# Patient Record
Sex: Female | Born: 1955 | Race: White | Hispanic: No | Smoking: Never smoker
Health system: Southern US, Community
[De-identification: ages and names within clinical notes are randomized; demographics above are authoritative.]

## PROBLEM LIST (undated history)

## (undated) DIAGNOSIS — G709 Myoneural disorder, unspecified: Secondary | ICD-10-CM

## (undated) DIAGNOSIS — H02409 Unspecified ptosis of unspecified eyelid: Secondary | ICD-10-CM

## (undated) DIAGNOSIS — M797 Fibromyalgia: Secondary | ICD-10-CM

## (undated) DIAGNOSIS — H47011 Ischemic optic neuropathy, right eye: Secondary | ICD-10-CM

## (undated) DIAGNOSIS — M5412 Radiculopathy, cervical region: Secondary | ICD-10-CM

## (undated) DIAGNOSIS — G7001 Myasthenia gravis with (acute) exacerbation: Secondary | ICD-10-CM

## (undated) DIAGNOSIS — K222 Esophageal obstruction: Secondary | ICD-10-CM

## (undated) DIAGNOSIS — M1990 Unspecified osteoarthritis, unspecified site: Secondary | ICD-10-CM

## (undated) DIAGNOSIS — F419 Anxiety disorder, unspecified: Secondary | ICD-10-CM

## (undated) DIAGNOSIS — H469 Unspecified optic neuritis: Secondary | ICD-10-CM

## (undated) DIAGNOSIS — G629 Polyneuropathy, unspecified: Secondary | ICD-10-CM

## (undated) DIAGNOSIS — D0510 Intraductal carcinoma in situ of unspecified breast: Secondary | ICD-10-CM

## (undated) DIAGNOSIS — M545 Low back pain, unspecified: Secondary | ICD-10-CM

## (undated) DIAGNOSIS — Z9289 Personal history of other medical treatment: Secondary | ICD-10-CM

## (undated) DIAGNOSIS — B159 Hepatitis A without hepatic coma: Secondary | ICD-10-CM

## (undated) DIAGNOSIS — R112 Nausea with vomiting, unspecified: Secondary | ICD-10-CM

## (undated) DIAGNOSIS — G43909 Migraine, unspecified, not intractable, without status migrainosus: Secondary | ICD-10-CM

## (undated) DIAGNOSIS — G4730 Sleep apnea, unspecified: Secondary | ICD-10-CM

## (undated) DIAGNOSIS — M4800 Spinal stenosis, site unspecified: Secondary | ICD-10-CM

## (undated) DIAGNOSIS — K219 Gastro-esophageal reflux disease without esophagitis: Secondary | ICD-10-CM

## (undated) DIAGNOSIS — R55 Syncope and collapse: Secondary | ICD-10-CM

## (undated) DIAGNOSIS — G8929 Other chronic pain: Secondary | ICD-10-CM

## (undated) DIAGNOSIS — Z9889 Other specified postprocedural states: Secondary | ICD-10-CM

## (undated) DIAGNOSIS — G7000 Myasthenia gravis without (acute) exacerbation: Secondary | ICD-10-CM

## (undated) HISTORY — DX: Fibromyalgia: M79.7

## (undated) HISTORY — DX: Unspecified optic neuritis: H46.9

## (undated) HISTORY — PX: ESOPHAGOGASTRODUODENOSCOPY (EGD) WITH ESOPHAGEAL DILATION: SHX5812

## (undated) HISTORY — PX: AUGMENTATION MAMMAPLASTY: SUR837

## (undated) HISTORY — DX: Myasthenia gravis with (acute) exacerbation: G70.01

## (undated) HISTORY — PX: CARPAL TUNNEL RELEASE: SHX101

## (undated) HISTORY — DX: Personal history of other medical treatment: Z92.89

## (undated) HISTORY — DX: Myoneural disorder, unspecified: G70.9

## (undated) HISTORY — DX: Esophageal obstruction: K22.2

## (undated) HISTORY — DX: Syncope and collapse: R55

## (undated) HISTORY — DX: Radiculopathy, cervical region: M54.12

## (undated) HISTORY — PX: BREAST BIOPSY: SHX20

## (undated) HISTORY — DX: Intraductal carcinoma in situ of unspecified breast: D05.10

## (undated) HISTORY — DX: Unspecified ptosis of unspecified eyelid: H02.409

## (undated) HISTORY — PX: BACK SURGERY: SHX140

## (undated) HISTORY — PX: MASTECTOMY: SHX3

---
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
## 1993-09-07 HISTORY — PX: INNER EAR SURGERY: SHX679

## 1997-09-07 HISTORY — PX: LUMBAR LAMINECTOMY: SHX95

## 1998-09-07 HISTORY — PX: LUMBAR LAMINECTOMY: SHX95

## 1998-11-12 ENCOUNTER — Other Ambulatory Visit: Admission: RE | Admit: 1998-11-12 | Discharge: 1998-11-12 | Payer: Self-pay | Admitting: Family Medicine

## 1998-12-26 ENCOUNTER — Ambulatory Visit (HOSPITAL_BASED_OUTPATIENT_CLINIC_OR_DEPARTMENT_OTHER): Admission: RE | Admit: 1998-12-26 | Discharge: 1998-12-26 | Payer: Self-pay

## 1999-02-21 ENCOUNTER — Inpatient Hospital Stay (HOSPITAL_COMMUNITY): Admission: RE | Admit: 1999-02-21 | Discharge: 1999-02-24 | Payer: Self-pay

## 1999-03-01 ENCOUNTER — Encounter: Payer: Self-pay | Admitting: Plastic Surgery

## 1999-03-01 ENCOUNTER — Emergency Department (HOSPITAL_COMMUNITY): Admission: EM | Admit: 1999-03-01 | Discharge: 1999-03-01 | Payer: Self-pay

## 1999-03-24 ENCOUNTER — Encounter: Admission: RE | Admit: 1999-03-24 | Discharge: 1999-04-25 | Payer: Self-pay | Admitting: Plastic Surgery

## 1999-05-30 ENCOUNTER — Ambulatory Visit (HOSPITAL_COMMUNITY): Admission: RE | Admit: 1999-05-30 | Discharge: 1999-05-30 | Payer: Self-pay | Admitting: Neurosurgery

## 1999-05-30 ENCOUNTER — Encounter: Payer: Self-pay | Admitting: Neurosurgery

## 1999-06-17 ENCOUNTER — Inpatient Hospital Stay (HOSPITAL_COMMUNITY): Admission: RE | Admit: 1999-06-17 | Discharge: 1999-06-18 | Payer: Self-pay | Admitting: Neurosurgery

## 1999-06-17 ENCOUNTER — Encounter: Payer: Self-pay | Admitting: Neurosurgery

## 1999-07-23 ENCOUNTER — Ambulatory Visit (HOSPITAL_BASED_OUTPATIENT_CLINIC_OR_DEPARTMENT_OTHER): Admission: RE | Admit: 1999-07-23 | Discharge: 1999-07-24 | Payer: Self-pay | Admitting: Plastic Surgery

## 1999-07-23 ENCOUNTER — Encounter (INDEPENDENT_AMBULATORY_CARE_PROVIDER_SITE_OTHER): Payer: Self-pay | Admitting: *Deleted

## 1999-11-25 ENCOUNTER — Other Ambulatory Visit: Admission: RE | Admit: 1999-11-25 | Discharge: 1999-11-25 | Payer: Self-pay | Admitting: Family Medicine

## 2000-01-29 ENCOUNTER — Encounter: Admission: RE | Admit: 2000-01-29 | Discharge: 2000-02-26 | Payer: Self-pay | Admitting: Neurosurgery

## 2000-08-05 ENCOUNTER — Encounter: Payer: Self-pay | Admitting: Family Medicine

## 2000-08-05 ENCOUNTER — Encounter: Admission: RE | Admit: 2000-08-05 | Discharge: 2000-08-05 | Payer: Self-pay | Admitting: Family Medicine

## 2000-08-16 ENCOUNTER — Encounter: Admission: RE | Admit: 2000-08-16 | Discharge: 2000-11-14 | Payer: Self-pay | Admitting: Emergency Medicine

## 2000-09-22 ENCOUNTER — Encounter: Payer: Self-pay | Admitting: Family Medicine

## 2000-09-22 ENCOUNTER — Encounter: Admission: RE | Admit: 2000-09-22 | Discharge: 2000-09-22 | Payer: Self-pay | Admitting: Family Medicine

## 2000-12-01 ENCOUNTER — Other Ambulatory Visit: Admission: RE | Admit: 2000-12-01 | Discharge: 2000-12-01 | Payer: Self-pay | Admitting: *Deleted

## 2001-01-18 ENCOUNTER — Ambulatory Visit (HOSPITAL_BASED_OUTPATIENT_CLINIC_OR_DEPARTMENT_OTHER): Admission: RE | Admit: 2001-01-18 | Discharge: 2001-01-18 | Payer: Self-pay | Admitting: Internal Medicine

## 2001-05-11 ENCOUNTER — Encounter: Admission: RE | Admit: 2001-05-11 | Discharge: 2001-06-21 | Payer: Self-pay | Admitting: Neurological Surgery

## 2001-12-02 ENCOUNTER — Other Ambulatory Visit: Admission: RE | Admit: 2001-12-02 | Discharge: 2001-12-02 | Payer: Self-pay | Admitting: *Deleted

## 2002-09-07 DIAGNOSIS — Z9289 Personal history of other medical treatment: Secondary | ICD-10-CM

## 2002-09-07 HISTORY — DX: Personal history of other medical treatment: Z92.89

## 2002-09-15 ENCOUNTER — Other Ambulatory Visit: Admission: RE | Admit: 2002-09-15 | Discharge: 2002-09-15 | Payer: Self-pay | Admitting: *Deleted

## 2003-02-19 ENCOUNTER — Emergency Department (HOSPITAL_COMMUNITY): Admission: EM | Admit: 2003-02-19 | Discharge: 2003-02-19 | Payer: Self-pay | Admitting: Emergency Medicine

## 2003-02-19 ENCOUNTER — Encounter: Payer: Self-pay | Admitting: Emergency Medicine

## 2003-09-28 ENCOUNTER — Other Ambulatory Visit: Admission: RE | Admit: 2003-09-28 | Discharge: 2003-09-28 | Payer: Self-pay | Admitting: *Deleted

## 2003-11-09 ENCOUNTER — Ambulatory Visit (HOSPITAL_COMMUNITY): Admission: RE | Admit: 2003-11-09 | Discharge: 2003-11-09 | Payer: Self-pay | Admitting: Podiatry

## 2004-05-21 ENCOUNTER — Ambulatory Visit (HOSPITAL_COMMUNITY): Admission: RE | Admit: 2004-05-21 | Discharge: 2004-05-21 | Payer: Self-pay | Admitting: Rheumatology

## 2004-07-22 ENCOUNTER — Emergency Department (HOSPITAL_COMMUNITY): Admission: EM | Admit: 2004-07-22 | Discharge: 2004-07-22 | Payer: Self-pay | Admitting: Emergency Medicine

## 2004-09-07 DIAGNOSIS — Z9289 Personal history of other medical treatment: Secondary | ICD-10-CM

## 2004-09-07 HISTORY — DX: Personal history of other medical treatment: Z92.89

## 2004-10-08 ENCOUNTER — Other Ambulatory Visit: Admission: RE | Admit: 2004-10-08 | Discharge: 2004-10-08 | Payer: Self-pay | Admitting: *Deleted

## 2005-05-06 ENCOUNTER — Emergency Department (HOSPITAL_COMMUNITY): Admission: EM | Admit: 2005-05-06 | Discharge: 2005-05-06 | Payer: Self-pay | Admitting: Emergency Medicine

## 2005-08-10 ENCOUNTER — Emergency Department (HOSPITAL_COMMUNITY): Admission: EM | Admit: 2005-08-10 | Discharge: 2005-08-10 | Payer: Self-pay | Admitting: Emergency Medicine

## 2005-12-03 ENCOUNTER — Ambulatory Visit (HOSPITAL_COMMUNITY): Admission: RE | Admit: 2005-12-03 | Discharge: 2005-12-03 | Payer: Self-pay | Admitting: Gastroenterology

## 2005-12-03 ENCOUNTER — Encounter: Payer: Self-pay | Admitting: Emergency Medicine

## 2005-12-03 ENCOUNTER — Ambulatory Visit: Payer: Self-pay | Admitting: Gastroenterology

## 2005-12-03 ENCOUNTER — Encounter (INDEPENDENT_AMBULATORY_CARE_PROVIDER_SITE_OTHER): Payer: Self-pay | Admitting: *Deleted

## 2005-12-23 ENCOUNTER — Ambulatory Visit: Payer: Self-pay | Admitting: Internal Medicine

## 2005-12-30 ENCOUNTER — Ambulatory Visit (HOSPITAL_COMMUNITY): Admission: RE | Admit: 2005-12-30 | Discharge: 2005-12-30 | Payer: Self-pay | Admitting: Internal Medicine

## 2006-12-21 ENCOUNTER — Emergency Department (HOSPITAL_COMMUNITY): Admission: EM | Admit: 2006-12-21 | Discharge: 2006-12-21 | Payer: Self-pay | Admitting: Emergency Medicine

## 2007-11-08 ENCOUNTER — Ambulatory Visit (HOSPITAL_BASED_OUTPATIENT_CLINIC_OR_DEPARTMENT_OTHER): Admission: RE | Admit: 2007-11-08 | Discharge: 2007-11-08 | Payer: Self-pay | Admitting: Orthopedic Surgery

## 2008-02-28 ENCOUNTER — Ambulatory Visit: Payer: Self-pay | Admitting: Internal Medicine

## 2008-03-18 ENCOUNTER — Emergency Department (HOSPITAL_COMMUNITY): Admission: EM | Admit: 2008-03-18 | Discharge: 2008-03-18 | Payer: Self-pay | Admitting: Emergency Medicine

## 2008-11-05 ENCOUNTER — Ambulatory Visit: Payer: Self-pay | Admitting: Internal Medicine

## 2008-11-13 ENCOUNTER — Telehealth: Payer: Self-pay | Admitting: Internal Medicine

## 2008-12-21 ENCOUNTER — Ambulatory Visit: Payer: Self-pay | Admitting: Internal Medicine

## 2008-12-25 ENCOUNTER — Encounter: Admission: RE | Admit: 2008-12-25 | Discharge: 2009-02-19 | Payer: Self-pay | Admitting: Neurosurgery

## 2009-01-21 ENCOUNTER — Encounter: Admission: RE | Admit: 2009-01-21 | Discharge: 2009-02-28 | Payer: Self-pay | Admitting: Emergency Medicine

## 2009-06-17 ENCOUNTER — Encounter: Admission: RE | Admit: 2009-06-17 | Discharge: 2009-07-15 | Payer: Self-pay | Admitting: Sports Medicine

## 2009-07-02 ENCOUNTER — Ambulatory Visit: Payer: Self-pay | Admitting: Genetic Counselor

## 2009-07-25 ENCOUNTER — Encounter: Admission: RE | Admit: 2009-07-25 | Discharge: 2009-07-25 | Payer: Self-pay | Admitting: Neurosurgery

## 2010-09-27 ENCOUNTER — Encounter: Payer: Self-pay | Admitting: Family Medicine

## 2010-09-28 ENCOUNTER — Encounter: Payer: Self-pay | Admitting: Podiatry

## 2010-12-10 ENCOUNTER — Other Ambulatory Visit: Payer: Self-pay | Admitting: Obstetrics and Gynecology

## 2010-12-29 ENCOUNTER — Emergency Department (HOSPITAL_COMMUNITY)
Admission: EM | Admit: 2010-12-29 | Discharge: 2010-12-29 | Disposition: A | Discharge status: Other Acute Inpatient Hospital | Payer: 59 | Attending: Emergency Medicine | Admitting: Emergency Medicine

## 2010-12-29 ENCOUNTER — Emergency Department (HOSPITAL_COMMUNITY): Payer: 59

## 2010-12-29 DIAGNOSIS — R1310 Dysphagia, unspecified: Secondary | ICD-10-CM

## 2010-12-29 DIAGNOSIS — R079 Chest pain, unspecified: Secondary | ICD-10-CM | POA: Insufficient documentation

## 2010-12-29 DIAGNOSIS — K222 Esophageal obstruction: Secondary | ICD-10-CM

## 2010-12-29 DIAGNOSIS — R1013 Epigastric pain: Secondary | ICD-10-CM | POA: Insufficient documentation

## 2010-12-29 DIAGNOSIS — Z853 Personal history of malignant neoplasm of breast: Secondary | ICD-10-CM | POA: Insufficient documentation

## 2010-12-29 LAB — COMPREHENSIVE METABOLIC PANEL
ALT: 13 U/L (ref 0–35)
AST: 17 U/L (ref 0–37)
Albumin: 3.6 g/dL (ref 3.5–5.2)
Alkaline Phosphatase: 39 U/L (ref 39–117)
BUN: 18 mg/dL (ref 6–23)
CO2: 24 mEq/L (ref 19–32)
Calcium: 9.1 mg/dL (ref 8.4–10.5)
Chloride: 105 mEq/L (ref 96–112)
Creatinine, Ser: 0.63 mg/dL (ref 0.4–1.2)
GFR calc Af Amer: >60 mL/min (ref >60)
GFR calc non Af Amer: >60 mL/min (ref >60)
Glucose, Bld: 90 mg/dL (ref 70–99)
Potassium: 3.6 mEq/L (ref 3.5–5.1)
Sodium: 137 mEq/L (ref 135–145)
Total Bilirubin: 0.5 mg/dL (ref 0.3–1.2)
Total Protein: 6.5 g/dL (ref 6.0–8.3)

## 2010-12-29 LAB — DIFFERENTIAL
Basophils Absolute: 0 10*3/uL (ref 0.0–0.1)
Basophils Relative: 1 % (ref 0–1)
Eosinophils Absolute: 0.2 10*3/uL (ref 0.0–0.7)
Eosinophils Relative: 4 % (ref 0–5)
Lymphocytes Relative: 35 % (ref 12–46)
Lymphs Abs: 2.2 10*3/uL (ref 0.7–4.0)
Monocytes Absolute: 0.8 10*3/uL (ref 0.1–1.0)
Monocytes Relative: 13 % — ABNORMAL HIGH (ref 3–12)
Neutro Abs: 3 10*3/uL (ref 1.7–7.7)
Neutrophils Relative %: 47 % (ref 43–77)

## 2010-12-29 LAB — CBC
HCT: 37.4 % (ref 36.0–46.0)
Hemoglobin: 12.6 g/dL (ref 12.0–15.0)
MCH: 29.9 pg (ref 26.0–34.0)
MCHC: 33.7 g/dL (ref 30.0–36.0)
MCV: 88.6 fL (ref 78.0–100.0)
Platelets: 242 10*3/uL (ref 150–400)
RBC: 4.22 MIL/uL (ref 3.87–5.11)
RDW: 12.3 % (ref 11.5–15.5)
WBC: 6.3 10*3/uL (ref 4.0–10.5)

## 2010-12-29 LAB — POCT CARDIAC MARKERS
CKMB, poc: <1 ng/mL — ABNORMAL LOW (ref 1.0–8.0)
Myoglobin, poc: 39.4 ng/mL (ref 12–200)
Troponin i, poc: <0.05 ng/mL (ref 0.00–0.09)

## 2010-12-29 LAB — LIPASE, BLOOD: Lipase: 32 U/L (ref 11–59)

## 2011-01-20 ENCOUNTER — Ambulatory Visit (HOSPITAL_COMMUNITY)
Admission: RE | Admit: 2011-01-20 | Discharge: 2011-01-20 | Disposition: A | Discharge status: Home / Self Care | Payer: 59 | Source: Ambulatory Visit | Attending: Obstetrics and Gynecology | Admitting: Obstetrics and Gynecology

## 2011-01-20 ENCOUNTER — Other Ambulatory Visit: Payer: Self-pay | Admitting: Obstetrics and Gynecology

## 2011-01-20 DIAGNOSIS — N950 Postmenopausal bleeding: Secondary | ICD-10-CM | POA: Insufficient documentation

## 2011-01-20 DIAGNOSIS — N841 Polyp of cervix uteri: Secondary | ICD-10-CM | POA: Insufficient documentation

## 2011-01-20 DIAGNOSIS — N840 Polyp of corpus uteri: Secondary | ICD-10-CM | POA: Insufficient documentation

## 2011-01-20 LAB — CBC
HCT: 38.5 % (ref 36.0–46.0)
Hemoglobin: 12.9 g/dL (ref 12.0–15.0)
MCV: 90.2 fL (ref 78.0–100.0)
Platelets: 221 10*3/uL (ref 150–400)
RBC: 4.27 MIL/uL (ref 3.87–5.11)
WBC: 7.4 10*3/uL (ref 4.0–10.5)

## 2011-01-20 NOTE — Op Note (Signed)
[name], Felicia Cain            ACCOUNT NO.:  [id number]   MEDICAL RECORD NO.:  [id number]          PATIENT TYPE:  AMB   LOCATION:  DSC                          FACILITY:  [facility]   PHYSICIAN:  Cindee Salt, M.D.       DATE OF BIRTH:  May 26, 1956   DATE OF PROCEDURE:  11/08/2007  DATE OF DISCHARGE:                               OPERATIVE REPORT   PREOPERATIVE DIAGNOSIS:  Carpal tunnel syndrome, right hand.   POSTOPERATIVE DIAGNOSIS:  Carpal tunnel syndrome, right hand.   OPERATION:  Decompression, right median nerve.   SURGEON:  Cindee Salt, M.D.   ASSISTANTCarolyne Fiscal, RN   ANESTHESIA:  Forearm based IV regional.   HISTORY:  The patient is a 56 year old female with a history of carpal  tunnel syndrome, EMG nerve conduction positive which has not responded  to conservative treatment.  She has elected to undergo release of the  median nerve. Preoperative and postoperative course been discussed.  Risks and complications are made aware to the patient.  She is aware  that there is no guarantee with the surgery, possibility of infection,  recurrence, injury to arteries, nerves, tendons, complete relief of  symptoms, dystrophy.  In the preoperative area the patient is seen,  questions again encouraged and answered.  The extremity marked by both  the patient and surgeon.  Antibiotic given.   PROCEDURE:  The patient is brought to the operating room where forearm-  based IV regional anesthetic was carried out without difficulty.  She  was prepped using DuraPrep, supine position, right arm free.  A  longitudinal incision was made in year palm, carried down through  subcutaneous tissue.  Bleeders were electrocauterized. Palmar fascia was  split, superficial palmar arch identified, the flexor tendon to the ring  and little finger identified to the ulna section.  A right-angle and  Sewall retractors were placed between skin and forearm fascia.  Fascia  was released for approximately 1.5 cm  proximal to the wrist crease under  direct vision.  The canal was explored.  Area compression of the nerve  was apparent.  No further lesions were identified.  The wound was  irrigated.  Skin closed with interrupted 5-0 Vicryl Rapide sutures.  A  sterile compressive dressing and splint with the fingers free were  applied.  The patient tolerated the procedure well was taken to the  recovery room for observation in satisfactory condition.  She will be  discharged home to return to the [facility] in 1 week on  [place] __________.           ______________________________  Cindee Salt, M.D.     GK/MEDQ  D:  11/08/2007  T:  11/08/2007  Job:  [id number]   cc:   Otilio Connors. Gerri Spore, M.D.
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
## 2011-01-24 NOTE — Op Note (Signed)
  [name], Felicia Cain            ACCOUNT NO.:  [id number]  MEDICAL RECORD NO.:  [id number]           PATIENT TYPE:  O  LOCATION:  [facility]                          [facility]  PHYSICIAN:  Maxie Better, M.D.DATE OF BIRTH:  10-21-1955  DATE OF PROCEDURE:  01/20/2011 DATE OF DISCHARGE:                              OPERATIVE REPORT   PREOPERATIVE DIAGNOSES:  Postmenopausal bleeding, endometrial mass.  POSTOPERATIVE DIAGNOSES:  Postmenopausal bleeding, endometrial and endocervical polyp.  PROCEDURES:  Diagnostic hysteroscopy, dilation and curettage, removal of endometrial polyp and then the endocervical polyp.  ANESTHESIA:  General, paracervical block.  SURGEON:  Maxie Better, MD  PROCEDURE:  Under adequate general anesthesia, the patient was placed in the dorsal lithotomy position.  Examination under anesthesia revealed anteverted uterus.  No adnexal masses could be appreciated.  The patient had voided prior to entering the room.  Therefore, the bladder was not catheterized.  She was sterilely prepped and draped in usual fashion. The bivalve speculum was placed in the vagina.  A single-tooth tenaculum was placed on the anterior lip of the cervix.  A 20 mL of 1% Nesacaine was injected paracervically at 3 and 9 o'clock position.  The cervix was serially dilated up to #23 [facility] dilator.  A diagnostic hysteroscope was introduced into the uterine cavity.  Both tubal ostia could be seen. There was a broad-based polypoid lesion noted in the anterior fundal area.  The hysteroscope was removed.  The cervix was then attempted to be serially dilated to #25 [facility] dilator.  A resectoscope was attempted to be inserted into the cavity, but could not traverse the external os. Decision was then made to reinsert the diagnostic hysteroscope and used a grasper and scissors, both of which through that site to try to remove this lesion.  In the interim time, the polyp forceps was  utilized and the polypoid lesion was removed in pieces.  The hysteroscope was then removed, the cavity was then gently curetted for scant amount of tissue. On removal of the hysteroscope, a polypoid lesion was noted in the cervical canal and this was also removed using the grasper.  The procedure was then terminated by removing all instruments.  Specimen was endometrial curettings and polyps sent to pathology.  Estimated blood loss was minimal.  Complication was none.  The patient tolerated the procedure well and was transferred to the recovery room in stable condition.     Maxie Better, M.D.     [place]/MEDQ  D:  01/20/2011  T:  01/21/2011  Job:  [id number]  Electronically Signed by Nena Jordan Elisama Thissen M.D. on 01/24/2011 06:48:56 PM

## 2011-05-28 ENCOUNTER — Telehealth: Payer: Self-pay | Admitting: Internal Medicine

## 2011-05-28 NOTE — Telephone Encounter (Signed)
Pt has had problems with dysphagia in the past. Most recently had and egd with dil in April 2012 with Dr. Christella Hartigan. Pt had a screening colon in April of 2010 with Dr. Juanda Chance.Pt is calling with c/o feeling full with very little food. She states that she gets this full sensation and then the food comes back up her throat to her mouth and she has to spit it out. States it has gotten worse this last week. States she has not experienced weight loss because she is eating frequent, small amounts.Offered pt an appt to see Dr. Mithra Goodell tomorrow at 1:45pm but she states she has another md appt at 2pm. Pt would like to know if Dr. Teaira Goodell could work her in on Tuesday 06/02/11. Dr. Rayni Goodell please advise.

## 2011-05-29 ENCOUNTER — Ambulatory Visit: Payer: 59 | Admitting: Internal Medicine

## 2011-05-29 NOTE — Telephone Encounter (Signed)
appt w/ amy tues (I'm supervising)

## 2011-05-29 NOTE — Telephone Encounter (Signed)
Pt scheduled to see Amy Esterwood 06/02/11@10 :30am, pt aware of appt date and time.

## 2011-06-02 ENCOUNTER — Ambulatory Visit: Payer: 59 | Admitting: Physician Assistant

## 2011-08-16 ENCOUNTER — Encounter: Payer: Self-pay | Admitting: *Deleted

## 2011-08-16 ENCOUNTER — Emergency Department (HOSPITAL_COMMUNITY)
Admission: EM | Admit: 2011-08-16 | Discharge: 2011-08-16 | Disposition: A | Discharge status: Home / Self Care | Payer: 59 | Attending: Emergency Medicine | Admitting: Emergency Medicine

## 2011-08-16 DIAGNOSIS — M21619 Bunion of unspecified foot: Secondary | ICD-10-CM | POA: Insufficient documentation

## 2011-08-16 DIAGNOSIS — M79676 Pain in unspecified toe(s): Secondary | ICD-10-CM

## 2011-08-16 DIAGNOSIS — M79609 Pain in unspecified limb: Secondary | ICD-10-CM | POA: Insufficient documentation

## 2011-08-16 HISTORY — DX: Unspecified optic neuritis: H46.9

## 2011-08-16 HISTORY — DX: Spinal stenosis, site unspecified: M48.00

## 2011-08-16 HISTORY — DX: Polyneuropathy, unspecified: G62.9

## 2011-08-16 MED ORDER — OXYCODONE-ACETAMINOPHEN 5-325 MG PO TABS: 1.0000 | ORAL_TABLET | Freq: Four times a day (QID) | ORAL | Status: AC | PRN

## 2011-08-16 MED ORDER — PREDNISONE 20 MG PO TABS: 40.0000 mg | ORAL_TABLET | Freq: Every day | ORAL | Status: AC

## 2011-08-16 MED ORDER — HYDROCODONE-ACETAMINOPHEN 5-325 MG PO TABS: ORAL_TABLET | ORAL | Status: DC

## 2011-08-16 NOTE — ED Provider Notes (Signed)
History     CSN: [id number] Arrival date & time: 08/16/2011 11:13 AM   First MD Initiated Contact with Patient 08/16/11 1123      No chief complaint on file.   (Consider location/radiation/quality/duration/timing/severity/associated sxs/prior treatment) HPI Comments: Patient with onset and worsening of L toe pain since several days ago. She denies injury. She is having trouble walking. She's been using Motrin at home. She seen a podiatrist and had negative x-rays done. She denies fever or redness streaking up her leg.   Patient is a 55 y.o. female presenting with toe pain. The history is provided by the patient.  Toe Pain This is a new problem. The current episode started in the past 7 days. The problem occurs constantly. The problem has been gradually worsening. Associated symptoms include arthralgias. Pertinent negatives include no fever, joint swelling, nausea, numbness or weakness. The symptoms are aggravated by bending and walking. She has tried NSAIDs for the symptoms. The treatment provided mild relief.    No past medical history on file.  No past surgical history on file.  No family history on file.  History  Substance Use Topics  . Smoking status: Not on file  . Smokeless tobacco: Not on file  . Alcohol Use: Not on file    OB History    No data available      Review of Systems  Constitutional: Negative for fever.  Gastrointestinal: Negative for nausea.  Musculoskeletal: Positive for arthralgias. Negative for joint swelling.  Skin: Negative for color change and wound.  Neurological: Negative for weakness and numbness.    Allergies  Review of patient's allergies indicates not on file.  Home Medications  No current outpatient prescriptions on file.  BP 123/66  Pulse 104  Temp(Src) 98.6 F (37 C) (Oral)  Resp 18  SpO2 99%  Physical Exam  Nursing note and vitals reviewed. Constitutional: She is oriented to person, place, and time. She appears  well-developed and well-nourished.  HENT:  Head: Normocephalic and atraumatic.  Eyes: Pupils are equal, round, and reactive to light.  Neck: Normal range of motion. Neck supple.  Musculoskeletal: She exhibits no edema and no tenderness.  Neurological: She is alert and oriented to person, place, and time.       Distal motor, sensation, and vascular intact. Patients with diffuse pain of left great toe, especially over the flexor surface. There is no swelling or significant erythema. Skin is warm to touch. Patient does have a bunion on the left lateral aspect of the MTP joint.  Skin: Skin is warm and dry. No erythema.  Psychiatric: She has a normal mood and affect. Her behavior is normal.    ED Course  Procedures (including critical care time)  Labs Reviewed - No data to display No results found.   1. Pain in toe     11:40 AM Patient seen and examined.   Patient was seen with Dr. Oletta Lamas. Orthopedic shoe provided by orthopedic tech. Patient prescribed pain medicine and steroids. I have personally spoken with Dr. Sherlean Foot who states that patient should go to the office tomorrow where her orthopedic doctor will be seeing patients. Patient informed and agrees with plan. MDM  Patient with toe pain. Do not suspect gout. Likely tendon related given exam. Orthopedic follow indicated.        Eustace Moore [place], [place] 08/16/11 [phone]

## 2011-08-16 NOTE — ED Notes (Signed)
Pt c/o left great toes pain, swelling, redness x5days; seen podiatrist Friday advised to increase motring to tid, ice, elevation and sleeve; states most treatment worsens pain;

## 2011-08-17 NOTE — ED Provider Notes (Signed)
I have personally performed and participated in all the services and procedures documented herein. I have reviewed the findings with the patient. Pt with tenderness primarily to first phalanx of right foot without obv swelling, erythema, areas of fluctuance.  Much more severe with movement of MTP joint consistent with gout or pseudogout.  Pt has both an orthopedist and a podiatrist.  Explained that MRI was not indicated at this time.  Instead continued treatment with NSAIDs, or in this case prednisone and stronger analgesics.  PAC discussed with her orthopedic group so that she could be rechecked in a timely fashion.    Felicia Cain. Giavanni Zeitlin, MD 08/17/11 1150

## 2011-10-01 ENCOUNTER — Ambulatory Visit: Payer: 59 | Admitting: Internal Medicine

## 2011-12-08 ENCOUNTER — Ambulatory Visit: Payer: BC Managed Care – PPO | Attending: Sports Medicine

## 2012-06-02 ENCOUNTER — Ambulatory Visit (INDEPENDENT_AMBULATORY_CARE_PROVIDER_SITE_OTHER): Payer: BC Managed Care – PPO | Admitting: Sports Medicine

## 2012-06-02 ENCOUNTER — Encounter: Payer: Self-pay | Admitting: Sports Medicine

## 2012-06-02 VITALS — BP 116/78 | HR 73 | Ht 66.0 in | Wt 150.0 lb

## 2012-06-02 DIAGNOSIS — S93409A Sprain of unspecified ligament of unspecified ankle, initial encounter: Secondary | ICD-10-CM

## 2012-06-02 DIAGNOSIS — M25579 Pain in unspecified ankle and joints of unspecified foot: Secondary | ICD-10-CM | POA: Insufficient documentation

## 2012-06-02 NOTE — Patient Instructions (Addendum)
It was good to see you today Make sure you get the x-rays from [facility] and drop them off for me to review Try to increase your Neurontin to 3 times daily if tolerable Use the body helix ankle wrap when walking See me again in 3 weeks. I will contact you after reviewing her x-rays. I may need to send you for repeat films, depending on what the original x-ray showed. Feel free to call me if you have any questions or concerns before your followup visit  Toe your mother happy birthday for me!
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
## 2012-06-06 NOTE — Addendum Note (Signed)
Addended by: Annita Brod on: 06/06/2012 03:09 PM   Modules accepted: Orders

## 2012-06-08 ENCOUNTER — Ambulatory Visit
Admission: RE | Admit: 2012-06-08 | Discharge: 2012-06-08 | Disposition: A | Discharge status: Home / Self Care | Payer: BC Managed Care – PPO | Source: Ambulatory Visit | Attending: Sports Medicine | Admitting: Sports Medicine

## 2012-06-08 ENCOUNTER — Telehealth: Payer: Self-pay | Admitting: Sports Medicine

## 2012-06-08 DIAGNOSIS — S93409A Sprain of unspecified ligament of unspecified ankle, initial encounter: Secondary | ICD-10-CM

## 2012-06-08 DIAGNOSIS — M25579 Pain in unspecified ankle and joints of unspecified foot: Secondary | ICD-10-CM

## 2012-06-08 NOTE — Telephone Encounter (Signed)
I reviewed the x-rays from [facility]. No obvious ankle fracture but dedicated foot x-rays were not done. Given her tenderness and deformity at the base of the fifth metatarsal I want to order a dedicated x-rays of her foot. I will order an AP, lateral, and oblique at [facility] imaging and will followup with her via telephone once I reviewed those films.

## 2012-06-21 ENCOUNTER — Ambulatory Visit (INDEPENDENT_AMBULATORY_CARE_PROVIDER_SITE_OTHER): Payer: BC Managed Care – PPO | Admitting: Sports Medicine

## 2012-06-21 VITALS — BP 100/60 | Ht 66.0 in | Wt 150.0 lb

## 2012-06-21 DIAGNOSIS — S93409A Sprain of unspecified ligament of unspecified ankle, initial encounter: Secondary | ICD-10-CM

## 2012-06-21 DIAGNOSIS — S838X9A Sprain of other specified parts of unspecified knee, initial encounter: Secondary | ICD-10-CM

## 2012-06-21 DIAGNOSIS — S86819A Strain of other muscle(s) and tendon(s) at lower leg level, unspecified leg, initial encounter: Secondary | ICD-10-CM

## 2012-06-21 DIAGNOSIS — S86319A Strain of muscle(s) and tendon(s) of peroneal muscle group at lower leg level, unspecified leg, initial encounter: Secondary | ICD-10-CM

## 2012-06-21 MED ORDER — NITROGLYCERIN 0.2 MG/HR TD PT24: MEDICATED_PATCH | TRANSDERMAL | Status: DC

## 2012-06-21 NOTE — Progress Notes (Signed)
  Subjective:    Patient ID: Felicia Cain, female    DOB: 24-Jul-1956, 56 y.o.   MRN: [id number]  HPI Patient comes in today for followup on her left ankle pain. I reviewed the x-rays from [facility]. No obvious ankle fracture. I also ordered x-rays of her left foot which showed no new avulsion fracture. She still having pain along the anterior and lateral aspect of the ankle. Some swelling at the end of the day. Her inversion injury occurred one month ago. She's wearing her body helix ankle sleeve and this does help some. She notes that when she wears a shoe with an elevated heel that this helped her pain tremendously. She was unable to tolerate increasing her Neurontin to 3 times daily. She is now back to taking it twice a day.     Review of Systems     Objective:   Physical Exam Well-developed, well-nourished. No acute distress. Left ankle: She has full range of motion with no obvious ankle effusion. No significant soft tissue swelling. She is diffusely tender to palpation along the course of the peroneal tendons both the hind the distal fibula as well as at the insertion onto the base of the fifth metatarsal. She has pain with foot eversion and with stretching of the peroneal tendon. She is tender to palpation at the ATF. Still with a positive anterior drawer and a 2+ talar tilt. She is neurovascular intact distally and walking with a limp.  [facility] ultrasound of the left ankle: Images were obtained both in the transverse and longitudinal views. There is fluid in the tendon sheaths of both the peroneal longus and brevis tendons, a little more in the longus. There is bony  irregularity at the base of the fifth metatarsal and there may be a small tear in the peroneal brevis tendon just proximal to its insertion. She has increased neovascularity throughout the tendons.       Assessment & Plan:  1. Left ankle pain secondary to ankle sprain with probable strain/partial tear of the peroneal  tendons  We will try a trial of topical nitroglycerin. She will apply a quarter patch daily. She will start eccentric exercises. I've also given her a prescription for formal physical therapy for her use at her discretion. She will continue with the body helix compression sleeve and followup with me in 4 weeks. We will repeat her ultrasound at that time. Since she does get some relief with heel elevation, I've given her a pair of sports insoles with bilateral heel lifts to wear in her shoes. At this point in time I think we can hold on further diagnostic image but if symptoms persist or worsen we may need to reconsider that.

## 2012-06-23 ENCOUNTER — Ambulatory Visit: Payer: 59 | Admitting: Internal Medicine

## 2012-06-23 ENCOUNTER — Ambulatory Visit: Payer: BC Managed Care – PPO | Admitting: Sports Medicine

## 2012-06-23 ENCOUNTER — Ambulatory Visit (INDEPENDENT_AMBULATORY_CARE_PROVIDER_SITE_OTHER): Payer: BC Managed Care – PPO | Admitting: Internal Medicine

## 2012-06-23 ENCOUNTER — Encounter: Payer: Self-pay | Admitting: Internal Medicine

## 2012-06-23 VITALS — BP 110/68 | HR 71 | Temp 97.5°F | Resp 16 | Ht 66.0 in | Wt 152.0 lb

## 2012-06-23 DIAGNOSIS — Z9013 Acquired absence of bilateral breasts and nipples: Secondary | ICD-10-CM

## 2012-06-23 DIAGNOSIS — K219 Gastro-esophageal reflux disease without esophagitis: Secondary | ICD-10-CM

## 2012-06-23 DIAGNOSIS — Z9889 Other specified postprocedural states: Secondary | ICD-10-CM

## 2012-06-23 DIAGNOSIS — D0510 Intraductal carcinoma in situ of unspecified breast: Secondary | ICD-10-CM

## 2012-06-23 DIAGNOSIS — L719 Rosacea, unspecified: Secondary | ICD-10-CM

## 2012-06-23 DIAGNOSIS — E559 Vitamin D deficiency, unspecified: Secondary | ICD-10-CM

## 2012-06-23 DIAGNOSIS — Z9010 Acquired absence of unspecified breast and nipple: Secondary | ICD-10-CM

## 2012-06-23 DIAGNOSIS — C50919 Malignant neoplasm of unspecified site of unspecified female breast: Secondary | ICD-10-CM

## 2012-06-23 DIAGNOSIS — Z780 Asymptomatic menopausal state: Secondary | ICD-10-CM

## 2012-06-23 DIAGNOSIS — D0590 Unspecified type of carcinoma in situ of unspecified breast: Secondary | ICD-10-CM

## 2012-06-23 DIAGNOSIS — Z8669 Personal history of other diseases of the nervous system and sense organs: Secondary | ICD-10-CM

## 2012-06-23 DIAGNOSIS — Z1371 Encounter for nonprocreative screening for genetic disease carrier status: Secondary | ICD-10-CM

## 2012-06-23 LAB — CBC WITH DIFFERENTIAL/PLATELET
Eosinophils Absolute: 0.2 10*3/uL (ref 0.0–0.7)
HCT: 39.7 % (ref 36.0–46.0)
Hemoglobin: 13.6 g/dL (ref 12.0–15.0)
Lymphs Abs: 2.3 10*3/uL (ref 0.7–4.0)
MCH: 30.2 pg (ref 26.0–34.0)
MCV: 88.2 fL (ref 78.0–100.0)
Monocytes Absolute: 0.9 10*3/uL (ref 0.1–1.0)
Monocytes Relative: 14 % — ABNORMAL HIGH (ref 3–12)
Neutrophils Relative %: 49 % (ref 43–77)
RBC: 4.5 MIL/uL (ref 3.87–5.11)

## 2012-06-23 LAB — COMPREHENSIVE METABOLIC PANEL
Albumin: 4.3 g/dL (ref 3.5–5.2)
CO2: 26 mEq/L (ref 19–32)
Glucose, Bld: 91 mg/dL (ref 70–99)
Sodium: 137 mEq/L (ref 135–145)
Total Bilirubin: 0.3 mg/dL (ref 0.3–1.2)
Total Protein: 6.6 g/dL (ref 6.0–8.3)

## 2012-06-23 LAB — LIPID PANEL
Cholesterol: 182 mg/dL (ref 0–200)
Triglycerides: 73 mg/dL (ref <150)
VLDL: 15 mg/dL (ref 0–40)

## 2012-06-23 NOTE — Progress Notes (Signed)
Subjective:    Patient ID: Felicia Cain, female    DOB: October 03, 1955, 56 y.o.   MRN: [id number]  HPI  Felicia Cain is here as a new pt to establish care.  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  Felicia Cain has been worked up for MS with reported normal brain MRI and equivocal spinal tap.  Most recently  Felicia Cain had  A single fiber EMG by Dr. Daphine Deutscher at [facility].  AS of now working diagnosis is possible myasthenia gravis and Felicia Cain is on empiric trial of Mestinon.  Felicia Cain takes 30 mg bid and this seems to help her droopy lid and does give her more energy  Felicia Cain does describe lots of job stress and wonder if Felicia Cain should go up on her Zoloft dose of 25 mg.   Denies daily depression,  No S/H ideation  Allergies  Allergen Reactions  . Latex   . Tape    Past Medical History  Diagnosis Date  . Cancer   . Spinal stenosis   . Optic neuropathy   . Neuropathy    Past Surgical History  Procedure Date  . Back surgery    History   Social History  . Marital Status: Widowed    Spouse Name: N/A    Number of Children: N/A  . Years of Education: N/A   Occupational History  . Not on file.   Social History Main Topics  . Smoking status: Never Smoker   . Smokeless tobacco: Not on file  . Alcohol Use: No  . Drug Use: No  . Sexually Active:    Other Topics Concern  . Not on file   Social History Narrative  . No narrative on file   No family history on file. Patient Active Problem List  Diagnosis  . Ankle pain   Current Outpatient Prescriptions on File Prior to Visit  Medication Sig Dispense Refill  . aspirin 81 MG tablet Take 81 mg by mouth daily.        . Calcium Carbonate (CALCIUM 600 PO) Take 1,200 mg by mouth 3 (three) times daily.      Marland Kitchen doxycycline (VIBRAMYCIN) 100 MG capsule Take  100 mg by mouth daily.      Marland Kitchen gabapentin (NEURONTIN) 300 MG capsule Take 300 mg by mouth 2 (two) times daily.        . nitroGLYCERIN (NITRODUR - DOSED IN MG/24 HR) 0.2 mg/hr Use 1/4 patch every 24 hours  30 patch  0  . Omega-3 Fatty Acids (FISH OIL) 1200 MG CAPS Take 1,200 mg by mouth 2 (two) times daily.      Marland Kitchen pyridostigmine (MESTINON) 60 MG tablet Take 30 mg by mouth Three times a day.      . sertraline (ZOLOFT) 25 MG tablet Take 25 mg by mouth daily.           Review of Systems See HPI    Objective:   Physical Exam Physical Exam  Nursing note and vitals reviewed.  Constitutional: Felicia Cain is oriented to person, place, and time. Felicia Cain appears well-developed and well-nourished.  HENT:  Head: Normocephalic and atraumatic.  Cardiovascular: Normal rate and regular rhythm. Exam reveals no gallop and no friction rub.  No murmur heard.  Pulmonary/Chest: Breath sounds normal. Felicia Cain has no  wheezes. Felicia Cain has no rales.  Neurological: Felicia Cain is alert and oriented to person, place, and time.  Skin: Skin is warm and dry.  Psychiatric: Felicia Cain has a normal mood and affect. Her behavior is normal.             Assessment & Plan:  Situational stress/ situational depression  OK to increase dose of Zoloft to 50 mg daily.  Felicia Cain is to return to see in in 4-6 weeks.  Will get labs today  History of DCIS  S/P bilateral mastectomy  Historyof optic neuritis/atrophy  Managed by Dr. Daphine Deutscher of [facility]  Possible myasthenia gravis.  On empiric Mestinon which seems to be helping for now  Rosacea  Menopause  See me return 4-6 weeks and schedule CPE

## 2012-06-24 LAB — TSH: TSH: 0.563 u[IU]/mL (ref 0.350–4.500)

## 2012-06-24 LAB — VITAMIN D 25 HYDROXY (VIT D DEFICIENCY, FRACTURES): Vit D, 25-Hydroxy: 41 ng/mL (ref 30–89)

## 2012-06-26 ENCOUNTER — Encounter: Payer: Self-pay | Admitting: Internal Medicine

## 2012-06-26 DIAGNOSIS — D0510 Intraductal carcinoma in situ of unspecified breast: Secondary | ICD-10-CM | POA: Insufficient documentation

## 2012-06-26 DIAGNOSIS — Z780 Asymptomatic menopausal state: Secondary | ICD-10-CM | POA: Insufficient documentation

## 2012-06-26 DIAGNOSIS — K219 Gastro-esophageal reflux disease without esophagitis: Secondary | ICD-10-CM | POA: Insufficient documentation

## 2012-06-26 DIAGNOSIS — Z1371 Encounter for nonprocreative screening for genetic disease carrier status: Secondary | ICD-10-CM | POA: Insufficient documentation

## 2012-06-26 DIAGNOSIS — Z9013 Acquired absence of bilateral breasts and nipples: Secondary | ICD-10-CM | POA: Insufficient documentation

## 2012-06-27 ENCOUNTER — Encounter: Payer: Self-pay | Admitting: *Deleted

## 2012-06-27 DIAGNOSIS — Z8669 Personal history of other diseases of the nervous system and sense organs: Secondary | ICD-10-CM | POA: Insufficient documentation

## 2012-06-27 DIAGNOSIS — L719 Rosacea, unspecified: Secondary | ICD-10-CM | POA: Insufficient documentation

## 2012-06-27 DIAGNOSIS — Z9889 Other specified postprocedural states: Secondary | ICD-10-CM | POA: Insufficient documentation

## 2012-07-06 ENCOUNTER — Ambulatory Visit (INDEPENDENT_AMBULATORY_CARE_PROVIDER_SITE_OTHER): Payer: BC Managed Care – PPO | Admitting: Family Medicine

## 2012-07-06 VITALS — BP 110/60 | Ht 66.0 in | Wt 152.0 lb

## 2012-07-06 DIAGNOSIS — G90529 Complex regional pain syndrome I of unspecified lower limb: Secondary | ICD-10-CM | POA: Insufficient documentation

## 2012-07-06 DIAGNOSIS — M25579 Pain in unspecified ankle and joints of unspecified foot: Secondary | ICD-10-CM

## 2012-07-06 NOTE — Progress Notes (Signed)
  Subjective:    Patient ID: Felicia Cain, female    DOB: 10-22-1955, 56 y.o.   MRN: [id number]  HPI Felicia Cain is here today for f/u of her left ankle pain. Really and treatment for ankle sprain and peroneal tendinitis.  Past medical history significant for more than 2 episodes of complex regional pain syndrome.   She reports that she was doing very well until 2 days ago.  At that time, she started having a different pain, described as sharp going right through the joint.  Also with some pain along the proximal peroneal tendons but no swelling.  Also describes extreme pain with light touch just anterior to the lateral malleolus.  She states this pain is consistent with prior episodes of complex regional pain syndrome.  She denies any redness or swelling to her ankle or foot.  Taking gabapentin 300mg  BID.    Past medical history: Complex regional pain syndrome as noted above  Review of Systems As stated in history of present illness    Objective:   Physical Exam Gen: alert, cooperative Left ankle:  No swelling or erythema + anterior drawer and talar tilt Exquisitely tender to light touch over antero-lateral ankle Normal sensation and capillary refill and pulses distally     Assessment & Plan:

## 2012-07-06 NOTE — Assessment & Plan Note (Addendum)
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  Given hypersensitivity and prior injury, this is likely RSD.  Will try B vitamins as that has worked in the past.  Will also increase gabapentin to 300mg  qAM and 600mg  qHS.   Followup in 4 weeks.

## 2012-07-06 NOTE — Assessment & Plan Note (Signed)
Recent ankle sprain that involved peroneal tendons.  Much improved from previous.  Still with some pain in the proximal peroneal tendons.  Does have joint instability as well.  Exercises to work on strength and proprioception given.  Follow up in 4 weeks.  Could consider MRI and/or referral to Dr. Vear Clock if needed.

## 2012-07-06 NOTE — Patient Instructions (Addendum)
Thank you for coming in today.  Increase gabapentin to 600mg  at night and 300mg  in the morning.   Metinax is  B9 L-methylfolate (Metafolin): 3 mg  B6  Pyridoxal 5'-phosphate: 35 mg  B12 Methylcobalamin: 2 mg   If that worked in the past try to create it yourself at the pharmacy.   Consider home PT/ exercises.   Don't wear the compression if if makes you worse.   Come back in 4 weeks or sooner if needed.

## 2012-07-11 ENCOUNTER — Ambulatory Visit: Payer: Self-pay | Admitting: Internal Medicine

## 2012-07-25 ENCOUNTER — Ambulatory Visit: Payer: BC Managed Care – PPO | Admitting: Sports Medicine

## 2012-08-01 ENCOUNTER — Ambulatory Visit (INDEPENDENT_AMBULATORY_CARE_PROVIDER_SITE_OTHER): Payer: BC Managed Care – PPO | Admitting: Sports Medicine

## 2012-08-01 VITALS — BP 100/60 | Ht 66.0 in | Wt 152.0 lb

## 2012-08-01 DIAGNOSIS — M25511 Pain in right shoulder: Secondary | ICD-10-CM

## 2012-08-01 DIAGNOSIS — M25519 Pain in unspecified shoulder: Secondary | ICD-10-CM

## 2012-08-01 DIAGNOSIS — M25579 Pain in unspecified ankle and joints of unspecified foot: Secondary | ICD-10-CM

## 2012-08-02 DIAGNOSIS — M25511 Pain in right shoulder: Secondary | ICD-10-CM | POA: Insufficient documentation

## 2012-08-02 NOTE — Assessment & Plan Note (Signed)
Considered having patient follow up for ultrasound of right shoulder. Patient opted for diagnostic/therapeutic subacromial injection to allow for temporary relief as she gives a large # of injections this afternoon. Will follow up in 4 weeks. Patient likely with rotator cuff tendinopathy-will consider ultrasound if not improving at follow up.

## 2012-08-02 NOTE — Assessment & Plan Note (Signed)
History of ankle sprain with tendinitis and possible small rupture of peroneal tendons. Patient continues to improve but does have pain to palpation still over lateral malleolus. She is to continue her nitroglycerin protocol for 4 more weeks and follow up with [place] at that time. Patient is to continue strengthening/proprioception exercises. No need for repeat ultrasound as long as improving.

## 2012-08-02 NOTE — Progress Notes (Signed)
  Subjective:    Patient ID: Felicia Cain, female    DOB: 1955/11/24, 56 y.o.   MRN: [id number]  HPI Ms. Thelander with a history of 2 episodes complex regional pain syndrome, peroneal tendinitis is here today for f/u of her left ankle pain.   Patient reports 80% improvement with the following therapies: nitroglycerin protocol x 4 weeks, removing ankle sleeve, and continuing 900 mg gabapentin total daily, acupuncture, and massage therapy. She has been consistent with home exercise program up until a week ago (stopped when she started to feel better). Patient states she is walking without a limp, able to walk on treadmill. Patient states she is still tender over lateral malleolus when pressing the area but continues without swelling. States pain similar to old episodes of regional pain syndrome.   Patient also complains of right shoulder pain. 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 She has pain with lifting arm overhead and has to protect the arm when bringing it down.   Past medical history: Complex regional pain syndrome as noted above Review of Systems-per HPI    Objective:   Physical Exam Gen: alert, cooperative Left ankle:  No swelling or erythema + anterior drawer and 2+ talar tilt Moderately tender to light touch over antero-lateral ankle Normal sensation and capillary refill and pulses distally  Shoulder: Inspection reveals no abnormalities, atrophy or asymmetry. Palpation is normal with no tenderness over AC joint or bicipital groove. ROM is full in all planes. Patient with negative drop arm and able to slowly adduct arm. Painful arc noted.  Rotator cuff strength diminished secondary to pain.  Empty can with positive pain/weakness. Negative Neer and Hawkin's tests.  Speeds and Yergason's tests normal. Normal scapular function observed.    Assessment & Plan:  See problem oriented charting  Right subacromial space injection Consent obtained and  verified. Cleansed site with alcohol. Topical analgesic spray: Ethyl chloride. Joint: right subacromial space Approached in typical fashion  Completed without difficulty Meds: 1cc depomedrol, 3cc 0.5% marcaine Needle: 25 gauge This was well tolerated. Aftercare instructions and Red flags advised.

## 2012-09-01 ENCOUNTER — Ambulatory Visit (INDEPENDENT_AMBULATORY_CARE_PROVIDER_SITE_OTHER): Payer: BC Managed Care – PPO | Admitting: Sports Medicine

## 2012-09-01 VITALS — BP 112/72 | Ht 66.0 in | Wt 148.0 lb

## 2012-09-01 DIAGNOSIS — M719 Bursopathy, unspecified: Secondary | ICD-10-CM

## 2012-09-01 DIAGNOSIS — M67919 Unspecified disorder of synovium and tendon, unspecified shoulder: Secondary | ICD-10-CM

## 2012-09-01 DIAGNOSIS — M25579 Pain in unspecified ankle and joints of unspecified foot: Secondary | ICD-10-CM

## 2012-09-01 DIAGNOSIS — M25572 Pain in left ankle and joints of left foot: Secondary | ICD-10-CM

## 2012-09-01 NOTE — Progress Notes (Signed)
  Subjective:    Patient ID: Felicia Cain, female    DOB: 08/06/1956, 56 y.o.   MRN: [id number]  HPI Patient comes in today for ultrasound evaluation of her right shoulder. She's had long-standing lateral shoulder pain worse with overhead activity as well as with reaching away from her body. She gets occasional catching and popping in the shoulder. Recent subacromial cortisone injection provided her with one day of symptom relief. Pain will radiate down the arm to the elbow. She is also continuing with lateral left ankle pain. This is despite treatment with topical nitroglycerin for peroneal tendinopathy. Some days are worse than others. She has recently returned from [place] where she saw a neurologist. Sounds like she is undergoing a workup for possible multiple sclerosis.    Review of Systems     Objective:   Physical Exam Well-developed, well-nourished. No acute distress.  Right shoulder: Full range of motion with a positive painful ARC. She is tender to palpation over the a.c. Joint. Positive empty can. Supraspinatus strength is 4/5 and reproducible of pain. 5/5 strength with resisted external and internal rotation. Neurovascular intact distally.  Left ankle: Full range of motion but still with some mild soft tissue swelling over the ATF. There is tenderness to palpation primarily along the anterior lateral joint line. No erythema. She walks without a significant limp.  MS[facility] ultrasound of the right shoulder: Images in both transverse and longitudinal plane were obtained. There appears to be a partial thickness distal supraspinatus tear with calcification at the insertion onto the greater tuberosity. This is seen in both long and short view. Subscapularis and infraspinatus appear to be intact. A.c. joint shows some mild spurring.       Assessment & Plan:  1. 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 Persistent left  ankle pain-possibly secondary to occult OCD   Patient is not interested in anything surgical at this time. I recommended that she discontinue the topical nitroglycerin for her ankle since she is really not getting any more benefit here and instead start applying a quarter patch of nitroglycerin daily to her right shoulder. She will start a home exercise program. Micah Flesher over the exercises with her today. Followup in 4 weeks for recheck. In regards to her chronic left ankle, I've explained to her that my next that and workup would be an MRI to rule out an occult OCD. She would like to wait on that for now.

## 2012-09-06 ENCOUNTER — Other Ambulatory Visit (HOSPITAL_COMMUNITY): Payer: Self-pay | Admitting: *Deleted

## 2012-09-06 DIAGNOSIS — G360 Neuromyelitis optica [Devic]: Secondary | ICD-10-CM

## 2012-09-14 ENCOUNTER — Ambulatory Visit (HOSPITAL_COMMUNITY)
Admission: RE | Admit: 2012-09-14 | Discharge: 2012-09-14 | Payer: BC Managed Care – PPO | Source: Ambulatory Visit | Attending: *Deleted | Admitting: *Deleted

## 2012-09-14 ENCOUNTER — Ambulatory Visit (HOSPITAL_COMMUNITY): Payer: BC Managed Care – PPO

## 2012-09-14 ENCOUNTER — Other Ambulatory Visit (HOSPITAL_COMMUNITY): Payer: Self-pay | Admitting: *Deleted

## 2012-09-14 ENCOUNTER — Inpatient Hospital Stay (HOSPITAL_COMMUNITY): Admission: RE | Admit: 2012-09-14 | Payer: BC Managed Care – PPO | Source: Ambulatory Visit

## 2012-09-14 ENCOUNTER — Ambulatory Visit (HOSPITAL_COMMUNITY)
Admission: RE | Admit: 2012-09-14 | Discharge: 2012-09-14 | Disposition: A | Discharge status: Home / Self Care | Payer: BC Managed Care – PPO | Source: Ambulatory Visit | Attending: Ophthalmology | Admitting: Ophthalmology

## 2012-09-14 DIAGNOSIS — G360 Neuromyelitis optica [Devic]: Secondary | ICD-10-CM

## 2012-09-14 DIAGNOSIS — H4710 Unspecified papilledema: Secondary | ICD-10-CM | POA: Insufficient documentation

## 2012-09-14 DIAGNOSIS — G35 Multiple sclerosis: Secondary | ICD-10-CM

## 2012-09-14 DIAGNOSIS — M549 Dorsalgia, unspecified: Secondary | ICD-10-CM | POA: Insufficient documentation

## 2012-09-14 DIAGNOSIS — H532 Diplopia: Secondary | ICD-10-CM | POA: Insufficient documentation

## 2012-09-14 DIAGNOSIS — R279 Unspecified lack of coordination: Secondary | ICD-10-CM | POA: Insufficient documentation

## 2012-09-14 DIAGNOSIS — R5381 Other malaise: Secondary | ICD-10-CM | POA: Insufficient documentation

## 2012-09-14 DIAGNOSIS — M47812 Spondylosis without myelopathy or radiculopathy, cervical region: Secondary | ICD-10-CM | POA: Insufficient documentation

## 2012-09-14 MED ORDER — GADOBENATE DIMEGLUMINE 529 MG/ML IV SOLN
15.0000 mL | Freq: Once | INTRAVENOUS | Status: AC
  Administered 2012-09-14: 15 mL via INTRAVENOUS

## 2012-09-29 ENCOUNTER — Ambulatory Visit: Payer: BC Managed Care – PPO | Admitting: Sports Medicine

## 2012-10-07 ENCOUNTER — Other Ambulatory Visit: Payer: Self-pay | Admitting: Internal Medicine

## 2012-10-07 ENCOUNTER — Encounter (HOSPITAL_BASED_OUTPATIENT_CLINIC_OR_DEPARTMENT_OTHER): Payer: Self-pay

## 2012-10-07 ENCOUNTER — Ambulatory Visit (HOSPITAL_BASED_OUTPATIENT_CLINIC_OR_DEPARTMENT_OTHER)
Admission: RE | Admit: 2012-10-07 | Discharge: 2012-10-07 | Disposition: A | Discharge status: Home / Self Care | Payer: BC Managed Care – PPO | Source: Ambulatory Visit | Attending: Neurology | Admitting: Neurology

## 2012-10-07 ENCOUNTER — Other Ambulatory Visit (HOSPITAL_BASED_OUTPATIENT_CLINIC_OR_DEPARTMENT_OTHER): Payer: Self-pay | Admitting: *Deleted

## 2012-10-07 DIAGNOSIS — G7000 Myasthenia gravis without (acute) exacerbation: Secondary | ICD-10-CM | POA: Insufficient documentation

## 2012-10-07 DIAGNOSIS — D4989 Neoplasm of unspecified behavior of other specified sites: Secondary | ICD-10-CM

## 2012-10-07 DIAGNOSIS — D569 Thalassemia, unspecified: Secondary | ICD-10-CM

## 2012-10-07 MED ORDER — IOHEXOL 350 MG/ML SOLN
100.0000 mL | Freq: Once | INTRAVENOUS | Status: AC | PRN
  Administered 2012-10-07: 100 mL via INTRAVENOUS

## 2012-10-24 ENCOUNTER — Other Ambulatory Visit (HOSPITAL_COMMUNITY): Payer: Self-pay | Admitting: Neurosurgery

## 2012-10-24 DIAGNOSIS — M48061 Spinal stenosis, lumbar region without neurogenic claudication: Secondary | ICD-10-CM

## 2012-10-28 ENCOUNTER — Other Ambulatory Visit (HOSPITAL_COMMUNITY): Payer: BC Managed Care – PPO

## 2012-10-28 ENCOUNTER — Ambulatory Visit (HOSPITAL_COMMUNITY): Payer: BC Managed Care – PPO

## 2012-10-28 ENCOUNTER — Ambulatory Visit (HOSPITAL_COMMUNITY)
Admission: RE | Admit: 2012-10-28 | Discharge: 2012-10-28 | Disposition: A | Discharge status: Home / Self Care | Payer: BC Managed Care – PPO | Source: Ambulatory Visit | Attending: Neurosurgery | Admitting: Neurosurgery

## 2012-10-28 DIAGNOSIS — M48061 Spinal stenosis, lumbar region without neurogenic claudication: Secondary | ICD-10-CM

## 2012-10-28 DIAGNOSIS — M545 Low back pain, unspecified: Secondary | ICD-10-CM | POA: Insufficient documentation

## 2012-10-28 DIAGNOSIS — M5126 Other intervertebral disc displacement, lumbar region: Secondary | ICD-10-CM | POA: Insufficient documentation

## 2012-10-28 DIAGNOSIS — M2569 Stiffness of other specified joint, not elsewhere classified: Secondary | ICD-10-CM | POA: Insufficient documentation

## 2012-10-28 MED ORDER — GADOBENATE DIMEGLUMINE 529 MG/ML IV SOLN
13.0000 mL | Freq: Once | INTRAVENOUS | Status: AC
  Administered 2012-10-28: 13 mL via INTRAVENOUS

## 2012-10-29 ENCOUNTER — Encounter: Payer: Self-pay | Admitting: Neurology

## 2012-11-27 ENCOUNTER — Other Ambulatory Visit: Payer: Self-pay | Admitting: Neurology

## 2012-12-08 ENCOUNTER — Encounter: Payer: Self-pay | Admitting: Neurology

## 2012-12-08 ENCOUNTER — Ambulatory Visit (INDEPENDENT_AMBULATORY_CARE_PROVIDER_SITE_OTHER): Payer: BC Managed Care – PPO | Admitting: Neurology

## 2012-12-08 VITALS — BP 110/70 | Temp 97.5°F | Ht 66.0 in | Wt 152.0 lb

## 2012-12-08 DIAGNOSIS — G7001 Myasthenia gravis with (acute) exacerbation: Secondary | ICD-10-CM

## 2012-12-08 MED ORDER — PYRIDOSTIGMINE BROMIDE 60 MG PO TABS
60.0000 mg | ORAL_TABLET | Freq: Four times a day (QID) | ORAL | Status: DC
Start: 2012-12-08 — End: 2013-11-28

## 2012-12-08 NOTE — Progress Notes (Signed)
[name]  Provider:   Porfirio Mylar Vasilia Cain,M.D . Referring Provider: Maeola Harman, MD Primary Care Physician:  Felicia Heckle, MD  Chief Complaint  Patient presents with  . Ptosis  . Numbness    left hand/arm    HPI: Dr.   [name] is a 57 y.o. female , right handed -with  recurrent symptoms of ptosis , facial weakness and visual changes. mostly right-sided and a history of non-vascular optic neuritis-/posterior optic neuritis. That responded to a 5 day course of pulse steroids at [facility]. The  patient later underwent an LP to evaluate her for possible multiple sclerosis but unfortunately the LP no able to obtain  in the specimen for oligoclonal bands.  She had normal brain MRI as for age and gender also recently tested in the last year with iron levels, vitamin B12, the patient is here today reporting that Mestinon helps her symptoms and the house him after a single fiber EMG was obtained started to treat her for a seronegative myasthenia gravis. This is a presumed diagnosis.  She reports that about 90 minutes after taking the medication in the morning she still develops a pulses which than over the following hour results and she needs a second dose of Mestinon at about 2 PM.  She also reports right hand tremor this not necessarily at rest there is no associated cogwheeling or rigidity She will also reports bilateral a feeling of facial weakness and facial  heaviness and jaw claudication when chewing, a   fatigability sign  In her  chewing muscle. Dr. Kirtland Cain. reported that through generally and February she had prolonged periods of back spasms and back pain and she wonders if these could be related to a myasthenic or myasthenia-like syndrome as well. A back MRI  Under Dr Felicia Cain was negative.  Review of Systems: Out of a complete 14 system review, the patient complains of only the following symptoms, and all other reviewed systems are negative.  Mestinon  has caused loose stools not watery but more frequent it has also helped dry eye and dry mouth. Ptosis and  Facial weakness - episodic.   Past Medical History  Diagnosis Date  . Cancer   . Spinal stenosis   . Optic neuropathy   . Neuropathy   . Neuromuscular disorder   . Esophageal stricture     stricture with dysphasia  . Optic neuritis     ischaemic optic neuritis, non arteric  . Brachial neuritis     neuropathy  . Posterior optic neuritis   . Ptosis of eyelid     Past Surgical History  Procedure Laterality Date  . Breast surgery    . Masectomy  bilat  . Cesarean section    . Laminectomy      L-4-L5:2000  . Breast enhancement surgery      2000, after breast cancer surgery,redone 2010  . Back surgery      lumbar fusion, Dr [id number]      Allergies as of 12/08/2012 - Review Complete 12/08/2012  Allergen Reaction Noted  . Latex Itching and Swelling 06/23/2012  . Sulfa antibiotics Swelling 10/29/2012  . Tape  06/23/2012    Vitals: BP 110/70  Temp(Src) 97.5 F (36.4 C)  Ht 5\' 6"  (1.676 m)  Wt 152 lb (68.947 kg)  BMI 24.55 kg/m2 Last Weight:  Wt Readings from Last 1 Encounters:  12/08/12 152 lb (68.947 kg)   Last Height:   Ht Readings from Last 1 Encounters:  12/08/12  5\' 6"  (1.676 m)   Vision Screening:  Left eye with correction  2-25 Right eye with correction 20-25  Physical Exam: General:   Patient is awake, alert & oriented to person, place & time.  Head:  Normocephalic. Ears, Nose, Throat:  [place] 2 Neck: circumference 15 " Respiratory:  Lungs clear throughout to auscultation.    Cardiovascular:  No carotid artery bruits.  Heart is regular rate and rhythm with no murmurs.  Skin:  No bruising, no rash.  Neurologic Exam: Mental Status: Alert, oriented, thought content appropriate.  Speech fluent without evidence of aphasia. Able to follow 3 step commands without difficulty. Cranial Nerves: II-Discs show peeling and atrophic residual after  edema. Pupils are equal reactive to light torso this is more visible on the right than on the left visual field restricted in the lower outer quadrant of the right eye field on the conjugate eye movements symmetric facial sensation is described as a feeling of weakness or heaviness predominantly in the right face and a slightly lower nasal labial fold and from angle of the mouth on the right side no dysarthria no dysphagia but the muscles of mastication this are described as fatigable.  IX/X-normal gag XI-bilateral shoulder shrug XII-midline tongue extension Motor: Muscle tone showed no rigidity and no cogwheeling but a right hand tremor at rest and with action. Sensory: Pinprick and light touch intact throughout, bilaterally Deep Tendon Reflexes: 2+ and symmetric throughout. Plantars: Downgoing bilaterally Tremor- right hand,  Normal finger-to-nose, normal rapid alternating movements and normal heel-to-shin test.   Normal gait and station.

## 2012-12-08 NOTE — Patient Instructions (Signed)
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 This weakness has also caused on fine motor impairment in the right dominant hand and a resting and action tremor has been noticed at times the right grip strength is weaker than the left .  2)independent of the diagnosis of myasthenia gravis there is evidence of a non-arteritic  optic neuritis which seems to have become dormant.  Plan is to increase her Mestinon from 3 times a day to 4 times a day by mouth.

## 2012-12-09 ENCOUNTER — Ambulatory Visit: Payer: Self-pay | Admitting: Neurology

## 2012-12-12 ENCOUNTER — Telehealth: Payer: Self-pay | Admitting: *Deleted

## 2012-12-12 NOTE — Telephone Encounter (Signed)
Faxed a copy of pt's updated medications to Dr Ouida Sills office

## 2012-12-16 ENCOUNTER — Ambulatory Visit: Payer: Self-pay | Admitting: Neurology

## 2013-09-25 ENCOUNTER — Telehealth: Payer: Self-pay | Admitting: *Deleted

## 2013-09-25 ENCOUNTER — Ambulatory Visit (INDEPENDENT_AMBULATORY_CARE_PROVIDER_SITE_OTHER): Payer: Managed Care, Other (non HMO) | Admitting: Internal Medicine

## 2013-09-25 ENCOUNTER — Encounter: Payer: Self-pay | Admitting: Internal Medicine

## 2013-09-25 VITALS — BP 112/53 | HR 72 | Temp 97.7°F | Resp 18 | Wt 154.0 lb

## 2013-09-25 DIAGNOSIS — C50919 Malignant neoplasm of unspecified site of unspecified female breast: Secondary | ICD-10-CM

## 2013-09-25 DIAGNOSIS — G7000 Myasthenia gravis without (acute) exacerbation: Secondary | ICD-10-CM

## 2013-09-25 LAB — LIPID PANEL
CHOL/HDL RATIO: 2.8 ratio
CHOLESTEROL: 196 mg/dL (ref 0–200)
HDL: 71 mg/dL (ref >39)
LDL Cholesterol: 109 mg/dL — ABNORMAL HIGH (ref 0–99)
Triglycerides: 78 mg/dL (ref <150)
VLDL: 16 mg/dL (ref 0–40)

## 2013-09-25 LAB — POCT URINALYSIS DIPSTICK
BILIRUBIN UA: NEGATIVE
Blood, UA: NEGATIVE
Glucose, UA: NEGATIVE
KETONES UA: NEGATIVE
LEUKOCYTES UA: NEGATIVE
Nitrite, UA: NEGATIVE
PH UA: 6
Protein, UA: NEGATIVE
Spec Grav, UA: 1.025
Urobilinogen, UA: NEGATIVE

## 2013-09-25 LAB — CBC WITH DIFFERENTIAL/PLATELET
BASOS ABS: 0 10*3/uL (ref 0.0–0.1)
BASOS PCT: 0 % (ref 0–1)
EOS ABS: 0.1 10*3/uL (ref 0.0–0.7)
EOS PCT: 3 % (ref 0–5)
HCT: 40.1 % (ref 36.0–46.0)
Hemoglobin: 13.6 g/dL (ref 12.0–15.0)
Lymphocytes Relative: 32 % (ref 12–46)
Lymphs Abs: 1.6 10*3/uL (ref 0.7–4.0)
MCH: 30.7 pg (ref 26.0–34.0)
MCHC: 33.9 g/dL (ref 30.0–36.0)
MCV: 90.5 fL (ref 78.0–100.0)
Monocytes Absolute: 0.7 10*3/uL (ref 0.1–1.0)
Monocytes Relative: 13 % — ABNORMAL HIGH (ref 3–12)
Neutro Abs: 2.7 10*3/uL (ref 1.7–7.7)
Neutrophils Relative %: 52 % (ref 43–77)
PLATELETS: 290 10*3/uL (ref 150–400)
RBC: 4.43 MIL/uL (ref 3.87–5.11)
RDW: 13.2 % (ref 11.5–15.5)
WBC: 5.2 10*3/uL (ref 4.0–10.5)

## 2013-09-25 LAB — COMPREHENSIVE METABOLIC PANEL
ALK PHOS: 45 U/L (ref 39–117)
ALT: 12 U/L (ref 0–35)
AST: 18 U/L (ref 0–37)
Albumin: 3.9 g/dL (ref 3.5–5.2)
BILIRUBIN TOTAL: 0.4 mg/dL (ref 0.3–1.2)
BUN: 16 mg/dL (ref 6–23)
CO2: 30 mEq/L (ref 19–32)
CREATININE: 0.58 mg/dL (ref 0.50–1.10)
Calcium: 9.4 mg/dL (ref 8.4–10.5)
Chloride: 104 mEq/L (ref 96–112)
Glucose, Bld: 84 mg/dL (ref 70–99)
Potassium: 4.5 mEq/L (ref 3.5–5.3)
Sodium: 141 mEq/L (ref 135–145)
Total Protein: 6.2 g/dL (ref 6.0–8.3)

## 2013-09-25 LAB — T4, FREE: Free T4: 1.03 ng/dL (ref 0.80–1.80)

## 2013-09-25 LAB — SEDIMENTATION RATE: Sed Rate: 5 mm/hr (ref 0–22)

## 2013-09-25 LAB — RHEUMATOID FACTOR

## 2013-09-25 LAB — TSH: TSH: 0.533 u[IU]/mL (ref 0.350–4.500)

## 2013-09-25 LAB — T3, FREE: T3, Free: 3.2 pg/mL (ref 2.3–4.2)

## 2013-09-25 MED ORDER — TRAMADOL-ACETAMINOPHEN 37.5-325 MG PO TABS: 1.0000 | ORAL_TABLET | Freq: Four times a day (QID) | ORAL | Status: DC | PRN

## 2013-09-25 NOTE — Telephone Encounter (Signed)
Felicia Cain  Ok to give them verbal order for PT to neck as well

## 2013-09-25 NOTE — Telephone Encounter (Signed)
[facility] called for a order for neck pain as well

## 2013-09-25 NOTE — Progress Notes (Signed)
Subjective:    Patient ID: Felicia Cain, female    DOB: [birth year], 58 y.o.   MRN: [id number]  HPI  Felicia Cain is here for acute visit.  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    She reports multiple chronic symptoms that have been worsening over the last 3-4 months.  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 Fredna Dow and is having PT now.  (worker's compensation)  She reports worsening "pain all over"  L side of neck, Left L/S spine area (previous R sided laminectomy / discectomy  Dr. Vertell Limber),  Multiple joint pains.  She denies active redness or warmth in joints.  Also of concern to her is that she feels "off balance"   Needs to hold onto walls at times when ambulating.  She has not fallen.   Felicia Cain has been evaluated by multiple specialists,  Neurologist ( Dr. Brett Fairy), Rheumatologist (Dr. Gavin Pound  I do not have records) ,  Hand specialist (Dr. Fredna Dow)   She has tried acupumcture and alternative treatments for her discomfort..  She is hesitant to take NSAIDS regularly due to GI SE  See imaging  MRI brain, cervical spine, and thoracic spine  done 09/14/2012.    Brain shows optic neuritis  Single focus with flair signal in Left frontal white matter felt to be unlikely to represent clinical significance. ,  C-spine no primary cord pathology mid cervical spondylosis normal thoracic spine.  Lumbar MRI done 10/28/2012 shows no change since 07/26/2009  Dr. Vertell Limber felt no surgical intervention required.    Allergies  Allergen Reactions  . Latex Itching and Swelling  . Sulfa Antibiotics Swelling    Redness, topical  . Tape      Blistering on site, can only use paper tape or surgical.   Past Medical History  Diagnosis Date  . Cancer   . Spinal stenosis   . Optic neuropathy   . Neuropathy    . Neuromuscular disorder   . Esophageal stricture     stricture with dysphasia  . Optic neuritis     ischaemic optic neuritis, non arteric  . Brachial neuritis     neuropathy  . Posterior optic neuritis   . Ptosis of eyelid    Past Surgical History  Procedure Laterality Date  . Breast surgery    . Masectomy  bilat  . Cesarean section    . Laminectomy      L-4-L5:2000  . Breast enhancement surgery      2000, after breast cancer surgery,redone 2010  . Back surgery      lumbar fusion, Dr [id number]   History   Social History  . Marital Status: Widowed    Spouse Name: N/A    Number of Children: N/A  . Years of Education: N/A   Occupational History  . Not on file.   Social History Main Topics  . Smoking status: Never Smoker   . Smokeless tobacco: Not on file  . Alcohol Use: No  . Drug Use: No  . Sexual Activity: Not on file   Other Topics Concern  . Not on file   Social History Narrative  . No narrative on file   Family History  Problem Relation Age of Onset  . Stroke Mother   . Hypertension  Mother   . Early death Father   . Cancer Father 39    sarcoma  . Cancer Maternal Uncle   . Cancer Paternal Grandmother     breast  . Parkinson's disease Maternal Aunt    Patient Active Problem List   Diagnosis Date Noted  . Myasthenia gravis with exacerbation, ocular 12/08/2012  . Right shoulder pain 08/02/2012  . 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  . History of optic neuritis 06/27/2012  . Rosacea 06/27/2012  . S/P laminectomy 06/27/2012  . 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 S/P bilateral mastectomy 06/26/2012  . BRCA negative 06/26/2012  . GERD (gastroesophageal reflux disease) 06/26/2012  . Menopause 06/26/2012  . Ankle pain 06/02/2012   Current Outpatient Prescriptions on File Prior to Visit  Medication Sig Dispense Refill  . aspirin 81 MG tablet Take 81 mg by mouth daily.        . Black Cohosh 40 MG CAPS Take by mouth. One tablet daily        . doxycycline (VIBRAMYCIN) 100 MG capsule Take 100 mg by mouth daily.      Marland Kitchen gabapentin (NEURONTIN) 300 MG capsule Take 300 mg by mouth 2 (two) times daily.        Marland Kitchen pyridostigmine (MESTINON) 60 MG tablet Take 1 tablet (60 mg total) by mouth 4 (four) times daily.  160 tablet  5  . sertraline (ZOLOFT) 25 MG tablet Take 25 mg by mouth daily.        . Omega-3 Fatty Acids (FISH OIL) 1200 MG CAPS Take 1,200 mg by mouth 2 (two) times daily.       No current facility-administered medications on file prior to visit.      Review of Systems See HPI    Objective:   Physical Exam Physical Exam  Nursing note and vitals reviewed.  Constitutional: She is oriented to person, place, and time. She appears well-developed and well-nourished.  HENT:  Head: Normocephalic and atraumatic.  Cardiovascular: Normal rate and regular rhythm. Exam reveals no gallop and no friction rub.  No murmur heard.  Pulmonary/Chest: Breath sounds normal. She has no wheezes. She has no rales.  Neurological: She is alert and oriented to person, place, and time. Romberg:  Slightly positive CN  II-xii intact slight abnormality with lateral gaze Motor  5/5 all  extremties but weakness 4/5 right hand intrinsics and R arm Reflexes 2+ symmetric Cerebellar intact FTN Sensory intact to microfilament Gait slight ataxic gait  Skin: Skin is warm and dry.  Psychiatric: She has a normal mood and affect. Her behavior is normal.        Assessment & Plan:  Ataxia,  Chronic pain multiple sites,  Radial neuropathy,:  Pt wishes referral to tertiary care center and wishes to see [facility]uologist at [facility].  Will also discuss with Dr. Brett Fairy.  ?? Possible MS,  ?? Worsening myasthenia .    OK to have PT to lumbar and cervical area -  She currently has PT for her R sided radial neuropathy  Presumed seronegative Myasthenia  On mestinon now  Optic neuritis.     Will get all labs today and request rheumatology notes and discuss with  neurologist.  [facility] for referral to tertiary care center.

## 2013-09-26 LAB — ANA: Anti Nuclear Antibody(ANA): NEGATIVE

## 2013-09-27 ENCOUNTER — Encounter: Payer: Self-pay | Admitting: *Deleted

## 2013-09-27 ENCOUNTER — Telehealth: Payer: Self-pay | Admitting: *Deleted

## 2013-09-27 ENCOUNTER — Telehealth: Payer: Self-pay | Admitting: Internal Medicine

## 2013-09-27 DIAGNOSIS — R270 Ataxia, unspecified: Secondary | ICD-10-CM

## 2013-09-27 NOTE — Telephone Encounter (Signed)
Message copied by Conley Rolls on Wed Sep 27, 2013  2:10 PM ------      Message from: Emi Belfast D      Created: Wed Sep 27, 2013 11:51 AM       Ok to mail to pt if not done already ------

## 2013-09-27 NOTE — Telephone Encounter (Signed)
Spoke with pt and with Dr. Brett Fairy.  Agree with referral to tertiary care center.  Will set up with Dr. Queen Slough at [facility]   All labs normal

## 2013-10-03 ENCOUNTER — Ambulatory Visit: Payer: Managed Care, Other (non HMO) | Attending: Internal Medicine | Admitting: Physical Therapy

## 2013-10-03 DIAGNOSIS — G7000 Myasthenia gravis without (acute) exacerbation: Secondary | ICD-10-CM | POA: Insufficient documentation

## 2013-10-03 DIAGNOSIS — M545 Low back pain, unspecified: Secondary | ICD-10-CM | POA: Insufficient documentation

## 2013-10-03 DIAGNOSIS — M542 Cervicalgia: Secondary | ICD-10-CM | POA: Insufficient documentation

## 2013-10-03 DIAGNOSIS — Z853 Personal history of malignant neoplasm of breast: Secondary | ICD-10-CM | POA: Insufficient documentation

## 2013-10-03 DIAGNOSIS — IMO0001 Reserved for inherently not codable concepts without codable children: Secondary | ICD-10-CM | POA: Insufficient documentation

## 2013-10-03 DIAGNOSIS — R5381 Other malaise: Secondary | ICD-10-CM | POA: Insufficient documentation

## 2013-10-03 DIAGNOSIS — G90529 Complex regional pain syndrome I of unspecified lower limb: Secondary | ICD-10-CM | POA: Insufficient documentation

## 2013-10-04 ENCOUNTER — Other Ambulatory Visit: Payer: Self-pay | Admitting: *Deleted

## 2013-10-04 ENCOUNTER — Telehealth: Payer: Self-pay | Admitting: *Deleted

## 2013-10-04 DIAGNOSIS — M6281 Muscle weakness (generalized): Secondary | ICD-10-CM

## 2013-10-04 NOTE — Telephone Encounter (Signed)
Pt has several dates  that she is able schedule her referral Neuro

## 2013-10-04 NOTE — Telephone Encounter (Signed)
Call [place] about her schedule for referral and other issues that Dr [name] asked her to call you about.

## 2013-10-06 ENCOUNTER — Ambulatory Visit: Payer: Managed Care, Other (non HMO) | Admitting: Physical Therapy

## 2013-10-17 ENCOUNTER — Ambulatory Visit: Payer: Managed Care, Other (non HMO) | Attending: Internal Medicine | Admitting: Physical Therapy

## 2013-10-17 DIAGNOSIS — M545 Low back pain, unspecified: Secondary | ICD-10-CM | POA: Insufficient documentation

## 2013-10-17 DIAGNOSIS — G7000 Myasthenia gravis without (acute) exacerbation: Secondary | ICD-10-CM | POA: Insufficient documentation

## 2013-10-17 DIAGNOSIS — IMO0001 Reserved for inherently not codable concepts without codable children: Secondary | ICD-10-CM | POA: Insufficient documentation

## 2013-10-17 DIAGNOSIS — R5381 Other malaise: Secondary | ICD-10-CM | POA: Insufficient documentation

## 2013-10-17 DIAGNOSIS — G90529 Complex regional pain syndrome I of unspecified lower limb: Secondary | ICD-10-CM | POA: Insufficient documentation

## 2013-10-17 DIAGNOSIS — M542 Cervicalgia: Secondary | ICD-10-CM | POA: Insufficient documentation

## 2013-10-17 DIAGNOSIS — Z853 Personal history of malignant neoplasm of breast: Secondary | ICD-10-CM | POA: Insufficient documentation

## 2013-10-19 ENCOUNTER — Ambulatory Visit: Payer: Managed Care, Other (non HMO) | Admitting: Physical Therapy

## 2013-10-31 ENCOUNTER — Encounter: Payer: Managed Care, Other (non HMO) | Admitting: Physical Therapy

## 2013-10-31 ENCOUNTER — Ambulatory Visit: Payer: Managed Care, Other (non HMO) | Admitting: Sports Medicine

## 2013-11-01 ENCOUNTER — Other Ambulatory Visit: Payer: Self-pay | Admitting: *Deleted

## 2013-11-01 DIAGNOSIS — E7889 Other lipoprotein metabolism disorders: Secondary | ICD-10-CM

## 2013-11-01 LAB — LIPID PANEL
CHOL/HDL RATIO: 2.5 ratio
CHOLESTEROL: 188 mg/dL (ref 0–200)
HDL: 74 mg/dL (ref >39)
LDL Cholesterol: 105 mg/dL — ABNORMAL HIGH (ref 0–99)
Triglycerides: 46 mg/dL (ref <150)
VLDL: 9 mg/dL (ref 0–40)

## 2013-11-02 ENCOUNTER — Encounter: Payer: Managed Care, Other (non HMO) | Admitting: Physical Therapy

## 2013-11-03 ENCOUNTER — Ambulatory Visit: Payer: Managed Care, Other (non HMO) | Admitting: Physical Therapy

## 2013-11-06 ENCOUNTER — Encounter: Payer: Self-pay | Admitting: *Deleted

## 2013-11-14 ENCOUNTER — Encounter: Payer: Managed Care, Other (non HMO) | Admitting: Physical Therapy

## 2013-11-16 ENCOUNTER — Telehealth: Payer: Self-pay | Admitting: *Deleted

## 2013-11-16 ENCOUNTER — Encounter: Payer: Managed Care, Other (non HMO) | Admitting: Physical Therapy

## 2013-11-28 ENCOUNTER — Other Ambulatory Visit: Payer: Self-pay | Admitting: Neurology

## 2013-11-28 NOTE — Telephone Encounter (Signed)
error 

## 2013-11-29 ENCOUNTER — Telehealth: Payer: Self-pay | Admitting: *Deleted

## 2013-11-29 NOTE — Telephone Encounter (Signed)
Wants to talk to you about referral to [facility].  Please call [phone].

## 2013-12-18 ENCOUNTER — Telehealth: Payer: Self-pay | Admitting: *Deleted

## 2013-12-18 DIAGNOSIS — H02402 Unspecified ptosis of left eyelid: Secondary | ICD-10-CM

## 2013-12-18 NOTE — Telephone Encounter (Signed)
Felicia Cain calling about 2nd referral to [facility] and wants to know their response. Please give her a call

## 2013-12-29 ENCOUNTER — Other Ambulatory Visit: Payer: Self-pay | Admitting: Neurology

## 2014-01-01 MED ORDER — PYRIDOSTIGMINE BROMIDE 60 MG PO TABS: 60.0000 mg | ORAL_TABLET | Freq: Four times a day (QID) | ORAL | Status: DC

## 2014-01-01 NOTE — Telephone Encounter (Signed)
Refilled mestinon for seronegative patient with ptosis,.and re routed referral to Dr Nanine Means, [place]

## 2014-01-24 ENCOUNTER — Ambulatory Visit (INDEPENDENT_AMBULATORY_CARE_PROVIDER_SITE_OTHER): Payer: Managed Care, Other (non HMO) | Admitting: Sports Medicine

## 2014-01-24 ENCOUNTER — Encounter: Payer: Self-pay | Admitting: Sports Medicine

## 2014-01-24 VITALS — BP 101/65 | Ht 66.0 in | Wt 148.0 lb

## 2014-01-24 DIAGNOSIS — M545 Low back pain, unspecified: Secondary | ICD-10-CM

## 2014-01-24 NOTE — Progress Notes (Signed)
   Subjective:    Patient ID: Felicia Cain, female    DOB: 1955-11-02, 58 y.o.   MRN: [id number]  HPI chief complaint: Left-sided low back hip and thigh pain  Patient comes in today complaining of intermittent pain along the left side of her lower back and into her hip. Pain will occur with activity. She gets intermittent spasm along the lower lumbar spine and into her left hip and thigh area. Some radiating pain down to her knee but no numbness or tingling. No recent trauma. She is also getting intermittent left ankle pain. Also some locking and catching in the ankle. Pain can be quite excruciating. She's not noticed any swelling. Today, however, it is feeling better. Interim medical history reviewed Medications reviewed Allergies reviewed    Review of Systems     Objective:   Physical Exam Well-developed, well-nourished. No acute distress. Awake alert and oriented x3. Vital signs are reviewed.  There is diffuse tenderness to palpation along the left lumbar musculature as well as into the left buttock. Spasm of the left paraspinal muscles are as well. Smooth painless hip range of motion. Negative straight leg raise. Positive FABER on the left. Neurovascularly intact distally.  Left ankle: Full range of motion. No obvious effusion. No soft tissue swelling. Walking without a limp.       Assessment & Plan:  Left-sided low back pain/hip pain-question SI joint dysfunction Reoccurring left ankle pain  I think the patient would benefit from formal physical therapy as treatment for her low back and left hip pain. I will set her up to work with Barbaraann Barthel. I discussed the possibility of further diagnostic imaging for her ongoing left ankle issues but given the fact that she is relatively symptom free today we will hold on that for now. Patient will followup with me in 4 weeks.

## 2014-02-20 ENCOUNTER — Ambulatory Visit: Payer: Managed Care, Other (non HMO) | Admitting: Sports Medicine

## 2014-04-07 DIAGNOSIS — G7001 Myasthenia gravis with (acute) exacerbation: Secondary | ICD-10-CM

## 2014-04-07 HISTORY — DX: Myasthenia gravis with (acute) exacerbation: G70.01

## 2014-04-26 ENCOUNTER — Telehealth: Payer: Self-pay | Admitting: Internal Medicine

## 2014-04-26 NOTE — Telephone Encounter (Signed)
Spoke with patient and she would like to see Dr. Olevia Perches for GERD. States her ENT diagnosed her and put her on Nexium. She continues to have reflux and burning. Per patient will need to work around her work schedule. She will be off starting 05/15/14.Scheduled patient on 05/15/14 at 8:45 AM.

## 2014-05-15 ENCOUNTER — Ambulatory Visit: Payer: Managed Care, Other (non HMO) | Admitting: Internal Medicine

## 2014-05-17 ENCOUNTER — Ambulatory Visit: Payer: Managed Care, Other (non HMO) | Admitting: Internal Medicine

## 2014-07-02 ENCOUNTER — Ambulatory Visit (INDEPENDENT_AMBULATORY_CARE_PROVIDER_SITE_OTHER): Payer: Managed Care, Other (non HMO) | Admitting: Neurology

## 2014-07-02 ENCOUNTER — Telehealth: Payer: Self-pay | Admitting: *Deleted

## 2014-07-02 ENCOUNTER — Encounter: Payer: Self-pay | Admitting: Neurology

## 2014-07-02 VITALS — BP 125/67 | HR 69 | Temp 98.0°F | Resp 14 | Ht 66.0 in | Wt 157.8 lb

## 2014-07-02 DIAGNOSIS — G7001 Myasthenia gravis with (acute) exacerbation: Secondary | ICD-10-CM

## 2014-07-02 DIAGNOSIS — R0902 Hypoxemia: Secondary | ICD-10-CM

## 2014-07-02 DIAGNOSIS — H02402 Unspecified ptosis of left eyelid: Secondary | ICD-10-CM

## 2014-07-02 DIAGNOSIS — R0609 Other forms of dyspnea: Secondary | ICD-10-CM

## 2014-07-02 DIAGNOSIS — R0689 Other abnormalities of breathing: Secondary | ICD-10-CM

## 2014-07-02 MED ORDER — PYRIDOSTIGMINE BROMIDE 60 MG PO TABS: 60.0000 mg | ORAL_TABLET | Freq: Four times a day (QID) | ORAL | Status: DC

## 2014-07-02 MED ORDER — MYCOPHENOLATE MOFETIL 250 MG PO CAPS: 250.0000 mg | ORAL_CAPSULE | Freq: Two times a day (BID) | ORAL | Status: DC

## 2014-07-02 NOTE — Telephone Encounter (Signed)
Pt received call from pharmacy and cellcept requires PA.  Felicia Cain is her insurance.  Pt# number is [phone].

## 2014-07-02 NOTE — Telephone Encounter (Signed)
Pt asking for letter for reimbursement of tickets due to medical condition.   To whom this may concern please.

## 2014-07-02 NOTE — Telephone Encounter (Signed)
PA form has been completed and faxed to ins.  They will notify patient of outcome once decision has been made.  I called the patient back.  Got no answer.  Left message.

## 2014-07-02 NOTE — Patient Instructions (Addendum)
Mycophenolate capsules What is this medicine? MYCOPHENOLATE MOFETIL (mye koe FEN oh late MOE fe til) is used to decrease the immune system's response to a transplanted organ. This medicine may be used for other purposes; ask your health care provider or pharmacist if you have questions. COMMON BRAND NAME(S): CellCept What should I tell my health care provider before I take this medicine? They need to know if you have any of these conditions: -anemia or other blood disorder -diarrhea -immune system problems -infection -kidney disease -phenylketonuria -stomach problems -an unusual or allergic reaction to mycophenolate mofetil, other medicines, foods, dyes, or preservatives -pregnant or trying to get pregnant -breast-feeding How should I use this medicine? Take this medicine by mouth with a full glass of water. Follow the directions on the prescription label. Take this medicine on an empty stomach, at least 1 hour before or 2 hours after food. Do not take with food unless your doctor approves. Swallow the medicine whole. Do not cut, crush, or chew the medicine. If the medicine is broken or is not intact, do not get the powder on your skin or eyes. If contact occurs, rinse thoroughly with water. Take your medicine at regular intervals. Do not take your medicine more often than directed. Do not stop taking except on your doctor's advice. A special MedGuide will be given to you by the pharmacist with each prescription and refill. Be sure to read this information carefully each time. Talk to your pediatrician regarding the use of this medicine in children. Special care may be needed. Overdosage: If you think you have taken too much of this medicine contact a poison control center or emergency room at once. NOTE: This medicine is only for you. Do not share this medicine with others. What if I miss a dose? If you miss a dose, take it as soon as you can. If it is almost time for your next dose, take  only that dose. Do not take double or extra doses. What may interact with this medicine? -acyclovir or valacyclovir -antacids -azathioprine -birth control pills -certain antibiotics like ciprofloxacin and amoxicillin; clavulanic acid -ganciclovir or valganciclovir -lanthanum carbonate -medicines for cholesterol like cholestyramine and colestipol -metronidazole -norfloxacin -other mycophenolate medicines -probenecid -rifampin -sevelamer -vaccines This list may not describe all possible interactions. Give your health care provider a list of all the medicines, herbs, non-prescription drugs, or dietary supplements you use. Also tell them if you smoke, drink alcohol, or use illegal drugs. Some items may interact with your medicine. What should I watch for while using this medicine? Visit your doctor or health care professional for regular checks on your progress. You will need frequent blood checks during the first few months you are receiving the medicine. This medicine can make you more sensitive to the sun. Keep out of the sun. If you cannot avoid being in the sun, wear protective clothing and use sunscreen. Do not use sun lamps or tanning beds/booths. This medicine can cause birth defects. Do not get pregnant while taking this drug. Females will need to have a negative pregnancy test before starting this medicine. If sexually active, use 2 reliable forms of birth control together for 4 weeks before starting this medicine, while you are taking this medicine, and for 6 weeks after you stop taking this medicine. Birth control pills alone may not work properly while you are taking this medicine. If you think that you might be pregnant talk to your doctor right away. If you get a cold or other  infection while receiving this medicine, call your doctor or health care professional. Do not treat yourself. The medicine may decrease your body's ability to fight infections. What side effects may I notice  from receiving this medicine? Side effects that you should report to your doctor or health care professional as soon as possible: -allergic reactions like skin rash, itching or hives, swelling of the face, lips, or tongue -bloody, dark, or tarry stools -changes in vision -dizziness -fever, chills or any other sign of infection -unusual bleeding or bruising -unusually weak or tired Side effects that usually do not require medical attention (report to your doctor or health care professional if they continue or are bothersome): -constipation -diarrhea -difficulty sleeping -loss of appetite -nausea, vomiting This list may not describe all possible side effects. Call your doctor for medical advice about side effects. You may report side effects to FDA at 1-800-FDA-1088. Where should I keep my medicine? Keep out of the reach of children. Store at room temperature between 15 and 30 degrees C (59 and 86 degrees F). Throw away any unused medicine after the expiration date. NOTE: This sheet is a summary. It may not cover all possible information. If you have questions about this medicine, talk to your doctor, pharmacist, or health care provider.  2015, Elsevier/Gold Standard. (2008-03-05 09:25:30).  I will meet with you again after the sleep study, preferred in December 2015.

## 2014-07-02 NOTE — Telephone Encounter (Signed)
[facility],    My patient  , Felicia M.K. Has been unable to travel due to a sudden exacerbation in her neurological condition, Myasthenia Gravis . She is unable to fly long distance at this time.   Sincerely,   Larey Seat, MD
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
## 2014-07-03 ENCOUNTER — Encounter: Payer: Self-pay | Admitting: *Deleted

## 2014-07-03 NOTE — Telephone Encounter (Signed)
Printed and to be signed.

## 2014-07-05 ENCOUNTER — Encounter: Payer: Self-pay | Admitting: *Deleted

## 2014-07-05 NOTE — Progress Notes (Signed)
This encounter was created in error - please disregard.

## 2014-07-06 ENCOUNTER — Telehealth: Payer: Self-pay | Admitting: *Deleted

## 2014-07-06 DIAGNOSIS — G7000 Myasthenia gravis without (acute) exacerbation: Secondary | ICD-10-CM

## 2014-07-06 NOTE — Telephone Encounter (Signed)
I faxed to [place] for records relating to hospital admission 04-29-14.  (MG crisis/ IVIG).

## 2014-07-06 NOTE — Telephone Encounter (Signed)
Message copied by Oliver Hum on Fri Jul 06, 2014  1:26 PM ------      Message from: Gilda Crease      Created: Fri Jul 06, 2014 12:09 PM       Update:      We are going to put this through her insurance to try and get auth for Hizentra with myasthnia gravis diagnosis.            Can you help me locate her dosage information from when she was in [place] on IVIG?  I cannot find in Epic.  This will give my pharmacist something to go on to recommend dosage for the Hizentra.  If you all do not have that is still ok.  We can calculate but like to see what she was previously on.            Thanks      Gwinda Passe            ----- Message -----         From: Liane Comber, RN         Sent: 07/05/2014   2:33 PM           To: [place],  I have this pt of Dr Dohmeier;s who she is wanting to see about giving Hizentra (sq IVIG).  She has [employer], which I know [facility] takes.  I thnk she would not be HH due to her ambulatory status.  How would I work her up for this?  Thanks,            [facility].       ------

## 2014-07-09 ENCOUNTER — Encounter: Payer: Self-pay | Admitting: Neurology

## 2014-07-09 NOTE — Telephone Encounter (Signed)
Will use IG injectable as HYZENTRA , 50 gram per week. See conversation with [facility].  I cannot find an EPIC format allowing  me to order this medication .

## 2014-07-12 ENCOUNTER — Telehealth: Payer: Self-pay

## 2014-07-12 DIAGNOSIS — H02402 Unspecified ptosis of left eyelid: Secondary | ICD-10-CM

## 2014-07-12 MED ORDER — MYCOPHENOLATE MOFETIL 250 MG PO CAPS: 250.0000 mg | ORAL_CAPSULE | Freq: Two times a day (BID) | ORAL | Status: DC

## 2014-07-12 NOTE — Telephone Encounter (Signed)
I called the patient to let them know their Rx for Cellcept was ready for pickup. Patient was instructed to bring Photo ID.

## 2014-07-12 NOTE — Telephone Encounter (Signed)
Informed Felicia Cain and she will call pt to pick up.

## 2014-07-12 NOTE — Telephone Encounter (Signed)
[employer] sent [place] a letter saying they do not review Medication Prior Auth Requests, and asked that we call [employer] at [phone].  I called them, spoke with Illiyan.  He reviewed the patient's file, and said no prior Josem Kaufmann is required.  Indicates the patient must use [facility] in order to fill Mycophenolate, as it is the only pharmacy contracted with her plan to distribute this drug.  I called the patient and explained info provided by ins.  She aid she would like to get a written Rx, and will pick it up when it's ready.  She will take it to the pharmacy at her job and see if they will fill it for her.

## 2014-07-16 ENCOUNTER — Telehealth: Payer: Self-pay | Admitting: *Deleted

## 2014-07-16 NOTE — Telephone Encounter (Signed)
Form, [employer]IG Claims Dr Dohmeier completed at patient office visit 07/12/14,patient will come in and sign a release and pick up.

## 2014-07-16 NOTE — Telephone Encounter (Signed)
DR. Gerald Dexter asked if the forms were to be picked up or to be mailed ?

## 2014-07-17 NOTE — Telephone Encounter (Signed)
Per Coolidge Breeze in MR, pt picked up reimbursement forms yesterday.

## 2014-08-07 ENCOUNTER — Ambulatory Visit (INDEPENDENT_AMBULATORY_CARE_PROVIDER_SITE_OTHER): Payer: Managed Care, Other (non HMO) | Admitting: Neurology

## 2014-08-07 DIAGNOSIS — R0689 Other abnormalities of breathing: Secondary | ICD-10-CM

## 2014-08-07 DIAGNOSIS — H02402 Unspecified ptosis of left eyelid: Secondary | ICD-10-CM

## 2014-08-07 DIAGNOSIS — R0902 Hypoxemia: Secondary | ICD-10-CM

## 2014-08-07 DIAGNOSIS — G7001 Myasthenia gravis with (acute) exacerbation: Secondary | ICD-10-CM

## 2014-08-07 DIAGNOSIS — R0609 Other forms of dyspnea: Secondary | ICD-10-CM

## 2014-08-07 NOTE — Sleep Study (Signed)
Please see the scanned sleep study interpretation located in the Procedure tab within the Chart Review section. 

## 2014-08-08 ENCOUNTER — Telehealth: Payer: Self-pay | Admitting: Neurology

## 2014-08-08 DIAGNOSIS — G4733 Obstructive sleep apnea (adult) (pediatric): Secondary | ICD-10-CM

## 2014-08-08 DIAGNOSIS — G7000 Myasthenia gravis without (acute) exacerbation: Secondary | ICD-10-CM

## 2014-08-08 NOTE — Telephone Encounter (Signed)
Ms. Diloreto called saying a Rx of Hizentra was sent to a local pharmacy for her. She works for [employer] and said if she uses a Engineer, agricultural; the medication will be paid for. She's wondering if another Rx can be printed so she can take it there. Please call the pt to advise if this is ok. Pt ph# [phone] Thank you.

## 2014-08-09 NOTE — Telephone Encounter (Signed)
Dr. Marthann Schiller order was processed with  [facility]?

## 2014-08-09 NOTE — Telephone Encounter (Signed)
I spoke to pt and let her know that I am investigating the hizentra process.   She would like a hard copy of prescription so if needed for [facility] she will have since she is here in [place] today.

## 2014-08-09 NOTE — Telephone Encounter (Signed)
I called [facility] and LMVM for Felicia Cain, with [facility] (who deals with Hizentra).  Asked her to call me back about hizentra and using [facility].

## 2014-08-14 ENCOUNTER — Encounter: Payer: Self-pay | Admitting: Neurology

## 2014-08-14 ENCOUNTER — Encounter: Payer: Self-pay | Admitting: *Deleted

## 2014-08-14 DIAGNOSIS — R0689 Other abnormalities of breathing: Secondary | ICD-10-CM | POA: Insufficient documentation

## 2014-08-14 DIAGNOSIS — G7001 Myasthenia gravis with (acute) exacerbation: Secondary | ICD-10-CM | POA: Insufficient documentation

## 2014-08-14 DIAGNOSIS — R0609 Other forms of dyspnea: Secondary | ICD-10-CM

## 2014-08-14 DIAGNOSIS — R0902 Hypoxemia: Secondary | ICD-10-CM | POA: Insufficient documentation

## 2014-08-14 NOTE — Telephone Encounter (Signed)
CPAP autotitration ordered. 5 through 12 cm water

## 2014-08-14 NOTE — Telephone Encounter (Signed)
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

## 2014-08-14 NOTE — Telephone Encounter (Addendum)
I called and tried to contact Felicia Cain, had to LM for her to return call.  This is related to her HIZENTRA sub Q IVIG.

## 2014-08-14 NOTE — Telephone Encounter (Signed)
Ordered processed today with [facility].  DP

## 2014-08-15 NOTE — Telephone Encounter (Signed)
Spoke to [employer].  She is in contact with pt about issue of medication (Hizentra).  Pt requesting hard copy of prescription so as to take to [place], call her when done [phone].  Will forward to Dr. Brett Fairy.

## 2014-08-16 NOTE — Telephone Encounter (Signed)
Patient requesting to pick up hard copy of Rx Hizentra.  Please call and advise.

## 2014-08-17 NOTE — Telephone Encounter (Signed)
I placed note at Dr. Allie Bossier desk for hard copy.

## 2014-08-21 MED ORDER — IMMUNE GLOBULIN (HUMAN) 1 GM/5ML ~~LOC~~ SOLN: SUBCUTANEOUS | Status: DC

## 2014-08-21 NOTE — Telephone Encounter (Signed)
Patient calling to check status of Hard copy.  Please call and advise.

## 2014-08-21 NOTE — Telephone Encounter (Signed)
I wrote a paper script for Hyzentra/ subcutaneous immune-globolin over a week ago.

## 2014-08-22 ENCOUNTER — Other Ambulatory Visit: Payer: Self-pay | Admitting: Neurology

## 2014-08-22 NOTE — Telephone Encounter (Signed)
Paper script, noted immuneglobulin per injection, [facility]- Hizentra.  She redid prescription and is now ready for pick up.  I called pt and she will pick up today.  Will be placed up front.

## 2014-09-19 ENCOUNTER — Encounter: Payer: Self-pay | Admitting: Neurology

## 2014-10-23 ENCOUNTER — Ambulatory Visit (INDEPENDENT_AMBULATORY_CARE_PROVIDER_SITE_OTHER): Payer: Managed Care, Other (non HMO) | Admitting: Neurology

## 2014-10-23 ENCOUNTER — Encounter: Payer: Self-pay | Admitting: Neurology

## 2014-10-23 VITALS — BP 110/58 | HR 74 | Resp 16 | Ht 66.0 in | Wt 152.0 lb

## 2014-10-23 DIAGNOSIS — G7000 Myasthenia gravis without (acute) exacerbation: Secondary | ICD-10-CM | POA: Insufficient documentation

## 2014-10-23 DIAGNOSIS — G4733 Obstructive sleep apnea (adult) (pediatric): Secondary | ICD-10-CM

## 2014-10-23 DIAGNOSIS — G4730 Sleep apnea, unspecified: Secondary | ICD-10-CM | POA: Insufficient documentation

## 2014-10-23 DIAGNOSIS — Z9989 Dependence on other enabling machines and devices: Secondary | ICD-10-CM | POA: Insufficient documentation

## 2014-10-23 DIAGNOSIS — G4710 Hypersomnia, unspecified: Secondary | ICD-10-CM | POA: Insufficient documentation

## 2014-10-23 MED ORDER — MODAFINIL 100 MG PO TABS: 100.0000 mg | ORAL_TABLET | Freq: Every day | ORAL | Status: DC
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
## 2014-10-23 NOTE — Patient Instructions (Signed)
Modafinil tablets  What is this medicine?  MODAFINIL (moe DAF i nil) is used to treat excessive sleepiness caused by certain sleep disorders. This includes narcolepsy, sleep apnea, and shift work sleep disorder.  This medicine may be used for other purposes; ask your health care provider or pharmacist if you have questions.  COMMON BRAND NAME(S): Provigil  What should I tell my health care provider before I take this medicine?  They need to know if you have any of these conditions:  -history of depression, mania, or other mental disorder  -kidney disease  -liver disease  -an unusual or allergic reaction to modafinil, other medicines, foods, dyes, or preservatives  -pregnant or trying to get pregnant  -breast-feeding  How should I use this medicine?  Take this medicine by mouth with a glass of water. Follow the directions on the prescription label. Take your doses at regular intervals. Do not take your medicine more often than directed. Do not stop taking except on your doctor's advice.  A special MedGuide will be given to you by the pharmacist with each prescription and refill. Be sure to read this information carefully each time.  Talk to your pediatrician regarding the use of this medicine in children. This medicine is not approved for use in children.  Overdosage: If you think you have taken too much of this medicine contact a poison control center or emergency room at once.  NOTE: This medicine is only for you. Do not share this medicine with others.  What if I miss a dose?  If you miss a dose, take it as soon as you can. If it is almost time for your next dose, take only that dose. Do not take double or extra doses.  What may interact with this medicine?  Do not take this medicine with any of the following medications:  -amphetamine or dextroamphetamine  -dexmethylphenidate or methylphenidate  -medicines called MAO Inhibitors like Nardil, Parnate, Marplan, Eldepryl  -pemoline  -procarbazine  This medicine may  also interact with the following medications:  -antifungal medicines like itraconazole or ketoconazole  -barbiturates like phenobarbital  -birth control pills or other hormone-containing birth control devices or implants  -carbamazepine  -cyclosporine  -diazepam  -medicines for depression, anxiety, or psychotic disturbances  -phenytoin  -propranolol  -triazolam  -warfarin  This list may not describe all possible interactions. Give your health care provider a list of all the medicines, herbs, non-prescription drugs, or dietary supplements you use. Also tell them if you smoke, drink alcohol, or use illegal drugs. Some items may interact with your medicine.  What should I watch for while using this medicine?  Visit your doctor or health care professional for regular checks on your progress. The full effects of this medicine may not be seen right away.  This medicine may affect your concentration, function, or may hide signs that you are tired. You may get dizzy. Do not drive, use machinery, or do anything that needs mental alertness until you know how this drug affects you. Alcohol can make you more dizzy and may interfere with your response to this medicine or your alertness. Avoid alcoholic drinks.  Birth control pills may not work properly while you are taking this medicine. Talk to your doctor about using an extra method of birth control.  It is unknown if the effects of this medicine will be increased by the use of caffeine. Caffeine is available in many foods, beverages, and medications. Ask your doctor if you should limit or   swelling of the face, lips, or tongue -anxiety -breathing problems -chest pain -fast, irregular  heartbeat -hallucinations -increased blood pressure -redness, blistering, peeling or loosening of the skin, including inside the mouth -sore throat, fever, or chills -suicidal thoughts or other mood changes -tremors -vomiting Side effects that usually do not require medical attention (report to your doctor or health care professional if they continue or are bothersome): -headache -nausea, diarrhea, or stomach upset -nervousness -trouble sleeping This list may not describe all possible side effects. Call your doctor for medical advice about side effects. You may report side effects to FDA at 1-800-FDA-1088. Where should I keep my medicine? Keep out of the reach of children. This medicine can be abused. Keep your medicine in a safe place to protect it from theft. Do not share this medicine with anyone. Selling or giving away this medicine is dangerous and against the law. Store at room temperature between 20 and 25 degrees C (68 and 77 degrees F). Throw away any unused medicine after the expiration date. NOTE: This sheet is a summary. It may not cover all possible information. If you have questions about this medicine, talk to your doctor, pharmacist, or health care provider.  2015, Elsevier/Gold Standard. (2009-08-06 21:07:42)

## 2014-10-25 ENCOUNTER — Telehealth: Payer: Self-pay | Admitting: Neurology

## 2014-10-25 NOTE — Telephone Encounter (Signed)
I called the pharmacy to obtain PA info.  Spoke with Raquel Sarna.  She said we needed to send info to [facility] ID # [id number].  I have contacted them and provided clinical info Ref Key # [id number].  I called the patient back.  She asked that we contact [employer] instead.  I have contacted them and provided clinical info Ref Key# [id number].  Patient is aware.

## 2014-10-25 NOTE — Telephone Encounter (Signed)
Pt is calling stating that her Rx modafinil (PROVIGIL) 100 MG tablet needs prior auth.  She needs to know what she needs to do.  Please call and advise.

## 2014-10-30 ENCOUNTER — Telehealth: Payer: Self-pay

## 2014-10-30 NOTE — Telephone Encounter (Signed)
[facility] (they have acquired [employer]) has approved the request for coverage on Modafinil effective until 04/23/2015 Ref # [id number] I spoke with the patient, she is aware.

## 2014-11-23 ENCOUNTER — Encounter: Payer: Self-pay | Admitting: Neurology

## 2014-11-28 ENCOUNTER — Telehealth: Payer: Self-pay | Admitting: Neurology

## 2014-11-28 DIAGNOSIS — G7000 Myasthenia gravis without (acute) exacerbation: Secondary | ICD-10-CM

## 2014-11-28 MED ORDER — PYRIDOSTIGMINE BROMIDE 60 MG PO TABS: ORAL_TABLET | ORAL | Status: DC

## 2014-11-28 NOTE — Telephone Encounter (Signed)
Pt is calling and would like a call back to discuss possibility of change the dose of medication or Blepharoplasty.  Please call and advise.

## 2014-11-28 NOTE — Telephone Encounter (Signed)
Please contact me on my  Chart, [name]!  Blepharoplasty is a tricky business and a disease that affects you day by day to a different degree.  It can easily lead to an overcorrection which is an undesirable effect. 4 times a day Mestinon is usually a sufficient dose.  You feel that the ptosis is dependent on the Mestinon  intake time and just wears off quicker?  Marland Kitchen In that case I would be happy to add a fifth dose daily if tolerated. Felicia Cain

## 2014-11-28 NOTE — Telephone Encounter (Signed)
I called and LMVM for pt to reach Dr. Brett Fairy on [place].   I did read the note to her as well in the VM.

## 2015-01-29 ENCOUNTER — Ambulatory Visit: Payer: Managed Care, Other (non HMO) | Admitting: Neurology

## 2015-02-13 ENCOUNTER — Encounter: Payer: Self-pay | Admitting: Internal Medicine

## 2015-02-13 ENCOUNTER — Encounter: Payer: Self-pay | Admitting: Neurology

## 2015-02-14 ENCOUNTER — Ambulatory Visit (INDEPENDENT_AMBULATORY_CARE_PROVIDER_SITE_OTHER): Payer: Managed Care, Other (non HMO) | Admitting: Neurology

## 2015-02-14 ENCOUNTER — Encounter: Payer: Self-pay | Admitting: Neurology

## 2015-02-14 VITALS — BP 104/60 | HR 70 | Resp 20 | Ht 66.54 in | Wt 149.0 lb

## 2015-02-14 DIAGNOSIS — G4730 Sleep apnea, unspecified: Secondary | ICD-10-CM

## 2015-02-14 DIAGNOSIS — G4710 Hypersomnia, unspecified: Secondary | ICD-10-CM | POA: Diagnosis not present

## 2015-02-14 DIAGNOSIS — G7000 Myasthenia gravis without (acute) exacerbation: Secondary | ICD-10-CM

## 2015-02-14 DIAGNOSIS — Z7189 Other specified counseling: Secondary | ICD-10-CM | POA: Diagnosis not present

## 2015-02-14 MED ORDER — METHYLPHENIDATE HCL 10 MG PO TABS: 10.0000 mg | ORAL_TABLET | Freq: Two times a day (BID) | ORAL | Status: DC
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
## 2015-02-28 ENCOUNTER — Other Ambulatory Visit: Payer: Self-pay

## 2015-02-28 DIAGNOSIS — G4733 Obstructive sleep apnea (adult) (pediatric): Secondary | ICD-10-CM

## 2015-03-19 ENCOUNTER — Encounter: Payer: Self-pay | Admitting: Genetic Counselor

## 2015-04-23 ENCOUNTER — Encounter: Payer: Self-pay | Admitting: *Deleted

## 2015-05-23 ENCOUNTER — Telehealth: Payer: Self-pay

## 2015-05-23 ENCOUNTER — Ambulatory Visit: Payer: Managed Care, Other (non HMO) | Admitting: Neurology

## 2015-05-23 NOTE — Telephone Encounter (Signed)
Patient will call back and reschedule her apt. With Dr. Brett Fairy today. Dr. Brett Fairy is out of the office ill. Patient will wait to see Dr. Brett Fairy.

## 2015-05-24 ENCOUNTER — Encounter: Payer: Self-pay | Admitting: Cardiovascular Disease

## 2015-06-06 ENCOUNTER — Other Ambulatory Visit: Payer: Self-pay

## 2015-06-06 DIAGNOSIS — G4733 Obstructive sleep apnea (adult) (pediatric): Secondary | ICD-10-CM

## 2015-06-11 NOTE — Addendum Note (Signed)
Addended by: Lester [place] A on: 06/11/2015 11:16 AM   Modules accepted: Orders

## 2015-06-20 ENCOUNTER — Telehealth: Payer: Self-pay | Admitting: Neurology

## 2015-06-21 NOTE — Telephone Encounter (Signed)
Dr. Gerald Dexter had corneal surgery and still reports irritation of the eyelids and an inability to sleep well.  She would like to postpone the sleep study until she feels that her eyes have healed enough to give an accurate picture of her sleep habits.CD

## 2015-07-19 ENCOUNTER — Emergency Department (HOSPITAL_COMMUNITY): Payer: Managed Care, Other (non HMO)

## 2015-07-19 ENCOUNTER — Encounter (HOSPITAL_COMMUNITY): Payer: Self-pay

## 2015-07-19 ENCOUNTER — Observation Stay (HOSPITAL_COMMUNITY)
Admission: EM | Admit: 2015-07-19 | Discharge: 2015-07-21 | Disposition: A | Discharge status: Home / Self Care | Payer: Managed Care, Other (non HMO) | Attending: Internal Medicine | Admitting: Internal Medicine

## 2015-07-19 DIAGNOSIS — G4733 Obstructive sleep apnea (adult) (pediatric): Secondary | ICD-10-CM | POA: Diagnosis not present

## 2015-07-19 DIAGNOSIS — M549 Dorsalgia, unspecified: Secondary | ICD-10-CM | POA: Diagnosis present

## 2015-07-19 DIAGNOSIS — Z79899 Other long term (current) drug therapy: Secondary | ICD-10-CM | POA: Diagnosis not present

## 2015-07-19 DIAGNOSIS — Z7982 Long term (current) use of aspirin: Secondary | ICD-10-CM | POA: Insufficient documentation

## 2015-07-19 DIAGNOSIS — G7000 Myasthenia gravis without (acute) exacerbation: Secondary | ICD-10-CM | POA: Diagnosis not present

## 2015-07-19 DIAGNOSIS — G629 Polyneuropathy, unspecified: Secondary | ICD-10-CM | POA: Insufficient documentation

## 2015-07-19 DIAGNOSIS — Z9013 Acquired absence of bilateral breasts and nipples: Secondary | ICD-10-CM | POA: Diagnosis not present

## 2015-07-19 DIAGNOSIS — I959 Hypotension, unspecified: Secondary | ICD-10-CM | POA: Insufficient documentation

## 2015-07-19 DIAGNOSIS — Z9989 Dependence on other enabling machines and devices: Secondary | ICD-10-CM | POA: Diagnosis present

## 2015-07-19 DIAGNOSIS — M79642 Pain in left hand: Secondary | ICD-10-CM | POA: Diagnosis not present

## 2015-07-19 DIAGNOSIS — M545 Low back pain, unspecified: Secondary | ICD-10-CM | POA: Diagnosis present

## 2015-07-19 DIAGNOSIS — D72829 Elevated white blood cell count, unspecified: Secondary | ICD-10-CM | POA: Diagnosis present

## 2015-07-19 DIAGNOSIS — G90529 Complex regional pain syndrome I of unspecified lower limb: Secondary | ICD-10-CM | POA: Diagnosis present

## 2015-07-19 DIAGNOSIS — R000 Tachycardia, unspecified: Secondary | ICD-10-CM | POA: Insufficient documentation

## 2015-07-19 DIAGNOSIS — Z853 Personal history of malignant neoplasm of breast: Secondary | ICD-10-CM | POA: Diagnosis not present

## 2015-07-19 DIAGNOSIS — R6510 Systemic inflammatory response syndrome (SIRS) of non-infectious origin without acute organ dysfunction: Principal | ICD-10-CM | POA: Diagnosis present

## 2015-07-19 DIAGNOSIS — Z1371 Encounter for nonprocreative screening for genetic disease carrier status: Secondary | ICD-10-CM | POA: Diagnosis not present

## 2015-07-19 DIAGNOSIS — I9589 Other hypotension: Secondary | ICD-10-CM | POA: Diagnosis present

## 2015-07-19 DIAGNOSIS — R262 Difficulty in walking, not elsewhere classified: Secondary | ICD-10-CM | POA: Insufficient documentation

## 2015-07-19 DIAGNOSIS — G8929 Other chronic pain: Secondary | ICD-10-CM | POA: Diagnosis not present

## 2015-07-19 DIAGNOSIS — M5126 Other intervertebral disc displacement, lumbar region: Secondary | ICD-10-CM | POA: Insufficient documentation

## 2015-07-19 DIAGNOSIS — G4730 Sleep apnea, unspecified: Secondary | ICD-10-CM | POA: Diagnosis present

## 2015-07-19 DIAGNOSIS — Z9889 Other specified postprocedural states: Secondary | ICD-10-CM

## 2015-07-19 DIAGNOSIS — W19XXXA Unspecified fall, initial encounter: Secondary | ICD-10-CM | POA: Diagnosis not present

## 2015-07-19 DIAGNOSIS — D0510 Intraductal carcinoma in situ of unspecified breast: Secondary | ICD-10-CM | POA: Diagnosis present

## 2015-07-19 LAB — CBC WITH DIFFERENTIAL/PLATELET
BASOS PCT: 0 %
Basophils Absolute: 0 10*3/uL (ref 0.0–0.1)
EOS ABS: 0 10*3/uL (ref 0.0–0.7)
EOS PCT: 0 %
HCT: 40.1 % (ref 36.0–46.0)
HEMOGLOBIN: 13.7 g/dL (ref 12.0–15.0)
LYMPHS ABS: 0.9 10*3/uL (ref 0.7–4.0)
Lymphocytes Relative: 5 %
MCH: 30.4 pg (ref 26.0–34.0)
MCHC: 34.2 g/dL (ref 30.0–36.0)
MCV: 89.1 fL (ref 78.0–100.0)
MONO ABS: 1.2 10*3/uL — AB (ref 0.1–1.0)
MONOS PCT: 6 %
NEUTROS PCT: 89 %
Neutro Abs: 17.5 10*3/uL — ABNORMAL HIGH (ref 1.7–7.7)
PLATELETS: 259 10*3/uL (ref 150–400)
RBC: 4.5 MIL/uL (ref 3.87–5.11)
RDW: 12.8 % (ref 11.5–15.5)
WBC: 19.6 10*3/uL — ABNORMAL HIGH (ref 4.0–10.5)

## 2015-07-19 LAB — BASIC METABOLIC PANEL
Anion gap: 11 (ref 5–15)
BUN: 16 mg/dL (ref 6–20)
CALCIUM: 9.3 mg/dL (ref 8.9–10.3)
CO2: 27 mmol/L (ref 22–32)
CREATININE: 0.68 mg/dL (ref 0.44–1.00)
Chloride: 99 mmol/L — ABNORMAL LOW (ref 101–111)
Glucose, Bld: 121 mg/dL — ABNORMAL HIGH (ref 65–99)
Potassium: 3.7 mmol/L (ref 3.5–5.1)
SODIUM: 137 mmol/L (ref 135–145)

## 2015-07-19 LAB — I-STAT CG4 LACTIC ACID, ED: Lactic Acid, Venous: 0.81 mmol/L (ref 0.5–2.0)

## 2015-07-19 LAB — URINALYSIS, ROUTINE W REFLEX MICROSCOPIC
Bilirubin Urine: NEGATIVE
GLUCOSE, UA: NEGATIVE mg/dL
Ketones, ur: NEGATIVE mg/dL
Leukocytes, UA: NEGATIVE
Nitrite: NEGATIVE
PROTEIN: NEGATIVE mg/dL
SPECIFIC GRAVITY, URINE: 1.009 (ref 1.005–1.030)
UROBILINOGEN UA: 0.2 mg/dL (ref 0.0–1.0)
pH: 7 (ref 5.0–8.0)

## 2015-07-19 LAB — URINE MICROSCOPIC-ADD ON

## 2015-07-19 LAB — HEPATIC FUNCTION PANEL
ALK PHOS: 62 U/L (ref 38–126)
ALT: 27 U/L (ref 14–54)
AST: 35 U/L (ref 15–41)
Albumin: 4 g/dL (ref 3.5–5.0)
Bilirubin, Direct: 0.2 mg/dL (ref 0.1–0.5)
Indirect Bilirubin: 0.6 mg/dL (ref 0.3–0.9)
TOTAL PROTEIN: 7.2 g/dL (ref 6.5–8.1)
Total Bilirubin: 0.8 mg/dL (ref 0.3–1.2)

## 2015-07-19 LAB — INFLUENZA PANEL BY PCR (TYPE A & B)
H1N1FLUPCR: NOT DETECTED
Influenza A By PCR: NEGATIVE
Influenza B By PCR: NEGATIVE

## 2015-07-19 LAB — MRSA PCR SCREENING: MRSA BY PCR: NEGATIVE

## 2015-07-19 LAB — LACTIC ACID, PLASMA: Lactic Acid, Venous: 1 mmol/L (ref 0.5–2.0)

## 2015-07-19 LAB — CK: CK TOTAL: 45 U/L (ref 38–234)

## 2015-07-19 LAB — SEDIMENTATION RATE: Sed Rate: 17 mm/hr (ref 0–22)

## 2015-07-19 MED ORDER — ENOXAPARIN SODIUM 40 MG/0.4ML ~~LOC~~ SOLN
40.0000 mg | SUBCUTANEOUS | Status: DC
  Administered 2015-07-19 – 2015-07-20 (×2): 40 mg via SUBCUTANEOUS
  Filled 2015-07-19 (×3): qty 0.4

## 2015-07-19 MED ORDER — CYCLOBENZAPRINE HCL 5 MG PO TABS
7.5000 mg | ORAL_TABLET | Freq: Three times a day (TID) | ORAL | Status: DC | PRN
  Administered 2015-07-19 – 2015-07-20 (×2): 7.5 mg via ORAL
  Filled 2015-07-19: qty 1.5
  Filled 2015-07-19: qty 2

## 2015-07-19 MED ORDER — SODIUM CHLORIDE 0.9 % IJ SOLN: 3.0000 mL | Freq: Two times a day (BID) | INTRAMUSCULAR | Status: DC

## 2015-07-19 MED ORDER — SODIUM CHLORIDE 0.9 % IV BOLUS (SEPSIS)
1000.0000 mL | Freq: Once | INTRAVENOUS | Status: AC
  Administered 2015-07-19: 1000 mL via INTRAVENOUS

## 2015-07-19 MED ORDER — OXYCODONE HCL 5 MG PO TABS
5.0000 mg | ORAL_TABLET | ORAL | Status: DC | PRN
Start: 2015-07-19 — End: 2015-07-20
  Administered 2015-07-20: 5 mg via ORAL
  Filled 2015-07-19: qty 1

## 2015-07-19 MED ORDER — ACETAMINOPHEN 325 MG PO TABS: 650.0000 mg | ORAL_TABLET | Freq: Four times a day (QID) | ORAL | Status: DC | PRN

## 2015-07-19 MED ORDER — DOCUSATE SODIUM 100 MG PO CAPS
100.0000 mg | ORAL_CAPSULE | Freq: Two times a day (BID) | ORAL | Status: DC
  Administered 2015-07-19 – 2015-07-20 (×2): 100 mg via ORAL
  Filled 2015-07-19 (×2): qty 1

## 2015-07-19 MED ORDER — MORPHINE SULFATE (PF) 2 MG/ML IV SOLN: 1.0000 mg | INTRAVENOUS | Status: DC | PRN

## 2015-07-19 MED ORDER — LIFITEGRAST 5 % OP SOLN
1.0000 [drp] | Freq: Two times a day (BID) | OPHTHALMIC | Status: DC
  Administered 2015-07-20: 1 [drp] via OPHTHALMIC

## 2015-07-19 MED ORDER — ACETAMINOPHEN 500 MG PO TABS
1000.0000 mg | ORAL_TABLET | Freq: Once | ORAL | Status: AC
  Administered 2015-07-19: 1000 mg via ORAL
  Filled 2015-07-19 (×2): qty 2

## 2015-07-19 MED ORDER — ONDANSETRON HCL 4 MG/2ML IJ SOLN
4.0000 mg | Freq: Once | INTRAMUSCULAR | Status: AC
  Administered 2015-07-19: 4 mg via INTRAVENOUS
  Filled 2015-07-19: qty 2

## 2015-07-19 MED ORDER — ASPIRIN EC 81 MG PO TBEC
81.0000 mg | DELAYED_RELEASE_TABLET | Freq: Every day | ORAL | Status: DC
  Administered 2015-07-19 – 2015-07-21 (×3): 81 mg via ORAL
  Filled 2015-07-19 (×4): qty 1

## 2015-07-19 MED ORDER — WHITE PETROLATUM GEL
Status: AC
  Administered 2015-07-19: 0.2
  Filled 2015-07-19: qty 1

## 2015-07-19 MED ORDER — DOXYCYCLINE HYCLATE 100 MG PO TABS
100.0000 mg | ORAL_TABLET | Freq: Every day | ORAL | Status: DC
Start: 2015-07-19 — End: 2015-07-21
  Administered 2015-07-19 – 2015-07-21 (×3): 100 mg via ORAL
  Filled 2015-07-19 (×4): qty 1

## 2015-07-19 MED ORDER — LIDOCAINE 5 % EX PTCH
1.0000 | MEDICATED_PATCH | CUTANEOUS | Status: DC
  Administered 2015-07-19: 1 via TRANSDERMAL
  Filled 2015-07-19 (×2): qty 1

## 2015-07-19 MED ORDER — ACETAMINOPHEN 500 MG PO TABS: 1000.0000 mg | ORAL_TABLET | Freq: Once | ORAL | Status: DC

## 2015-07-19 MED ORDER — MORPHINE SULFATE (PF) 4 MG/ML IV SOLN
4.0000 mg | Freq: Once | INTRAVENOUS | Status: AC
  Administered 2015-07-19: 4 mg via INTRAVENOUS
  Filled 2015-07-19: qty 1

## 2015-07-19 MED ORDER — ALUM & MAG HYDROXIDE-SIMETH 200-200-20 MG/5ML PO SUSP: 30.0000 mL | Freq: Four times a day (QID) | ORAL | Status: DC | PRN

## 2015-07-19 MED ORDER — OXYCODONE HCL 5 MG PO TABS
5.0000 mg | ORAL_TABLET | ORAL | Status: DC | PRN
  Administered 2015-07-19: 5 mg via ORAL
  Filled 2015-07-19: qty 1

## 2015-07-19 MED ORDER — CYCLOSPORINE 0.05 % OP EMUL
1.0000 [drp] | Freq: Two times a day (BID) | OPHTHALMIC | Status: DC
  Administered 2015-07-19 – 2015-07-21 (×4): 1 [drp] via OPHTHALMIC
  Filled 2015-07-19 (×7): qty 1

## 2015-07-19 MED ORDER — GABAPENTIN 600 MG PO TABS
600.0000 mg | ORAL_TABLET | Freq: Every day | ORAL | Status: DC
Start: 2015-07-19 — End: 2015-07-21
  Administered 2015-07-19 – 2015-07-20 (×2): 600 mg via ORAL
  Filled 2015-07-19 (×3): qty 1

## 2015-07-19 MED ORDER — ONDANSETRON HCL 4 MG/2ML IJ SOLN: 4.0000 mg | Freq: Four times a day (QID) | INTRAMUSCULAR | Status: DC | PRN

## 2015-07-19 MED ORDER — DIAZEPAM 5 MG/ML IJ SOLN
5.0000 mg | Freq: Once | INTRAMUSCULAR | Status: AC
  Administered 2015-07-19: 5 mg via INTRAVENOUS
  Filled 2015-07-19: qty 2

## 2015-07-19 MED ORDER — GABAPENTIN 300 MG PO CAPS
300.0000 mg | ORAL_CAPSULE | Freq: Every day | ORAL | Status: DC
Start: 2015-07-19 — End: 2015-07-21
  Administered 2015-07-19 – 2015-07-21 (×3): 300 mg via ORAL
  Filled 2015-07-19 (×4): qty 1

## 2015-07-19 MED ORDER — SODIUM CHLORIDE 0.9 % IV SOLN
INTRAVENOUS | Status: DC
  Administered 2015-07-19 – 2015-07-20 (×2): via INTRAVENOUS

## 2015-07-19 MED ORDER — GADOBENATE DIMEGLUMINE 529 MG/ML IV SOLN
15.0000 mL | Freq: Once | INTRAVENOUS | Status: AC | PRN
  Administered 2015-07-19: 13 mL via INTRAVENOUS

## 2015-07-19 MED ORDER — DICLOFENAC SODIUM 1 % TD GEL
2.0000 g | Freq: Four times a day (QID) | TRANSDERMAL | Status: DC
  Administered 2015-07-19 – 2015-07-20 (×5): 2 g via TOPICAL
  Filled 2015-07-19: qty 100

## 2015-07-19 MED ORDER — VITAMIN D 1000 UNITS PO TABS
2000.0000 [IU] | ORAL_TABLET | Freq: Every day | ORAL | Status: DC
  Administered 2015-07-19 – 2015-07-21 (×3): 2000 [IU] via ORAL
  Filled 2015-07-19 (×5): qty 2

## 2015-07-19 MED ORDER — ACETAMINOPHEN 650 MG RE SUPP: 650.0000 mg | Freq: Four times a day (QID) | RECTAL | Status: DC | PRN

## 2015-07-19 MED ORDER — OMEGA-3-ACID ETHYL ESTERS 1 G PO CAPS
1.0000 g | ORAL_CAPSULE | Freq: Every day | ORAL | Status: DC
  Administered 2015-07-19 – 2015-07-21 (×3): 1 g via ORAL
  Filled 2015-07-19 (×4): qty 1

## 2015-07-19 NOTE — ED Notes (Signed)
Per EMS - pt from home. Pt hx chronic back pain, worsening over last year, worst yesterday. Pt took Diclofenac to help w/ pain (does not usually take this medication) - started having n/v earlier this morning. Pt c/o generalized weakness. Pt fell from pain when ambulating in restroom at home, c/o left wrist pain.

## 2015-07-19 NOTE — ED Provider Notes (Signed)
CSN: [id number]     Arrival date & time 07/19/15  [id number] History   First MD Initiated Contact with Patient 07/19/15 0631     Chief Complaint  Patient presents with  . Nausea  . Vomiting  . Medication Reaction     (Consider location/radiation/quality/duration/timing/severity/associated sxs/prior Treatment) HPI Comments: Patient is a 59 year old female with past medical history of breast cancer (s/p bilateral mastectomy) and chronic back pain (s/p L4-5 laminectomy, 2000 by Dr. Vertell Limber) who presents to the ED via EMS with complaint of worsening lower back pain, onset 2 days. Patient reports that over the past 2 days she has had worsening lower back pain that she describes as a a constant dull ache. She reports having intermittent sharp lower back pain with bending that radiates to her hips. She notes she took Diclofenac and Ultracet without relief. She states typically with her back pain her pain improves with Diclofenac, acupuncture and yoga. Patient reports getting acupuncture along her entire back and notes her last treatment was 2 weeks ago. She notes she had a scheduled appointment this afternoon for acupuncture but do to her pain worsening and been unable to get into her car she decided to come to the emergency department. She reports that last night she became nauseous after eating dinner and reports having 2 episodes of NBNB vomiting. Patient states while she was in the bathroom trying to bend down her pain became so severe causing her to fall. Denies head injury or LOC. Endorses left hand pain and left foot pain. Denies any anticoagulation. She also endorses having chills. Pt denies fever, numbness, tingling, saddle anesthesia, loss of bowel or bladder, weakness, IVDU. Denies nasal congestion, SOB, cough, CP, abdominal pain, diarrhea, constipation, urinary symptoms.   Past Medical History  Diagnosis Date  . Cancer ([place])   . Spinal stenosis   . Optic neuropathy   . Neuropathy ([place])   .  Neuromuscular disorder ([place])   . Esophageal stricture     stricture with dysphasia  . Optic neuritis     ischaemic optic neuritis, non arteric  . Brachial neuritis     neuropathy  . Posterior optic neuritis   . Ptosis of eyelid   . Myasthenia gravis in crisis [facility]) 04/2014    respiratory crisis  . H/O echocardiogram 2004, 2013  . History of nuclear stress test 2004    see scanned study  . H/O Doppler ultrasound 2006    see scanned study  . History of cardiac monitoring 2006   Past Surgical History  Procedure Laterality Date  . Breast surgery    . Masectomy  bilat  . Cesarean section    . Laminectomy      L-4-L5:2000  . Breast enhancement surgery      2000, after breast cancer surgery,redone 2010  . Back surgery      lumbar fusion, Dr [id number]   Family History  Problem Relation Age of Onset  . Stroke Mother   . Hypertension Mother   . Early death Father   . Cancer Father 26    sarcoma  . Cancer Maternal Uncle   . Cancer Paternal Grandmother     breast  . Parkinson's disease Maternal Aunt    Social History  Substance Use Topics  . Smoking status: Never Smoker   . Smokeless tobacco: None  . Alcohol Use: No   OB History    Gravida Para Term Preterm AB TAB SAB Ectopic Multiple Living   2    1  1   1     Review of Systems  Constitutional: Positive for chills.  Gastrointestinal: Positive for nausea and vomiting.  Musculoskeletal: Positive for back pain, joint swelling and arthralgias.  Neurological: Positive for weakness.  All other systems reviewed and are negative.     Allergies  Latex; Sulfa antibiotics; and Tape  Home Medications   Prior to Admission medications   Medication Sig Start Date End Date Taking? Authorizing Provider  aspirin 81 MG tablet Take 81 mg by mouth daily.      Historical Provider, MD  Black Cohosh 40 MG CAPS Take by mouth. One tablet daily    Historical Provider, MD  cholecalciferol (VITAMIN D) 1000 UNITS tablet Take 2,000  Units by mouth daily.    Historical Provider, MD  doxycycline (VIBRAMYCIN) 100 MG capsule Take 100 mg by mouth 2 (two) times daily.  05/16/12   Historical Provider, MD  esomeprazole (NEXIUM) 40 MG capsule Take 40 mg by mouth daily as needed.     Historical Provider, MD  gabapentin (NEURONTIN) 300 MG capsule Take by mouth. 300mg  po am and 600mg  po pm    Historical Provider, MD  methylphenidate (RITALIN) 10 MG tablet Take 1 tablet (10 mg total) by mouth 2 (two) times daily with breakfast and lunch. 02/14/15   Asencion Partridge Dohmeier, MD  modafinil (PROVIGIL) 100 MG tablet Take 1 tablet (100 mg total) by mouth daily. 10/23/14   Asencion Partridge Dohmeier, MD  pyridostigmine (MESTINON) 60 MG tablet TAKE  (1)  TABLET  FOUR TIMES DAILY. 11/28/14   Asencion Partridge Dohmeier, MD  sertraline (ZOLOFT) 25 MG tablet Take 25 mg by mouth daily.      Historical Provider, MD  traMADol-acetaminophen (ULTRACET) 37.5-325 MG per tablet Take 1 tablet by mouth every 6 (six) hours as needed. 09/25/13   Lanice Shirts, MD   BP 120/65 mmHg  Pulse 110  Temp(Src) 101.1 F (38.4 C)  Resp 19  SpO2 96% Physical Exam  Constitutional: She is oriented to person, place, and time. She appears well-developed and well-nourished. No distress.  HENT:  Head: Normocephalic and atraumatic.  Mouth/Throat: Oropharynx is clear and moist. No oropharyngeal exudate.  Eyes: Conjunctivae and EOM are normal. Pupils are equal, round, and reactive to light. Right eye exhibits no discharge. Left eye exhibits no discharge. No scleral icterus.  Neck: Normal range of motion. Neck supple.  No neck stiffness.  Cardiovascular: Regular rhythm, normal heart sounds and intact distal pulses.   No murmur heard. tachycardia  Pulmonary/Chest: Effort normal and breath sounds normal. No respiratory distress. She has no wheezes. She has no rales. She exhibits no tenderness.  Abdominal: Soft. Bowel sounds are normal. She exhibits no distension and no mass. There is no tenderness. There  is no rebound and no guarding.  Musculoskeletal: She exhibits edema and tenderness.       Cervical back: Normal. She exhibits normal range of motion, no tenderness, no bony tenderness, no swelling, no edema, no deformity, no laceration and no spasm.       Thoracic back: Normal. She exhibits no tenderness, no bony tenderness, no swelling, no edema, no deformity, no laceration and no spasm.       Lumbar back: She exhibits tenderness and bony tenderness (Lumbar midline tenderness.). She exhibits no swelling, no edema, no deformity, no laceration and no spasm.       Left hand: She exhibits decreased range of motion (due to pain), tenderness and swelling. She exhibits normal two-point discrimination, normal capillary refill, no  deformity and no laceration. Normal sensation noted. Decreased strength (dec. left grip strength due to pain) noted.       Hands:      Left foot: There is tenderness and bony tenderness. There is normal range of motion, no swelling, normal capillary refill, no crepitus, no deformity and no laceration.       Feet:  TTP at lumbar midline and paraspinal muscles, no deformity or spasm palpated. Pt unable to sit up in bed due to pain. Positive straight leg raise.  5/5 strength BLE, sensation intact, no saddle anesthesia, 2+ PT pulses, FROM BLE.   Lymphadenopathy:    She has no cervical adenopathy.  Neurological: She is alert and oriented to person, place, and time. No sensory deficit. Coordination normal.  Skin: Skin is warm and dry. She is not diaphoretic.  Nursing note and vitals reviewed.   ED Course  Procedures (including critical care time) Labs Review Labs Reviewed  CBC WITH DIFFERENTIAL/PLATELET  BASIC METABOLIC PANEL  URINALYSIS, ROUTINE W REFLEX MICROSCOPIC (NOT AT [facility])    Imaging Review No results found. I have personally reviewed and evaluated these images and lab results as part of my medical decision-making.   EKG Interpretation   Date/Time:  Friday  July 19 2015 06:37:51 EST Ventricular Rate:  107 PR Interval:  178 QRS Duration: 94 QT Interval:  337 QTC Calculation: 450 R Axis:   99 Text Interpretation:  Sinus tachycardia Borderline right axis deviation  When compared with ECG of 12/29/2010, No significant change was found  Confirmed by [facility]  MD, DAVID ([id number]) on 07/19/2015 6:47:09 AM      MDM   Final diagnoses:  Back pain  SIRS (systemic inflammatory response syndrome) (HCC)  Other specified hypotension    Pt presents with worsening chronic back pain and N/V, chills. No relief with Diclofenac or Ultram at home. Hx of breast cancer, spinal stenosis, lumbar laminectomy (Dr. Vertell Limber). Reports fall that occurred fall that occurred last night while vomiting, pain was so severe causing her legs to give out, denies head injury or LOC. Temp 101.1, HR 110. Exam revealed TTP at lumbar midline and paraspinal muscles. BLE neurovascularly intact, 5/5 strength. Pain and mild swelling to left hand and foot, otherwise neurovascularly intact. Positive straight leg raise. No neck stiffness or meningeal signs. Pt given IVF and pain meds. Discussed case with Dr. Darl Householder who evaluated pt.   EKG showed sinus tachycardia. WBC 19.6. Lumbar xray no acute abnormality. Left hand and foot xray negative. CXR showed no edema or consolidation. Lactic 0.81. Pt reports history of acupuncture, last tx 2 weeks ago. She also notes having epidural injection in 2005. MRI ordered to evaluate for epidural abscess and discitis. Lumbar MRI showed moderate central/left paracentral disc protrusion with severe bilateral recess stenosis, L5-S1 discectomy and right laminotomy with bilateral foraminal stenosis. Dr. Darl Householder consulted neurosurgery (Dr. Vertell Limber). Hospitalist consulted for admission of SIRS without a known source and pain control. Discussed results with pt and plan for admission.        Chesley Noon [place], [place] 07/20/15 [id number]  Wandra Arthurs, MD 07/20/15 [phone]

## 2015-07-19 NOTE — H&P (Addendum)
[facility] History and Physical                                                                                    Felicia Cain, is a 59 y.o. female  MRN: [id number]   DOB - Jul 11, 1956  Admit Date - 07/19/2015  Outpatient Primary MD for the patient is [name], MD  Referring MD: Darl Householder / ER  Consulting M.D: Vertell Limber / Neurosurgery; Caralyn Guile / Hand surgery  With History of -  Past Medical History  Diagnosis Date  . Cancer ([place])   . Spinal stenosis   . Optic neuropathy   . Neuropathy ([place])   . Neuromuscular disorder ([place])   . Esophageal stricture     stricture with dysphasia  . Optic neuritis     ischaemic optic neuritis, non arteric  . Brachial neuritis     neuropathy  . Posterior optic neuritis   . Ptosis of eyelid   . Myasthenia gravis in crisis [facility]) 04/2014    respiratory crisis  . H/O echocardiogram 2004, 2013  . History of nuclear stress test 2004    see scanned study  . H/O Doppler ultrasound 2006    see scanned study  . History of cardiac monitoring 2006      Past Surgical History  Procedure Laterality Date  . Breast surgery    . Masectomy  bilat  . Cesarean section    . Laminectomy      L-4-L5:2000  . Breast enhancement surgery      2000, after breast cancer surgery,redone 2010  . Back surgery      lumbar fusion, Dr [id number]    in for   Chief Complaint  Patient presents with  . Nausea  . Vomiting  . 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 Patient presented to the ER with complaints of severe back pain and fever. She reports that Wednesday she began having an exacerbation of her low back pain and used her typical combination of  pharmacological nonpharmacological management without any improvement in symptoms. She was unable to drive so attended a local convention with the assistance of a family member who drove her back and forth. She was able to sit through the convention without any significant worsening of her symptoms. As of yesterday she came back home, ate dinner as usual and decided to go to bed. She was awakened with chills rigors and nausea and worsening back pain. She took her temperature and found her temperature to be over 101. She subsequently took Tylenol. Unfortunately her symptoms worsened and she was unable to sleep. About 4 AM she can't quite nauseous and felt like she needed to go have emesis. While attempting to bend over the toilet she felt a sharp slashing's type pain in her low back. She was able to vomit but then fell backwards onto the bathroom floor without injuring herself. To keep  her back from hurting she had kept her hips flexed and her needs towards her chest. She was eventually able to crawl to her bedroom with the carpet was an rest until the spasm in her back improved enough where she could stand. She noticed that when her back pain was at the worst her left toe was crawling. Typically when she has exacerbations of back pain she has pharmacological measures prescribed by her neurosurgeon and/or neurologist. Her last treatment for acupuncture was 3 weeks prior. She's not had any dysuria, cough or rhinorrhea. She reports she took an influenza vaccine this year.  In the ER her initial temperature was 101.1, BP was 115/70, pulse 106, respirations 15 and room air sats 97%. She was given Tylenol and defervesced but then her systolic blood pressure dropped into the low 90s and she was subsequently given a total of 3 L normal saline fluid bolus. Her white count was found to be elevated at 19,600 and given her back pain there were concerned she may have discitis or other infectious process as etiology to the back  pain so an MRI of the lumbar spine is completed which revealed L4-5 moderate central/left paracentral disc protrusion and severe bilateral lateral recess stenosis. EDP discussed these results with the patient's primary neurosurgeon Dr. Vertell Limber with no new recommendations made. Patient had a focal area of point tenderness over the left lateral occiput but this was not associated with meningeal signs. In addition to fluid challenges patient was given morphine and Zofran in the ER with some relief of her back pain although she still continued with a positive straight leg raise test. Her lactic acid was normal at 0.81, in addition to leukocytosis she had elevated absolute neutrophils 17,500 with low monocytes at 1.2. She denies recent use of steroids or steroid injection. I panel was unremarkable except for mild hyperglycemia. Urinalysis was negative for infection. Because of fever and low blood pressure blood cultures were obtained. Chest x-ray was unremarkable for edema or infectious process. Patient's blood pressure remains somewhat suboptimal despite receiving 3 L of fluid with current blood pressure 99/60 with patient reports baseline blood pressures between 941 and 740 systolic.  **Patient requests "XXX" status for privacy   Review of Systems   In addition to the HPI above,  No Headache, changes with Vision or hearing, new weakness, tingling, numbness in any extremity, No problems swallowing food or Liquids, indigestion/reflux No Chest pain, Cough or Shortness of Breath, palpitations, orthopnea or DOE No Abdominal pain, no melena or hematochezia, no dark tarry stools No dysuria, hematuria or flank pain No new skin rashes, lesions, masses or bruises No recent weight gain or loss No polyuria, polydypsia or polyphagia,  *A full 10 point Review of Systems was done, except as stated above, all other Review of Systems were negative.  Social History Social History  Substance Use Topics  . Smoking  status: Never Smoker   . Smokeless tobacco: Not on file  . Alcohol Use: No    Resides at: Private residence  Lives with: Her son  Ambulatory status: Without assistive devices   Family History Family History  Problem Relation Age of Onset  . Stroke Mother   . Hypertension Mother   . Early death Father   . Cancer Father 38    sarcoma  . Cancer Maternal Uncle   . Cancer Paternal Grandmother     breast  . Parkinson's disease Maternal Aunt      Prior to Admission medications  Medication Sig Start Date End Date Taking? Authorizing Provider  aspirin 81 MG tablet Take 81 mg by mouth daily.     Yes Historical Provider, MD  cholecalciferol (VITAMIN D) 1000 UNITS tablet Take 2,000 Units by mouth daily.   Yes Historical Provider, MD  cycloSPORINE (RESTASIS) 0.05 % ophthalmic emulsion Place 1 drop into both eyes 2 (two) times daily.   Yes Historical Provider, MD  doxycycline (VIBRAMYCIN) 100 MG capsule Take 100 mg by mouth daily.  05/16/12  Yes Historical Provider, MD  gabapentin (NEURONTIN) 300 MG capsule Take by mouth. 368m po am and 6065mpo pm   Yes Historical Provider, MD  Lifitegrast (XShirley Friar5 % SOLN Apply 1 drop to eye 2 (two) times daily.   Yes Historical Provider, MD  Multiple Vitamins-Minerals (PRESERVISION AREDS 2 PO) Take 1 tablet by mouth 2 (two) times daily.   Yes Historical Provider, MD  Omega-3 Fatty Acids (FISH OIL) 1200 MG CAPS Take 1,200 mg by mouth daily.   Yes Historical Provider, MD    Allergies  Allergen Reactions  . Adderall [Amphetamine-Dextroamphetamine] Other (See Comments)    headache  . Latex Itching and Swelling  . Sulfa Antibiotics Swelling    Redness, topical  . Tape      Blistering on site, can only use paper tape or surgical.    Physical Exam  Vitals  Blood pressure 99/60, pulse 73, temperature 98.8 F (37.1 C), temperature source Oral, resp. rate 18, SpO2 98 %.   General:  In no acute distress, appears healthy and well  nourished  Psych:  Normal affect, Denies Suicidal or Homicidal ideations, Awake Alert, Oriented X 3. Speech and thought patterns are clear and appropriate, no apparent short term memory deficits  Neuro:   No focal neurological deficits, CN II through XII intact, Strength 5/5 all 4 extremities, Sensation intact all 4 extremities. Positive bilateral straight leg raise  ENT:  Ears and Eyes appear Normal, Conjunctivae clear, PER. Moist oral mucosa without erythema or exudates.  Neck:  Supple, No lymphadenopathy appreciated  Respiratory:  Symmetrical chest wall movement, Good air movement bilaterally, CTAB. Room Air  Cardiac:  RRR, No Murmurs, no LE edema noted, no JVD, No carotid bruits, peripheral pulses palpable at 2+  Abdomen:  Positive bowel sounds, Soft, Non tender, Non distended,  No masses appreciated, no obvious hepatosplenomegaly  Skin:  No Cyanosis, Normal Skin Turgor, No Skin Rash or Bruise.  Extremities: Symmetrical without obvious trauma or injury,  no effusions-she does have a focal area of edema on the dorsum of the left hand in the center just above the wrist that is exquisitely tender to minimal tactile stimulation  Data Review  CBC  Recent Labs Lab 07/19/15 0734  WBC 19.6*  HGB 13.7  HCT 40.1  PLT 259  MCV 89.1  MCH 30.4  MCHC 34.2  RDW 12.8  LYMPHSABS 0.9  MONOABS 1.2*  EOSABS 0.0  BASOSABS 0.0    Chemistries   Recent Labs Lab 07/19/15 0734  NA 137  K 3.7  CL 99*  CO2 27  GLUCOSE 121*  BUN 16  CREATININE 0.68  CALCIUM 9.3    CrCl cannot be calculated (Unknown ideal weight.).  No results for input(s): TSH, T4TOTAL, T3FREE, THYROIDAB in the last 72 hours.  Invalid input(s): FREET3  Coagulation profile No results for input(s): INR, PROTIME in the last 168 hours.  No results for input(s): DDIMER in the last 72 hours.  Cardiac Enzymes No results for input(s): CKMB, TROPONINI, MYOGLOBIN  in the last 168 hours.  Invalid input(s):  CK  Invalid input(s): POCBNP  Urinalysis    Component Value Date/Time   COLORURINE YELLOW 07/19/2015 0726   APPEARANCEUR CLEAR 07/19/2015 0726   LABSPEC 1.009 07/19/2015 0726   PHURINE 7.0 07/19/2015 0726   GLUCOSEU NEGATIVE 07/19/2015 0726   HGBUR TRACE* 07/19/2015 0726   BILIRUBINUR NEGATIVE 07/19/2015 0726   BILIRUBINUR neg 09/25/2013 1020   KETONESUR NEGATIVE 07/19/2015 0726   PROTEINUR NEGATIVE 07/19/2015 0726   PROTEINUR neg 09/25/2013 1020   UROBILINOGEN 0.2 07/19/2015 0726   UROBILINOGEN negative 09/25/2013 1020   NITRITE NEGATIVE 07/19/2015 0726   NITRITE neg 09/25/2013 1020   LEUKOCYTESUR NEGATIVE 07/19/2015 0726    Imaging results:   Dg Chest 2 View  07/19/2015  CLINICAL DATA:  Chronic back pain and weakness ; fever EXAM: CHEST  2 VIEW COMPARISON:  Chest CT October 07, 2012; chest radiograph December 29, 2010 FINDINGS: The lungs are clear. Heart size and pulmonary vascularity are normal. No adenopathy. There are breast implants bilaterally. No bone lesions. IMPRESSION: Breast implants bilaterally.  No edema or consolidation. Electronically Signed   By: Lowella Grip III M.D.   On: 07/19/2015 08:21   Dg Lumbar Spine Complete  07/19/2015  CLINICAL DATA:  Chronic low back pain which has worsened over the past year with an acute increase yesterday. No known injury. Initial encounter. EXAM: LUMBAR SPINE - COMPLETE 4+ VIEW COMPARISON:  Plain films lumbar spine from an outside facility 03/13/2015. MRI lumbar spine from an outside facility 05/04/2014. FINDINGS: Vertebral body height and alignment are maintained. Loss of disc space height and endplate sclerosis at T3-M4 appear unchanged. No pars interarticularis defect is identified. IMPRESSION: No acute abnormality. Degenerative disc disease L5-S1. Electronically Signed   By: Inge Rise M.D.   On: 07/19/2015 08:22   Mr Lumbar Spine W Wo Contrast  07/19/2015  CLINICAL DATA:  Chronic back pain worsening over the last  year. History of back surgery. EXAM: MRI LUMBAR SPINE WITHOUT AND WITH CONTRAST TECHNIQUE: Multiplanar and multiecho pulse sequences of the lumbar spine were obtained without and with intravenous contrast. CONTRAST:  6m MULTIHANCE GADOBENATE DIMEGLUMINE 529 MG/ML IV SOLN COMPARISON:  05/04/2014 FINDINGS: The vertebral bodies of the lumbar spine are normal in size. The vertebral bodies of the lumbar spine are normal in alignment. There is normal bone marrow signal demonstrated throughout the vertebra. There is severe degenerative disc disease with disc height loss at L5-S1 with Modic endplate changes. There is disc desiccation at L4-5. There is disc height loss at T11-12. The disc heights are otherwise maintained. The spinal cord is normal in signal and contour. The cord terminates normally at T12-L1 . The nerve roots of the cauda equina and the filum terminale are normal. The visualized portions of the SI joints are unremarkable. The imaged intra-abdominal contents are unremarkable. T11-12: Mild broad-based disc bulge. No foraminal or central canal stenosis. T12-L1: No significant disc bulge. No evidence of neural foraminal stenosis. No central canal stenosis. L1-L2: No significant disc bulge. No evidence of neural foraminal stenosis. No central canal stenosis. L2-L3: No significant disc bulge. No evidence of neural foraminal stenosis. No central canal stenosis. Mild bilateral facet arthropathy. L3-L4: No significant disc bulge. No evidence of neural foraminal stenosis. No central canal stenosis. L4-L5: Moderate central/ left paracentral disc protrusion with severe bilateral lateral recess stenosis. No evidence of neural foraminal stenosis. Mild bilateral facet arthropathy. Mild spinal stenosis. L5-S1: Prior discectomy and right laminotomy defect. Mild broad-based disc  osteophyte complex. Mild bilateral foraminal stenosis. Mild bilateral facet arthropathy. No central canal stenosis. IMPRESSION: 1. At L4-5 there is  a moderate central/ left paracentral disc protrusion with severe bilateral lateral recess stenosis. Mild bilateral facet arthropathy. Multifactorial mild spinal stenosis. 2. At L5-S1 there is prior discectomy and right laminotomy defect. Mild broad-based disc osteophyte complex. Mild bilateral foraminal stenosis. Mild bilateral facet arthropathy. Electronically Signed   By: Kathreen Devoid   On: 07/19/2015 09:59   Dg Hand Complete Left  07/19/2015  CLINICAL DATA:  Fall while walking a restroom with hand pain, initial encounter EXAM: LEFT HAND - COMPLETE 3+ VIEW COMPARISON:  01/29/2012 FINDINGS: There is no evidence of fracture or dislocation. There is no evidence of arthropathy or other focal bone abnormality. Soft tissues are unremarkable. IMPRESSION: No acute abnormality noted. Electronically Signed   By: Inez Catalina M.D.   On: 07/19/2015 08:19   Dg Foot Complete Left  07/19/2015  CLINICAL DATA:  Fall today while walking to restroom with foot pain, initial encounter EXAM: LEFT FOOT - COMPLETE 3+ VIEW COMPARISON:  06/08/2012 FINDINGS: Stable calcifications are noted adjacent to the fifth distal phalanx. These are likely related to prior trauma. No acute fracture or dislocation is noted. No gross soft tissue abnormality is seen. IMPRESSION: No acute abnormality noted. Electronically Signed   By: Inez Catalina M.D.   On: 07/19/2015 08:23     EKG: (Independently reviewed)  sinus tachycardia with ventricular rate 107 bpm, QTC 450 ms, no ischemic changes   Assessment & Plan  Principal Problem:   SIRS (systemic inflammatory response syndrome) (HCC) /Leukocytosis  -Admit to stepdown secondary to suboptimal blood pressure readings -No definitive source of infection despite significant leukocytosis; differential includes viral syndrome versus myositis (check ESR; CK 45) -Check influenza PCR -Treat symptomatically with IV fluids -No indication for antibiotics at this juncture since unclear what we are  actually treating -Lactic acid normal but will repeat at least one more time to make sure has not begun to trend upwards -check LFTs -Follow-up on blood cultures -Repeat CBC in a.m.  Active Problems:   Acute exacerbation of chronic low back pain/S/P laminectomy/RSD lower limb -No evidence on exam or diagnostic evaluation that pain is secondary to infectious process -No abnormal neurological signs on evaluation -Continue preadmission Neurontin -Add oxycodone, IV morphine, Flexeril and Lidoderm patches -In the past her primary neurosurgeon has recommended surgical intervention but she had declined in the past. Will ask him to reevaluate patient to determine if surgery indicated at this juncture noting there was moderate disc prominence and bilateral severe stenosis on most recent MRI -PT/OT evaluation  Left hand pain - dorsum of left hand is swollen, tender to touch with difficulty in making a fist. Xray unrevealing-no h/o trauma -  Voltaren gel-  Will discuss with hand surgery if any other measures needed.     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 ANP on 07/19/2015 at 12:55 PM  Between 7am to 7pm - Pager - [phone]  After 7pm go to [email or web address] - password [email or web address]  And look for the night coverage  person covering me after hours  [facility]  I have examined the patient, reviewed the chart, personally modified the above note and discussed the plan with Erin Hearing, NP. I agree with the above note.   Debbe Odea, MD  Addendum: Damaris Schooner with Caralyn Guile, MD about swelling in hand. Conservative management recommended as we are already doing.    Debbe Odea, MD Pager: [email or web address]

## 2015-07-20 DIAGNOSIS — R6510 Systemic inflammatory response syndrome (SIRS) of non-infectious origin without acute organ dysfunction: Principal | ICD-10-CM

## 2015-07-20 LAB — CBC
HCT: 35.9 % — ABNORMAL LOW (ref 36.0–46.0)
HEMOGLOBIN: 11.8 g/dL — AB (ref 12.0–15.0)
MCH: 29.9 pg (ref 26.0–34.0)
MCHC: 32.9 g/dL (ref 30.0–36.0)
MCV: 91.1 fL (ref 78.0–100.0)
Platelets: 235 10*3/uL (ref 150–400)
RBC: 3.94 MIL/uL (ref 3.87–5.11)
RDW: 13 % (ref 11.5–15.5)
WBC: 11.7 10*3/uL — ABNORMAL HIGH (ref 4.0–10.5)

## 2015-07-20 LAB — BASIC METABOLIC PANEL
ANION GAP: 6 (ref 5–15)
BUN: 9 mg/dL (ref 6–20)
CALCIUM: 8.5 mg/dL — AB (ref 8.9–10.3)
CHLORIDE: 108 mmol/L (ref 101–111)
CO2: 24 mmol/L (ref 22–32)
CREATININE: 0.58 mg/dL (ref 0.44–1.00)
GFR calc non Af Amer: >60 mL/min (ref >60)
Glucose, Bld: 82 mg/dL (ref 65–99)
Potassium: 4.4 mmol/L (ref 3.5–5.1)
SODIUM: 138 mmol/L (ref 135–145)

## 2015-07-20 MED ORDER — POLYETHYLENE GLYCOL 3350 17 G PO PACK
17.0000 g | PACK | Freq: Two times a day (BID) | ORAL | Status: DC
  Administered 2015-07-20 – 2015-07-21 (×3): 17 g via ORAL
  Filled 2015-07-20 (×3): qty 1

## 2015-07-20 MED ORDER — METHOCARBAMOL 1000 MG/10ML IJ SOLN
500.0000 mg | Freq: Four times a day (QID) | INTRAVENOUS | Status: DC | PRN
  Filled 2015-07-20 (×2): qty 5

## 2015-07-20 MED ORDER — FENTANYL 25 MCG/HR TD PT72
25.0000 ug | MEDICATED_PATCH | TRANSDERMAL | Status: DC
  Administered 2015-07-20: 25 ug via TRANSDERMAL
  Filled 2015-07-20: qty 1

## 2015-07-20 MED ORDER — METHOCARBAMOL 1000 MG/10ML IJ SOLN: 500.0000 mg | Freq: Four times a day (QID) | INTRAVENOUS | Status: DC | PRN

## 2015-07-20 MED ORDER — MORPHINE SULFATE 15 MG PO TABS
15.0000 mg | ORAL_TABLET | ORAL | Status: DC | PRN
  Administered 2015-07-20 – 2015-07-21 (×3): 15 mg via ORAL
  Filled 2015-07-20 (×3): qty 1

## 2015-07-20 MED ORDER — DIPHENHYDRAMINE HCL 25 MG PO CAPS
25.0000 mg | ORAL_CAPSULE | Freq: Four times a day (QID) | ORAL | Status: DC | PRN
  Filled 2015-07-20: qty 1

## 2015-07-20 NOTE — Evaluation (Signed)
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   Clinical Impression  Pt ambulation greatly limited by sever back pain and L ankle pain. Pt extremely resistant to AD because "it won't be conducive to my job". Pt unable to amb >50 feet without stopping for rest. Per past records surgery for back has been recommend but patient also refusing that as well. Acute PT to con't to follow to progress mobility as able.    Follow Up Recommendations No PT follow up;Supervision - Intermittent    Equipment Recommendations  Rolling walker with 5" wheels (however pt refussing)    Recommendations for Other Services       Precautions / Restrictions Precautions Precautions: Fall Precaution Comments: back pain Restrictions Weight Bearing Restrictions: No      Mobility  Bed Mobility Overal bed mobility: Modified Independent             General bed mobility comments: hob elevated, definite use of bed rails  Transfers Overall transfer level: Needs assistance Equipment used: None Transfers: Sit to/from Stand Sit to Stand: Min guard         General transfer comment: pt with increased back pain with anterior weight shift, definite use of UEs  Ambulation/Gait Ambulation/Gait assistance: Min guard Ambulation Distance (Feet): 150 Feet Assistive device: None;Straight cane Gait Pattern/deviations: Step-through pattern;Antalgic Gait velocity: decreased   General Gait Details: pt with L LE limp and onset of increased back pain. pt required 5 standing rest  breaks. attempted to use cane to off-weight L LE however pt unable to use on R side due to poor vision and con't reports "this will not work for my job"  Financial trader [employer] (Stroke Patients Only)       Balance Overall balance assessment: Needs assistance         Standing balance support: Single extremity supported Standing balance-Leahy Scale: Fair Standing balance comment: due to back pain pt with limited standing tolerance                             Pertinent Vitals/Pain Pain Assessment: 0-10 Pain Score: 10-Worst pain ever Pain Location: back with walking, L foot at achilles tendon and big toe with walking Pain Descriptors / Indicators: Sharp;Shooting Pain Intervention(s): Limited activity within patient's tolerance;Monitored during session    [place] expects to be discharged to:: Private residence Living Arrangements: Parent (73 yo mother) Available Help at Discharge: Family;Available 24 hours/day (pt cares for mother) Type of Home: House Home Access: Stairs to enter Entrance Stairs-Rails: Can reach both Entrance Stairs-Number of Steps: 3 Home Layout: One level Home Equipment: None      Prior Function Level of Independence: Independent         Comments: works as a MD for [employer]   Dominant Hand: Right    Extremity/Trunk Assessment   Upper Extremity Assessment: Overall WFL for tasks assessed  Lower Extremity Assessment: LLE deficits/detail (MMT limited by onset of back pain)   LLE Deficits / Details: point tenderness over achilles tendon  Cervical / Trunk Assessment:  (severe back pain)  Communication   Communication: No difficulties  Cognition Arousal/Alertness: Awake/alert Behavior During Therapy: WFL for tasks assessed/performed Overall Cognitive Status: Within Functional Limits for tasks assessed                      General  Comments General comments (skin integrity, edema, etc.): pt very frustrated with the constant pain and frequent medical issues    Exercises        Assessment/Plan    PT Assessment Patient needs continued PT services  PT Diagnosis Difficulty walking;Acute pain   PT Problem List Decreased strength;Decreased balance;Decreased mobility;Pain;Decreased activity tolerance  PT Treatment Interventions DME instruction;Gait training;Functional mobility training;Therapeutic activities;Therapeutic exercise;Balance training   PT Goals (Current goals can be found in the Care Plan section) Acute Rehab PT Goals Patient Stated Goal: stop being in pain PT Goal Formulation: With patient Time For Goal Achievement: 07/27/15 Potential to Achieve Goals: Fair    Frequency Min 2X/week   Barriers to discharge Decreased caregiver support cares for 51 yo mother    Co-evaluation               End of Session Equipment Utilized During Treatment: Gait belt Activity Tolerance: Patient limited by pain Patient left: in chair;with call bell/phone within reach Nurse Communication: Mobility status;Patient requests pain meds    Functional Assessment Tool Used: clinical judgement Functional Limitation: Mobility: Walking and moving around Mobility: Walking and Moving Around Current Status [phone]): At least 20 percent but less than 40 percent impaired, limited or restricted Mobility: Walking and Moving Around Goal Status [phone]): At least 1 percent but less than 20 percent impaired, limited or restricted    Time: 1220-1258 PT Time Calculation (min) (ACUTE ONLY): 38 min   Charges:   PT Evaluation $Initial PT Evaluation Tier I: 1 Procedure PT Treatments $Gait Training: 8-22 mins $Therapeutic Activity: 8-22 mins   PT G Codes:   PT G-Codes **NOT FOR INPATIENT CLASS** Functional Assessment Tool Used: clinical judgement Functional Limitation: Mobility: Walking and moving around Mobility: Walking and  Moving Around Current Status ([id number]): At least 20 percent but less than 40 percent impaired, limited or restricted Mobility: Walking and Moving Around Goal Status [phone]): At least 1 percent but less than 20 percent impaired, limited or restricted    Kingsley Callander 07/20/2015, 2:21 PM   Kittie Plater, PT, DPT Pager #: [phone] Office #: [phone]

## 2015-07-20 NOTE — Progress Notes (Signed)
Report called pt to transfer to 5M20 via w/c with belongings. Stated that pain is now better control, will hold off on robaxin per pt.

## 2015-07-20 NOTE — Progress Notes (Signed)
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 She used her typical combination of pharmacological / nonpharmacological management without any improvement in symptoms. She then awakened the day prior to her admission with chills rigors and nausea with worsening back pain. She later became quite nauseous and while bending over the toilet she felt a sharp slashing's type pain in her low back. She then fell backwards onto the bathroom floor without injuring herself.   In the ER her initial temperature was 101.1.  She was given Tylenol and defervesced but then her systolic blood pressure dropped into the low 90s and she was subsequently given a total of 3 L normal saline fluid bolus. Her white count was found to be elevated at 19,600.  MRI of the lumbar spine revealed L4-5 moderate central/left paracentral disc protrusion and severe bilateral lateral recess stenosis. EDP discussed these results with the patient's primary neurosurgeon Dr. Vertell Limber with no new recommendations made. Patient had a focal area of point tenderness over the left lateral occiput but this was not associated with meningeal signs.  HPI/Subjective: The patient is up pacing her room in severe pain.  She reports no significant improvement in her low back pain despite the current pain medication regimen being afforded her.  She denies any further abdominal pain.  She denies chest pain or shortness of breath.  She is alert oriented and intact neurologically.  Assessment/Plan:  SIRS    -No definitive source of infection despite significant leukocytosis -WBC rapidly approaching normal  -influenza PCR negative -No indication for antibiotics at this juncture  -?food poisoning ([place] hours before her GI sx began)  Acute exacerbation of chronic low back pain -neurologically intact - no acute surgical issues on MRI  -continue preadmission Neurontin -adjust tx regimen and follow - pt states pain has not improved w/ trials of steroid in past  -may require surgical correction to accomplish lasting pain control  -PT/OT evaluation  Left hand pain -dorsum of left hand was swollen, tender to touch with difficulty in making a fist -Xray unrevealing - no h/o trauma - likely due to aggressive volume expansion  -sx much improved today   Seronegative myasthenia gravis  -Previously was bulbar related and improved after combination of surgery and intermittent use of low-dose Mestinon -Followed as an outpatient by Neurology  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 rate 16, height '5\' 4"'  (1.626 m), weight 69.174 kg (152 lb 8 oz), SpO2 98 %.  Intake/Output Summary (Last 24 hours) at 07/20/15 1320 Last data filed at 07/20/15 0800  Gross per 24 hour  Intake   4180 ml  Output  0 ml  Net   4180 ml   Exam: General: No acute respiratory distress Lungs: Clear to auscultation bilaterally without wheezes or crackles Cardiovascular: Regular rate and rhythm without murmur gallop or rub  normal S1 and S2 Abdomen: Nontender, nondistended, soft, bowel sounds positive, no rebound, no ascites, no appreciable mass Extremities: No significant cyanosis, clubbing, or edema bilateral lower extremities  Data Reviewed: Basic Metabolic Panel:  Recent Labs Lab 07/19/15 0734 07/20/15 0330  NA 137 138  K 3.7 4.4  CL 99* 108  CO2 27 24  GLUCOSE 121* 82  BUN 16 9  CREATININE 0.68 0.58  CALCIUM 9.3 8.5*    CBC:  Recent Labs Lab 07/19/15 0734 07/20/15 0330  WBC 19.6* 11.7*  NEUTROABS 17.5*  --   HGB 13.7 11.8*  HCT 40.1 35.9*  MCV 89.1 91.1  PLT 259 235    Liver Function Tests:  Recent Labs Lab 07/19/15 0734  AST 35  ALT 27  ALKPHOS 62  BILITOT 0.8  PROT 7.2  ALBUMIN 4.0   Cardiac Enzymes:  Recent Labs Lab 07/19/15 0734  CKTOTAL 45    Recent Results (from the past 240 hour(s))  Blood culture (routine x 2)     Status: None (Preliminary result)   Collection Time: 07/19/15  8:25 AM  Result Value Ref Range Status   Specimen Description BLOOD LEFT FOREARM  Final   Special Requests BOTTLES DRAWN AEROBIC AND ANAEROBIC 5CC  Final   Culture NO GROWTH 1 DAY  Final   Report Status PENDING  Incomplete  Blood culture (routine x 2)     Status: None (Preliminary result)   Collection Time: 07/19/15  8:33 AM  Result Value Ref Range Status   Specimen Description BLOOD RIGHT HAND  Final   Special Requests BOTTLES DRAWN AEROBIC AND ANAEROBIC 5CC  Final   Culture NO GROWTH 1 DAY  Final   Report Status PENDING  Incomplete  MRSA PCR Screening     Status: None   Collection Time: 07/19/15  3:05 PM  Result Value Ref Range Status   MRSA by PCR NEGATIVE NEGATIVE Final    Comment:        The GeneXpert MRSA Assay (FDA approved for NASAL specimens only), is one component of a comprehensive MRSA colonization surveillance program. It is not intended to diagnose MRSA infection nor to guide or monitor treatment for MRSA infections.      Studies:   Recent x-ray  studies have been reviewed in detail by the Attending Physician  Scheduled Meds:  Scheduled Meds: . aspirin EC  81 mg Oral Daily  . cholecalciferol  2,000 Units Oral Daily  . cycloSPORINE  1 drop Both Eyes BID  . diclofenac sodium  2 g Topical QID  . docusate sodium  100 mg Oral BID  . doxycycline  100 mg Oral Daily  . enoxaparin (LOVENOX) injection  40 mg Subcutaneous Q24H  . gabapentin  300 mg Oral Daily  . gabapentin  600 mg Oral QHS  . lidocaine  1 patch Transdermal Q24H  . Lifitegrast  1 drop Ophthalmic BID  . omega-3 acid ethyl esters  1 g Oral Daily  . sodium chloride  3 mL Intravenous Q12H    Time spent on care of this patient: 35 mins   MCCLUNG,JEFFREY T , MD   [facility] Office  [phone] Pager - Text Page per Shea Evans as per below:  On-Call/Text Page:      [email or web address]      password  [email or web address]  If 7PM-7AM, please contact night-coverage [email or web address] Password [email or web address] 07/20/2015, 1:20 PM   LOS: 1 day

## 2015-07-20 NOTE — Progress Notes (Signed)
Pt refuses CPAP for the night.  

## 2015-07-20 NOTE — Progress Notes (Addendum)
Recd patient to room via [place]. Patient states pain much better. Sitting up in chair, call bell in reach, accompanied by family. Patient stating she would like some ice water, but denies other needs at this time.

## 2015-07-21 DIAGNOSIS — M545 Low back pain: Secondary | ICD-10-CM | POA: Diagnosis not present

## 2015-07-21 DIAGNOSIS — D72829 Elevated white blood cell count, unspecified: Secondary | ICD-10-CM | POA: Diagnosis not present

## 2015-07-21 DIAGNOSIS — Z9013 Acquired absence of bilateral breasts and nipples: Secondary | ICD-10-CM | POA: Diagnosis not present

## 2015-07-21 DIAGNOSIS — R6510 Systemic inflammatory response syndrome (SIRS) of non-infectious origin without acute organ dysfunction: Secondary | ICD-10-CM | POA: Diagnosis not present

## 2015-07-21 LAB — CBC
HEMATOCRIT: 33.1 % — AB (ref 36.0–46.0)
Hemoglobin: 11 g/dL — ABNORMAL LOW (ref 12.0–15.0)
MCH: 30.3 pg (ref 26.0–34.0)
MCHC: 33.2 g/dL (ref 30.0–36.0)
MCV: 91.2 fL (ref 78.0–100.0)
PLATELETS: 219 10*3/uL (ref 150–400)
RBC: 3.63 MIL/uL — AB (ref 3.87–5.11)
RDW: 13 % (ref 11.5–15.5)
WBC: 5.6 10*3/uL (ref 4.0–10.5)

## 2015-07-21 MED ORDER — METHOCARBAMOL 500 MG PO TABS: 500.0000 mg | ORAL_TABLET | Freq: Four times a day (QID) | ORAL | Status: DC | PRN

## 2015-07-21 MED ORDER — FENTANYL 25 MCG/HR TD PT72: 25.0000 ug | MEDICATED_PATCH | TRANSDERMAL | Status: DC

## 2015-07-21 MED ORDER — ACETAMINOPHEN 500 MG PO TABS: 1000.0000 mg | ORAL_TABLET | Freq: Three times a day (TID) | ORAL | Status: DC

## 2015-07-21 MED ORDER — DICLOFENAC SODIUM 1 % TD GEL: 2.0000 g | Freq: Four times a day (QID) | TRANSDERMAL | Status: DC

## 2015-07-21 NOTE — Progress Notes (Signed)
D/C orders received, pt for D/C home today,  IV D/C'd.  Rx and D/C instructions given with verbalized understanding.  Family at bedside to assist with D/C.  Staff brought pt downstairs via wheelchair.

## 2015-07-21 NOTE — Discharge Summary (Signed)
Physician Discharge Summary  Felicia Cain HWE:[id number] DOB: 22-Oct-1955 DOA: 07/19/2015  PCP: [name], MD  Admit date: 07/19/2015 Discharge date: 07/21/2015  Time spent: 35 min minutes  Recommendations for Outpatient Follow-up:  1. Please follow up on back pain 2. Repeat BMP and CBC on hospital follow up visit.    Discharge Diagnoses:  Principal Problem:   SIRS (systemic inflammatory response syndrome) (HCC) Active Problems:   DCIS (ductal carcinoma in situ) of breast   S/P bilateral mastectomy   BRCA negative   S/P laminectomy   RSD lower limb   OSA on CPAP   Seronegative myasthenia gravis ([place])   Acute exacerbation of chronic low back pain   Leukocytosis   Discharge Condition: Stable/improved  Diet recommendation: Heart healthy diet  Filed Weights   07/20/15 0342  Weight: 69.174 kg (152 lb 8 oz)    History of present illness:  This is a 59 year old female patient with past medical history of DCIS (which was BRCA negative) status post bilateral mastectomy, seronegative myasthenia gravis, chronic low back pain with associated RSD of the legs, GERD, history of optic neuritis, sleep apnea on CPAP, and issues with persistent daytime sleepiness. Patient presented to the ER with complaints of severe back pain and fever. She reports that Wednesday she began having an exacerbation of her low back pain and used her typical combination of pharmacological nonpharmacological management without any improvement in symptoms. She was unable to drive so attended a local convention with the assistance of a family member who drove her back and forth. She was able to sit through the convention without any significant worsening of her symptoms. As of yesterday she came back home, ate dinner as usual and decided to go to bed. She was awakened with chills rigors and nausea and worsening back pain. She took her temperature and found her temperature to be over 101. She subsequently  took Tylenol. Unfortunately her symptoms worsened and she was unable to sleep. About 4 AM she can't quite nauseous and felt like she needed to go have emesis. While attempting to bend over the toilet she felt a sharp slashing's type pain in her low back. She was able to vomit but then fell backwards onto the bathroom floor without injuring herself. To keep her back from hurting she had kept her hips flexed and her needs towards her chest. She was eventually able to crawl to her bedroom with the carpet was an rest until the spasm in her back improved enough where she could stand. She noticed that when her back pain was at the worst her left toe was crawling. Typically when she has exacerbations of back pain she has pharmacological measures prescribed by her neurosurgeon and/or neurologist. Her last treatment for acupuncture was 3 weeks prior. She's not had any dysuria, cough or rhinorrhea. She reports she took an influenza vaccine this year.  In the ER her initial temperature was 101.1, BP was 115/70, pulse 106, respirations 15 and room air sats 97%. She was given Tylenol and defervesced but then her systolic blood pressure dropped into the low 90s and she was subsequently given a total of 3 L normal saline fluid bolus. Her white count was found to be elevated at 19,600 and given her back pain there were concerned she may have discitis or other infectious process as etiology to the back pain so an MRI of the lumbar spine is completed which revealed L4-5 moderate central/left paracentral disc protrusion and severe bilateral lateral recess stenosis. EDP  discussed these results with the patient's primary neurosurgeon Dr. Vertell Limber with no new recommendations made. Patient had a focal area of point tenderness over the left lateral occiput but this was not associated with meningeal signs. In addition to fluid challenges patient was given morphine and Zofran in the ER with some relief of her back pain although she still  continued with a positive straight leg raise test. Her lactic acid was normal at 0.81, in addition to leukocytosis she had elevated absolute neutrophils 17,500 with low monocytes at 1.2. She denies recent use of steroids or steroid injection. I panel was unremarkable except for mild hyperglycemia. Urinalysis was negative for infection. Because of fever and low blood pressure blood cultures were obtained. Chest x-ray was unremarkable for edema or infectious process. Patient's blood pressure remains somewhat suboptimal despite receiving 3 L of fluid with current blood pressure 99/60 with patient reports baseline blood pressures between 400 and 867 systolic.  Hospital Course:  Patient is a pleasant 59 year old female with a past medical history of breast cancer, chronic lower back pain, status post L4-5 laminectomy who presented to the emergency department on 07/19/2015 with complaints of worsening back pain. Reported also having multiple episodes of nausea and vomiting prior to this hospitalization. She was found to be febrile with a temperature 101.1 and hypotensive. Labs revealed a white count of 19,600. Those concerns that the source of infection could be from her back for which further workup was pursued. MRI of lumbar spine did not reveal evidence of discitis. Radiology reported at the L4-5 level there was moderate disc protrusion with severe bilateral lateral recess stenosis. At the L5-S1 level there was a prior discectomy at appeared stable. Blood pressures improving with IV fluid resuscitation. Her labs were followed with a downward trend in her white count to 5600 by 07/21/2015. I suspect that fever and leukocytosis may have been related to food poisoning. She reported having chicken at the convention center prior to symptoms. With regard to exacerbation of chronic low back pain she reported improvement with 25 mcg patch of fentanyl. By 07/21/2015 she reported feeling much better, was ambulating around her  room. She was tolerating by mouth intake, remained afebrile, hemodynamically stable. She was discharged home on this date in stable condition.   Accuchecks 4 times/day, Once in AM empty stomach and then before each meal. Log in all results and show them to your Prim.MD in 3 days. If any glucose reading is under 80 or above 300 call your Prim MD immidiately. Follow Low glucose instructions for glucose under 80 as instructed.  For Heart failure patients - Check your Weight same time everyday, if you gain over 2 pounds, or you develop in leg swelling, experience more shortness of breath or chest pain, call your Primary MD immediately. Follow Cardiac Low Salt Diet and 1.5 lit/day fluid restriction.   On your next visit with your primary care physician please Get Medicines reviewed and adjusted.   Please request your Prim.MD to go over all Hospital Tests and Procedure/Radiological results at the follow up, please get all Hospital records sent to your Prim MD by signing hospital release before you go home.   If you experience worsening of your admission symptoms, develop shortness of breath, life threatening emergency, suicidal or homicidal thoughts you must seek medical attention immediately by calling 911 or calling your MD immediately if symptoms less severe.  You Must read complete instructions/literature along with all the possible adverse reactions/side effects for all the Medicines you  take and that have been prescribed to you. Take any new Medicines after you have completely understood and accpet all the possible adverse reactions/side effects.   Do not drive, operating heavy machinery, perform activities at heights, swimming or participation in water activities or provide baby sitting services if your were admitted for syncope or siezures until you have seen by Primary MD or a Neurologist and advised to do so again.  Do not drive when taking Pain medications.    Do not take more than  prescribed Pain, Sleep and Anxiety Medications  Special Instructions: If you have smoked or chewed Tobacco in the last 2 yrs please stop smoking, stop any regular Alcohol and or any Recreational drug use.  Wear Seat belts while driving.   Please note  You were cared for by a hospitalist during your hospital stay. If you have any questions about your discharge medications or the care you received while you were in the hospital after you are discharged, you can call the unit and asked to speak with the hospitalist on call if the hospitalist that took care of you is not available. Once you are discharged, your primary care physician will handle any further medical issues. Please note that NO REFILLS for any discharge medications will be authorized once you are discharged, as it is imperative that you return to your primary care physician (or establish a relationship with a primary care physician if you do not have one) for your aftercare needs so that they can reassess your need for medications and monitor your lab values.  Discharge Exam: Filed Vitals:   07/21/15 0521  BP: 106/52  Pulse: 67  Temp: 97.7 F (36.5 C)  Resp: 18    General: No acute respiratory distress, she is up ambulating around her room Lungs: Clear to auscultation bilaterally without wheezes or crackles Cardiovascular: Regular rate and rhythm without murmur gallop or rub normal S1 and S2 Abdomen: Nontender, nondistended, soft, bowel sounds positive, no rebound, no ascites, no appreciable mass Extremities: No significant cyanosis, clubbing, or edema bilateral lower extremities  Discharge Instructions   Discharge Instructions    Call MD for:  difficulty breathing, headache or visual disturbances    Complete by:  As directed      Call MD for:  extreme fatigue    Complete by:  As directed      Call MD for:  hives    Complete by:  As directed      Call MD for:  persistant dizziness or light-headedness    Complete by:   As directed      Call MD for:  persistant nausea and vomiting    Complete by:  As directed      Call MD for:  redness, tenderness, or signs of infection (pain, swelling, redness, odor or green/yellow discharge around incision site)    Complete by:  As directed      Call MD for:  severe uncontrolled pain    Complete by:  As directed      Call MD for:  temperature >100.4    Complete by:  As directed      Call MD for:    Complete by:  As directed      Diet - low sodium heart healthy    Complete by:  As directed      Increase activity slowly    Complete by:  As directed           Discharge Medication List as  of 07/21/2015  9:40 AM    START taking these medications   Details  acetaminophen (TYLENOL) 500 MG tablet Take 2 tablets (1,000 mg total) by mouth 3 (three) times daily., Starting 07/21/2015, Until Discontinued, Normal    diclofenac sodium (VOLTAREN) 1 % GEL Apply 2 g topically 4 (four) times daily., Starting 07/21/2015, Until Discontinued, Print    fentaNYL (DURAGESIC - DOSED MCG/HR) 25 MCG/HR patch Place 1 patch (25 mcg total) onto the skin every 3 (three) days., Starting 07/21/2015, Until Discontinued, Print      CONTINUE these medications which have NOT CHANGED   Details  aspirin 81 MG tablet Take 81 mg by mouth daily.  , Until Discontinued, Historical Med    cholecalciferol (VITAMIN D) 1000 UNITS tablet Take 2,000 Units by mouth daily., Until Discontinued, Historical Med    cycloSPORINE (RESTASIS) 0.05 % ophthalmic emulsion Place 1 drop into both eyes 2 (two) times daily., Until Discontinued, Historical Med    doxycycline (VIBRAMYCIN) 100 MG capsule Take 100 mg by mouth daily. , Starting 05/16/2012, Until Discontinued, Historical Med    gabapentin (NEURONTIN) 300 MG capsule Take by mouth. 318m po am and 6058mpo pm, Until Discontinued, Historical Med    Lifitegrast (XIIDRA) 5 % SOLN Apply 1 drop to eye 2 (two) times daily., Until Discontinued, Historical Med     Multiple Vitamins-Minerals (PRESERVISION AREDS 2 PO) Take 1 tablet by mouth 2 (two) times daily., Until Discontinued, Historical Med    Omega-3 Fatty Acids (FISH OIL) 1200 MG CAPS Take 1,200 mg by mouth daily., Until Discontinued, Historical Med       Allergies  Allergen Reactions  . Latex Itching and Swelling  . Sulfa Antibiotics Swelling    Redness, topical  . Tape      Blistering on site, can only use paper tape or surgical.  . Adderall [Amphetamine-Dextroamphetamine] Other (See Comments)    headache   Follow-up Information    Follow up with [facility] In 1 week.   Specialty:  Anesthesiology   Contact information:   [id number] [place] [phone]      The results of significant diagnostics from this hospitalization (including imaging, microbiology, ancillary and laboratory) are listed below for reference.    Significant Diagnostic Studies: Dg Chest 2 View  07/19/2015  CLINICAL DATA:  Chronic back pain and weakness ; fever EXAM: CHEST  2 VIEW COMPARISON:  Chest CT October 07, 2012; chest radiograph December 29, 2010 FINDINGS: The lungs are clear. Heart size and pulmonary vascularity are normal. No adenopathy. There are breast implants bilaterally. No bone lesions. IMPRESSION: Breast implants bilaterally.  No edema or consolidation. Electronically Signed   By: WiLowella GripII M.D.   On: 07/19/2015 08:21   Dg Lumbar Spine Complete  07/19/2015  CLINICAL DATA:  Chronic low back pain which has worsened over the past year with an acute increase yesterday. No known injury. Initial encounter. EXAM: LUMBAR SPINE - COMPLETE 4+ VIEW COMPARISON:  Plain films lumbar spine from an outside facility 03/13/2015. MRI lumbar spine from an outside facility 05/04/2014. FINDINGS: Vertebral body height and alignment are maintained. Loss of disc space height and endplate sclerosis at L5J8-S5ppear unchanged. No pars interarticularis defect is identified.  IMPRESSION: No acute abnormality. Degenerative disc disease L5-S1. Electronically Signed   By: ThInge Rise.D.   On: 07/19/2015 08:22   Mr Lumbar Spine W Wo Contrast  07/19/2015  CLINICAL DATA:  Chronic back pain worsening over  the last year. History of back surgery. EXAM: MRI LUMBAR SPINE WITHOUT AND WITH CONTRAST TECHNIQUE: Multiplanar and multiecho pulse sequences of the lumbar spine were obtained without and with intravenous contrast. CONTRAST:  4m MULTIHANCE GADOBENATE DIMEGLUMINE 529 MG/ML IV SOLN COMPARISON:  05/04/2014 FINDINGS: The vertebral bodies of the lumbar spine are normal in size. The vertebral bodies of the lumbar spine are normal in alignment. There is normal bone marrow signal demonstrated throughout the vertebra. There is severe degenerative disc disease with disc height loss at L5-S1 with Modic endplate changes. There is disc desiccation at L4-5. There is disc height loss at T11-12. The disc heights are otherwise maintained. The spinal cord is normal in signal and contour. The cord terminates normally at T12-L1 . The nerve roots of the cauda equina and the filum terminale are normal. The visualized portions of the SI joints are unremarkable. The imaged intra-abdominal contents are unremarkable. T11-12: Mild broad-based disc bulge. No foraminal or central canal stenosis. T12-L1: No significant disc bulge. No evidence of neural foraminal stenosis. No central canal stenosis. L1-L2: No significant disc bulge. No evidence of neural foraminal stenosis. No central canal stenosis. L2-L3: No significant disc bulge. No evidence of neural foraminal stenosis. No central canal stenosis. Mild bilateral facet arthropathy. L3-L4: No significant disc bulge. No evidence of neural foraminal stenosis. No central canal stenosis. L4-L5: Moderate central/ left paracentral disc protrusion with severe bilateral lateral recess stenosis. No evidence of neural foraminal stenosis. Mild bilateral facet  arthropathy. Mild spinal stenosis. L5-S1: Prior discectomy and right laminotomy defect. Mild broad-based disc osteophyte complex. Mild bilateral foraminal stenosis. Mild bilateral facet arthropathy. No central canal stenosis. IMPRESSION: 1. At L4-5 there is a moderate central/ left paracentral disc protrusion with severe bilateral lateral recess stenosis. Mild bilateral facet arthropathy. Multifactorial mild spinal stenosis. 2. At L5-S1 there is prior discectomy and right laminotomy defect. Mild broad-based disc osteophyte complex. Mild bilateral foraminal stenosis. Mild bilateral facet arthropathy. Electronically Signed   By: HKathreen Devoid  On: 07/19/2015 09:59   Dg Hand Complete Left  07/19/2015  CLINICAL DATA:  Fall while walking a restroom with hand pain, initial encounter EXAM: LEFT HAND - COMPLETE 3+ VIEW COMPARISON:  01/29/2012 FINDINGS: There is no evidence of fracture or dislocation. There is no evidence of arthropathy or other focal bone abnormality. Soft tissues are unremarkable. IMPRESSION: No acute abnormality noted. Electronically Signed   By: MInez CatalinaM.D.   On: 07/19/2015 08:19   Dg Foot Complete Left  07/19/2015  CLINICAL DATA:  Fall today while walking to restroom with foot pain, initial encounter EXAM: LEFT FOOT - COMPLETE 3+ VIEW COMPARISON:  06/08/2012 FINDINGS: Stable calcifications are noted adjacent to the fifth distal phalanx. These are likely related to prior trauma. No acute fracture or dislocation is noted. No gross soft tissue abnormality is seen. IMPRESSION: No acute abnormality noted. Electronically Signed   By: MInez CatalinaM.D.   On: 07/19/2015 08:23    Microbiology: Recent Results (from the past 240 hour(s))  Blood culture (routine x 2)     Status: None (Preliminary result)   Collection Time: 07/19/15  8:25 AM  Result Value Ref Range Status   Specimen Description BLOOD LEFT FOREARM  Final   Special Requests BOTTLES DRAWN AEROBIC AND ANAEROBIC 5CC  Final    Culture NO GROWTH 2 DAYS  Final   Report Status PENDING  Incomplete  Blood culture (routine x 2)     Status: None (Preliminary result)   Collection  Time: 07/19/15  8:33 AM  Result Value Ref Range Status   Specimen Description BLOOD RIGHT HAND  Final   Special Requests BOTTLES DRAWN AEROBIC AND ANAEROBIC 5CC  Final   Culture NO GROWTH 2 DAYS  Final   Report Status PENDING  Incomplete  MRSA PCR Screening     Status: None   Collection Time: 07/19/15  3:05 PM  Result Value Ref Range Status   MRSA by PCR NEGATIVE NEGATIVE Final    Comment:        The GeneXpert MRSA Assay (FDA approved for NASAL specimens only), is one component of a comprehensive MRSA colonization surveillance program. It is not intended to diagnose MRSA infection nor to guide or monitor treatment for MRSA infections.      Labs: Basic Metabolic Panel:  Recent Labs Lab 07/19/15 0734 07/20/15 0330  NA 137 138  K 3.7 4.4  CL 99* 108  CO2 27 24  GLUCOSE 121* 82  BUN 16 9  CREATININE 0.68 0.58  CALCIUM 9.3 8.5*   Liver Function Tests:  Recent Labs Lab 07/19/15 0734  AST 35  ALT 27  ALKPHOS 62  BILITOT 0.8  PROT 7.2  ALBUMIN 4.0   No results for input(s): LIPASE, AMYLASE in the last 168 hours. No results for input(s): AMMONIA in the last 168 hours. CBC:  Recent Labs Lab 07/19/15 0734 07/20/15 0330 07/21/15 0720  WBC 19.6* 11.7* 5.6  NEUTROABS 17.5*  --   --   HGB 13.7 11.8* 11.0*  HCT 40.1 35.9* 33.1*  MCV 89.1 91.1 91.2  PLT 259 235 219   Cardiac Enzymes:  Recent Labs Lab 07/19/15 0734  CKTOTAL 45   BNP: BNP (last 3 results) No results for input(s): BNP in the last 8760 hours.  ProBNP (last 3 results) No results for input(s): PROBNP in the last 8760 hours.  CBG: No results for input(s): GLUCAP in the last 168 hours.     SignedKelvin Cellar  [facility] 07/21/2015, 4:05 PM

## 2015-07-24 LAB — CULTURE, BLOOD (ROUTINE X 2)
CULTURE: NO GROWTH
Culture: NO GROWTH

## 2015-08-08 ENCOUNTER — Institutional Professional Consult (permissible substitution): Payer: Managed Care, Other (non HMO) | Admitting: Internal Medicine

## 2015-08-09 ENCOUNTER — Telehealth: Payer: Self-pay | Admitting: Neurology

## 2015-08-12 NOTE — Telephone Encounter (Signed)
Rx signed and faxed.

## 2015-08-14 NOTE — Telephone Encounter (Signed)
Pt called and needs prior auth on this medication. Thank you

## 2015-08-14 NOTE — Telephone Encounter (Signed)
Ins has been contacted and provided with clinical info.  Request is under review Ref key # [id number]

## 2015-08-15 ENCOUNTER — Institutional Professional Consult (permissible substitution): Payer: Managed Care, Other (non HMO) | Admitting: Internal Medicine

## 2015-08-22 NOTE — Telephone Encounter (Signed)
[facility]) has approved the request for coverage on Modafinil effective until 08/13/2016 Ref # [id number]  Ref ID# [id number]

## 2015-12-13 ENCOUNTER — Encounter: Payer: Self-pay | Admitting: Sports Medicine

## 2015-12-13 ENCOUNTER — Ambulatory Visit (INDEPENDENT_AMBULATORY_CARE_PROVIDER_SITE_OTHER): Payer: Managed Care, Other (non HMO) | Admitting: Sports Medicine

## 2015-12-13 VITALS — BP 124/73 | HR 87 | Ht 66.0 in | Wt 145.0 lb

## 2015-12-13 DIAGNOSIS — M25511 Pain in right shoulder: Secondary | ICD-10-CM | POA: Diagnosis not present

## 2015-12-13 MED ORDER — METHYLPREDNISOLONE ACETATE 40 MG/ML IJ SUSP
40.0000 mg | Freq: Once | INTRAMUSCULAR | Status: AC
  Administered 2015-12-13: 40 mg via INTRA_ARTICULAR

## 2015-12-13 MED ORDER — CELECOXIB 200 MG PO CAPS: 200.0000 mg | ORAL_CAPSULE | Freq: Every day | ORAL | Status: DC
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
## 2016-01-21 ENCOUNTER — Telehealth: Payer: Self-pay | Admitting: Neurology

## 2016-01-21 NOTE — Telephone Encounter (Signed)
Felicia Cain [id number]  [name]  [phone] * [birth year]  WANTS TO KNOW IF THE DOCTOR HAS ANY  CANCELLATIONS FOR THIS WEEK    I called pt and made appt for 01/22/16 at 9:30am

## 2016-01-22 ENCOUNTER — Ambulatory Visit (INDEPENDENT_AMBULATORY_CARE_PROVIDER_SITE_OTHER): Payer: Managed Care, Other (non HMO) | Admitting: Neurology

## 2016-01-22 ENCOUNTER — Telehealth: Payer: Self-pay

## 2016-01-22 ENCOUNTER — Encounter: Payer: Self-pay | Admitting: Neurology

## 2016-01-22 ENCOUNTER — Other Ambulatory Visit: Payer: Self-pay

## 2016-01-22 VITALS — BP 100/68 | HR 80 | Resp 20 | Ht 66.0 in | Wt 147.0 lb

## 2016-01-22 DIAGNOSIS — F329 Major depressive disorder, single episode, unspecified: Secondary | ICD-10-CM

## 2016-01-22 DIAGNOSIS — G4710 Hypersomnia, unspecified: Secondary | ICD-10-CM

## 2016-01-22 DIAGNOSIS — G4733 Obstructive sleep apnea (adult) (pediatric): Secondary | ICD-10-CM

## 2016-01-22 DIAGNOSIS — G7000 Myasthenia gravis without (acute) exacerbation: Secondary | ICD-10-CM

## 2016-01-22 DIAGNOSIS — F32A Depression, unspecified: Secondary | ICD-10-CM

## 2016-01-22 DIAGNOSIS — G4730 Sleep apnea, unspecified: Secondary | ICD-10-CM

## 2016-01-22 MED ORDER — METHYLPHENIDATE HCL 10 MG PO TABS: 10.0000 mg | ORAL_TABLET | Freq: Two times a day (BID) | ORAL | Status: DC

## 2016-01-22 MED ORDER — VENLAFAXINE HCL ER 75 MG PO CP24: ORAL_CAPSULE | ORAL | Status: DC

## 2016-01-22 MED ORDER — VENLAFAXINE HCL 75 MG PO TABS: ORAL_TABLET | ORAL | Status: DC

## 2016-01-22 NOTE — Telephone Encounter (Signed)
Pt came back to clinic after her appt and requested that Dr. Brett Fairy be made aware that she wants an "in-lab study because the home test did not work." I checked and cannot find an order for a sleep study.   Please review and order the appropriate sleep study for this pt.

## 2016-01-22 NOTE — Telephone Encounter (Signed)
[facility] sent a fax saying that the effexor Dr. Brett Fairy prescribed was IR but the previous RX was for XR. They want to know if the XR should be ordered. Dr. Brett Fairy wrote on the fax to change it to XR, but it needs to be updated in our system for future refills.
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
## 2016-02-26 ENCOUNTER — Encounter: Payer: Self-pay | Admitting: Sports Medicine

## 2016-02-26 ENCOUNTER — Ambulatory Visit (INDEPENDENT_AMBULATORY_CARE_PROVIDER_SITE_OTHER): Payer: Managed Care, Other (non HMO) | Admitting: Sports Medicine

## 2016-02-26 VITALS — BP 100/78 | Ht 66.0 in | Wt 145.0 lb

## 2016-02-26 DIAGNOSIS — M25511 Pain in right shoulder: Secondary | ICD-10-CM | POA: Diagnosis not present

## 2016-02-26 NOTE — Progress Notes (Signed)
   Subjective:    Patient ID: Felicia Cain, female    DOB: [birth year], 60 y.o.   MRN: [id number]  HPI  chief complaint: Right shoulder pain  Patient comes in today with persistent right shoulder pain. She was last seen in the office on 12/13/2015. Subacromial cortisone injection was administered which did help some with her pain but it has not resulted in complete symptom resolution. She still gets frequent pain, popping, and catching in the shoulder. It is worse with activity such as reaching out away from her body or reaching overhead. She is also getting nighttime pain. She has tried Voltaren gel, Biofreeze, and Celebrex. Nothing seems to really help. Symptoms began back in the spring after she was doing quite a bit of lifting and pushing while moving out of her house. She denies any numbness or tingling.    Review of Systems     Objective:   Physical Exam Well-developed, well-nourished. No acute distress  Right shoulder: Full range of motion with a positive painful arc. No tenderness to palpation along the clavicle or over the acromioclavicular joint. Positive empty can. Resisted supraspinatus is 5/5 but reproducible of pain. 5 out of 5 strength with resisted external rotation and internal rotation. Markedly positive O'Brien's. Positive clunk. Negative Spurling's. Neurovascularly intact distally.       Assessment & Plan:   Persistent right shoulder pain or a simple rotator cuff tear versus labral tear  Patient has failed to improve with conservative treatment which has included multiple NSAIDs and a recent subacromial cortisone injection. I'm concerned that she may have either a significant rotator cuff tear or possibly a labral tear which is causing her mechanical symptoms and pain. I'm going to start with getting an x-ray of her shoulder and then will proceed with an MRI scan specifically to rule out a rotator cuff tear or large labral tear. Patient will follow-up with me after  those studies to discuss the results and delineate further treatment. We did discuss the possibility of a referral to Dr. Mardelle Matte for a possible debridement.

## 2016-03-02 ENCOUNTER — Inpatient Hospital Stay: Admission: RE | Admit: 2016-03-02 | Payer: Managed Care, Other (non HMO) | Source: Ambulatory Visit

## 2016-03-02 ENCOUNTER — Ambulatory Visit
Admission: RE | Admit: 2016-03-02 | Discharge: 2016-03-02 | Disposition: A | Discharge status: Home / Self Care | Payer: Managed Care, Other (non HMO) | Source: Ambulatory Visit | Attending: Sports Medicine | Admitting: Sports Medicine

## 2016-03-02 DIAGNOSIS — M25511 Pain in right shoulder: Secondary | ICD-10-CM

## 2016-03-13 ENCOUNTER — Telehealth: Payer: Self-pay | Admitting: Sports Medicine

## 2016-03-13 NOTE — Telephone Encounter (Signed)
I spoke with the patient on the phone today after reviewing the MRI of her right shoulder. She has findings consistent with subacromial bursitis and rotator cuff tendinitis/tendinopathy. She informs me that she has been taking some Voltaren and that has been helpful. I reassured her that she has no full-thickness rotator cuff tear and I've recommended that she return to the office for a repeat subacromial cortisone injection. I also discussed the possibility of formal physical therapy but her work schedule will not allow for this. Therefore, I will be sure to give her a Jobe exercise home program at follow-up.

## 2016-03-23 ENCOUNTER — Ambulatory Visit (INDEPENDENT_AMBULATORY_CARE_PROVIDER_SITE_OTHER): Payer: Managed Care, Other (non HMO) | Admitting: Sports Medicine

## 2016-03-23 ENCOUNTER — Encounter: Payer: Self-pay | Admitting: Sports Medicine

## 2016-03-23 VITALS — BP 109/62 | HR 83 | Ht 66.0 in | Wt 145.0 lb

## 2016-03-23 DIAGNOSIS — M7551 Bursitis of right shoulder: Secondary | ICD-10-CM

## 2016-03-23 MED ORDER — METHYLPREDNISOLONE ACETATE 40 MG/ML IJ SUSP
40.0000 mg | Freq: Once | INTRAMUSCULAR | Status: AC
  Administered 2016-03-23: 40 mg via INTRA_ARTICULAR

## 2016-03-23 NOTE — Progress Notes (Signed)
Patient ID: Felicia Cain, female   DOB: 27-Jan-1956, 60 y.o.   MRN: [id number]  Patient comes in today for a subacromial cortisone injection into her right shoulder. Recent MRI showed subacromial bursitis and rotator cuff tendinitis/tendinopathy. No definite tear was seen. Cortisone injection was administered today into the subacromial space. Patient tolerated this without difficulty. She was also educated in a home Jobe exercise program. We discussed the possibility of formal physical therapy at integrative physical therapy if symptoms do not improve. I also asked her to refrain from repetitive overhead lifting during her workouts as this will likely reaggravate her condition. Follow-up as needed.  Consent obtained and verified. Time-out conducted. Noted no overlying erythema, induration, or other signs of local infection. Skin prepped in a sterile fashion. Topical analgesic spray: Ethyl chloride. Joint: right shoulder (subacromial space) Needle: 25g 1.5 inch Completed without difficulty. Meds: 3cc 1% xylocaine, 1cc (40mg ) depomedrol  Advised to call if fevers/chills, erythema, induration, drainage, or persistent bleeding.

## 2016-03-30 ENCOUNTER — Other Ambulatory Visit: Payer: Self-pay | Admitting: Neurology

## 2016-03-30 NOTE — Telephone Encounter (Signed)
RX for modafinil faxed to [facility]. Received a receipt of confirmation.

## 2016-03-31 ENCOUNTER — Ambulatory Visit (INDEPENDENT_AMBULATORY_CARE_PROVIDER_SITE_OTHER): Payer: Managed Care, Other (non HMO) | Admitting: Neurology

## 2016-03-31 DIAGNOSIS — G4733 Obstructive sleep apnea (adult) (pediatric): Secondary | ICD-10-CM

## 2016-04-02 ENCOUNTER — Encounter: Payer: Self-pay | Admitting: Neurology

## 2016-04-03 MED ORDER — DESVENLAFAXINE SUCCINATE ER 50 MG PO TB24: 50.0000 mg | ORAL_TABLET | Freq: Every day | ORAL | 3 refills | Status: DC

## 2016-04-03 NOTE — Telephone Encounter (Signed)
I have changed from efxor to pristiq 50 mg daily without weaning. Shall we do it. ?

## 2016-04-06 NOTE — Telephone Encounter (Signed)
Dear Felicia Cain, I sent the [place] prescription out on Friday. Your sleep study was excellent -you have really no apnea left by using the dental device.  Asencion Partridge

## 2016-04-07 NOTE — Telephone Encounter (Signed)
Glad to hear you do better, Greetings from [place]

## 2016-06-29 ENCOUNTER — Other Ambulatory Visit: Payer: Self-pay | Admitting: Neurology

## 2016-06-30 NOTE — Telephone Encounter (Signed)
RX for modafinil faxed to [facility]. Received a receipt of confirmation.

## 2016-07-27 ENCOUNTER — Ambulatory Visit (INDEPENDENT_AMBULATORY_CARE_PROVIDER_SITE_OTHER): Payer: Managed Care, Other (non HMO) | Admitting: Neurology

## 2016-07-27 ENCOUNTER — Encounter: Payer: Self-pay | Admitting: Neurology

## 2016-07-27 VITALS — BP 115/67 | HR 83 | Resp 20 | Ht 66.0 in | Wt 151.9 lb

## 2016-07-27 DIAGNOSIS — H02402 Unspecified ptosis of left eyelid: Secondary | ICD-10-CM

## 2016-07-27 DIAGNOSIS — G4730 Sleep apnea, unspecified: Secondary | ICD-10-CM | POA: Diagnosis not present

## 2016-07-27 DIAGNOSIS — G4710 Hypersomnia, unspecified: Secondary | ICD-10-CM

## 2016-07-27 MED ORDER — PYRIDOSTIGMINE BROMIDE 60 MG PO TABS: 60.0000 mg | ORAL_TABLET | Freq: Three times a day (TID) | ORAL | 5 refills | Status: DC

## 2016-07-27 MED ORDER — MODAFINIL 100 MG PO TABS: 100.0000 mg | ORAL_TABLET | Freq: Every day | ORAL | 5 refills | Status: DC
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
## 2016-07-27 NOTE — Patient Instructions (Signed)
Hypersomnia Introduction Hypersomnia is when you feel extremely tired during the day even though you're getting plenty of sleep at night. You may need to take naps during the day, and you may also be extremely difficult to wake up when you are sleeping. What are the causes? The cause of your hypersomnia may not be known. Hypersomnia may be caused by:  Medicines.  Sleep disorders, such as narcolepsy.  Trauma or injury to your head or nervous system.  Using drugs or alcohol.  Tumors.  Medical conditions, such as depression or hypothyroidism.  Genetics. What are the signs or symptoms? The main symptoms of hypersomnia include:  Feeling extremely tired throughout the day.  Being very difficult to wake up.  Sleeping for longer and longer periods.  Taking naps throughout the day. Other symptoms may include:  Feeling:  Restless.  Annoyed.  Anxious.  Low energy.  Having difficulty:  Remembering.  Speaking.  Thinking.  Losing your appetite.  Experiencing hallucinations. How is this diagnosed? Hypersomnia may be diagnosed by:  Medical history and physical exam. This will include a sleep history.  Completing sleep logs.  Tests may also be done, such as:  Polysomnography.  Multiple sleep latency test (MSLT). How is this treated? There is no cure for hypersomnia, but treatment can be very effective in helping manage the condition. Treatment may include:  Lifestyle and sleeping strategies to help cope with the condition.  Stimulant medicines.  Treating any underlying causes of hypersomnia. Follow these instructions at home:  Take medicines only as directed by your health care provider.  Schedule short naps for when you feel sleepiest during the day. Tell your employer or teachers that you have hypersomnia. You may be able to adjust your schedule to include time for naps.  Avoid drinking alcohol or caffeinated beverages.  Do not eat a heavy meal before  bedtime. Eat at about the same times every day.  Do not drive or operate heavy machinery if you are sleepy.  Do not swim or go out on the water without a life jacket.  If possible, adjust your schedule so that you do not have to work or be active at night.  Keep all follow-up visits as directed by your health care provider. This is important. Contact a health care provider if:  You have new symptoms.  Your symptoms get worse. Get help right away if: You have serious thoughts of hurting yourself or someone else. This information is not intended to replace advice given to you by your health care provider. Make sure you discuss any questions you have with your health care provider. Document Released: 08/14/2002 Document Revised: 01/30/2016 Document Reviewed: 03/29/2014  2017 Elsevier  

## 2016-10-01 MED FILL — DOXYCYCLINE HYC 50 MG CAP: 50 | 30 days supply | Qty: 30 | Fill #0

## 2016-10-01 MED FILL — MODAFINIL 100 MG TABLET: 100 | 30 days supply | Qty: 30 | Fill #0

## 2016-10-20 DIAGNOSIS — I8312 Varicose veins of left lower extremity with inflammation: Secondary | ICD-10-CM | POA: Diagnosis not present

## 2016-10-20 DIAGNOSIS — I8311 Varicose veins of right lower extremity with inflammation: Secondary | ICD-10-CM | POA: Diagnosis not present

## 2016-10-20 DIAGNOSIS — I83893 Varicose veins of bilateral lower extremities with other complications: Secondary | ICD-10-CM | POA: Diagnosis not present

## 2016-10-20 DIAGNOSIS — I83813 Varicose veins of bilateral lower extremities with pain: Secondary | ICD-10-CM | POA: Diagnosis not present

## 2016-10-23 DIAGNOSIS — G4733 Obstructive sleep apnea (adult) (pediatric): Secondary | ICD-10-CM | POA: Diagnosis not present

## 2016-10-23 DIAGNOSIS — H11823 Conjunctivochalasis, bilateral: Secondary | ICD-10-CM | POA: Diagnosis not present

## 2016-10-23 DIAGNOSIS — H16223 Keratoconjunctivitis sicca, not specified as Sjogren's, bilateral: Secondary | ICD-10-CM | POA: Diagnosis not present

## 2016-10-23 DIAGNOSIS — H468 Other optic neuritis: Secondary | ICD-10-CM | POA: Diagnosis not present

## 2016-10-23 DIAGNOSIS — H18833 Recurrent erosion of cornea, bilateral: Secondary | ICD-10-CM | POA: Diagnosis not present

## 2016-10-28 DIAGNOSIS — H04123 Dry eye syndrome of bilateral lacrimal glands: Secondary | ICD-10-CM | POA: Diagnosis not present

## 2016-10-28 DIAGNOSIS — H18833 Recurrent erosion of cornea, bilateral: Secondary | ICD-10-CM | POA: Diagnosis not present

## 2016-10-28 DIAGNOSIS — H5713 Ocular pain, bilateral: Secondary | ICD-10-CM | POA: Diagnosis not present

## 2016-10-28 DIAGNOSIS — H16223 Keratoconjunctivitis sicca, not specified as Sjogren's, bilateral: Secondary | ICD-10-CM | POA: Diagnosis not present

## 2016-10-28 DIAGNOSIS — H11823 Conjunctivochalasis, bilateral: Secondary | ICD-10-CM | POA: Diagnosis not present

## 2016-10-28 MED FILL — DOXYCYCLINE HYC 50 MG CAP: 50 | 30 days supply | Qty: 30 | Fill #1

## 2016-11-02 DIAGNOSIS — N651 Disproportion of reconstructed breast: Secondary | ICD-10-CM | POA: Diagnosis not present

## 2016-11-02 DIAGNOSIS — T8544XA Capsular contracture of breast implant, initial encounter: Secondary | ICD-10-CM | POA: Diagnosis not present

## 2016-11-02 DIAGNOSIS — C50919 Malignant neoplasm of unspecified site of unspecified female breast: Secondary | ICD-10-CM | POA: Diagnosis not present

## 2016-11-12 DIAGNOSIS — Z6825 Body mass index (BMI) 25.0-25.9, adult: Secondary | ICD-10-CM | POA: Diagnosis not present

## 2016-11-12 DIAGNOSIS — Z01419 Encounter for gynecological examination (general) (routine) without abnormal findings: Secondary | ICD-10-CM | POA: Diagnosis not present

## 2016-11-12 DIAGNOSIS — R350 Frequency of micturition: Secondary | ICD-10-CM | POA: Diagnosis not present

## 2016-11-12 DIAGNOSIS — Z1151 Encounter for screening for human papillomavirus (HPV): Secondary | ICD-10-CM | POA: Diagnosis not present

## 2016-11-16 DIAGNOSIS — Z13220 Encounter for screening for lipoid disorders: Secondary | ICD-10-CM | POA: Diagnosis not present

## 2016-11-16 DIAGNOSIS — Z1329 Encounter for screening for other suspected endocrine disorder: Secondary | ICD-10-CM | POA: Diagnosis not present

## 2016-11-16 DIAGNOSIS — Z131 Encounter for screening for diabetes mellitus: Secondary | ICD-10-CM | POA: Diagnosis not present

## 2016-11-16 DIAGNOSIS — Z130 Encounter for screening for diseases of the blood and blood-forming organs and certain disorders involving the immune mechanism: Secondary | ICD-10-CM | POA: Diagnosis not present

## 2016-11-16 DIAGNOSIS — Z0000 Encounter for general adult medical examination without abnormal findings: Secondary | ICD-10-CM | POA: Diagnosis not present

## 2016-12-03 MED FILL — DOXYCYCLINE HYC 50 MG CAP: 50 | 30 days supply | Qty: 30 | Fill #2

## 2016-12-14 ENCOUNTER — Ambulatory Visit (INDEPENDENT_AMBULATORY_CARE_PROVIDER_SITE_OTHER): Payer: 59

## 2016-12-14 ENCOUNTER — Encounter: Payer: Self-pay | Admitting: Podiatry

## 2016-12-14 ENCOUNTER — Other Ambulatory Visit: Payer: Self-pay

## 2016-12-14 ENCOUNTER — Ambulatory Visit (INDEPENDENT_AMBULATORY_CARE_PROVIDER_SITE_OTHER): Payer: 59 | Admitting: Podiatry

## 2016-12-14 VITALS — BP 107/64 | HR 84 | Resp 16 | Ht 66.0 in | Wt 149.0 lb

## 2016-12-14 DIAGNOSIS — M2010 Hallux valgus (acquired), unspecified foot: Secondary | ICD-10-CM

## 2016-12-14 DIAGNOSIS — M779 Enthesopathy, unspecified: Secondary | ICD-10-CM

## 2016-12-14 DIAGNOSIS — D169 Benign neoplasm of bone and articular cartilage, unspecified: Secondary | ICD-10-CM

## 2016-12-14 DIAGNOSIS — M7750 Other enthesopathy of unspecified foot: Secondary | ICD-10-CM | POA: Diagnosis not present

## 2016-12-14 DIAGNOSIS — Z780 Asymptomatic menopausal state: Secondary | ICD-10-CM | POA: Diagnosis not present

## 2016-12-14 DIAGNOSIS — R52 Pain, unspecified: Secondary | ICD-10-CM

## 2016-12-14 DIAGNOSIS — M8589 Other specified disorders of bone density and structure, multiple sites: Secondary | ICD-10-CM | POA: Diagnosis not present

## 2016-12-14 NOTE — Progress Notes (Signed)
Subjective:     Patient ID: Felicia Cain, female   DOB: [birth year], 61 y.o.   MRN: [id number]  HPI patient states she's having a lot of pain around her left big toenail and also her big toe joint bothers her at different times. States that the pain in her left big toe is worse at the end of the toe and seems to also involve the nail   Review of Systems  All other systems reviewed and are negative.      Objective:   Physical Exam  Constitutional: She is oriented to person, place, and time.  Cardiovascular: Intact distal pulses.   Musculoskeletal: Normal range of motion.  Neurological: She is oriented to person, place, and time.  Skin: Skin is warm.  Nursing note and vitals reviewed.  neurovascular status found to be intact with muscle strength adequate range of motion within normal limits with patient found to have moderate diminishment range of motion first MPJ with dorsal medial spurring and noted to have distal irritation of the left hallux mostly on the medial side of the nailbed with minimal proximal pain noted     Assessment:     Hallux limitus deformity left with moderate spur formation and possibility for distal spur left big toe with possibility for nail involvement    Plan:     H&P conditions reviewed. At this point patient will be scanned for orthotics by head orthotist for a dress type orthotic device. I then discussed the possibility for structural hallux limitus correction and distal exostectomy with no indications that eventually this could require more advanced surgery. Patient may ultimately lose the left hallux nail and understands this. At this time I reviewed her x-rays and scheduled her to be seen by [facility] orthotist and I will see her mid-May with surgery tenably scheduled for mid-June. After her son graduates medical school  X-rays indicate spur formation of the first metatarsal head left with what appears to be a distal spur on the left hallux and a dorsal  direction

## 2016-12-15 ENCOUNTER — Other Ambulatory Visit: Payer: 59

## 2016-12-18 DIAGNOSIS — I8311 Varicose veins of right lower extremity with inflammation: Secondary | ICD-10-CM | POA: Diagnosis not present

## 2016-12-18 DIAGNOSIS — I83891 Varicose veins of right lower extremities with other complications: Secondary | ICD-10-CM | POA: Diagnosis not present

## 2016-12-28 DIAGNOSIS — M79604 Pain in right leg: Secondary | ICD-10-CM | POA: Diagnosis not present

## 2016-12-28 DIAGNOSIS — M7981 Nontraumatic hematoma of soft tissue: Secondary | ICD-10-CM | POA: Diagnosis not present

## 2017-01-04 MED FILL — DOXYCYCLINE HYC 50 MG CAP: 50 | 30 days supply | Qty: 30 | Fill #3

## 2017-01-06 DIAGNOSIS — M79604 Pain in right leg: Secondary | ICD-10-CM | POA: Diagnosis not present

## 2017-01-06 DIAGNOSIS — M7981 Nontraumatic hematoma of soft tissue: Secondary | ICD-10-CM | POA: Diagnosis not present

## 2017-01-07 ENCOUNTER — Other Ambulatory Visit: Payer: 59

## 2017-01-11 ENCOUNTER — Other Ambulatory Visit: Payer: 59

## 2017-01-24 DIAGNOSIS — S72044A Nondisplaced fracture of base of neck of right femur, initial encounter for closed fracture: Secondary | ICD-10-CM | POA: Diagnosis not present

## 2017-01-24 DIAGNOSIS — G4733 Obstructive sleep apnea (adult) (pediatric): Secondary | ICD-10-CM | POA: Diagnosis not present

## 2017-01-24 DIAGNOSIS — S72034A Nondisplaced midcervical fracture of right femur, initial encounter for closed fracture: Secondary | ICD-10-CM | POA: Diagnosis not present

## 2017-01-24 DIAGNOSIS — Z743 Need for continuous supervision: Secondary | ICD-10-CM | POA: Diagnosis not present

## 2017-01-24 DIAGNOSIS — T148XXA Other injury of unspecified body region, initial encounter: Secondary | ICD-10-CM | POA: Diagnosis not present

## 2017-01-24 DIAGNOSIS — S72012A Unspecified intracapsular fracture of left femur, initial encounter for closed fracture: Secondary | ICD-10-CM | POA: Diagnosis not present

## 2017-01-24 DIAGNOSIS — H1850 Unspecified hereditary corneal dystrophies: Secondary | ICD-10-CM | POA: Diagnosis not present

## 2017-01-24 DIAGNOSIS — Z853 Personal history of malignant neoplasm of breast: Secondary | ICD-10-CM | POA: Diagnosis not present

## 2017-01-24 DIAGNOSIS — S72001A Fracture of unspecified part of neck of right femur, initial encounter for closed fracture: Secondary | ICD-10-CM | POA: Diagnosis not present

## 2017-01-25 ENCOUNTER — Encounter (HOSPITAL_COMMUNITY): Payer: Self-pay | Admitting: General Practice

## 2017-01-25 ENCOUNTER — Inpatient Hospital Stay (HOSPITAL_COMMUNITY): Payer: 59

## 2017-01-25 ENCOUNTER — Inpatient Hospital Stay (HOSPITAL_COMMUNITY)
Admission: AD | Admit: 2017-01-25 | Discharge: 2017-01-29 | DRG: 482 | Disposition: A | Discharge status: Home / Self Care | Payer: 59 | Source: Other Acute Inpatient Hospital | Attending: Internal Medicine | Admitting: Internal Medicine

## 2017-01-25 DIAGNOSIS — Z79899 Other long term (current) drug therapy: Secondary | ICD-10-CM | POA: Diagnosis not present

## 2017-01-25 DIAGNOSIS — S0500XS Injury of conjunctiva and corneal abrasion without foreign body, unspecified eye, sequela: Secondary | ICD-10-CM | POA: Diagnosis not present

## 2017-01-25 DIAGNOSIS — Z882 Allergy status to sulfonamides status: Secondary | ICD-10-CM | POA: Diagnosis not present

## 2017-01-25 DIAGNOSIS — W109XXA Fall (on) (from) unspecified stairs and steps, initial encounter: Secondary | ICD-10-CM | POA: Diagnosis present

## 2017-01-25 DIAGNOSIS — Z853 Personal history of malignant neoplasm of breast: Secondary | ICD-10-CM | POA: Diagnosis not present

## 2017-01-25 DIAGNOSIS — S7291XA Unspecified fracture of right femur, initial encounter for closed fracture: Secondary | ICD-10-CM | POA: Diagnosis not present

## 2017-01-25 DIAGNOSIS — W19XXXD Unspecified fall, subsequent encounter: Secondary | ICD-10-CM

## 2017-01-25 DIAGNOSIS — G629 Polyneuropathy, unspecified: Secondary | ICD-10-CM | POA: Diagnosis not present

## 2017-01-25 DIAGNOSIS — S72002A Fracture of unspecified part of neck of left femur, initial encounter for closed fracture: Secondary | ICD-10-CM | POA: Insufficient documentation

## 2017-01-25 DIAGNOSIS — Z9013 Acquired absence of bilateral breasts and nipples: Secondary | ICD-10-CM

## 2017-01-25 DIAGNOSIS — Z01818 Encounter for other preprocedural examination: Secondary | ICD-10-CM | POA: Diagnosis not present

## 2017-01-25 DIAGNOSIS — R110 Nausea: Secondary | ICD-10-CM | POA: Diagnosis present

## 2017-01-25 DIAGNOSIS — S0500XA Injury of conjunctiva and corneal abrasion without foreign body, unspecified eye, initial encounter: Secondary | ICD-10-CM | POA: Diagnosis present

## 2017-01-25 DIAGNOSIS — S728X1A Other fracture of right femur, initial encounter for closed fracture: Secondary | ICD-10-CM | POA: Diagnosis not present

## 2017-01-25 DIAGNOSIS — D649 Anemia, unspecified: Secondary | ICD-10-CM | POA: Diagnosis not present

## 2017-01-25 DIAGNOSIS — S0500XD Injury of conjunctiva and corneal abrasion without foreign body, unspecified eye, subsequent encounter: Secondary | ICD-10-CM | POA: Diagnosis not present

## 2017-01-25 DIAGNOSIS — S72301A Unspecified fracture of shaft of right femur, initial encounter for closed fracture: Secondary | ICD-10-CM | POA: Diagnosis not present

## 2017-01-25 DIAGNOSIS — S72034A Nondisplaced midcervical fracture of right femur, initial encounter for closed fracture: Secondary | ICD-10-CM | POA: Diagnosis not present

## 2017-01-25 DIAGNOSIS — S72009A Fracture of unspecified part of neck of unspecified femur, initial encounter for closed fracture: Secondary | ICD-10-CM | POA: Diagnosis not present

## 2017-01-25 DIAGNOSIS — F419 Anxiety disorder, unspecified: Secondary | ICD-10-CM | POA: Diagnosis present

## 2017-01-25 DIAGNOSIS — R2689 Other abnormalities of gait and mobility: Secondary | ICD-10-CM | POA: Diagnosis not present

## 2017-01-25 DIAGNOSIS — W19XXXA Unspecified fall, initial encounter: Secondary | ICD-10-CM | POA: Diagnosis present

## 2017-01-25 DIAGNOSIS — G4733 Obstructive sleep apnea (adult) (pediatric): Secondary | ICD-10-CM | POA: Diagnosis not present

## 2017-01-25 DIAGNOSIS — K219 Gastro-esophageal reflux disease without esophagitis: Secondary | ICD-10-CM | POA: Diagnosis present

## 2017-01-25 DIAGNOSIS — Z888 Allergy status to other drugs, medicaments and biological substances status: Secondary | ICD-10-CM

## 2017-01-25 DIAGNOSIS — M5412 Radiculopathy, cervical region: Secondary | ICD-10-CM | POA: Diagnosis present

## 2017-01-25 DIAGNOSIS — Z91040 Latex allergy status: Secondary | ICD-10-CM

## 2017-01-25 DIAGNOSIS — M797 Fibromyalgia: Secondary | ICD-10-CM | POA: Diagnosis not present

## 2017-01-25 DIAGNOSIS — S72001A Fracture of unspecified part of neck of right femur, initial encounter for closed fracture: Secondary | ICD-10-CM | POA: Diagnosis not present

## 2017-01-25 DIAGNOSIS — Z419 Encounter for procedure for purposes other than remedying health state, unspecified: Secondary | ICD-10-CM

## 2017-01-25 DIAGNOSIS — Z7982 Long term (current) use of aspirin: Secondary | ICD-10-CM

## 2017-01-25 DIAGNOSIS — H1850 Unspecified hereditary corneal dystrophies: Secondary | ICD-10-CM | POA: Diagnosis not present

## 2017-01-25 DIAGNOSIS — S72001S Fracture of unspecified part of neck of right femur, sequela: Secondary | ICD-10-CM | POA: Diagnosis not present

## 2017-01-25 DIAGNOSIS — G7000 Myasthenia gravis without (acute) exacerbation: Secondary | ICD-10-CM | POA: Diagnosis not present

## 2017-01-25 DIAGNOSIS — G7001 Myasthenia gravis with (acute) exacerbation: Secondary | ICD-10-CM

## 2017-01-25 HISTORY — DX: Sleep apnea, unspecified: G47.30

## 2017-01-25 HISTORY — DX: Hepatitis a without hepatic coma: B15.9

## 2017-01-25 HISTORY — DX: Gastro-esophageal reflux disease without esophagitis: K21.9

## 2017-01-25 HISTORY — DX: Low back pain: M54.5

## 2017-01-25 HISTORY — DX: Low back pain, unspecified: M54.50

## 2017-01-25 HISTORY — DX: Unspecified osteoarthritis, unspecified site: M19.90

## 2017-01-25 HISTORY — DX: Ischemic optic neuropathy, right eye: H47.011

## 2017-01-25 HISTORY — DX: Nausea with vomiting, unspecified: R11.2

## 2017-01-25 HISTORY — DX: Other specified postprocedural states: Z98.890

## 2017-01-25 HISTORY — DX: Migraine, unspecified, not intractable, without status migrainosus: G43.909

## 2017-01-25 HISTORY — DX: Other chronic pain: G89.29

## 2017-01-25 HISTORY — DX: Anxiety disorder, unspecified: F41.9

## 2017-01-25 LAB — CBC
HCT: 37.3 % (ref 36.0–46.0)
HEMOGLOBIN: 12.4 g/dL (ref 12.0–15.0)
MCH: 30.2 pg (ref 26.0–34.0)
MCHC: 33.2 g/dL (ref 30.0–36.0)
MCV: 91 fL (ref 78.0–100.0)
PLATELETS: 245 10*3/uL (ref 150–400)
RBC: 4.1 MIL/uL (ref 3.87–5.11)
RDW: 13.1 % (ref 11.5–15.5)
WBC: 8.8 10*3/uL (ref 4.0–10.5)

## 2017-01-25 LAB — BASIC METABOLIC PANEL
Anion gap: 7 (ref 5–15)
BUN: 21 mg/dL — AB (ref 6–20)
CHLORIDE: 106 mmol/L (ref 101–111)
CO2: 24 mmol/L (ref 22–32)
CREATININE: 0.61 mg/dL (ref 0.44–1.00)
Calcium: 8.4 mg/dL — ABNORMAL LOW (ref 8.9–10.3)
GFR calc non Af Amer: >60 mL/min (ref >60)
GLUCOSE: 102 mg/dL — AB (ref 65–99)
Potassium: 3.6 mmol/L (ref 3.5–5.1)
SODIUM: 137 mmol/L (ref 135–145)

## 2017-01-25 LAB — PROTIME-INR
INR: 1.05
PROTHROMBIN TIME: 13.7 s (ref 11.4–15.2)

## 2017-01-25 LAB — APTT: aPTT: 39 seconds — ABNORMAL HIGH (ref 24–36)

## 2017-01-25 MED ORDER — TEMAZEPAM 15 MG PO CAPS: 15.0000 mg | ORAL_CAPSULE | Freq: Every evening | ORAL | Status: DC | PRN

## 2017-01-25 MED ORDER — HEPARIN SODIUM (PORCINE) 5000 UNIT/ML IJ SOLN: 5000.0000 [IU] | Freq: Three times a day (TID) | INTRAMUSCULAR | Status: DC

## 2017-01-25 MED ORDER — POLYETHYLENE GLYCOL 3350 17 G PO PACK
17.0000 g | PACK | Freq: Two times a day (BID) | ORAL | Status: DC | PRN
  Filled 2017-01-25: qty 1

## 2017-01-25 MED ORDER — ONDANSETRON HCL 4 MG/2ML IJ SOLN
4.0000 mg | Freq: Four times a day (QID) | INTRAMUSCULAR | Status: DC | PRN
  Administered 2017-01-26: 4 mg via INTRAVENOUS
  Filled 2017-01-25: qty 2

## 2017-01-25 MED ORDER — ONDANSETRON HCL 4 MG PO TABS: 4.0000 mg | ORAL_TABLET | Freq: Four times a day (QID) | ORAL | Status: DC | PRN

## 2017-01-25 MED ORDER — ACETAMINOPHEN 500 MG PO TABS
1000.0000 mg | ORAL_TABLET | Freq: Once | ORAL | Status: AC
  Administered 2017-01-25: 500 mg via ORAL
  Filled 2017-01-25: qty 2

## 2017-01-25 MED ORDER — GABAPENTIN 300 MG PO CAPS
300.0000 mg | ORAL_CAPSULE | Freq: Every day | ORAL | Status: DC
  Administered 2017-01-26 – 2017-01-29 (×4): 300 mg via ORAL
  Filled 2017-01-25 (×4): qty 1

## 2017-01-25 MED ORDER — SODIUM CHLORIDE 0.9 % IV SOLN
INTRAVENOUS | Status: DC
Start: 2017-01-25 — End: 2017-01-25
  Administered 2017-01-25: 14:00:00 via INTRAVENOUS

## 2017-01-25 MED ORDER — METHOCARBAMOL 1000 MG/10ML IJ SOLN
1000.0000 mg | Freq: Four times a day (QID) | INTRAVENOUS | Status: DC | PRN
  Administered 2017-01-26 – 2017-01-28 (×4): 1000 mg via INTRAVENOUS
  Filled 2017-01-25 (×5): qty 10

## 2017-01-25 MED ORDER — HYDROMORPHONE HCL 1 MG/ML IJ SOLN
0.5000 mg | INTRAMUSCULAR | Status: DC | PRN
  Administered 2017-01-25: 1 mg via INTRAVENOUS
  Administered 2017-01-26: 0.5 mg via INTRAVENOUS
  Filled 2017-01-25 (×3): qty 1

## 2017-01-25 MED ORDER — DOXYCYCLINE HYCLATE 100 MG PO TABS
100.0000 mg | ORAL_TABLET | Freq: Every day | ORAL | Status: DC
  Administered 2017-01-25 – 2017-01-29 (×5): 100 mg via ORAL
  Filled 2017-01-25 (×5): qty 1

## 2017-01-25 MED ORDER — OXYCODONE HCL 5 MG PO TABS
5.0000 mg | ORAL_TABLET | ORAL | Status: DC | PRN
  Administered 2017-01-25 – 2017-01-26 (×3): 5 mg via ORAL
  Filled 2017-01-25 (×3): qty 1

## 2017-01-25 NOTE — H&P (Signed)
History and Physical    Felicia Cain ZOX:[id number] DOB: 1956-08-31 DOA: 01/25/2017  PCP: Lanice Shirts, MD Patient coming from: [facility] hospital   Chief Complaint: R hip pain  HPI: Felicia Cain is a 61 y.o. female with medical history significant of anxiety/depression, DCIS R breast s/p mastecomty, fibromyalgia, esophageal stricture, optic neuritis, Myasthenia gravis. Presenting 24hrs after falling and striking R hip on the pavement. Patient reports falling just after attending her son's medical school graduation. While ambul Ms. day irregularity in the pavement due to lack of peripheral vision in the right eye. This caused her to fall and strike her right hip. Patient denies any head trauma. Right hip immediately painful. Constant. Worse with certain movements. Improves with narcotic medications given [facility]. Currently also complaining of minimal right great toe numbness which is [place] over the last few hours. Denies any recent fevers, chest pain, palpitations, LOC, vertigo, dizziness, abdominal pain, dysuria, frequency.   ED Course: NA  Review of Systems: As per HPI otherwise all other systems reviewed and are negative  Ambulatory Status: prior to injury, no restrictions  Past Medical History:  Diagnosis Date  . Anxiety and depression   . Brachial neuritis    neuropathy  . Cancer ([place])   . DCIS (ductal carcinoma in situ)   . Esophageal stricture    stricture with dysphasia  . Fibromyalgia   . H/O Doppler ultrasound 2006   see scanned study  . H/O echocardiogram 2004, 2013  . History of cardiac monitoring 2006  . History of nuclear stress test 2004   see scanned study  . Myasthenia gravis in crisis [facility]) 04/2014   respiratory crisis  . Near syncope   . Neuromuscular disorder ([place])   . Neuropathy   . Optic neuritis    ischaemic optic neuritis, non arteric  . Optic neuropathy   . Posterior optic neuritis   . Ptosis of eyelid   . Spinal stenosis      Past Surgical History:  Procedure Laterality Date  . BACK SURGERY     lumbar fusion, Dr [id number]  . BREAST ENHANCEMENT SURGERY     2000, after breast cancer surgery,redone 2010  . BREAST SURGERY    . CARPAL TUNNEL RELEASE Bilateral   . CESAREAN SECTION    . LAMINECTOMY     L-4-L5:2000  . masectomy  bilat  . MASTECTOMY      Social History   Social History  . Marital status: Widowed    Spouse name: N/A  . Number of children: N/A  . Years of education: N/A   Occupational History  . Not on file.   Social History Main Topics  . Smoking status: Never Smoker  . Smokeless tobacco: Never Used  . Alcohol use No  . Drug use: No  . Sexual activity: Not on file   Other Topics Concern  . Not on file   Social History Narrative  . No narrative on file    Allergies  Allergen Reactions  . Tape Rash     Blistering on site, can only use paper tape or surgical.  . Sulfa Antibiotics Swelling    Redness, topical  . Adderall [Amphetamine-Dextroamphetamine] Other (See Comments)    headache  . Latex Itching and Swelling    Family History  Problem Relation Age of Onset  . Stroke Mother   . Hypertension Mother   . Early death Father   . Cancer Father 30       sarcoma  .  Parkinson's disease Maternal Aunt   . Cancer Maternal Uncle   . Cancer Paternal Grandmother        breast    Prior to Admission medications   Medication Sig Start Date End Date Taking? Authorizing Provider  aspirin (GOODSENSE ASPIRIN) 81 MG chewable tablet Chew by mouth.    [provider]  Black Cohosh 450 MG CAPS Take 1 tablet by mouth daily.    [provider]  Calcium Carbonate (CALCIUM 600 PO) Take 1 tablet by mouth daily.    [provider]  cholecalciferol (VITAMIN D) 1000 UNITS tablet Take 2,000 Units by mouth daily.    [provider]  doxycycline (VIBRAMYCIN) 100 MG capsule Take by mouth. 05/16/12   [provider]    Physical Exam: There were  no vitals filed for this visit.   General:  Appears calm and comfortable Eyes:  PERRL, EOMI, normal lids, iris ENT:  grossly normal hearing, lips & tongue, mmm Neck:  no LAD, masses or thyromegaly Cardiovascular:  RRR, no m/r/g. No LE edema.  Respiratory:  CTA bilaterally, no w/r/r. Normal respiratory effort. Abdomen:  soft, ntnd, NABS Skin:  no rash or induration seen on limited exam Musculoskeletal: R leg extended w/o appreciable bony abnormality. FROM UE bilat.  Psychiatric:  grossly normal mood and affect, speech fluent and appropriate, AOx3 Neurologic:  CN 2-12 grossly intact, moves all extremities in coordinated fashion, R great toe numb to palpation.   Labs on Admission: I have personally reviewed following labs and imaging studies  CBC: No results for input(s): WBC, NEUTROABS, HGB, HCT, MCV, PLT in the last 168 hours. Basic Metabolic Panel: No results for input(s): NA, K, CL, CO2, GLUCOSE, BUN, CREATININE, CALCIUM, MG, PHOS in the last 168 hours. GFR: CrCl cannot be calculated (Patient's most recent lab result is older than the maximum 21 days allowed.). Liver Function Tests: No results for input(s): AST, ALT, ALKPHOS, BILITOT, PROT, ALBUMIN in the last 168 hours. No results for input(s): LIPASE, AMYLASE in the last 168 hours. No results for input(s): AMMONIA in the last 168 hours. Coagulation Profile: No results for input(s): INR, PROTIME in the last 168 hours. Cardiac Enzymes: No results for input(s): CKTOTAL, CKMB, CKMBINDEX, TROPONINI in the last 168 hours. BNP (last 3 results) No results for input(s): PROBNP in the last 8760 hours. HbA1C: No results for input(s): HGBA1C in the last 72 hours. CBG: No results for input(s): GLUCAP in the last 168 hours. Lipid Profile: No results for input(s): CHOL, HDL, LDLCALC, TRIG, CHOLHDL, LDLDIRECT in the last 72 hours. Thyroid Function Tests: No results for input(s): TSH, T4TOTAL, FREET4, T3FREE, THYROIDAB in the last 72  hours. Anemia Panel: No results for input(s): VITAMINB12, FOLATE, FERRITIN, TIBC, IRON, RETICCTPCT in the last 72 hours. Urine analysis:    Component Value Date/Time   COLORURINE YELLOW 07/19/2015 0726   APPEARANCEUR CLEAR 07/19/2015 0726   LABSPEC 1.009 07/19/2015 0726   PHURINE 7.0 07/19/2015 0726   GLUCOSEU NEGATIVE 07/19/2015 0726   HGBUR TRACE (A) 07/19/2015 0726   BILIRUBINUR NEGATIVE 07/19/2015 0726   BILIRUBINUR neg 09/25/2013 1020   KETONESUR NEGATIVE 07/19/2015 0726   PROTEINUR NEGATIVE 07/19/2015 0726   UROBILINOGEN 0.2 07/19/2015 0726   NITRITE NEGATIVE 07/19/2015 0726   LEUKOCYTESUR NEGATIVE 07/19/2015 0726    Creatinine Clearance: CrCl cannot be calculated (Patient's most recent lab result is older than the maximum 21 days allowed.).  Sepsis Labs: @LABRCNTIP (procalcitonin:4,lacticidven:4) )No results found for this or any previous visit (from the past  240 hour(s)).   Radiological Exams on Admission: No results found.   Assessment/Plan Active Problems:   * No active hospital problems. *    R hip fracture: plain film reviewed from outside hospital w/ non-displaced R femoral neck fracture. Discussed case w/ Dr. Ninfa Linden of Orthopedics who has graceously agreed to see the pt in consultation and likely perform surgical repair/replacement on 5/22. Last Echo EF >55%. - repeat films per ortho - Coags, CXR, EKG,  - PO/IV pain meds ordered - IV robaxin, Tylenol - Heparin x2 then SCD on 01/26/17.  Nausea: secondary to pain. Poor oral intake since injury. 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

## 2017-01-25 NOTE — Consult Note (Signed)
Reason for Consult:  Right hip femoral neck fracture Referring Physician: D. Marily Memos, MD  Felicia Cain is an 62 y.o. female.  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 She unfortunately sustained a nondisplaced right hip femoral neck fracture yesterday when she accidentally tripped landing on her right hip. She was in [place] at the time at her son's medical school graduation when she fell. She denies any syncopal episode or any other injuries as a relates to her chief complaint of right hip pain. She was seen at outside hospital and found to have a nondisplaced right femoral neck fracture. She requested transfer down to [facility] where she is from. She then requested me to treat her hip injury given my experience with hip fractures. She is a very pleasant individual. Her only complaint is right hip pain. Her other concern is her ability to get back to work hopefully soon to be over to "save her job". She is incredibly pleasant individual.  Past Medical History:  Diagnosis Date  . Anxiety   . Arthritis    "back" (01/25/2017)  . Brachial neuritis    neuropathy  . Chronic lower back pain   . DCIS (ductal carcinoma in situ)    "left side?"  . Esophageal stricture    stricture with dysphasia  . Fibromyalgia   . GERD (gastroesophageal reflux disease)   . H/O Doppler ultrasound 2006   see scanned study  . H/O echocardiogram 2004, 2013  . Hepatitis A 1961   [place]"  . History of cardiac monitoring 2006  . History of nuclear stress test 2004   see scanned study  . Migraine    "in the past; cycle related" (01/25/2017)  . Myasthenia gravis in crisis [facility]) 04/2014   respiratory crisis  . NAION (non-arteritic anterior ischemic optic neuropathy), right   . Near syncope   . Neuromuscular disorder ([place])   . Neuropathy   . Optic neuritis    ischaemic optic neuritis, non arteric  . Optic neuropathy   . PONV  (postoperative nausea and vomiting)   . Posterior optic neuritis   . Ptosis of eyelid   . Sleep apnea    "I wear Moses device; I don't wear CPAP" (01/25/2017)  . Spinal stenosis     Past Surgical History:  Procedure Laterality Date  . AUGMENTATION MAMMAPLASTY     2000, after breast cancer surgery,redone 2010  . BACK SURGERY    . BREAST BIOPSY    . CARPAL TUNNEL RELEASE Bilateral   . CESAREAN SECTION  1989  . ESOPHAGOGASTRODUODENOSCOPY (EGD) WITH ESOPHAGEAL DILATION    . INNER EAR SURGERY Right 1995  . LUMBAR LAMINECTOMY Right 1999   [name]  . MASTECTOMY Bilateral     Family History  Problem Relation Age of Onset  . Stroke Mother   . Hypertension Mother   . Early death Father   . Cancer Father 59       sarcoma  . Parkinson's disease Maternal Aunt   . Cancer Maternal Uncle   . Cancer Paternal Grandmother        breast    Social History:  reports that she has never smoked. She has never used smokeless tobacco. She reports that she does not drink alcohol or use drugs.  Allergies:  Allergies  Allergen Reactions  . Tape Rash and Other (See Comments)     Blistering on site, can only use  paper tape or surgical.  . Sulfa Antibiotics Swelling and Other (See Comments)    Redness, topical only  . Adderall [Amphetamine-Dextroamphetamine] Other (See Comments)    headache  . Latex Itching and Swelling    Medications: I have reviewed the patient's current medications.  Results for orders placed or performed during the hospital encounter of 01/25/17 (from the past 48 hour(s))  CBC     Status: None   Collection Time: 01/25/17  2:19 PM  Result Value Ref Range   WBC 8.8 4.0 - 10.5 K/uL   RBC 4.10 3.87 - 5.11 MIL/uL   Hemoglobin 12.4 12.0 - 15.0 g/dL   HCT 37.3 36.0 - 46.0 %   MCV 91.0 78.0 - 100.0 fL   MCH 30.2 26.0 - 34.0 pg   MCHC 33.2 30.0 - 36.0 g/dL   RDW 13.1 11.5 - 15.5 %   Platelets 245 150 - 400 K/uL  Basic metabolic panel     Status: Abnormal   Collection  Time: 01/25/17  2:19 PM  Result Value Ref Range   Sodium 137 135 - 145 mmol/L   Potassium 3.6 3.5 - 5.1 mmol/L   Chloride 106 101 - 111 mmol/L   CO2 24 22 - 32 mmol/L   Glucose, Bld 102 (H) 65 - 99 mg/dL   BUN 21 (H) 6 - 20 mg/dL   Creatinine, Ser 0.61 0.44 - 1.00 mg/dL   Calcium 8.4 (L) 8.9 - 10.3 mg/dL   GFR calc non Af Amer >60 >60 mL/min   GFR calc Af Amer >60 >60 mL/min    Comment: (NOTE) The eGFR has been calculated using the CKD EPI equation. This calculation has not been validated in all clinical situations. eGFR's persistently <60 mL/min signify possible Chronic Kidney Disease.    Anion gap 7 5 - 15  APTT     Status: Abnormal   Collection Time: 01/25/17  2:19 PM  Result Value Ref Range   aPTT 39 (H) 24 - 36 seconds    Comment:        IF BASELINE aPTT IS ELEVATED, SUGGEST PATIENT RISK ASSESSMENT BE USED TO DETERMINE APPROPRIATE ANTICOAGULANT THERAPY.   Protime-INR     Status: None   Collection Time: 01/25/17  2:19 PM  Result Value Ref Range   Prothrombin Time 13.7 11.4 - 15.2 seconds   INR 1.05     Dg Pelvis Portable  Result Date: 01/25/2017 CLINICAL DATA:  Hip fracture EXAM: PORTABLE PELVIS 1-2 VIEWS COMPARISON:  None. FINDINGS: Nondisplaced right femoral neck fracture. No other fracture or dislocation. No lytic or sclerotic osseous lesion. IMPRESSION: Nondisplaced right femoral neck fracture. Electronically Signed   By: Kathreen Devoid   On: 01/25/2017 14:33   Dg Chest Port 1 View  Result Date: 01/25/2017 CLINICAL DATA:  Preop for hip fracture. EXAM: PORTABLE CHEST 1 VIEW COMPARISON:  Radiographs of July 19, 2015. FINDINGS: The heart size and mediastinal contours are within normal limits. Both lungs are clear. No pneumothorax or pleural effusion is noted. The visualized skeletal structures are unremarkable. IMPRESSION: No acute cardiopulmonary abnormality seen. Electronically Signed   By: Marijo Conception, M.D.   On: 01/25/2017 14:31   Independent review of the  pelvic x-ray from today shows a nondisplaced to minimally displaced right femoral neck fracture. Her leg lengths appear near equal.   Review of Systems  Musculoskeletal: Positive for joint pain.  All other systems reviewed and are negative.  Blood pressure 120/63, pulse 78, temperature 98.4 F (36.9  C), temperature source Oral, resp. rate 18, SpO2 100 %. Physical Exam  Constitutional: She is oriented to person, place, and time. She appears well-developed and well-nourished.  Cardiovascular: Normal rate.   Respiratory: Effort normal.  Musculoskeletal:       Right hip: She exhibits decreased range of motion, decreased strength, tenderness and bony tenderness.  Neurological: She is alert and oriented to person, place, and time.    Assessment/Plan: Nondisplaced right hip femoral neck fracture  1) I do feel that given her good bone quality and her age as well as x-rays showing no pre-existing hip arthritis that we can try to treat this fracture using a minimally invasive technique of cannulated screw fixation. I talked her about the different options. Certainly there is a risk of avascular necrosis following insults to the femoral neck and the resulted disruption of blood supply to the hip. Right now x-rays obtained today continue to show a nondisplaced fracture. She understands that certainly she could end up still with an anterior hip replacement if this fails or that we go ahead and replace her hip if the fracture is more significantly displaced at the time of surgery tomorrow. I did talk to her son on the phone as well. Both understand fully the risks and benefits of surgery and we had a long and thorough discussion about this. I did share with her her x-rays as well. Surgery will be planned for tomorrow late afternoon. We'll have her nothing by mouth after midnight. The downside of cannulated screw fixation is that I will need to have her nonweightbearing on that right hip for a minimum of 4  weeks postoperative. She understands this as well. All questions were encouraged and answered.  Mcarthur Rossetti 01/25/2017, 6:21 PM

## 2017-01-25 NOTE — Progress Notes (Signed)
Arrived to the unit vias stretcher to the room as a transfer from [facility] in [place]. Pt A&O x4; pt oriented to the unit and room; fall/safety precaution and prevention education completed and pt voices understanding; call light within reach; bed alarm on; VSS; skin clean dry and intact with no open wounds or pressure ulcer noted except for right hip bruising, warm to touch and pain. MD at bedside and awaiting on new orders for pt. Will continue to closely monitor pt. Delia Heady RN

## 2017-01-26 ENCOUNTER — Encounter (HOSPITAL_COMMUNITY)
Admission: AD | Disposition: A | Discharge status: Home / Self Care | Payer: Self-pay | Source: Other Acute Inpatient Hospital | Attending: Internal Medicine

## 2017-01-26 ENCOUNTER — Encounter (HOSPITAL_COMMUNITY): Payer: Self-pay | Admitting: Certified Registered Nurse Anesthetist

## 2017-01-26 ENCOUNTER — Inpatient Hospital Stay (HOSPITAL_COMMUNITY): Payer: 59

## 2017-01-26 ENCOUNTER — Inpatient Hospital Stay (HOSPITAL_COMMUNITY): Payer: 59 | Admitting: Anesthesiology

## 2017-01-26 HISTORY — PX: HIP PINNING,CANNULATED: SHX1758

## 2017-01-26 LAB — CBC
HCT: 37.5 % (ref 36.0–46.0)
Hemoglobin: 12.7 g/dL (ref 12.0–15.0)
MCH: 30.8 pg (ref 26.0–34.0)
MCHC: 33.9 g/dL (ref 30.0–36.0)
MCV: 90.8 fL (ref 78.0–100.0)
PLATELETS: 243 10*3/uL (ref 150–400)
RBC: 4.13 MIL/uL (ref 3.87–5.11)
RDW: 13.1 % (ref 11.5–15.5)
WBC: 7.6 10*3/uL (ref 4.0–10.5)

## 2017-01-26 LAB — SURGICAL PCR SCREEN
MRSA, PCR: NEGATIVE
Staphylococcus aureus: NEGATIVE

## 2017-01-26 LAB — BASIC METABOLIC PANEL
ANION GAP: 7 (ref 5–15)
BUN: 14 mg/dL (ref 6–20)
CO2: 25 mmol/L (ref 22–32)
Calcium: 8.9 mg/dL (ref 8.9–10.3)
Chloride: 106 mmol/L (ref 101–111)
Creatinine, Ser: 0.59 mg/dL (ref 0.44–1.00)
GFR calc Af Amer: >60 mL/min (ref >60)
Glucose, Bld: 107 mg/dL — ABNORMAL HIGH (ref 65–99)
Potassium: 4.1 mmol/L (ref 3.5–5.1)
SODIUM: 138 mmol/L (ref 135–145)

## 2017-01-26 LAB — HIV ANTIBODY (ROUTINE TESTING W REFLEX): HIV Screen 4th Generation wRfx: NONREACTIVE

## 2017-01-26 SURGERY — FIXATION, FEMUR, NECK, PERCUTANEOUS, USING SCREW
Anesthesia: General | Site: Hip | Laterality: Right

## 2017-01-26 MED ORDER — LIDOCAINE HCL (CARDIAC) 20 MG/ML IV SOLN
INTRAVENOUS | Status: DC | PRN
  Administered 2017-01-26: 60 mg via INTRAVENOUS

## 2017-01-26 MED ORDER — ACETAMINOPHEN 650 MG RE SUPP: 650.0000 mg | Freq: Four times a day (QID) | RECTAL | Status: DC | PRN

## 2017-01-26 MED ORDER — MIDAZOLAM HCL 2 MG/2ML IJ SOLN
INTRAMUSCULAR | Status: AC
  Administered 2017-01-26: 1 mg via INTRAVENOUS
  Filled 2017-01-26: qty 2

## 2017-01-26 MED ORDER — PHENYLEPHRINE HCL 10 MG/ML IJ SOLN
INTRAMUSCULAR | Status: DC | PRN
  Administered 2017-01-26 (×3): 80 ug via INTRAVENOUS
  Administered 2017-01-26: 40 ug via INTRAVENOUS
  Administered 2017-01-26: 80 ug via INTRAVENOUS

## 2017-01-26 MED ORDER — PHENOL 1.4 % MT LIQD
1.0000 | OROMUCOSAL | Status: DC | PRN
Start: 2017-01-26 — End: 2017-01-29

## 2017-01-26 MED ORDER — MENTHOL 3 MG MT LOZG
1.0000 | LOZENGE | OROMUCOSAL | Status: DC | PRN
  Administered 2017-01-28: 3 mg via ORAL
  Filled 2017-01-26: qty 9

## 2017-01-26 MED ORDER — FENTANYL CITRATE (PF) 250 MCG/5ML IJ SOLN
INTRAMUSCULAR | Status: AC
  Filled 2017-01-26: qty 5

## 2017-01-26 MED ORDER — FENTANYL CITRATE (PF) 100 MCG/2ML IJ SOLN
INTRAMUSCULAR | Status: DC | PRN
  Administered 2017-01-26: 100 ug via INTRAVENOUS
  Administered 2017-01-26 (×2): 50 ug via INTRAVENOUS

## 2017-01-26 MED ORDER — SUCCINYLCHOLINE CHLORIDE 20 MG/ML IJ SOLN
INTRAMUSCULAR | Status: DC | PRN
  Administered 2017-01-26: 60 mg via INTRAVENOUS

## 2017-01-26 MED ORDER — OXYCODONE HCL 5 MG/5ML PO SOLN: 5.0000 mg | Freq: Once | ORAL | Status: DC | PRN

## 2017-01-26 MED ORDER — ACETAMINOPHEN 325 MG PO TABS: 650.0000 mg | ORAL_TABLET | Freq: Four times a day (QID) | ORAL | Status: DC | PRN

## 2017-01-26 MED ORDER — DEXAMETHASONE SODIUM PHOSPHATE 10 MG/ML IJ SOLN
INTRAMUSCULAR | Status: AC
  Filled 2017-01-26: qty 1

## 2017-01-26 MED ORDER — OXYCODONE HCL 5 MG PO TABS
5.0000 mg | ORAL_TABLET | ORAL | Status: DC | PRN
  Administered 2017-01-27 (×3): 5 mg via ORAL
  Administered 2017-01-27 – 2017-01-28 (×3): 10 mg via ORAL
  Administered 2017-01-28: 5 mg via ORAL
  Administered 2017-01-28 – 2017-01-29 (×4): 10 mg via ORAL
  Filled 2017-01-26 (×2): qty 1
  Filled 2017-01-26: qty 2
  Filled 2017-01-26: qty 1
  Filled 2017-01-26 (×3): qty 2
  Filled 2017-01-26: qty 3
  Filled 2017-01-26: qty 1
  Filled 2017-01-26 (×2): qty 2

## 2017-01-26 MED ORDER — EPHEDRINE SULFATE 50 MG/ML IJ SOLN
INTRAMUSCULAR | Status: DC | PRN
  Administered 2017-01-26 (×5): 10 mg via INTRAVENOUS

## 2017-01-26 MED ORDER — OXYCODONE HCL 5 MG PO TABS
5.0000 mg | ORAL_TABLET | ORAL | Status: DC | PRN
  Filled 2017-01-26: qty 1

## 2017-01-26 MED ORDER — HYDROMORPHONE HCL 1 MG/ML IJ SOLN
INTRAMUSCULAR | Status: AC
  Administered 2017-01-26: 0.5 mg via INTRAVENOUS
  Filled 2017-01-26: qty 2

## 2017-01-26 MED ORDER — MIDAZOLAM HCL 2 MG/2ML IJ SOLN
INTRAMUSCULAR | Status: AC
  Filled 2017-01-26: qty 2

## 2017-01-26 MED ORDER — CEFAZOLIN SODIUM-DEXTROSE 2-4 GM/100ML-% IV SOLN
2.0000 g | Freq: Four times a day (QID) | INTRAVENOUS | Status: AC
  Administered 2017-01-27 (×2): 2 g via INTRAVENOUS
  Filled 2017-01-26 (×2): qty 100

## 2017-01-26 MED ORDER — 0.9 % SODIUM CHLORIDE (POUR BTL) OPTIME
TOPICAL | Status: DC | PRN
  Administered 2017-01-26: 1000 mL

## 2017-01-26 MED ORDER — SUCCINYLCHOLINE CHLORIDE 200 MG/10ML IV SOSY
PREFILLED_SYRINGE | INTRAVENOUS | Status: AC
  Filled 2017-01-26: qty 10

## 2017-01-26 MED ORDER — PHENYLEPHRINE 40 MCG/ML (10ML) SYRINGE FOR IV PUSH (FOR BLOOD PRESSURE SUPPORT)
PREFILLED_SYRINGE | INTRAVENOUS | Status: AC
  Filled 2017-01-26: qty 30

## 2017-01-26 MED ORDER — MIDAZOLAM HCL 2 MG/2ML IJ SOLN
1.0000 mg | Freq: Once | INTRAMUSCULAR | Status: AC
  Administered 2017-01-26: 1 mg via INTRAVENOUS

## 2017-01-26 MED ORDER — KETOROLAC TROMETHAMINE 15 MG/ML IJ SOLN: 15.0000 mg | Freq: Once | INTRAMUSCULAR | Status: DC

## 2017-01-26 MED ORDER — CEFAZOLIN SODIUM 1 G IJ SOLR
INTRAMUSCULAR | Status: AC
  Filled 2017-01-26: qty 20

## 2017-01-26 MED ORDER — OXYCODONE HCL 5 MG PO TABS
ORAL_TABLET | ORAL | Status: AC
  Filled 2017-01-26: qty 3

## 2017-01-26 MED ORDER — METOCLOPRAMIDE HCL 5 MG PO TABS: 5.0000 mg | ORAL_TABLET | Freq: Three times a day (TID) | ORAL | Status: DC | PRN

## 2017-01-26 MED ORDER — ACETAMINOPHEN 500 MG PO TABS
500.0000 mg | ORAL_TABLET | Freq: Three times a day (TID) | ORAL | Status: DC
  Administered 2017-01-26: 500 mg via ORAL
  Filled 2017-01-26: qty 1

## 2017-01-26 MED ORDER — BUPIVACAINE HCL (PF) 0.25 % IJ SOLN
INTRAMUSCULAR | Status: DC | PRN
  Administered 2017-01-26: 10 mL

## 2017-01-26 MED ORDER — METHOCARBAMOL 1000 MG/10ML IJ SOLN: 500.0000 mg | Freq: Four times a day (QID) | INTRAMUSCULAR | Status: DC | PRN

## 2017-01-26 MED ORDER — METOCLOPRAMIDE HCL 5 MG/ML IJ SOLN: 5.0000 mg | Freq: Three times a day (TID) | INTRAMUSCULAR | Status: DC | PRN

## 2017-01-26 MED ORDER — LIDOCAINE 2% (20 MG/ML) 5 ML SYRINGE
INTRAMUSCULAR | Status: AC
  Filled 2017-01-26: qty 15

## 2017-01-26 MED ORDER — PANTOPRAZOLE SODIUM 40 MG PO TBEC
40.0000 mg | DELAYED_RELEASE_TABLET | Freq: Every day | ORAL | Status: DC
  Administered 2017-01-27 – 2017-01-29 (×3): 40 mg via ORAL
  Filled 2017-01-26 (×3): qty 1

## 2017-01-26 MED ORDER — SENNA 8.6 MG PO TABS: 1.0000 | ORAL_TABLET | Freq: Two times a day (BID) | ORAL | Status: DC

## 2017-01-26 MED ORDER — METHOCARBAMOL 500 MG PO TABS
500.0000 mg | ORAL_TABLET | Freq: Four times a day (QID) | ORAL | Status: DC | PRN
  Administered 2017-01-28 – 2017-01-29 (×4): 500 mg via ORAL
  Filled 2017-01-26 (×4): qty 1

## 2017-01-26 MED ORDER — DOCUSATE SODIUM 100 MG PO CAPS
100.0000 mg | ORAL_CAPSULE | Freq: Two times a day (BID) | ORAL | Status: DC
  Administered 2017-01-26 – 2017-01-29 (×6): 100 mg via ORAL
  Filled 2017-01-26 (×6): qty 1

## 2017-01-26 MED ORDER — ONDANSETRON HCL 4 MG/2ML IJ SOLN
INTRAMUSCULAR | Status: AC
  Filled 2017-01-26: qty 2

## 2017-01-26 MED ORDER — ONDANSETRON HCL 4 MG/2ML IJ SOLN
4.0000 mg | Freq: Four times a day (QID) | INTRAMUSCULAR | Status: DC | PRN
  Administered 2017-01-27 – 2017-01-29 (×7): 4 mg via INTRAVENOUS
  Filled 2017-01-26 (×7): qty 2

## 2017-01-26 MED ORDER — ASPIRIN EC 325 MG PO TBEC
325.0000 mg | DELAYED_RELEASE_TABLET | Freq: Every day | ORAL | Status: DC
  Administered 2017-01-27: 325 mg via ORAL
  Filled 2017-01-26: qty 1

## 2017-01-26 MED ORDER — EPHEDRINE 5 MG/ML INJ
INTRAVENOUS | Status: AC
  Filled 2017-01-26: qty 10

## 2017-01-26 MED ORDER — CEFAZOLIN SODIUM 1 G IJ SOLR
INTRAMUSCULAR | Status: DC | PRN
  Administered 2017-01-26: 2 g via INTRAMUSCULAR

## 2017-01-26 MED ORDER — BUPIVACAINE HCL (PF) 0.25 % IJ SOLN
INTRAMUSCULAR | Status: AC
  Filled 2017-01-26: qty 30

## 2017-01-26 MED ORDER — ONDANSETRON HCL 4 MG PO TABS: 4.0000 mg | ORAL_TABLET | Freq: Four times a day (QID) | ORAL | Status: DC | PRN

## 2017-01-26 MED ORDER — OXYCODONE HCL 5 MG PO TABS: 5.0000 mg | ORAL_TABLET | Freq: Once | ORAL | Status: DC | PRN

## 2017-01-26 MED ORDER — LACTATED RINGERS IV SOLN
INTRAVENOUS | Status: DC
  Administered 2017-01-26 (×3): via INTRAVENOUS

## 2017-01-26 MED ORDER — SENNOSIDES-DOCUSATE SODIUM 8.6-50 MG PO TABS: 1.0000 | ORAL_TABLET | Freq: Two times a day (BID) | ORAL | Status: DC

## 2017-01-26 MED ORDER — SODIUM CHLORIDE 0.9 % IV SOLN
INTRAVENOUS | Status: DC
  Administered 2017-01-26: 21:00:00 via INTRAVENOUS

## 2017-01-26 MED ORDER — HYDROMORPHONE HCL 1 MG/ML IJ SOLN
0.2500 mg | INTRAMUSCULAR | Status: DC | PRN
  Administered 2017-01-26 (×3): 0.5 mg via INTRAVENOUS

## 2017-01-26 MED ORDER — MORPHINE SULFATE (PF) 4 MG/ML IV SOLN
0.5000 mg | INTRAVENOUS | Status: DC | PRN
  Administered 2017-01-27 – 2017-01-29 (×5): 0.52 mg via INTRAVENOUS
  Filled 2017-01-26 (×6): qty 1

## 2017-01-26 MED ORDER — ONDANSETRON HCL 4 MG/2ML IJ SOLN
INTRAMUSCULAR | Status: DC | PRN
  Administered 2017-01-26: 4 mg via INTRAVENOUS

## 2017-01-26 MED ORDER — HYDROCODONE-ACETAMINOPHEN 5-325 MG PO TABS: 1.0000 | ORAL_TABLET | Freq: Four times a day (QID) | ORAL | Status: DC | PRN

## 2017-01-26 MED ORDER — HYDROMORPHONE HCL 1 MG/ML IJ SOLN: 0.5000 mg | INTRAMUSCULAR | Status: DC | PRN

## 2017-01-26 MED ORDER — MIDAZOLAM HCL 5 MG/5ML IJ SOLN
INTRAMUSCULAR | Status: DC | PRN
  Administered 2017-01-26: 2 mg via INTRAVENOUS

## 2017-01-26 MED ORDER — DIPHENHYDRAMINE HCL 50 MG/ML IJ SOLN: 25.0000 mg | Freq: Four times a day (QID) | INTRAMUSCULAR | Status: DC | PRN

## 2017-01-26 MED ORDER — ACETAMINOPHEN 500 MG PO TABS
1000.0000 mg | ORAL_TABLET | Freq: Three times a day (TID) | ORAL | Status: DC
  Administered 2017-01-26 – 2017-01-27 (×3): 1000 mg via ORAL
  Administered 2017-01-28: 500 mg via ORAL
  Administered 2017-01-28 – 2017-01-29 (×2): 1000 mg via ORAL
  Filled 2017-01-26 (×6): qty 2

## 2017-01-26 MED ORDER — PROPOFOL 10 MG/ML IV BOLUS
INTRAVENOUS | Status: DC | PRN
  Administered 2017-01-26: 100 mg via INTRAVENOUS
  Administered 2017-01-26: 40 mg via INTRAVENOUS

## 2017-01-26 MED ORDER — KETOROLAC TROMETHAMINE 15 MG/ML IJ SOLN
INTRAMUSCULAR | Status: AC
  Filled 2017-01-26: qty 1

## 2017-01-26 MED ORDER — DEXAMETHASONE SODIUM PHOSPHATE 4 MG/ML IJ SOLN
INTRAMUSCULAR | Status: DC | PRN
  Administered 2017-01-26: 10 mg via INTRAVENOUS

## 2017-01-26 SURGICAL SUPPLY — 40 items
APL SKNCLS STERI-STRIP NONHPOA (GAUZE/BANDAGES/DRESSINGS) ×1
BENZOIN TINCTURE PRP APPL 2/3 (GAUZE/BANDAGES/DRESSINGS) ×2 IMPLANT
BIT DRILL 4.8X300 (BIT) ×2 IMPLANT
CLSR STERI-STRIP ANTIMIC 1/2X4 (GAUZE/BANDAGES/DRESSINGS) ×2 IMPLANT
COVER PERINEAL POST (MISCELLANEOUS) ×2 IMPLANT
COVER SURGICAL LIGHT HANDLE (MISCELLANEOUS) ×2 IMPLANT
DRAPE STERI IOBAN 125X83 (DRAPES) ×2 IMPLANT
DRSG AQUACEL AG ADV 3.5X 4 (GAUZE/BANDAGES/DRESSINGS) ×2 IMPLANT
DRSG AQUACEL AG ADV 3.5X10 (GAUZE/BANDAGES/DRESSINGS) ×2 IMPLANT
DRSG MEPILEX BORDER 4X4 (GAUZE/BANDAGES/DRESSINGS) ×2 IMPLANT
DURAPREP 26ML APPLICATOR (WOUND CARE) ×2 IMPLANT
ELECT REM PT RETURN 9FT ADLT (ELECTROSURGICAL) ×2
ELECTRODE REM PT RTRN 9FT ADLT (ELECTROSURGICAL) ×1 IMPLANT
GLOVE BIO SURGEON STRL SZ8 (GLOVE) ×2 IMPLANT
GLOVE BIOGEL PI IND STRL 8 (GLOVE) ×1 IMPLANT
GLOVE BIOGEL PI INDICATOR 8 (GLOVE) ×1
GLOVE ORTHO TXT STRL SZ7.5 (GLOVE) ×2 IMPLANT
GOWN STRL REUS W/ TWL LRG LVL3 (GOWN DISPOSABLE) ×1 IMPLANT
GOWN STRL REUS W/ TWL XL LVL3 (GOWN DISPOSABLE) ×2 IMPLANT
GOWN STRL REUS W/TWL LRG LVL3 (GOWN DISPOSABLE) ×2
GOWN STRL REUS W/TWL XL LVL3 (GOWN DISPOSABLE) ×4
KIT ROOM TURNOVER OR (KITS) ×2 IMPLANT
LINER BOOT UNIVERSAL DISP (MISCELLANEOUS) ×4 IMPLANT
MANIFOLD NEPTUNE II (INSTRUMENTS) IMPLANT
NS IRRIG 1000ML POUR BTL (IV SOLUTION) ×2 IMPLANT
PACK GENERAL/GYN (CUSTOM PROCEDURE TRAY) ×2 IMPLANT
PAD ARMBOARD 7.5X6 YLW CONV (MISCELLANEOUS) ×4 IMPLANT
PIN GUIDE DRILL TIP 2.8X300 (DRILL) ×3 IMPLANT
SCREW 16MM THREAD 6.5X85MM (Screw) ×2 IMPLANT
SCREW CANN THRD 6.5X80 (Screw) ×4 IMPLANT
STAPLER VISISTAT 35W (STAPLE) IMPLANT
SUT VIC AB 0 CT1 27 (SUTURE) ×4
SUT VIC AB 0 CT1 27XBRD ANBCTR (SUTURE) ×2 IMPLANT
SUT VIC AB 1 CT1 27 (SUTURE) ×2
SUT VIC AB 1 CT1 27XBRD ANBCTR (SUTURE) ×1 IMPLANT
SUT VIC AB 2-0 CT1 27 (SUTURE)
SUT VIC AB 2-0 CT1 TAPERPNT 27 (SUTURE) IMPLANT
TOWEL OR 17X24 6PK STRL BLUE (TOWEL DISPOSABLE) ×2 IMPLANT
TOWEL OR 17X26 10 PK STRL BLUE (TOWEL DISPOSABLE) ×2 IMPLANT
WATER STERILE IRR 1000ML POUR (IV SOLUTION) ×2 IMPLANT

## 2017-01-26 NOTE — Anesthesia Preprocedure Evaluation (Signed)
Anesthesia Evaluation  Patient identified by MRN, date of birth, ID band Patient awake    Reviewed: Allergy & Precautions, NPO status , Patient's Chart, lab work & pertinent test results  History of Anesthesia Complications (+) PONV and history of anesthetic complications  Airway Mallampati: III  TM Distance: <3 FB Neck ROM: Full    Dental  (+) Teeth Intact   Pulmonary sleep apnea ,    breath sounds clear to auscultation       Cardiovascular negative cardio ROS   Rhythm:Regular     Neuro/Psych  Headaches, Anxiety Occular Myasthenia  Neuromuscular disease    GI/Hepatic Neg liver ROS, GERD  Controlled,  Endo/Other  negative endocrine ROS  Renal/GU      Musculoskeletal  (+) Arthritis , Fibromyalgia -Right hipp fx   Abdominal   Peds  Hematology   Anesthesia Other Findings   Reproductive/Obstetrics                            Anesthesia Physical Anesthesia Plan  ASA: II  Anesthesia Plan: General   Post-op Pain Management:    Induction: Intravenous  Airway Management Planned: Oral ETT  Additional Equipment: None  Intra-op Plan:   Post-operative Plan: Extubation in OR  Informed Consent: I have reviewed the patients History and Physical, chart, labs and discussed the procedure including the risks, benefits and alternatives for the proposed anesthesia with the patient or authorized representative who has indicated his/her understanding and acceptance.   Dental advisory given  Plan Discussed with: CRNA and Surgeon  Anesthesia Plan Comments:         Anesthesia Quick Evaluation

## 2017-01-26 NOTE — Op Note (Signed)
NAMESAVILLA, TURBYFILL NO.:  [id number]  MEDICAL RECORD NO.:  [id number]  LOCATION:  5N31C                        FACILITY:  [facility]  PHYSICIAN:  Lind Guest. Ninfa Linden, M.D.DATE OF BIRTH:  02/24/1956  DATE OF PROCEDURE:  01/26/2017 DATE OF DISCHARGE:                              OPERATIVE REPORT   PREOPERATIVE DIAGNOSIS:  Nondisplaced right hip femoral neck fracture.  POSTOPERATIVE DIAGNOSIS:  Nondisplaced right hip femoral neck fracture.  PROCEDURE:  Cannulated screw fixation, right hip, femoral neck fracture.  IMPLANTS:  Biomet titanium 6.5 mm cannulated screws x3.  SURGEON:  Lind Guest. Ninfa Linden, M.D.  ANESTHESIA: 1. General. 2. Local with 0.25% plain Marcaine.  BLOOD LOSS:  Less than 100 mL.  COMPLICATIONS:  None.  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  She was visiting her son's graduation from medical school in [place] this weekend on Sunday, when she sustained a mechanical fall, injuring her right hip. X-ray showed a nondisplaced femoral neck fracture.  She sought the definitive treatment in [place] since this is where she is from and worked and she was transferred back down here to [facility]. She requested me for surgery.  We obtained new x-rays and confirmed that she has a nondisplaced femoral neck fracture of the right hip.  I talked about fixing this with cannulated screws with a down side her needing to be nonweightbearing to 50% weightbearing for 4 to 6 weeks before full weightbearing.  She understands if the fracture is displaced enough that we would need a total hip replacement given her age of 10 and she does give consent for this as well.  We had a long discussion of the risks and benefits of surgery and she understands cannulated screw fixation may still end up  with her needing a hip replacement if she develops osteonecrosis to the femoral neck fracture itself.  PROCEDURE DESCRIPTION:  After informed consent was obtained, appropriate right hip was marked.  She was brought to the operating room where general anesthesia was obtained while she was on her stretcher.  She was then placed supine on the operating table with the perineal post in place and both legs in traction devices, but no traction applied.  We then assessed her fracture under direct fluoroscopy and I put her through internal and external rotation and I removed it as a unit and remained nondisplaced.  We then prepped the right hip with DuraPrep and sterile drapes.  Time-out was called, she was identified as correct patient and correct right hip.  I then made an incision over the lateral femur, this was a small incision, dissected down to the iliotibial band which we divided longitudinally.  I then under direct fluoroscopy placed 3 temporary cannulated screws in an inverted triangle format, securing the fracture and traversing the fracture into the femoral head and neck, verified in all planes under fluoroscopy.  I took measurements off these and then used a drill to open up the near cortex.  I then placed 3 cannulated screws without difficulty and tightened these down, removed  the temporary guide pins.  I then again put the hip through internal and external rotation and moved it as a unit and I felt good about the fracture.  We irrigated the soft tissue with normal saline solution and then closed the deep tissue with 0 Vicryl followed by 2-0 Vicryl in the subcutaneous tissue, 4-0 Monocryl subcuticular stitch, and Steri-Strips on the skin.  An Aquacel dressing was applied.  She was taken off the operating table, awakened, extubated, and taken to the recovery room in stable condition.  All final counts were correct.  There were no complications noted.     Lind Guest. Ninfa Linden,  M.D.     CYB/MEDQ  D:  01/26/2017  T:  01/26/2017  Job:  [id number]

## 2017-01-26 NOTE — Brief Op Note (Signed)
01/25/2017 - 01/26/2017  7:16 PM  PATIENT:  Felicia Cain  61 y.o. female  PRE-OPERATIVE DIAGNOSIS:  RIGHT HIP FRACTURE  POST-OPERATIVE DIAGNOSIS:  RIGHT HIP FRACTURE  PROCEDURE:  Procedure(s): CANNULATED SCREWS/RIGHT HIP PINNING (Right)  SURGEON:  Surgeon(s) and Role:    * Mcarthur Rossetti, MD - Primary  ANESTHESIA:   local and general  EBL:  < 100 cc  LOCAL MEDICATIONS USED:  MARCAINE     COUNTS:  YES  DICTATION: .Other Dictation: Dictation Number [id number]  PLAN OF CARE: Admit to inpatient   PATIENT DISPOSITION:  PACU - hemodynamically stable.   Delay start of Pharmacological VTE agent (>24hrs) due to surgical blood loss or risk of bleeding: no

## 2017-01-26 NOTE — Progress Notes (Signed)
PT Cancellation Note  Patient Details Name: Felicia Cain MRN: [id number] DOB: 1956-02-04   Cancelled Treatment:    Reason Eval/Treat Not Completed: Other (comment) (Pt scheduled for R ORIF this pm, currently on bed rest order) PT will evaluate patient on 5/23 s/p surgery. Thanks  Benjamine Mola B. Migdalia Dk PT, DPT Acute Rehabilitation  [phone] Pager [phone]   [place] 01/26/2017, 9:48 AM

## 2017-01-26 NOTE — Progress Notes (Addendum)
PROGRESS NOTE    Felicia Cain  VVZ:[id number] DOB: 1956-08-21 DOA: 01/25/2017 PCP: Lanice Shirts, MD    Brief Narrative:  Patient is a pleasant 61 year old female history of depression/anxiety, DCIS right breast status post mastectomy, fibromyalgia, esophageal stricture, optic neuritis, who presents 24 hours after full and striking the right hip on the pavement while at her son's graduation. Patient with complaints of right hip pain. Plain films done consistent with a fracture.Orthopedics consulted and patient for hip repair this afternoon 01/26/2017.:   Assessment & Plan    Principal Problem:   Closed hip fracture, right, initial encounter [facility]) Active Problems:   GERD (gastroesophageal reflux disease)   Hip fracture ([place])   Preop examination   Fibromyalgia   Neuropathy   Corneal abrasion   Nausea   Fall  #1 right closed hip fracture Secondary to mechanical fall. Patient denied any syncopal episode. No chest pain. Patient has been assessed by orthopedics and patient to the operating room today for hip repair. Per Dr. Ninfa Linden of orthopedics.  #2 ischemic optic neuritis/loss of right peripheral vision Continue home regimen of aspirin. Outpatient follow-up.  #3 corneal abrasion Continue home regimen doxycycline.  #4 fibromyalgia/neuropathy Stable. Continue Neurontin.  #5 gastroesophageal reflux disease PPI.    DVT prophylaxis: Postoperatively per orthopedics. Code Status: Full Family Communication: Updated patient. No family at bedside. Disposition Plan: Likely skilled nursing facility versus home with home health pending hip repair and per orthopedics.   Consultants:   Orthopedics: Dr. Ninfa Linden 01/25/2017  Procedures:   Chest x-ray 01/25/2017  Plain films of the pelvis 01/25/2017  Antimicrobials:   None   Subjective: Patient complaining of some right hip pain. No chest pain. No shortness of breath.  Objective: Vitals:   01/25/17 1259  01/25/17 2249 01/26/17 0606  BP: 120/63 (!) 118/58 121/73  Pulse: 78 77 77  Resp: 18 18 18   Temp: 98.4 F (36.9 C) 98 F (36.7 C) 97.6 F (36.4 C)  TempSrc: Oral Oral Oral  SpO2: 100% 100% 100%    Intake/Output Summary (Last 24 hours) at 01/26/17 1613 Last data filed at 01/25/17 1800  Gross per 24 hour  Intake           563.75 ml  Output                0 ml  Net           563.75 ml   There were no vitals filed for this visit.  Examination:  General exam: Appears calm and comfortable  Respiratory system: Clear to auscultation. Respiratory effort normal. Cardiovascular system: S1 & S2 heard, RRR. No JVD, murmurs, rubs, gallops or clicks. No pedal edema. Gastrointestinal system: Abdomen is nondistended, soft and nontender. No organomegaly or masses felt. Normal bowel sounds heard. Central nervous system: Alert and oriented. No focal neurological deficits. Extremities: Symmetric 5 x 5 power. Right lower extremity slightly shortened and externally rotated. Right hip tender to palpation. Skin: No rashes, lesions or ulcers Psychiatry: Judgement and insight appear normal. Mood & affect appropriate.     Data Reviewed: I have personally reviewed following labs and imaging studies  CBC:  Recent Labs Lab 01/25/17 1419 01/26/17 0650  WBC 8.8 7.6  HGB 12.4 12.7  HCT 37.3 37.5  MCV 91.0 90.8  PLT 245 544   Basic Metabolic Panel:  Recent Labs Lab 01/25/17 1419 01/26/17 0650  NA 137 138  K 3.6 4.1  CL 106 106  CO2 24 25  GLUCOSE 102*  107*  BUN 21* 14  CREATININE 0.61 0.59  CALCIUM 8.4* 8.9   GFR: CrCl cannot be calculated (Unknown ideal weight.). Liver Function Tests: No results for input(s): AST, ALT, ALKPHOS, BILITOT, PROT, ALBUMIN in the last 168 hours. No results for input(s): LIPASE, AMYLASE in the last 168 hours. No results for input(s): AMMONIA in the last 168 hours. Coagulation Profile:  Recent Labs Lab 01/25/17 1419  INR 1.05   Cardiac Enzymes: No  results for input(s): CKTOTAL, CKMB, CKMBINDEX, TROPONINI in the last 168 hours. BNP (last 3 results) No results for input(s): PROBNP in the last 8760 hours. HbA1C: No results for input(s): HGBA1C in the last 72 hours. CBG: No results for input(s): GLUCAP in the last 168 hours. Lipid Profile: No results for input(s): CHOL, HDL, LDLCALC, TRIG, CHOLHDL, LDLDIRECT in the last 72 hours. Thyroid Function Tests: No results for input(s): TSH, T4TOTAL, FREET4, T3FREE, THYROIDAB in the last 72 hours. Anemia Panel: No results for input(s): VITAMINB12, FOLATE, FERRITIN, TIBC, IRON, RETICCTPCT in the last 72 hours. Sepsis Labs: No results for input(s): PROCALCITON, LATICACIDVEN in the last 168 hours.  Recent Results (from the past 240 hour(s))  Surgical pcr screen     Status: None   Collection Time: 01/26/17  7:56 AM  Result Value Ref Range Status   MRSA, PCR NEGATIVE NEGATIVE Final   Staphylococcus aureus NEGATIVE NEGATIVE Final    Comment:        The Xpert SA Assay (FDA approved for NASAL specimens in patients over 47 years of age), is one component of a comprehensive surveillance program.  Test performance has been validated by [facility] for patients greater than or equal to 52 year old. It is not intended to diagnose infection nor to guide or monitor treatment.          Radiology Studies: Dg Pelvis Portable  Result Date: 01/25/2017 CLINICAL DATA:  Hip fracture EXAM: PORTABLE PELVIS 1-2 VIEWS COMPARISON:  None. FINDINGS: Nondisplaced right femoral neck fracture. No other fracture or dislocation. No lytic or sclerotic osseous lesion. IMPRESSION: Nondisplaced right femoral neck fracture. Electronically Signed   By: Kathreen Devoid   On: 01/25/2017 14:33   Dg Chest Port 1 View  Result Date: 01/25/2017 CLINICAL DATA:  Preop for hip fracture. EXAM: PORTABLE CHEST 1 VIEW COMPARISON:  Radiographs of July 19, 2015. FINDINGS: The heart size and mediastinal contours are within normal  limits. Both lungs are clear. No pneumothorax or pleural effusion is noted. The visualized skeletal structures are unremarkable. IMPRESSION: No acute cardiopulmonary abnormality seen. Electronically Signed   By: Marijo Conception, M.D.   On: 01/25/2017 14:31        Scheduled Meds: . acetaminophen  1,000 mg Oral TID  . docusate sodium  100 mg Oral BID  . doxycycline  100 mg Oral Daily  . gabapentin  300 mg Oral Q breakfast   Continuous Infusions: . methocarbamol (ROBAXIN)  IV Stopped (01/26/17 0811)     LOS: 1 day    Time spent: 71 minutes    Rowena Moilanen, MD [facility] Pager [phone]  If 7PM-7AM, please contact night-coverage [email or web address] Password [facility] 01/26/2017, 4:13 PM

## 2017-01-26 NOTE — Progress Notes (Signed)
Patient ID: Felicia Cain, female   DOB: 09-11-1955, 61 y.o.   MRN: [id number] She understands she is having surgery today for cannulated fixation of her right hip femoral neck fracture.  Risks and benefits have been discussed and informed obtained.  All questions encouraged and answered.

## 2017-01-26 NOTE — Anesthesia Procedure Notes (Signed)
Procedure Name: Intubation Date/Time: 01/26/2017 5:42 PM Performed by: Ollen Bowl Pre-anesthesia Checklist: Patient identified, Emergency Drugs available, Suction available, Patient being monitored and Timeout performed Patient Re-evaluated:Patient Re-evaluated prior to inductionOxygen Delivery Method: Circle system utilized and Simple face mask Preoxygenation: Pre-oxygenation with 100% oxygen Intubation Type: IV induction Ventilation: Mask ventilation without difficulty Laryngoscope Size: Mac and 3 Grade View: Grade II Tube type: Oral Tube size: 7.5 mm Number of attempts: 1 Airway Equipment and Method: Patient positioned with wedge pillow and Stylet Placement Confirmation: ETT inserted through vocal cords under direct vision,  positive ETCO2 and breath sounds checked- equal and bilateral Secured at: 21 cm Tube secured with: Tape Dental Injury: Teeth and Oropharynx as per pre-operative assessment

## 2017-01-26 NOTE — Transfer of Care (Signed)
Immediate Anesthesia Transfer of Care Note  Patient: Felicia Cain  Procedure(s) Performed: Procedure(s): CANNULATED SCREWS/RIGHT HIP PINNING (Right)  Patient Location: PACU  Anesthesia Type:General  Level of Consciousness: awake  Airway & Oxygen Therapy: Patient Spontanous Breathing  Post-op Assessment: Report given to RN and Post -op Vital signs reviewed and stable  Post vital signs: Reviewed and stable  Last Vitals:  Vitals:   01/26/17 0606 01/26/17 1903  BP: 121/73 117/72  Pulse: 77 (!) 112  Resp: 18 20  Temp: 36.4 C 36.2 C    Last Pain:  Vitals:   01/26/17 1903  TempSrc:   PainSc: 6       Patients Stated Pain Goal: 0 (36/68/15 9470)  Complications: No apparent anesthesia complications

## 2017-01-27 ENCOUNTER — Encounter (HOSPITAL_COMMUNITY): Payer: Self-pay | Admitting: Orthopaedic Surgery

## 2017-01-27 DIAGNOSIS — K219 Gastro-esophageal reflux disease without esophagitis: Secondary | ICD-10-CM

## 2017-01-27 DIAGNOSIS — D649 Anemia, unspecified: Secondary | ICD-10-CM

## 2017-01-27 DIAGNOSIS — S0500XD Injury of conjunctiva and corneal abrasion without foreign body, unspecified eye, subsequent encounter: Secondary | ICD-10-CM

## 2017-01-27 LAB — BASIC METABOLIC PANEL
ANION GAP: 7 (ref 5–15)
BUN: 10 mg/dL (ref 6–20)
CALCIUM: 9.1 mg/dL (ref 8.9–10.3)
CO2: 27 mmol/L (ref 22–32)
Chloride: 103 mmol/L (ref 101–111)
Creatinine, Ser: 0.69 mg/dL (ref 0.44–1.00)
GFR calc Af Amer: >60 mL/min (ref >60)
GFR calc non Af Amer: >60 mL/min (ref >60)
GLUCOSE: 132 mg/dL — AB (ref 65–99)
POTASSIUM: 3.9 mmol/L (ref 3.5–5.1)
Sodium: 137 mmol/L (ref 135–145)

## 2017-01-27 LAB — CBC WITH DIFFERENTIAL/PLATELET
BASOS ABS: 0 10*3/uL (ref 0.0–0.1)
Basophils Relative: 0 %
Eosinophils Absolute: 0 10*3/uL (ref 0.0–0.7)
Eosinophils Relative: 0 %
HEMATOCRIT: 32.7 % — AB (ref 36.0–46.0)
Hemoglobin: 10.7 g/dL — ABNORMAL LOW (ref 12.0–15.0)
LYMPHS ABS: 0.9 10*3/uL (ref 0.7–4.0)
LYMPHS PCT: 9 %
MCH: 29.6 pg (ref 26.0–34.0)
MCHC: 32.7 g/dL (ref 30.0–36.0)
MCV: 90.6 fL (ref 78.0–100.0)
MONO ABS: 1 10*3/uL (ref 0.1–1.0)
Monocytes Relative: 10 %
NEUTROS ABS: 8.4 10*3/uL — AB (ref 1.7–7.7)
Neutrophils Relative %: 81 %
Platelets: 257 10*3/uL (ref 150–400)
RBC: 3.61 MIL/uL — ABNORMAL LOW (ref 3.87–5.11)
RDW: 12.8 % (ref 11.5–15.5)
WBC: 10.4 10*3/uL (ref 4.0–10.5)

## 2017-01-27 MED ORDER — ASPIRIN EC 81 MG PO TBEC: 81.0000 mg | DELAYED_RELEASE_TABLET | Freq: Every day | ORAL | Status: DC

## 2017-01-27 MED ORDER — ASPIRIN EC 81 MG PO TBEC
81.0000 mg | DELAYED_RELEASE_TABLET | Freq: Every day | ORAL | Status: DC
  Administered 2017-01-28: 81 mg via ORAL
  Filled 2017-01-27: qty 1

## 2017-01-27 MED ORDER — ENOXAPARIN SODIUM 40 MG/0.4ML ~~LOC~~ SOLN
40.0000 mg | SUBCUTANEOUS | Status: DC
  Administered 2017-01-27: 40 mg via SUBCUTANEOUS
  Filled 2017-01-27 (×2): qty 0.4

## 2017-01-27 MED ORDER — SODIUM CHLORIDE 0.9 % IV BOLUS (SEPSIS)
250.0000 mL | Freq: Once | INTRAVENOUS | Status: AC
  Administered 2017-01-27: 250 mL via INTRAVENOUS

## 2017-01-27 MED ORDER — ASPIRIN 81 MG PO CHEW: 81.0000 mg | CHEWABLE_TABLET | Freq: Two times a day (BID) | ORAL | 0 refills | Status: DC

## 2017-01-27 MED ORDER — OXYCODONE HCL 5 MG PO TABS: 5.0000 mg | ORAL_TABLET | ORAL | 0 refills | Status: DC | PRN

## 2017-01-27 MED ORDER — METHOCARBAMOL 500 MG PO TABS: 500.0000 mg | ORAL_TABLET | Freq: Four times a day (QID) | ORAL | 0 refills | Status: DC | PRN

## 2017-01-27 NOTE — Progress Notes (Signed)
PT  Wanted K pad for foot. Text page MD for order. Pt received K pad and is now on pt.

## 2017-01-27 NOTE — Progress Notes (Signed)
Subjective: 1 Day Post-Op Procedure(s) (LRB): CANNULATED SCREWS/RIGHT HIP PINNING (Right) Patient reports pain as moderate.    Objective: Vital signs in last 24 hours: Temp:  [97.2 F (36.2 C)-97.8 F (36.6 C)] 97.8 F (36.6 C) (05/23 0413) Pulse Rate:  [77-112] 77 (05/23 0413) Resp:  [12-24] 18 (05/23 0413) BP: (98-126)/(54-76) 109/54 (05/23 0500) SpO2:  [94 %-100 %] 95 % (05/23 0413)  Intake/Output from previous day: 05/22 0701 - 05/23 0700 In: 1000 [I.V.:1000] Out: 200 [Blood:200] Intake/Output this shift: No intake/output data recorded.   Recent Labs  01/25/17 1419 01/26/17 0650  HGB 12.4 12.7    Recent Labs  01/25/17 1419 01/26/17 0650  WBC 8.8 7.6  RBC 4.10 4.13  HCT 37.3 37.5  PLT 245 243    Recent Labs  01/25/17 1419 01/26/17 0650  NA 137 138  K 3.6 4.1  CL 106 106  CO2 24 25  BUN 21* 14  CREATININE 0.61 0.59  GLUCOSE 102* 107*  CALCIUM 8.4* 8.9    Recent Labs  01/25/17 1419  INR 1.05    Sensation intact distally Intact pulses distally Dorsiflexion/Plantar flexion intact Incision: dressing C/D/I  Assessment/Plan: 1 Day Post-Op Procedure(s) (LRB): CANNULATED SCREWS/RIGHT HIP PINNING (Right) Up with therapy   -  Only up to 50% weight on right hip  Mcarthur Rossetti 01/27/2017, 7:03 AM

## 2017-01-27 NOTE — Discharge Instructions (Signed)
Only limited weight on your right hip. Expect swelling; ice as needed. Pump your feet occasionally throughout the day. Leave your current hip dressing in place. You can get your dressing wet daily in the shower.

## 2017-01-27 NOTE — Progress Notes (Signed)
PROGRESS NOTE    Felicia Cain  QJJ:[id number] DOB: 25-Aug-1956 DOA: 01/25/2017 PCP: Lanice Shirts, MD    Brief Narrative:  Patient is a pleasant 61 year old female history of depression/anxiety, DCIS right breast status post mastectomy, fibromyalgia, esophageal stricture, optic neuritis, who presents 24 hours after full and striking the right hip on the pavement while at her son's graduation. Patient with complaints of right hip pain. Plain films done consistent with a fracture.she went  surgical repair 5/22 by Dr. Ninfa Linden.  Assessment & Plan    Principal Problem:   Closed hip fracture, right, initial encounter [facility]) Active Problems:   GERD (gastroesophageal reflux disease)   Hip fracture (HCC)   Preop examination   Fibromyalgia   Neuropathy   Corneal abrasion   Nausea   Fall  right closed hip fracture - Secondary to make mechanical fall, orthopedic input greatly appreciated, she went for surgical repair with right hip pinning/cannulated screws by Dr. Ninfa Linden on 5/22 . - PT consult pending, continue with when necessary pain and nausea medication , upper therapy, only up to 50% weight on right hip also.  Acute post loss anemia - Mild, postoperative, hemoglobin is 10.7 today, monitor closely, no indication to transfuse.  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 Outpatient follow-up.  corneal abrasion - Continue home regimen doxycycline.  fibromyalgia/neuropathy - Stable. Continue Neurontin.  gastroesophageal reflux disease - PPI.    DVT prophylaxis: Postoperatively per orthopedics. Code Status: Full Family Communication: Updated son, DR Tyrell Antonio at bedside. Disposition Plan: pending PT consult    Consultants:   Orthopedics: Dr. Ninfa Linden 01/25/2017  Procedures:   Status post right hip surgical repairCANNULATED SCREWS/RIGHT HIP PINNING (Right)  by Dr. Ninfa Linden  Antimicrobials:    None   Subjective: Patient complaining of some right hip pain. No chest pain. No shortness of breath.  Objective: Vitals:   01/26/17 2003 01/26/17 2027 01/27/17 0413 01/27/17 0500  BP: 115/76 (!) 117/56 (!) 98/54 (!) 109/54  Pulse: 83 87 77   Resp: 19 18 18    Temp:  97.7 F (36.5 C) 97.8 F (36.6 C)   TempSrc:  Oral Oral   SpO2: 100% 94% 95%     Intake/Output Summary (Last 24 hours) at 01/27/17 1110 Last data filed at 01/26/17 1845  Gross per 24 hour  Intake             1000 ml  Output              200 ml  Net              800 ml   There were no vitals filed for this visit.  Examination:  Awake alert oriented 3, no apparent distress Good air entry bilaterally, clear to auscultation S1, S2, no rubs murmurs gallops, Abdomen soft, nontender, nondistended, bowel sounds present Extremities with no edema, clubbing or cyanosis.    Data Reviewed: I have personally reviewed following labs and imaging studies  CBC:  Recent Labs Lab 01/25/17 1419 01/26/17 0650 01/27/17 0807  WBC 8.8 7.6 10.4  NEUTROABS  --   --  8.4*  HGB 12.4 12.7 10.7*  HCT 37.3 37.5 32.7*  MCV 91.0 90.8 90.6  PLT 245 243 481   Basic Metabolic Panel:  Recent Labs Lab 01/25/17 1419 01/26/17 0650 01/27/17 0807  NA 137 138 137  K 3.6 4.1 3.9  CL 106 106 103  CO2 24 25 27   GLUCOSE 102* 107* 132*  BUN  21* 14 10  CREATININE 0.61 0.59 0.69  CALCIUM 8.4* 8.9 9.1   GFR: CrCl cannot be calculated (Unknown ideal weight.). Liver Function Tests: No results for input(s): AST, ALT, ALKPHOS, BILITOT, PROT, ALBUMIN in the last 168 hours. No results for input(s): LIPASE, AMYLASE in the last 168 hours. No results for input(s): AMMONIA in the last 168 hours. Coagulation Profile:  Recent Labs Lab 01/25/17 1419  INR 1.05   Cardiac Enzymes: No results for input(s): CKTOTAL, CKMB, CKMBINDEX, TROPONINI in the last 168 hours. BNP (last 3 results) No results for input(s): PROBNP in the last 8760  hours. HbA1C: No results for input(s): HGBA1C in the last 72 hours. CBG: No results for input(s): GLUCAP in the last 168 hours. Lipid Profile: No results for input(s): CHOL, HDL, LDLCALC, TRIG, CHOLHDL, LDLDIRECT in the last 72 hours. Thyroid Function Tests: No results for input(s): TSH, T4TOTAL, FREET4, T3FREE, THYROIDAB in the last 72 hours. Anemia Panel: No results for input(s): VITAMINB12, FOLATE, FERRITIN, TIBC, IRON, RETICCTPCT in the last 72 hours. Sepsis Labs: No results for input(s): PROCALCITON, LATICACIDVEN in the last 168 hours.  Recent Results (from the past 240 hour(s))  Surgical pcr screen     Status: None   Collection Time: 01/26/17  7:56 AM  Result Value Ref Range Status   MRSA, PCR NEGATIVE NEGATIVE Final   Staphylococcus aureus NEGATIVE NEGATIVE Final    Comment:        The Xpert SA Assay (FDA approved for NASAL specimens in patients over 32 years of age), is one component of a comprehensive surveillance program.  Test performance has been validated by [facility] for patients greater than or equal to 11 year old. It is not intended to diagnose infection nor to guide or monitor treatment.          Radiology Studies: Dg Pelvis Portable  Result Date: 01/25/2017 CLINICAL DATA:  Hip fracture EXAM: PORTABLE PELVIS 1-2 VIEWS COMPARISON:  None. FINDINGS: Nondisplaced right femoral neck fracture. No other fracture or dislocation. No lytic or sclerotic osseous lesion. IMPRESSION: Nondisplaced right femoral neck fracture. Electronically Signed   By: Kathreen Devoid   On: 01/25/2017 14:33   Dg Chest Port 1 View  Result Date: 01/25/2017 CLINICAL DATA:  Preop for hip fracture. EXAM: PORTABLE CHEST 1 VIEW COMPARISON:  Radiographs of July 19, 2015. FINDINGS: The heart size and mediastinal contours are within normal limits. Both lungs are clear. No pneumothorax or pleural effusion is noted. The visualized skeletal structures are unremarkable. IMPRESSION: No acute  cardiopulmonary abnormality seen. Electronically Signed   By: Marijo Conception, M.D.   On: 01/25/2017 14:31   Dg C-arm 1-60 Min  Result Date: 01/26/2017 CLINICAL DATA:  Status post right hip pinning.  Initial encounter. EXAM: OPERATIVE RIGHT HIP (WITH PELVIS IF PERFORMED) 2 VIEWS TECHNIQUE: Fluoroscopic spot image(s) were submitted for interpretation post-operatively. COMPARISON:  None. FINDINGS: Five fluoroscopic C-arm images are provided from the OR, demonstrating placement of 3 pins across the patient's right femoral neck fracture, transfixing the fracture in grossly anatomic alignment. The right femoral head remains seated at the acetabulum. No new fractures are seen. IMPRESSION: Status post internal fixation of right femoral fracture in grossly anatomic alignment. Electronically Signed   By: Garald Balding M.D.   On: 01/26/2017 20:55   Dg Hip Operative Unilat W Or W/o Pelvis Right  Result Date: 01/26/2017 CLINICAL DATA:  Status post right hip pinning.  Initial encounter. EXAM: OPERATIVE RIGHT HIP (WITH PELVIS IF PERFORMED) 2 VIEWS  TECHNIQUE: Fluoroscopic spot image(s) were submitted for interpretation post-operatively. COMPARISON:  None. FINDINGS: Five fluoroscopic C-arm images are provided from the OR, demonstrating placement of 3 pins across the patient's right femoral neck fracture, transfixing the fracture in grossly anatomic alignment. The right femoral head remains seated at the acetabulum. No new fractures are seen. IMPRESSION: Status post internal fixation of right femoral fracture in grossly anatomic alignment. Electronically Signed   By: Garald Balding M.D.   On: 01/26/2017 20:55        Scheduled Meds: . acetaminophen  1,000 mg Oral TID  . aspirin EC  325 mg Oral Q breakfast  . docusate sodium  100 mg Oral BID  . doxycycline  100 mg Oral Daily  . gabapentin  300 mg Oral Q breakfast  . pantoprazole  40 mg Oral Q0600   Continuous Infusions: . sodium chloride 50 mL/hr at 01/26/17  2045  . lactated ringers 50 mL/hr at 01/26/17 1633  . methocarbamol (ROBAXIN)  IV Stopped (01/27/17 0016)     LOS: 2 days      Phillips Climes, MD [facility] Pager [phone] If 7PM-7AM, please contact night-coverage [email or web address] Password [email or web address] 01/27/2017, 11:10 AM

## 2017-01-27 NOTE — Evaluation (Signed)
Occupational Therapy Evaluation Patient Details Name: Felicia Cain MRN: [id number] DOB: [birth year] Today's Date: 01/27/2017    History of Present Illness 61 year old female history of depression/anxiety, DCIS right breast status post mastectomy, fibromyalgia, esophageal stricture, optic neuritis, who presents 24 hours after full and striking the right hip on the pavement while at her son's graduation, Pt s/p R hip pinning 01/26/17   Clinical Impression   Pt was living with her mother and was independent PTA. Currently, pt performing ADLs and functional mobility with Min guard A. Pt requires Min A for LB dressing; educated her on use of AE and she demonstrated good understanding.  Pt demonstrated good adherence of PWB precautions throughout session. Pt would benefit from acute OT to increase safety and independence with ADLs and functional mobility. Recommend dc home once medically stable per physician.     Follow Up Recommendations  No OT follow up;Supervision/Assistance - 24 hour    Equipment Recommendations  3 in 1 bedside commode    Recommendations for Other Services PT consult     Precautions / Restrictions Precautions Precautions: Fall Restrictions Weight Bearing Restrictions: No RLE Weight Bearing: Partial weight bearing RLE Partial Weight Bearing Percentage or Pounds: 50%      Mobility Bed Mobility Overal bed mobility: Needs Assistance Bed Mobility: Supine to Sit     Supine to sit: Supervision;HOB elevated     General bed mobility comments: Positioned bed to simulate home environement  Transfers Overall transfer level: Needs assistance Equipment used: Rolling walker (2 wheeled) Transfers: Sit to/from Stand Sit to Stand: Min guard         General transfer comment: pt requires RW for steadying at upright, vc for hand placement    Balance Overall balance assessment: Needs assistance Sitting-balance support: No upper extremity supported;Feet  unsupported Sitting balance-Leahy Scale: Good     Standing balance support: During functional activity;Single extremity supported Standing balance-Leahy Scale: Fair                             ADL either performed or assessed with clinical judgement   ADL Overall ADL's : Needs assistance/impaired Eating/Feeding: Set up;Sitting   Grooming: Min guard;Standing   Upper Body Bathing: Set up;Supervision/ safety;Sitting   Lower Body Bathing: Min guard;Sit to/from stand   Upper Body Dressing : Set up;Supervision/safety;Sitting   Lower Body Dressing: Min guard;With adaptive equipment;Sit to/from stand Lower Body Dressing Details (indicate cue type and reason): Educated pt on how to use AE for LB dressing. Pt very interested in use of sock aide. Pt demonstrated good understanding Toilet Transfer: Min guard;Cueing for sequencing;Ambulation;Regular Toilet;Grab bars;RW Armed forces technical officer Details (indicate cue type and reason): Discussed how pt will replicate at home Toileting- Water quality scientist and Hygiene: Min guard;Sit to/from stand     Tub/Shower Transfer Details (indicate cue type and reason): Will need to educate on walk-in shower transfer with 3N1 Functional mobility during ADLs: Min guard;Rolling walker General ADL Comments: Pt demonstrating good functional performance. Benefits from VCs to increase safe technique. Pt very concerned about bathroom set up and accessablility with RW.      Vision Baseline Vision/History: Wears glasses Wears Glasses: Reading only Patient Visual Report: No change from baseline       Perception     Praxis      Pertinent Vitals/Pain Pain Assessment: 0-10 Pain Score: 6  Pain Location: L knee and thigh Pain Descriptors / Indicators: Burning Pain Intervention(s): Monitored during session;Ice applied  Hand Dominance Right   Extremity/Trunk Assessment Upper Extremity Assessment Upper Extremity Assessment: Overall WFL for tasks  assessed   Lower Extremity Assessment Lower Extremity Assessment: Defer to PT evaluation LLE: Unable to fully assess due to pain LLE Sensation: history of peripheral neuropathy   Cervical / Trunk Assessment Cervical / Trunk Assessment: Normal   Communication Communication Communication: No difficulties   Cognition Arousal/Alertness: Awake/alert Behavior During Therapy: WFL for tasks assessed/performed Overall Cognitive Status: Within Functional Limits for tasks assessed                                     General Comments      Exercises    Shoulder Instructions      Home Living Family/patient expects to be discharged to:: Private residence Living Arrangements: Children;Parent Available Help at Discharge: Family;Available 24 hours/day Type of Home: House Home Access: Stairs to enter [employer] of Steps: 2 Entrance Stairs-Rails: Left;Right Home Layout: One level     Bathroom Shower/Tub: Walk-in shower;Tub/shower unit   Bathroom Toilet: Handicapped height Bathroom Accessibility: No              Prior Functioning/Environment Level of Independence: Independent        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[employer], driver, 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        OT Problem List: Decreased strength;Decreased range of motion;Decreased activity tolerance;Impaired balance (sitting and/or standing);Decreased safety awareness;Decreased knowledge of use of DME or AE;Decreased knowledge of precautions;Pain      OT Treatment/Interventions: Self-care/ADL training;Therapeutic exercise;Energy conservation;DME and/or AE instruction;Therapeutic activities;Patient/family education    OT Goals(Current goals can be found in the care plan section) Acute Rehab OT Goals Patient Stated Goal: "I want to go home tomorrow." OT Goal Formulation: With patient Time For Goal Achievement: 02/10/17 Potential to Achieve Goals: Good ADL Goals Pt Will Perform Grooming: with supervision;with  set-up;standing Pt Will Perform Lower Body Dressing: with set-up;with supervision;with adaptive equipment;sit to/from stand Pt Will Perform Tub/Shower Transfer: Shower transfer;3 in 1;ambulating;with min guard assist  OT Frequency: Min 2X/week   Barriers to D/C:            Co-evaluation              AM-PAC PT "6 Clicks" Daily Activity     Outcome Measure Help from another person eating meals?: None Help from another person taking care of personal grooming?: A Little Help from another person toileting, which includes using toliet, bedpan, or urinal?: A Little Help from another person bathing (including washing, rinsing, drying)?: A Little Help from another person to put on and taking off regular upper body clothing?: None Help from another person to put on and taking off regular lower body clothing?: A Little 6 Click Score: 20   End of Session Equipment Utilized During Treatment: Rolling walker Nurse Communication: Mobility status  Activity Tolerance: Patient tolerated treatment well Patient left: in bed;with call bell/phone within reach  OT Visit Diagnosis: Unsteadiness on feet (R26.81);Other abnormalities of gait and mobility (R26.89);Muscle weakness (generalized) (M62.81);Pain Pain - Right/Left: Right Pain - part of body: Leg                Time: 1444-1510 OT Time Calculation (min): 26 min Charges:  OT General Charges $OT Visit: 1 Procedure OT Evaluation $OT Eval Low Complexity: 1 Procedure OT Treatments $Self Care/Home Management : 8-22 mins G-Codes:     [employer], OTR/L [phone]  Heywood Footman  Jayce Boyko 01/27/2017, 3:21 PM

## 2017-01-27 NOTE — Evaluation (Signed)
Physical Therapy Evaluation Patient Details Name: Felicia Cain MRN: [id number] DOB: 1956-02-20 Today's Date: 01/27/2017   History of Present Illness  61 year old female history of depression/anxiety, DCIS right breast status post mastectomy, fibromyalgia, esophageal stricture, optic neuritis, who presents 24 hours after full and striking the right hip on the pavement while at her son's graduation, Pt s/p R hip pinning 01/26/17  Clinical Impression  Patient is s/p above surgery resulting in functional limitations due to the deficits listed below (see PT Problem List). PT currently supervision for bed mobility, and min guard for transfer with RW and ambulation of 15 feet with RW. Patient will benefit from skilled PT to increase their independence and safety with mobility to allow discharge to the venue listed below.       Follow Up Recommendations Home health PT;Supervision/Assistance - 24 hour    Equipment Recommendations  Rolling walker with 5" wheels    Recommendations for Other Services OT consult     Precautions / Restrictions Precautions Precautions: Fall Restrictions Weight Bearing Restrictions: No RLE Weight Bearing: Partial weight bearing RLE Partial Weight Bearing Percentage or Pounds: 50%      Mobility  Bed Mobility Overal bed mobility: Needs Assistance Bed Mobility: Supine to Sit     Supine to sit: Supervision;HOB elevated     General bed mobility comments: pt able to bring herself to EoB with use of bedrails  Transfers Overall transfer level: Needs assistance Equipment used: Rolling walker (2 wheeled) Transfers: Sit to/from Stand Sit to Stand: Min guard         General transfer comment: pt requires RW for steadying at upright, vc for hand placement and controlled descent to chair  Ambulation/Gait Ambulation/Gait assistance: Min guard Ambulation Distance (Feet): 15 Feet Assistive device: Rolling walker (2 wheeled) Gait Pattern/deviations: Step-to  pattern (hop-to ) Gait velocity: slowed Gait velocity interpretation: Below normal speed for age/gender General Gait Details: one minor LoB able to correct with RW support, vc for weightbearing status      Balance Overall balance assessment: Needs assistance Sitting-balance support: No upper extremity supported;Feet unsupported Sitting balance-Leahy Scale: Good                                       Pertinent Vitals/Pain Pain Assessment: 0-10 Pain Score: 6  Pain Location: L knee and thigh Pain Descriptors / Indicators: Burning Pain Intervention(s): Limited activity within patient's tolerance  VSS    Home Living Family/patient expects to be discharged to:: Private residence Living Arrangements: Children;Parent Available Help at Discharge: Family;Available 24 hours/day Type of Home: House Home Access: Stairs to enter Entrance Stairs-Rails: Chemical engineer of Steps: 2 Home Layout: One level        Prior Function Level of Independence: Independent         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[employer], driver, 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        Extremity/Trunk Assessment   Upper Extremity Assessment Upper Extremity Assessment: Overall WFL for tasks assessed    Lower Extremity Assessment Lower Extremity Assessment: LLE deficits/detail LLE: Unable to fully assess due to pain LLE Sensation: history of peripheral neuropathy    Cervical / Trunk Assessment Cervical / Trunk Assessment: Normal  Communication   Communication: No difficulties  Cognition Arousal/Alertness: Awake/alert Behavior During Therapy: WFL for tasks assessed/performed Overall Cognitive Status: Within Functional Limits for tasks assessed  General Comments General comments (skin integrity, edema, etc.): multiple family members present throughout session    Exercises Total Joint Exercises Ankle Circles/Pumps: AROM;Both;10  reps;Supine Quad Sets: AROM;Both;10 reps;Supine Heel Slides: AROM;Right;5 reps;Supine Hip ABduction/ADduction: AROM;Right;5 reps;Supine Straight Leg Raises: AROM;Right;5 reps;Supine Long Arc Quad: AROM;Left;10 reps;Seated   Assessment/Plan    PT Assessment Patient needs continued PT services  PT Problem List Decreased strength;Decreased range of motion;Decreased activity tolerance;Decreased balance;Decreased mobility;Impaired sensation;Pain;Decreased safety awareness       PT Treatment Interventions Gait training;Stair training;DME instruction;Functional mobility training;Therapeutic activities;Therapeutic exercise;Balance training;Patient/family education    PT Goals (Current goals can be found in the Care Plan section)  Acute Rehab PT Goals Patient Stated Goal: go home THursday PT Goal Formulation: With patient Time For Goal Achievement: 02/10/17 Potential to Achieve Goals: Good    Frequency 7X/week    AM-PAC PT "6 Clicks" Daily Activity  Outcome Measure Difficulty turning over in bed (including adjusting bedclothes, sheets and blankets)?: A Lot Difficulty moving from lying on back to sitting on the side of the bed? : A Lot Difficulty sitting down on and standing up from a chair with arms (e.g., wheelchair, bedside commode, etc,.)?: A Lot Help needed moving to and from a bed to chair (including a wheelchair)?: A Little Help needed walking in hospital room?: A Little Help needed climbing 3-5 steps with a railing? : A Lot 6 Click Score: 14    End of Session Equipment Utilized During Treatment: Gait belt Activity Tolerance: Patient limited by pain Patient left: in chair;with call bell/phone within reach;with family/visitor present Nurse Communication: Mobility status;Patient requests pain meds;Precautions;Weight bearing status PT Visit Diagnosis: Unsteadiness on feet (R26.81);Other abnormalities of gait and mobility (R26.89);Pain;Other symptoms and signs involving the nervous  system (R29.898);History of falling (Z91.81) Pain - Right/Left: Right Pain - part of body: Hip    Time: 7793-9030 PT Time Calculation (min) (ACUTE ONLY): 44 min   Charges:   PT Evaluation $PT Eval Low Complexity: 1 Procedure PT Treatments $Therapeutic Exercise: 8-22 mins $Therapeutic Activity: 8-22 mins   PT G Codes:        Ayelen Sciortino B. Migdalia Dk PT, DPT Acute Rehabilitation  [phone] Pager [phone]    [place] 01/27/2017, 2:34 PM

## 2017-01-27 NOTE — Progress Notes (Signed)
PT BP was 98/54. Pt stated she wanted a bolus of 262ml so she could take some pain med. Called MD and got an order and administered bolus. Pt BP came up to 109/54 and I administered pain med. Per MAR.

## 2017-01-27 NOTE — Care Management Note (Signed)
Case Management Note  Patient Details  Name: REINETTE CUNEO MRN: [id number] Date of Birth: [birth year]  Subjective/Objective:   61 yr old female s/p fall with right hip fracture, underwent a right hip pinning, cannulated screws 01/26/17.                   Action/Plan: Case manager spoke with patient concerning discharge plan and DME needs. Choice for Home health Agency was offered. Patient says she wants to use the same agency her mom has, [facility], and she wants the same therapist. CM called Candi Leash, [facility] Liaison with referral. CM has requested RW and 3in1 from [facility]. Patient will have family support at discharge.   Expected Discharge Date:   01/28/17               Expected Discharge Plan:  [place]  In-House Referral:  NA  Discharge planning Services  CM Consult  Post Acute Care Choice:  Home Health, Durable Medical Equipment Choice offered to:  Patient  DME Arranged:  3-N-1, Walker rolling DME Agency:  [facility]:  PT [place]:  [place]  Status of Service:  Completed, signed off  If discussed at [place] of Stay Meetings, dates discussed:    Additional Comments:  Ninfa Meeker, RN 01/27/2017, 3:20 PM

## 2017-01-28 ENCOUNTER — Telehealth (INDEPENDENT_AMBULATORY_CARE_PROVIDER_SITE_OTHER): Payer: Self-pay | Admitting: Radiology

## 2017-01-28 ENCOUNTER — Inpatient Hospital Stay (HOSPITAL_COMMUNITY): Payer: 59

## 2017-01-28 ENCOUNTER — Encounter (HOSPITAL_COMMUNITY): Payer: Self-pay | Admitting: Orthopaedic Surgery

## 2017-01-28 ENCOUNTER — Telehealth (INDEPENDENT_AMBULATORY_CARE_PROVIDER_SITE_OTHER): Payer: Self-pay | Admitting: Orthopaedic Surgery

## 2017-01-28 DIAGNOSIS — S72001A Fracture of unspecified part of neck of right femur, initial encounter for closed fracture: Principal | ICD-10-CM

## 2017-01-28 DIAGNOSIS — R2689 Other abnormalities of gait and mobility: Secondary | ICD-10-CM

## 2017-01-28 LAB — CBC
HEMATOCRIT: 29.5 % — AB (ref 36.0–46.0)
Hemoglobin: 9.6 g/dL — ABNORMAL LOW (ref 12.0–15.0)
MCH: 29.5 pg (ref 26.0–34.0)
MCHC: 32.5 g/dL (ref 30.0–36.0)
MCV: 90.8 fL (ref 78.0–100.0)
Platelets: 231 10*3/uL (ref 150–400)
RBC: 3.25 MIL/uL — ABNORMAL LOW (ref 3.87–5.11)
RDW: 12.9 % (ref 11.5–15.5)
WBC: 8 10*3/uL (ref 4.0–10.5)

## 2017-01-28 MED ORDER — FERROUS SULFATE 325 (65 FE) MG PO TABS
325.0000 mg | ORAL_TABLET | Freq: Two times a day (BID) | ORAL | Status: DC
  Administered 2017-01-28 – 2017-01-29 (×2): 325 mg via ORAL
  Filled 2017-01-28 (×2): qty 1

## 2017-01-28 MED ORDER — ASPIRIN EC 81 MG PO TBEC
243.0000 mg | DELAYED_RELEASE_TABLET | Freq: Once | ORAL | Status: AC
  Administered 2017-01-28: 243 mg via ORAL
  Filled 2017-01-28: qty 3

## 2017-01-28 MED ORDER — ASPIRIN 325 MG PO TABS
325.0000 mg | ORAL_TABLET | Freq: Every day | ORAL | Status: DC
  Administered 2017-01-29: 325 mg via ORAL
  Filled 2017-01-28: qty 1

## 2017-01-28 NOTE — Progress Notes (Signed)
01/28/17 1320  What Happened  Was fall witnessed? Yes  Who witnessed fall? Leia Alf Fleet  Patients activity before fall ambulating-assisted  Point of contact hip/leg;arm/shoulder  Was patient injured? No  Follow Up  MD notified Dr. Waldron Labs and Dr. Ninfa Linden  Time MD notified 38  Family notified Yes-comment  Time family notified 1200 (family present at time of fall)  Additional tests Yes-comment (x-ray negative for injury)  Adult Fall Risk Assessment  Risk Factor Category (scoring not indicated) High fall risk per protocol (document High fall risk)  Age 61  Fall History: Fall within 6 months prior to admission 5  Elimination; Bowel and/or Urine Incontinence 0  Elimination; Bowel and/or Urine Urgency/Frequency 0  Medications: includes PCA/Opiates, Anti-convulsants, Anti-hypertensives, Diuretics, Hypnotics, Laxatives, Sedatives, and Psychotropics 7  Patient Care Equipment 1  Mobility-Assistance 2  Mobility-Gait 2  Mobility-Sensory Deficit 0  Altered awareness of immediate physical environment 0  Impulsiveness 0  Lack of understanding of one's physical/cognitive limitations 0  Total Score 18  Patient's Fall Risk High Fall Risk (>13 points)  Adult Fall Risk Interventions  Required Bundle Interventions *See Row Information* High fall risk - low, moderate, and high requirements implemented  Additional Interventions Family Supervision;Use of appropriate toileting equipment (bedpan, BSC, etc.)  Screening for Fall Injury Risk  Risk For Fall Injury- See Row Information  Bones - fracture risk  Required Injury Bundle Interventions *See Row Information* Injury Bundle Implemented  Pain Assessment  Pain Assessment 0-10  Pain Score 4  Pain Type Surgical pain  Pain Location Hip  Pain Orientation Right  Pain Radiating Towards knee  Pain Descriptors / Indicators Aching  Pain Frequency Constant  Pain Onset On-going  Pain Intervention(s) Medication (See eMAR)  Neurological  Neuro  (WDL) X  Level of Consciousness Alert  Orientation Level Oriented X4  Cognition Appropriate at baseline;Follows commands  Speech Clear  Pupil Assessment  No  Motor Function/Sensation Assessment Grip;Motor response;Motor strength;Sensation  R Hand Grip Strong  L Hand Grip Strong  RUE Motor Response Purposeful movement  RUE Sensation Full sensation  RUE Motor Strength 5  LUE Motor Response Purposeful movement  LUE Sensation Full sensation  LUE Motor Strength 5  RLE Motor Response Purposeful movement  RLE Sensation Pain  RLE Motor Strength 4  LLE Motor Response Purposeful movement  LLE Sensation Full sensation  LLE Motor Strength 5  Neuro Symptoms None  Musculoskeletal  Musculoskeletal (WDL) X  Assistive Device Front wheel walker  Generalized Weakness Yes  Weight Bearing Restrictions No  RLE Weight Bearing PWB  RLE Partial Weight Bearing Percentage or Pounds 50%  Musculoskeletal Details  RLE Limited movement;Injury/trauma;Swelling;Surgery  Integumentary  Integumentary (WDL) X  Skin Color Appropriate for ethnicity  Skin Condition Dry  Skin Integrity Intact;Ecchymosis;Surgical Incision (see LDA)  Ecchymosis Location Thigh  Ecchymosis Location Orientation Right;Upper  Ecchymosis Intervention Other (Comment)  Skin Turgor Non-tenting  Pain Assessment  Date Pain First Started 01/25/17 (pain r/t surgery and fall prior to admission)  Result of Injury No  Pain Screening  Clinical Progression Not changed  Effect of Pain on Daily Activities (limited movement r/t surgery and TDWB <50%)  Response to Interventions (positive)  Pain Assessment  Work-Related Injury No

## 2017-01-28 NOTE — Telephone Encounter (Signed)
They are aware that we will follow her until she is no longer in post op care for this

## 2017-01-28 NOTE — Telephone Encounter (Signed)
Received call from answering service. Roland Rack from [place] [phone] wanted Dr. Ninfa Linden to be aware patient had fall while in therapy, there is no injury.  Does Dr. Ninfa Linden want anything done post fall?  Please advise.

## 2017-01-28 NOTE — Progress Notes (Signed)
Patient ID: Felicia Cain, female   DOB: 09-13-1955, 61 y.o.   MRN: [id number] Making slow progress due to pain.  Vitals stable.  Right hip dressing clean and dry.  Wants to go home this afternoon.  Son at the bedside.  I'm fine from an Ortho standpoint for her going home today.  She knows to call my office for follow-up in 2 weeks.  Scripts are on the chart as well as discharge instructions.

## 2017-01-28 NOTE — Progress Notes (Signed)
Physical Therapy Treatment Patient Details Name: Felicia Cain MRN: [id number] DOB: [birth year] Today's Date: 01/28/2017    History of Present Illness 61 year old female history of depression/anxiety, DCIS right breast status post mastectomy, fibromyalgia, esophageal stricture, optic neuritis, who presents 24 hours after full and striking the right hip on the pavement while at her son's graduation, Pt s/p R hip pinning 01/26/17    PT Comments    Pt very motivated to complete stair training to go home today as her extended family is arriving in town for a party to celebrate her son's graduation. Pt ambulated 50 feet into gym for stair training with RW with min guard A. Pt worked with son on stair training ascending backward with RW and descending forward with RW. As pt was practicing ascent of 6" stairs pt experienced fall backward onto R buttocks and R elbow when R knee buckled. Fall descent was slowed by PT utilizing gait belt. Pt requested to continue with step training as she did not experience any increase in pain. Pt ambulated 50 feet back to room with min guard A. Nursing notified after PT session. Nursing called patient's physicians to notify of incident. Pt requires skilled PT to continue to progress ambulation and gait training and to improve UE and LE and endurance to safely discharge to her home environment.      Follow Up Recommendations  Home health PT;Supervision/Assistance - 24 hour     Equipment Recommendations  Rolling walker with 5" wheels    Recommendations for Other Services OT consult     Precautions / Restrictions Precautions Precautions: Fall Restrictions Weight Bearing Restrictions: No RLE Weight Bearing: Partial weight bearing RLE Partial Weight Bearing Percentage or Pounds: 50%    Mobility  Bed Mobility Overal bed mobility: Needs Assistance Bed Mobility: Supine to Sit     Supine to sit: Supervision     General bed mobility comments: pt able to bring  herself to EoB with use of bedrails  Transfers Overall transfer level: Needs assistance Equipment used: Rolling walker (2 wheeled) Transfers: Sit to/from Stand Sit to Stand: Min guard         General transfer comment: vc for hand placement  Ambulation/Gait Ambulation/Gait assistance: Min guard Ambulation Distance (Feet): 150 Feet Assistive device: Rolling walker (2 wheeled);Crutches Gait Pattern/deviations: Step-to pattern (hop to pattern) Gait velocity: slowed Gait velocity interpretation: Below normal speed for age/gender General Gait Details: vc for maintaining weightbearing status while keeping L LE extended, pt ambulates with steady gait. Pt request using crutches to manage in her bathroom as RW will not fit. Min guard with ambulation of 15 feet with good balance.    Stairs Stairs: Yes   Stair Management: With crutches;With walker;Backwards;Step to pattern;One rail Right Number of Stairs: 15 General stair comments: Pt's son present for stair training. Pt trained to go up stairs backwards utilizing RW and was able to ascend 2x6" steps with min guard A, however became fearful coming down stairs catching toe of L foot with minor LoB requiring minA for regaining balance. Pt took seated rest break while PT demonstrated to son and pt proper RW progression and safety of RW placement. Pt then trained on 3x4" on ascent/descent with son holding RW and min guard assist from PT. Pt then attempted 2x6"stairs in same manner and as pt ascended second step pt's L knee buckled and pt fell backwards on step landing on R buttocks and R elbow. Pt stated that she was fine and was not experiencing any additional  pain. Pt was able to stand up to floor with minA from PT. Pt took seated rest break. Pt told PT and son that she felt she would do better on steps with crutches. PT advised that RW was more secure and safer than crutches. Pt adament about trying. Pt educated that she would need to be able to hop  high enough to clear step. Pt not able to hop high enough to clear 6" step utilizing crutches.       Wheelchair Mobility    Modified Rankin (Stroke Patients Only)       Balance Overall balance assessment: Needs assistance Sitting-balance support: No upper extremity supported;Feet unsupported Sitting balance-Leahy Scale: Good     Standing balance support: During functional activity;No upper extremity supported Standing balance-Leahy Scale: Good Standing balance comment: able to adjust clothing in standing without UE support                            Cognition Arousal/Alertness: Awake/alert Behavior During Therapy: Impulsive Overall Cognitive Status: Within Functional Limits for tasks assessed                                        Exercises          Pertinent Vitals/Pain Pain Assessment: 0-10 Pain Score: 5  Pain Location: L knee and thigh Pain Descriptors / Indicators: Burning Pain Intervention(s): Monitored during session;Repositioned  VSS           PT Goals (current goals can now be found in the care plan section) Acute Rehab PT Goals Patient Stated Goal: go home THursday PT Goal Formulation: With patient Time For Goal Achievement: 02/10/17 Potential to Achieve Goals: Good Progress towards PT goals: Progressing toward goals    Frequency    7X/week      PT Plan         AM-PAC PT "6 Clicks" Daily Activity  Outcome Measure  Difficulty turning over in bed (including adjusting bedclothes, sheets and blankets)?: A Lot Difficulty moving from lying on back to sitting on the side of the bed? : A Lot Difficulty sitting down on and standing up from a chair with arms (e.g., wheelchair, bedside commode, etc,.)?: A Lot Help needed moving to and from a bed to chair (including a wheelchair)?: A Little Help needed walking in hospital room?: A Little Help needed climbing 3-5 steps with a railing? : A Lot 6 Click Score: 14    End of  Session Equipment Utilized During Treatment: Gait belt Activity Tolerance: Patient limited by pain Patient left: in chair;with call bell/phone within reach;with family/visitor present Nurse Communication: Mobility status;Precautions;Weight bearing status;Other (comment) (Nursing notified of fall) PT Visit Diagnosis: Unsteadiness on feet (R26.81);Other abnormalities of gait and mobility (R26.89);Pain;Other symptoms and signs involving the nervous system (R29.898);History of falling (Z91.81) Pain - Right/Left: Right Pain - part of body: Hip     Time: 7628-3151 PT Time Calculation (min) (ACUTE ONLY): 69 min  Charges:  $Gait Training: 68-82 mins                    G Codes:       Lipa Knauff B. Migdalia Dk PT, DPT Acute Rehabilitation  [phone] Pager [phone]     [place] 01/28/2017, 1:44 PM

## 2017-01-28 NOTE — Telephone Encounter (Signed)
Please advise 

## 2017-01-28 NOTE — Consult Note (Signed)
Physical Medicine and Rehabilitation Consult Reason for Consult: Decreased functional mobility Referring Physician: [facility]   HPI: Felicia Cain is a 61 y.o. right handed female with history of anxiety/depression, right mastectomy, fibromyalgia, optic neuritis, myasthenia gravis. 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 Her mother was recently discharged from the inpatient rehabilitation service. Presented 01/25/2017 after a fall landing on her right hip without loss of consciousness after attending her son's medical school graduation. X-rays and imaging revealed right hip femoral neck fracture. Underwent cannulated screw fixation 01/26/2017 per Dr. Ninfa Linden. Hospital course pain management. Partial weightbearing right lower extremity. Noted assisted fall without injury during physical therapy. Acute blood loss anemia 9.6 and monitored. Subcutaneous Lovenox for DVT prophylaxis. Physical and occupational therapy evaluations completed. M.D. has requested physical medicine rehabilitation consult.  Patient concerned because she fell on steps today. She had an x-ray that showed normal postop hardware. No complications. She has 3 steps in the front to enter and one step in the back. She would have to cross grass to get to the back step. Review of Systems  Constitutional: Negative for chills and fever.  HENT: Negative for hearing loss and tinnitus.   Eyes: Negative for blurred vision and double vision.  Respiratory: Negative for cough and shortness of breath.   Cardiovascular: Negative for chest pain, palpitations and leg swelling.  Gastrointestinal: Positive for constipation. Negative for nausea.       GERD  Genitourinary: Negative for dysuria, flank pain and hematuria.  Musculoskeletal: Positive for back pain and falls.  Skin: Negative for rash.  Neurological: Positive for dizziness, weakness and headaches. Negative for seizures.  Psychiatric/Behavioral:        Anxiety  All other systems reviewed and are negative.  Past Medical History:  Diagnosis Date  . Anxiety   . Arthritis    "back" (01/25/2017)  . Brachial neuritis    neuropathy  . Chronic lower back pain   . DCIS (ductal carcinoma in situ)    "left side?"  . Esophageal stricture    stricture with dysphasia  . Fibromyalgia   . GERD (gastroesophageal reflux disease)   . H/O Doppler ultrasound 2006   see scanned study  . H/O echocardiogram 2004, 2013  . Hepatitis A 1961   [place]"  . History of cardiac monitoring 2006  . History of nuclear stress test 2004   see scanned study  . Migraine    "in the past; cycle related" (01/25/2017)  . Myasthenia gravis in crisis [facility]) 04/2014   respiratory crisis  . NAION (non-arteritic anterior ischemic optic neuropathy), right   . Near syncope   . Neuromuscular disorder ([place])   . Neuropathy   . Optic neuritis    ischaemic optic neuritis, non arteric  . Optic neuropathy   . PONV (postoperative nausea and vomiting)   . Posterior optic neuritis   . Ptosis of eyelid   . Sleep apnea    "I wear Moses device; I don't wear CPAP" (01/25/2017)  . Spinal stenosis    Past Surgical History:  Procedure Laterality Date  . AUGMENTATION MAMMAPLASTY     2000, after breast cancer surgery,redone 2010  . BACK SURGERY    . BREAST BIOPSY    . CARPAL TUNNEL RELEASE Bilateral   . CESAREAN SECTION  1989  . ESOPHAGOGASTRODUODENOSCOPY (EGD) WITH ESOPHAGEAL DILATION    . HIP PINNING,CANNULATED Right 01/26/2017   Procedure: CANNULATED SCREWS/RIGHT HIP PINNING;  Surgeon: Ninfa Linden,  Lind Guest, MD;  Location: [place];  Service: Orthopedics;  Laterality: Right;  . INNER EAR SURGERY Right 1995  . LUMBAR LAMINECTOMY Right 1999   [name]  . MASTECTOMY Bilateral    Family History  Problem Relation Age of Onset  . Stroke Mother   . Hypertension Mother   . Early death Father   . Cancer Father 56       sarcoma  . Parkinson's disease Maternal  Aunt   . Cancer Maternal Uncle   . Cancer Paternal Grandmother        breast   Social History:  reports that she has never smoked. She has never used smokeless tobacco. She reports that she does not drink alcohol or use drugs. Allergies:  Allergies  Allergen Reactions  . Tape Rash and Other (See Comments)     Blistering on site, can only use paper tape or surgical.  . Sulfa Antibiotics Swelling and Other (See Comments)    Redness, topical only  . Adderall [Amphetamine-Dextroamphetamine] Other (See Comments)    headache  . Latex Itching and Swelling   Medications Prior to Admission  Medication Sig Dispense Refill  . cholecalciferol (VITAMIN D) 1000 UNITS tablet Take 5,000 Units by mouth daily.     Marland Kitchen doxycycline (VIBRAMYCIN) 100 MG capsule Take 100 mg by mouth daily.     Marland Kitchen gabapentin (NEURONTIN) 300 MG capsule Take 300 mg by mouth every morning.    . Multiple Vitamins-Minerals (PRESERVISION AREDS 2 PO) Take 1 capsule by mouth 2 (two) times daily.    . [DISCONTINUED] aspirin (GOODSENSE ASPIRIN) 81 MG chewable tablet Chew 81 mg by mouth daily.       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 Functional Status:  Mobility: Bed Mobility Overal bed mobility: (P) Needs Assistance Bed Mobility: (P) Supine to Sit Supine to sit: (P) Supervision General bed mobility comments: (P) pt able to bring herself to EoB with use of bedrails Transfers Overall transfer level:  (P) Needs assistance Equipment used: (P) Rolling walker (2 wheeled) Transfers: (P) Sit to/from Stand Sit to Stand: (P) Min guard General transfer comment: (P) vc for hand placement Ambulation/Gait Ambulation/Gait assistance: (P) Min guard Ambulation Distance (Feet): (P) 150 Feet Assistive device: (P) Rolling walker (2 wheeled), Crutches Gait Pattern/deviations: (P) Step-to pattern (hop to pattern) General Gait Details: (P) vc for maintaining weightbearing status while keeping L LE extended, pt ambulates with steady gait. Pt request using crutches to manage in her bathroom as RW will not fit. Min guard with ambulation of 15 feet with good balance.  Gait velocity: (P) slowed Gait velocity interpretation: (P) Below normal speed for age/gender Stairs: (P) Yes    ADL: ADL Overall ADL's : Needs assistance/impaired Eating/Feeding: Set up, Sitting Grooming: Min guard, Standing Upper Body Bathing: Set up, Supervision/ safety, Sitting Lower Body Bathing: Min guard, Sit to/from stand Upper Body Dressing : Set up, Supervision/safety, Sitting Lower Body Dressing: Min guard, With adaptive equipment, Sit to/from stand Lower Body Dressing Details (indicate cue type and reason): Educated pt on how to use AE for LB dressing. Pt very  interested in use of sock aide. Pt demonstrated good understanding Toilet Transfer: Min guard, Cueing for sequencing, Ambulation, Regular Toilet, Grab bars, RW Toilet Transfer Details (indicate cue type and reason): Discussed how pt will replicate at home [place] and Hygiene: Min guard, Sit to/from Insurance underwriter Details (indicate cue type and reason): Will need to educate on walk-in shower transfer with 3N1 Functional mobility during ADLs: Min guard, Rolling walker General ADL Comments: Pt demonstrating good functional performance. Benefits from VCs to increase safe technique. Pt very concerned about bathroom set up and accessablility  with RW.   Cognition: Cognition Overall Cognitive Status: (P) Within Functional Limits for tasks assessed Orientation Level: Oriented X4 Cognition Arousal/Alertness: (P) Awake/alert Behavior During Therapy: (P) Impulsive Overall Cognitive Status: (P) Within Functional Limits for tasks assessed  Blood pressure 112/62, pulse 79, temperature 98.1 F (36.7 C), temperature source Oral, resp. rate 17, SpO2 97 %. Physical Exam  Constitutional: She appears well-developed.  HENT:  Head: Normocephalic.  Eyes: EOM are normal.  Neck: Normal range of motion. Neck supple. No thyromegaly present.  Cardiovascular: Normal rate and regular rhythm.   Respiratory: Effort normal and breath sounds normal. No respiratory distress.  GI: Soft. Bowel sounds are normal. She exhibits no distension.  Neurological: She is alert.  Patient is very tearful. Alert and oriented 3.  Skin:  Surgical site clean and dry appropriately tender  Motor strength is 5/5 bilateral deltoid by stress up grip as well as left hip flexion, knee extension, left ankle dorsiflexion 3 minus right hip flexion and knee extension with pain inhibition. 5/5 and ankle dorsiflexor and plantar flexor. Extremities show expected edema right thigh with mild tenderness  Results for orders placed or performed during the hospital encounter of 01/25/17 (from the past 24 hour(s))  CBC     Status: Abnormal   Collection Time: 01/28/17  3:45 AM  Result Value Ref Range   WBC 8.0 4.0 - 10.5 K/uL   RBC 3.25 (L) 3.87 - 5.11 MIL/uL   Hemoglobin 9.6 (L) 12.0 - 15.0 g/dL   HCT 29.5 (L) 36.0 - 46.0 %   MCV 90.8 78.0 - 100.0 fL   MCH 29.5 26.0 - 34.0 pg   MCHC 32.5 30.0 - 36.0 g/dL   RDW 12.9 11.5 - 15.5 %   Platelets 231 150 - 400 K/uL   Dg C-arm 1-60 Min  Result Date: 01/26/2017 CLINICAL DATA:  Status post right hip pinning.  Initial encounter. EXAM: OPERATIVE RIGHT HIP (WITH PELVIS IF PERFORMED) 2 VIEWS TECHNIQUE: Fluoroscopic spot image(s) were  submitted for interpretation post-operatively. COMPARISON:  None. FINDINGS: Five fluoroscopic C-arm images are provided from the OR, demonstrating placement of 3 pins across the patient's right femoral neck fracture, transfixing the fracture in grossly anatomic alignment. The right femoral head remains seated at the acetabulum. No new fractures are seen. IMPRESSION: Status post internal fixation of right femoral fracture in grossly anatomic alignment. Electronically Signed   By: Garald Balding M.D.   On: 01/26/2017 20:55   Dg Hip Operative Unilat W Or W/o Pelvis Right  Result Date: 01/26/2017 CLINICAL DATA:  Status post right hip pinning.  Initial encounter. EXAM: OPERATIVE RIGHT HIP (WITH PELVIS IF PERFORMED) 2 VIEWS TECHNIQUE: Fluoroscopic spot image(s) were submitted for interpretation post-operatively. COMPARISON:  None. FINDINGS: Five fluoroscopic C-arm images are provided from the OR, demonstrating placement of 3 pins across the patient's right femoral neck fracture, transfixing the fracture in grossly anatomic alignment. The right femoral head remains seated  at the acetabulum. No new fractures are seen. IMPRESSION: Status post internal fixation of right femoral fracture in grossly anatomic alignment. Electronically Signed   By: Garald Balding M.D.   On: 01/26/2017 20:55    Assessment/Plan: Diagnosis: Right femoral neck fracture, status post ORIF. Partial weightbearing. 1. Does the need for close, 24 hr/day medical supervision in concert with the patient's rehab needs make it unreasonable for this patient to be served in a less intensive setting? No 2. Co-Morbidities requiring supervision/potential complications: Mild acute blood loss anemia, postoperative pain 3. Due to bowel management, safety, skin/wound care, medication administration, pain management and patient education, does the patient require 24 hr/day rehab nursing? No 4. Does the patient require coordinated care of a physician, rehab  nurse, PT, OT to address physical and functional deficits in the context of the above medical diagnosis(es)? No Addressing deficits in the following areas: balance, locomotion, strength, transferring, bathing and toileting 5. Can the patient actively participate in an intensive therapy program of at least 3 hrs of therapy per day at least 5 days per week? Potentially 6. The potential for patient to make measurable gains while on inpatient rehab is NA 7. Anticipated functional outcomes upon discharge from inpatient rehab are n/a  with PT, n/a with OT, n/a with SLP. 8. Estimated rehab length of stay to reach the above functional goals is: NA 9. Anticipated D/C setting: Home 10. Anticipated post D/C treatments: [place] therapy 11. Overall Rehab/Functional Prognosis: excellent  RECOMMENDATIONS: This patient's condition is appropriate for continued rehabilitative care in the following setting: [facility] Patient has agreed to participate in recommended program. Potentially Note that insurance prior authorization may be required for reimbursement for recommended care.  Comment: Patient states she wants to go home, but is very nervous. Discussed with OT should be at a modified independent level in 1-2 days. We discussed that she cannot leave the hospital and come back to attend a party.  Charlett Blake M.D. [facility] FAAPM&R (Sports Med, Neuromuscular Med) Diplomate Am Board of Electrodiagnostic Med  Cathlyn Parsons., PA-C 01/28/2017

## 2017-01-28 NOTE — Progress Notes (Signed)
Patient had an assisted fall without injury during physical therapy.  Attending physician (Dr. Waldron Labs) made aware and message left with Orthopedic physican (Dr. Trevor Mace) office.  Case management made aware, patient would like wheelchair.

## 2017-01-28 NOTE — Telephone Encounter (Signed)
Tayna with [facility] called asked if Dr Ninfa Linden will be following the home health orders for  (PT). The number to contact Lavella Lemons is [phone]

## 2017-01-28 NOTE — Progress Notes (Signed)
PROGRESS NOTE    Felicia Cain  JJH:[id number] DOB: Jul 01, 1956 DOA: 01/25/2017 PCP: Lanice Shirts, MD    Brief Narrative:  Patient is a pleasant 61 year old female history of depression/anxiety, DCIS right breast status post mastectomy, fibromyalgia, esophageal stricture, optic neuritis, who presents 24 hours after full and striking the right hip on the pavement while at her son's graduation. Patient with complaints of right hip pain. Plain films done consistent with a fracture.she went  surgical repair 5/22 by Dr. Ninfa Linden.  Assessment & Plan    Principal Problem:   Closed hip fracture, right, initial encounter [facility]) Active Problems:   GERD (gastroesophageal reflux disease)   Hip fracture (HCC)   Preop examination   Fibromyalgia   Neuropathy   Corneal abrasion   Nausea   Fall  right closed hip fracture - Secondary to  mechanical fall, orthopedic input greatly appreciated, she went for surgical repair with right hip pinning/cannulated screws by Dr. Ninfa Linden on 5/22 . - continue with when necessary pain and nausea medication , upper therapy, only up to 50% weight on right hip also. - Seen by PT, will have CIR evaluate patient, she is not a candidate with plan for home care.  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 Outpatient follow-up.  corneal abrasion - Continue home regimen doxycycline.  fibromyalgia/neuropathy - Stable. Continue Neurontin.  gastroesophageal reflux disease - PPI.    DVT prophylaxis: Full dose aspirin Code Status: Full Family Communication: Updated son, DR Tyrell Antonio at bedside. Disposition Plan: pending PT consult    Consultants:   Orthopedics: Dr. Ninfa Linden 01/25/2017  Procedures:   Status post right hip surgical  repairCANNULATED SCREWS/RIGHT HIP PINNING (Right)  by Dr. Ninfa Linden  Antimicrobials:   None   Subjective: Complaining of some right hip pain, reports it is worse today, as she has been doing some more activity and PT today, denies any chest pain or shortness of breath . Objective: Vitals:   01/27/17 1350 01/27/17 1500 01/27/17 2200 01/28/17 0611  BP: (!) 110/44 (!) 109/59 (!) 102/52 112/62  Pulse:  75 84 79  Resp:   18 17  Temp:  98.7 F (37.1 C) 98.1 F (36.7 C) 98.1 F (36.7 C)  TempSrc:  Oral Oral Oral  SpO2:  92% 98% 97%    Intake/Output Summary (Last 24 hours) at 01/28/17 1310 Last data filed at 01/28/17 0900  Gross per 24 hour  Intake              600 ml  Output                0 ml  Net              600 ml   There were no vitals filed for this visit.  Examination:  Awake alert 3, no apparent distress Good air entry bilaterally, no wheezing rales or rhonchi S1, S2 heard, no rubs murmurs gallops, RRR Abdomen soft, nontender nondistended bowel sounds present Extremity, with mild edema around the right hip area, some expected ecchymosis around the surgical site    Data Reviewed: I have personally reviewed following labs and imaging studies  CBC:  Recent Labs Lab 01/25/17 1419 01/26/17 0650 01/27/17 0807 01/28/17 0345  WBC 8.8 7.6 10.4 8.0  NEUTROABS  --   --  8.4*  --   HGB 12.4 12.7 10.7* 9.6*  HCT 37.3 37.5 32.7* 29.5*  MCV 91.0 90.8 90.6 90.8  PLT 245 243 257 161   Basic Metabolic Panel:  Recent Labs Lab 01/25/17 1419 01/26/17 0650 01/27/17 0807  NA 137 138 137  K 3.6 4.1 3.9  CL 106 106 103  CO2 24 25 27   GLUCOSE 102* 107* 132*  BUN 21* 14 10  CREATININE 0.61 0.59 0.69  CALCIUM 8.4* 8.9 9.1   GFR: CrCl cannot be calculated (Unknown ideal weight.). Liver Function Tests: No results for input(s): AST, ALT, ALKPHOS, BILITOT, PROT, ALBUMIN in the last 168 hours. No results for input(s): LIPASE, AMYLASE in the last 168 hours. No results  for input(s): AMMONIA in the last 168 hours. Coagulation Profile:  Recent Labs Lab 01/25/17 1419  INR 1.05   Cardiac Enzymes: No results for input(s): CKTOTAL, CKMB, CKMBINDEX, TROPONINI in the last 168 hours. BNP (last 3 results) No results for input(s): PROBNP in the last 8760 hours. HbA1C: No results for input(s): HGBA1C in the last 72 hours. CBG: No results for input(s): GLUCAP in the last 168 hours. Lipid Profile: No results for input(s): CHOL, HDL, LDLCALC, TRIG, CHOLHDL, LDLDIRECT in the last 72 hours. Thyroid Function Tests: No results for input(s): TSH, T4TOTAL, FREET4, T3FREE, THYROIDAB in the last 72 hours. Anemia Panel: No results for input(s): VITAMINB12, FOLATE, FERRITIN, TIBC, IRON, RETICCTPCT in the last 72 hours. Sepsis Labs: No results for input(s): PROCALCITON, LATICACIDVEN in the last 168 hours.  Recent Results (from the past 240 hour(s))  Surgical pcr screen     Status: None   Collection Time: 01/26/17  7:56 AM  Result Value Ref Range Status   MRSA, PCR NEGATIVE NEGATIVE Final   Staphylococcus aureus NEGATIVE NEGATIVE Final    Comment:        The Xpert SA Assay (FDA approved for NASAL specimens in patients over 88 years of age), is one component of a comprehensive surveillance program.  Test performance has been validated by [facility] for patients greater than or equal to 93 year old. It is not intended to diagnose infection nor to guide or monitor treatment.          Radiology Studies: Dg C-arm 1-60 Min  Result Date: 01/26/2017 CLINICAL DATA:  Status post right hip pinning.  Initial encounter. EXAM: OPERATIVE RIGHT HIP (WITH PELVIS IF PERFORMED) 2 VIEWS TECHNIQUE: Fluoroscopic spot image(s) were submitted for interpretation post-operatively. COMPARISON:  None. FINDINGS: Five fluoroscopic C-arm images are provided from the OR, demonstrating placement of 3 pins across the patient's right femoral neck fracture, transfixing the fracture in  grossly anatomic alignment. The right femoral head remains seated at the acetabulum. No new fractures are seen. IMPRESSION: Status post internal fixation of right femoral fracture in grossly anatomic alignment. Electronically Signed   By: Garald Balding M.D.   On: 01/26/2017 20:55   Dg Hip Operative Unilat W Or W/o Pelvis Right  Result Date: 01/26/2017 CLINICAL DATA:  Status post right hip pinning.  Initial encounter. EXAM: OPERATIVE RIGHT HIP (WITH PELVIS IF PERFORMED) 2 VIEWS TECHNIQUE: Fluoroscopic spot image(s) were submitted for interpretation post-operatively. COMPARISON:  None. FINDINGS: Five fluoroscopic C-arm images are provided from the OR, demonstrating placement of 3 pins across the patient's right femoral neck fracture, transfixing the fracture in grossly anatomic alignment. The right femoral head remains seated at the acetabulum. No new fractures are seen. IMPRESSION: Status post internal fixation of right femoral fracture in grossly anatomic  alignment. Electronically Signed   By: Garald Balding M.D.   On: 01/26/2017 20:55        Scheduled Meds: . acetaminophen  1,000 mg Oral TID  . aspirin EC  243 mg Oral Once  . [START ON 01/29/2017] aspirin  325 mg Oral Daily  . docusate sodium  100 mg Oral BID  . doxycycline  100 mg Oral Daily  . enoxaparin (LOVENOX) injection  40 mg Subcutaneous Q24H  . ferrous sulfate  325 mg Oral BID WC  . gabapentin  300 mg Oral Q breakfast  . pantoprazole  40 mg Oral Q0600   Continuous Infusions: . sodium chloride 50 mL/hr at 01/26/17 2045  . lactated ringers 50 mL/hr at 01/26/17 1633  . methocarbamol (ROBAXIN)  IV Stopped (01/28/17 0351)     LOS: 3 days      Phillips Climes, MD [facility] Pager [phone] If 7PM-7AM, please contact night-coverage [email or web address] Password [email or web address] 01/28/2017, 1:10 PM

## 2017-01-28 NOTE — Progress Notes (Signed)
Occupational Therapy Treatment Patient Details Name: Felicia Cain MRN: [id number] DOB: 07-Nov-1955 Today's Date: 01/28/2017    History of present illness 61 year old female history of depression/anxiety, DCIS right breast status post mastectomy, fibromyalgia, esophageal stricture, optic neuritis, who presents 24 hours after full and striking the right hip on the pavement while at her son's graduation, Pt s/p R hip pinning 01/26/17   OT comments  Pt progressing towards goals and performing ADLs and functional mobility at [place] guard level. Pt performed toileting, bathing, and grooming with Min guard A and RW. Provided education on shower transfer and pt demonstrated understanding with Min guard A for safety. Pt highly motivated and benefits from VCs to slow down during session. Continue recommend dc home once medically stable per physician. Will continue to follow acutely as admitted to facilitate safe dc home.    Follow Up Recommendations  No OT follow up;Supervision/Assistance - 24 hour    Equipment Recommendations  3 in 1 bedside commode    Recommendations for Other Services PT consult    Precautions / Restrictions Precautions Precautions: Fall Restrictions Weight Bearing Restrictions: No RLE Weight Bearing: Partial weight bearing RLE Partial Weight Bearing Percentage or Pounds: 50%       Mobility Bed Mobility               General bed mobility comments: Pt in chair upon arrival  Transfers Overall transfer level: Needs assistance Equipment used: Rolling walker (2 wheeled) Transfers: Sit to/from Stand Sit to Stand: Min guard         General transfer comment: vc for hand placement    Balance Overall balance assessment: Needs assistance Sitting-balance support: No upper extremity supported;Feet unsupported Sitting balance-Leahy Scale: Good     Standing balance support: During functional activity;No upper extremity supported Standing balance-Leahy Scale:  Good                             ADL either performed or assessed with clinical judgement   ADL Overall ADL's : Needs assistance/impaired     Grooming: Min guard;Standing;Wash/dry face   Upper Body Bathing: Set up;Supervision/ safety;Sitting   Lower Body Bathing: Min guard;Sit to/from stand   Upper Body Dressing : Set up;Supervision/safety;Sitting   Lower Body Dressing: Min guard;With adaptive equipment;Sit to/from stand   Toilet Transfer: Min guard;Ambulation;Regular Toilet;Grab bars;RW   Toileting- Water quality scientist and Hygiene: Min guard;Sit to/from stand   Tub/ Shower Transfer: Walk-in shower;Min guard;Ambulation;3 in 1;Rolling walker   Functional mobility during ADLs: Min guard;Rolling walker General ADL Comments: Pt performed toileting, shower, and grooming during session with Min-guard A.  Educated pt on shower transfer and she demonstrated understanding     Vision       Perception     Praxis      Cognition Arousal/Alertness: Awake/alert Behavior During Therapy: WFL for tasks assessed/performed;Anxious Overall Cognitive Status: Within Functional Limits for tasks assessed                                 General Comments: Pt became emotional during session with feeling overwhelmed. Pt able to push through and complete session.         Exercises     Shoulder Instructions       General Comments      Pertinent Vitals/ Pain       Pain Assessment: Faces Faces Pain Scale: Hurts even more  Pain Location: L knee and thigh Pain Descriptors / Indicators: Burning Pain Intervention(s): Monitored during session;Ice applied;Repositioned  Home Living                                          Prior Functioning/Environment              Frequency  Min 2X/week        Progress Toward Goals  OT Goals(current goals can now be found in the care plan section)  Progress towards OT goals: Progressing toward  goals  Acute Rehab OT Goals Patient Stated Goal: go home Thursday OT Goal Formulation: With patient Time For Goal Achievement: 02/10/17 Potential to Achieve Goals: Good ADL Goals Pt Will Perform Grooming: with supervision;with set-up;standing Pt Will Perform Lower Body Dressing: with set-up;with supervision;with adaptive equipment;sit to/from stand Pt Will Perform Tub/Shower Transfer: Shower transfer;3 in 1;ambulating;with min guard assist  Plan Discharge plan remains appropriate    Co-evaluation                 AM-PAC PT "6 Clicks" Daily Activity     Outcome Measure   Help from another person eating meals?: None Help from another person taking care of personal grooming?: A Little Help from another person toileting, which includes using toliet, bedpan, or urinal?: A Little Help from another person bathing (including washing, rinsing, drying)?: A Little Help from another person to put on and taking off regular upper body clothing?: None Help from another person to put on and taking off regular lower body clothing?: A Little 6 Click Score: 20    End of Session Equipment Utilized During Treatment: Rolling walker  OT Visit Diagnosis: Unsteadiness on feet (R26.81);Other abnormalities of gait and mobility (R26.89);Muscle weakness (generalized) (M62.81);Pain Pain - Right/Left: Right Pain - part of body: Leg   Activity Tolerance Patient tolerated treatment well   Patient Left with call bell/phone within reach;in chair   Nurse Communication Mobility status        Time: 3300-7622 OT Time Calculation (min): 54 min  Charges: OT General Charges $OT Visit: 1 Procedure OT Treatments $Self Care/Home Management : 53-67 mins  [place], OTR/L [place] 01/28/2017, 4:49 PM

## 2017-01-28 NOTE — Consult Note (Signed)
   [facility] Inpatient Consult   01/28/2017  Felicia Cain [birth year] [id number]    Came to visit Felicia Cain at bedside on behalf of [place] to Wellness program for [place] employees/dependents with [facility] insurance. However, she was out of the room with therapy. Left Link to Wellness brochure, contact information and 24hr nurse line magnet at bedside.    [name], MSN-Ed, RN,BSN [facility] Liaison [phone]

## 2017-01-28 NOTE — Anesthesia Postprocedure Evaluation (Addendum)
Anesthesia Post Note  Patient: Felicia Cain  Procedure(s) Performed: Procedure(s) (LRB): CANNULATED SCREWS/RIGHT HIP PINNING (Right)  Patient location during evaluation: PACU Anesthesia Type: General Level of consciousness: awake and alert Pain management: pain level controlled Vital Signs Assessment: post-procedure vital signs reviewed and stable Respiratory status: spontaneous breathing, nonlabored ventilation, respiratory function stable and patient connected to nasal cannula oxygen Cardiovascular status: blood pressure returned to baseline and stable Postop Assessment: no signs of nausea or vomiting Anesthetic complications: no       Last Vitals:  Vitals:   01/28/17 0611 01/28/17 1514  BP: 112/62 (!) 103/44  Pulse: 79   Resp: 17   Temp: 36.7 C 37.1 C    Last Pain:  Vitals:   01/28/17 1728  TempSrc:   PainSc: 7                  Chantia Amalfitano

## 2017-01-29 DIAGNOSIS — S72001S Fracture of unspecified part of neck of right femur, sequela: Secondary | ICD-10-CM

## 2017-01-29 LAB — CBC
HCT: 29.3 % — ABNORMAL LOW (ref 36.0–46.0)
Hemoglobin: 9.7 g/dL — ABNORMAL LOW (ref 12.0–15.0)
MCH: 30.2 pg (ref 26.0–34.0)
MCHC: 33.1 g/dL (ref 30.0–36.0)
MCV: 91.3 fL (ref 78.0–100.0)
PLATELETS: 241 10*3/uL (ref 150–400)
RBC: 3.21 MIL/uL — ABNORMAL LOW (ref 3.87–5.11)
RDW: 12.9 % (ref 11.5–15.5)
WBC: 6.4 10*3/uL (ref 4.0–10.5)

## 2017-01-29 LAB — BASIC METABOLIC PANEL
Anion gap: 8 (ref 5–15)
BUN: 14 mg/dL (ref 6–20)
CALCIUM: 8.8 mg/dL — AB (ref 8.9–10.3)
CO2: 27 mmol/L (ref 22–32)
Chloride: 104 mmol/L (ref 101–111)
Creatinine, Ser: 0.59 mg/dL (ref 0.44–1.00)
GFR calc Af Amer: >60 mL/min (ref >60)
GLUCOSE: 96 mg/dL (ref 65–99)
Potassium: 4.7 mmol/L (ref 3.5–5.1)
Sodium: 139 mmol/L (ref 135–145)

## 2017-01-29 LAB — MAGNESIUM: Magnesium: 1.9 mg/dL (ref 1.7–2.4)

## 2017-01-29 MED ORDER — FERROUS SULFATE 325 (65 FE) MG PO TABS: 325.0000 mg | ORAL_TABLET | Freq: Two times a day (BID) | ORAL | 3 refills | Status: DC

## 2017-01-29 MED ORDER — PANTOPRAZOLE SODIUM 40 MG PO TBEC: 40.0000 mg | DELAYED_RELEASE_TABLET | Freq: Every day | ORAL | 0 refills | Status: DC

## 2017-01-29 MED ORDER — ONDANSETRON HCL 4 MG PO TABS: 4.0000 mg | ORAL_TABLET | Freq: Four times a day (QID) | ORAL | 0 refills | Status: DC | PRN

## 2017-01-29 MED ORDER — ASPIRIN 325 MG PO TABS: 325.0000 mg | ORAL_TABLET | Freq: Every day | ORAL | 0 refills | Status: DC

## 2017-01-29 MED ORDER — OXYCODONE HCL 5 MG PO TABS: 5.0000 mg | ORAL_TABLET | ORAL | 0 refills | Status: DC | PRN

## 2017-01-29 MED FILL — oxyCODONE HCL 5 MG TABS: 5 | 5 days supply | Qty: 60 | Fill #0

## 2017-01-29 MED FILL — ONDANSETRON HCL 4 MG TABLET: 4 | 5 days supply | Qty: 20 | Fill #0

## 2017-01-29 MED FILL — PANTOPRAZOLE SOD DR 40 MG T: 40 | 90 days supply | Qty: 90 | Fill #0

## 2017-01-29 MED FILL — METHOCARBAMOL 500 MG TABLET: 500 | 15 days supply | Qty: 60 | Fill #0

## 2017-01-29 NOTE — Progress Notes (Signed)
Physical Therapy Treatment Patient Details Name: Felicia Cain MRN: [id number] DOB: 1956/05/17 Today's Date: 01/29/2017    History of Present Illness 61 year old female history of depression/anxiety, DCIS right breast status post mastectomy, fibromyalgia, esophageal stricture, optic neuritis, who presents 24 hours after full and striking the right hip on the pavement while at her son's graduation, Pt s/p R hip pinning 01/26/17    PT Comments    Pt was trained on wheelchair usage including proper use and adjustments of leg rests, propulsion and turning and transfers to and from wheelchair with proper usage of brakes. Pt demonstrates understanding of safe wheelchair use. Pt will benefit from skilled PT to continue to progress her gait and stair training and to improve her overall strength and endurance to safely navigate in her home environment after discharge.    Follow Up Recommendations  Home health PT;Supervision/Assistance - 24 hour     Equipment Recommendations  Rolling walker with 5" wheels    Recommendations for Other Services OT consult     Precautions / Restrictions Precautions Precautions: Fall Restrictions Weight Bearing Restrictions: No RLE Weight Bearing: Partial weight bearing RLE Partial Weight Bearing Percentage or Pounds: 50%    Mobility  Bed Mobility Overal bed mobility: Needs Assistance Bed Mobility: Supine to Sit     Supine to sit: Supervision     General bed mobility comments: pt able to bring herself to EoB with use of bedrails  Transfers Overall transfer level: Needs assistance Equipment used: Rolling walker (2 wheeled) Transfers: Sit to/from Stand Sit to Stand: Min guard         General transfer comment: good power up and steadying in RW  Ambulation/Gait Ambulation/Gait assistance: Min guard Ambulation Distance (Feet): 10 Feet Assistive device: Rolling walker (2 wheeled);Crutches Gait Pattern/deviations: Step-to pattern (hop to  pattern) Gait velocity: slowed   General Gait Details: vc for maintaining partial weightbearing     Information systems manager mobility: Yes Wheelchair propulsion: Both upper extremities Wheelchair parts: Needs assistance (pt fitted for and educated on use of leg rests, and brakes) Distance: [place] Details (indicate cue type and reason): pt educated on proper safety and propulsion of wheelchair, pt requires supervision for propulsion and minA for managing leg rests   Modified Rankin (Stroke Patients Only)       Balance Overall balance assessment: Needs assistance Sitting-balance support: No upper extremity supported;Feet unsupported Sitting balance-Leahy Scale: Good     Standing balance support: During functional activity;No upper extremity supported Standing balance-Leahy Scale: Good                              Cognition Arousal/Alertness: Awake/alert Behavior During Therapy: Impulsive Overall Cognitive Status: Within Functional Limits for tasks assessed                                           General Comments General comments (skin integrity, edema, etc.): Pt son and cousin present for wheelchair training and demonstrated understanding of how to safely use wheelchair      Pertinent Vitals/Pain Pain Assessment: Faces Faces Pain Scale: Hurts little more Pain Location: L knee and thigh Pain Descriptors / Indicators: Burning;Radiating Pain Intervention(s): Monitored during session  VSS           PT Goals (current goals can now be found in the  care plan section) Acute Rehab PT Goals Patient Stated Goal: to go home PT Goal Formulation: With patient Time For Goal Achievement: 02/10/17 Potential to Achieve Goals: Good Progress towards PT goals: Progressing toward goals    Frequency    7X/week      PT Plan         AM-PAC PT "6 Clicks" Daily Activity  Outcome Measure   Difficulty turning over in bed (including adjusting bedclothes, sheets and blankets)?: A Lot Difficulty moving from lying on back to sitting on the side of the bed? : A Lot Difficulty sitting down on and standing up from a chair with arms (e.g., wheelchair, bedside commode, etc,.)?: A Lot Help needed moving to and from a bed to chair (including a wheelchair)?: A Little Help needed walking in hospital room?: A Little Help needed climbing 3-5 steps with a railing? : A Lot 6 Click Score: 14    End of Session Equipment Utilized During Treatment: Gait belt Activity Tolerance: Patient limited by pain Patient left: in chair;with call bell/phone within reach;with family/visitor present Nurse Communication: Mobility status;Precautions;Weight bearing status;Other (comment) (PT escorted pt to car circle and helped with transfer into c) PT Visit Diagnosis: Unsteadiness on feet (R26.81);Other abnormalities of gait and mobility (R26.89);Pain;Other symptoms and signs involving the nervous system (R29.898);History of falling (Z91.81) Pain - Right/Left: Right Pain - part of body: Hip     Time: 1308-6578 PT Time Calculation (min) (ACUTE ONLY): 33 min  Charges:  $Wheel Chair Management: 23-37 mins                    G Codes:       [name] B. Migdalia Dk PT, DPT Acute Rehabilitation  [phone] Pager [phone]     [place] 01/29/2017, 3:06 PM

## 2017-01-29 NOTE — Progress Notes (Signed)
Rehab admissions - Please see rehab consult done by Dr. Letta Pate recommending [facility] therapies.  Noted PT recommending HH and OT not recommending any follow up. Call me for questions.  #[phone]

## 2017-01-29 NOTE — Progress Notes (Signed)
Pt ready for discharge. Education/instructions reviewed with pt and all questions/concerns addressed. IV removed and belongings gathered. Pt will be transported out via wheelchair to family member's vehicle. Will continue to monitor

## 2017-01-29 NOTE — Discharge Summary (Addendum)
Felicia Cain, is a 61 y.o. female  DOB Jan 11, 1956  MRN [id number].  Admission date:  01/25/2017  Admitting Physician  Waldemar Dickens, MD  Discharge Date:  01/29/2017   Primary MD  Lanice Shirts, MD  Recommendations for primary care physician for things to follow:  - Please check CBC, BMP during next visit  Admission Diagnosis  HIP FX RIGHT HIP FRACTURE   Discharge Diagnosis  HIP FX RIGHT HIP FRACTURE    Principal Problem:   Closed hip fracture, right, initial encounter [facility]) Active Problems:   GERD (gastroesophageal reflux disease)   Hip fracture ([place])   Preop examination   Fibromyalgia   Neuropathy   Corneal abrasion   Nausea   Fall      Past Medical History:  Diagnosis Date  . Anxiety   . Arthritis    "back" (01/25/2017)  . Brachial neuritis    neuropathy  . Chronic lower back pain   . DCIS (ductal carcinoma in situ)    "left side?"  . Esophageal stricture    stricture with dysphasia  . Fibromyalgia   . GERD (gastroesophageal reflux disease)   . H/O Doppler ultrasound 2006   see scanned study  . H/O echocardiogram 2004, 2013  . Hepatitis A 1961   [place]"  . History of cardiac monitoring 2006  . History of nuclear stress test 2004   see scanned study  . Migraine    "in the past; cycle related" (01/25/2017)  . Myasthenia gravis in crisis [facility]) 04/2014   respiratory crisis  . NAION (non-arteritic anterior ischemic optic neuropathy), right   . Near syncope   . Neuromuscular disorder ([place])   . Neuropathy   . Optic neuritis    ischaemic optic neuritis, non arteric  . Optic neuropathy   . PONV (postoperative nausea and vomiting)   . Posterior optic neuritis   . Ptosis of eyelid   . Sleep apnea    "I wear Moses device; I don't wear CPAP" (01/25/2017)  . Spinal stenosis     Past Surgical History:  Procedure Laterality Date  . AUGMENTATION  MAMMAPLASTY     2000, after breast cancer surgery,redone 2010  . BACK SURGERY    . BREAST BIOPSY    . CARPAL TUNNEL RELEASE Bilateral   . CESAREAN SECTION  1989  . ESOPHAGOGASTRODUODENOSCOPY (EGD) WITH ESOPHAGEAL DILATION    . HIP PINNING,CANNULATED Right 01/26/2017   Procedure: CANNULATED SCREWS/RIGHT HIP PINNING;  Surgeon: Mcarthur Rossetti, MD;  Location: [facility];  Service: Orthopedics;  Laterality: Right;  . INNER EAR SURGERY Right 1995  . LUMBAR LAMINECTOMY Right 1999   [name]  . MASTECTOMY Bilateral        History of present illness and  Hospital Course:     Kindly see H&P for history of present illness and admission details, please review complete Labs, Consult reports and Test reports for all details in brief  HPI  from the history and physical done on the day of  admission 01/25/2017  HPI: Felicia Cain is a 60 y.o. female with medical history significant of anxiety/depression, DCIS R breast s/p mastecomty, fibromyalgia, esophageal stricture, optic neuritis, Myasthenia gravis. Presenting 24hrs after falling and striking R hip on the pavement. Patient reports falling just after attending her son's medical school graduation. While ambul [name] irregularity in the pavement due to lack of peripheral vision in the right eye. This caused her to fall and strike her right hip. Patient denies any head trauma. Right hip immediately painful. Constant. Worse with certain movements. Improves with narcotic medications given [facility]. Currently also complaining of minimal right great toe numbness which is [place] over the last few hours. Denies any recent fevers, chest pain, palpitations, LOC, vertigo, dizziness, abdominal pain, dysuria, frequency.   Hospital Course  Patient is a pleasant 61 year old female history of depression/anxiety, DCIS right breast status post mastectomy, fibromyalgia, esophageal stricture, optic neuritis, who presents 24 hours after full and striking the  right hip on the pavement while at her son's graduation. Patient with complaints of right hip pain. Plain films done consistent with a fracture.she went  surgical repair 5/22 by Dr. Ninfa Linden.  right closed hip fracture - Secondary to  mechanical fall, orthopedic input greatly appreciated, she went for surgical repair with right hip pinning/cannulated screws by Dr. Ninfa Linden on 5/22 . - Seen by PT, will discharge with home PT.  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 Outpatient follow-up.  corneal abrasion - Continue home regimen doxycycline.  fibromyalgia/neuropathy - Stable. Continue Neurontin.  gastroesophageal reflux disease - PPI.     Discharge Condition:  stable   Follow UP  Follow-up Information    Mcarthur Rossetti, MD. Schedule an appointment as soon as possible for a visit in 2 week(s).   Specialty:  Orthopedic Surgery Contact information: [place] [address] [phone]        Care, [facility] Follow up.   Specialty:  [place] Why:  A representative from [facility] will contact you to arrange start date and time for your therapy. Contact information: [place] [address] [phone]        Lanice Shirts, MD Follow up in 1 week(s).   Specialty:  Internal Medicine Contact information: [address] [place] [address] [phone]             Discharge Instructions  and  Discharge Medications    Discharge Instructions    AMB Referral to [place] Management    Complete by:  As directed    Please assign UMR member for post discharge call. Currently at  [facility]. Please call with questions. Thanks. [name], [place], [facility] [phone]   Reason for  consult:  Please assign UMR member for post discharge call   Expected date of contact:  1-3 days (reserved for hospital discharges)   Discharge instructions    Complete by:  As directed    Follow with Primary MD Felicia Cain, Felicia Cabal, MD in 7 days   Get CBC, CMP,  checked  by Primary MD next visit.    Activity, Only limited weight on your right hip. with Full fall precautions use walker/cane & assistance as needed    Disposition Home    Diet: regular  On your next visit with your primary  care physician please Get Medicines reviewed and adjusted.   Please request your Prim.MD to go over all Hospital Tests and Procedure/Radiological results at the follow up, please get all Hospital records sent to your Prim MD by signing hospital release before you go home.   If you experience worsening of your admission symptoms, develop shortness of breath, life threatening emergency, suicidal or homicidal thoughts you must seek medical attention immediately by calling 911 or calling your MD immediately  if symptoms less severe.  You Must read complete instructions/literature along with all the possible adverse reactions/side effects for all the Medicines you take and that have been prescribed to you. Take any new Medicines after you have completely understood and accpet all the possible adverse reactions/side effects.   Do not drive, operating heavy machinery, perform activities at heights, swimming or participation in water activities or provide baby sitting services if your were admitted for syncope or siezures until you have seen by Primary MD or a Neurologist and advised to do so again.  Do not drive when taking Pain medications.    Do not take more than prescribed Pain, Sleep and Anxiety Medications  Special Instructions: If you have smoked or chewed Tobacco  in the last 2 yrs please stop smoking, stop any regular Alcohol  and or any Recreational drug use.  Wear Seat belts while  driving.   Please note  You were cared for by a hospitalist during your hospital stay. If you have any questions about your discharge medications or the care you received while you were in the hospital after you are discharged, you can call the unit and asked to speak with the hospitalist on call if the hospitalist that took care of you is not available. Once you are discharged, your primary care physician will handle any further medical issues. Please note that NO REFILLS for any discharge medications will be authorized once you are discharged, as it is imperative that you return to your primary care physician (or establish a relationship with a primary care physician if you do not have one) for your aftercare needs so that they can reassess your need for medications and monitor your lab values.   Increase activity slowly    Complete by:  As directed    Only limited weight on your right hip.     Allergies as of 01/29/2017      Reactions   Tape Rash, Other (See Comments)    Blistering on site, can only use paper tape or surgical.   Sulfa Antibiotics Swelling, Other (See Comments)   Redness, topical only   Adderall [amphetamine-dextroamphetamine] Other (See Comments)   headache   Latex Itching, Swelling      Medication List    STOP taking these medications   GOODSENSE ASPIRIN 81 MG chewable tablet Generic drug:  aspirin Replaced by:  aspirin 325 MG tablet     TAKE these medications   aspirin 325 MG tablet Take 1 tablet (325 mg total) by mouth daily. Replaces:  GOODSENSE ASPIRIN 81 MG chewable tablet   cholecalciferol 1000 units tablet Commonly known as:  VITAMIN D Take 5,000 Units by mouth daily.   doxycycline 100 MG capsule Commonly known as:  VIBRAMYCIN Take 100 mg by mouth daily.   ferrous sulfate 325 (65 FE) MG tablet Take 1 tablet (325 mg total) by mouth 2 (two) times daily with a meal.   gabapentin 300 MG capsule Commonly known as:  NEURONTIN Take 300 mg by mouth  every  morning.   methocarbamol 500 MG tablet Commonly known as:  ROBAXIN Take 1 tablet (500 mg total) by mouth every 6 (six) hours as needed for muscle spasms.   oxyCODONE 5 MG immediate release tablet Commonly known as:  Oxy IR/ROXICODONE Take 1-2 tablets (5-10 mg total) by mouth every 4 (four) hours as needed for severe pain.   PRESERVISION AREDS 2 PO Take 1 capsule by mouth 2 (two) times daily.            Durable Medical Equipment        Start     Ordered   01/28/17 1159  For home use only DME lightweight manual wheelchair with seat cushion  Once    Comments:  Patient suffers from right hip fracture which impairs their ability to perform daily activities like ambulating in the home.  A cane  will not resolve  issue with performing activities of daily living. A wheelchair will allow patient to safely perform daily activities. Patient is not able to propel themselves in the home using a standard weight wheelchair due to generalized weakness. Patient can self propel in the lightweight wheelchair.  Accessories: elevating leg rests (ELRs), wheel locks, extensions and anti-tippers.   01/28/17 1206   01/27/17 1517  For home use only DME 3 n 1  Once     01/27/17 1517   01/27/17 1517  For home use only DME Walker rolling  Once    Question:  Patient needs a walker to treat with the following condition  Answer:  Hip fracture ([place])   01/27/17 1517        Diet and Activity recommendation: See Discharge Instructions above   Consults obtained -  ortho   Major procedures and Radiology Reports - PLEASE review detailed and final reports for all details, in brief -    Status post right hip surgical repairCANNULATED SCREWS/RIGHT HIP PINNING (Right)  by Dr. Ninfa Linden   Dg Pelvis Portable  Result Date: 01/25/2017 CLINICAL DATA:  Hip fracture EXAM: PORTABLE PELVIS 1-2 VIEWS COMPARISON:  None. FINDINGS: Nondisplaced right femoral neck fracture. No other fracture or dislocation. No  lytic or sclerotic osseous lesion. IMPRESSION: Nondisplaced right femoral neck fracture. Electronically Signed   By: Kathreen Devoid   On: 01/25/2017 14:33   Dg Chest Port 1 View  Result Date: 01/25/2017 CLINICAL DATA:  Preop for hip fracture. EXAM: PORTABLE CHEST 1 VIEW COMPARISON:  Radiographs of July 19, 2015. FINDINGS: The heart size and mediastinal contours are within normal limits. Both lungs are clear. No pneumothorax or pleural effusion is noted. The visualized skeletal structures are unremarkable. IMPRESSION: No acute cardiopulmonary abnormality seen. Electronically Signed   By: Marijo Conception, M.D.   On: 01/25/2017 14:31   Dg Foot 2 Views Left  Result Date: 01/20/2017 Please see detailed radiograph report in office note.  Dg Foot 2 Views Right  Result Date: 01/20/2017 Please see detailed radiograph report in office note.  Dg C-arm 1-60 Min  Result Date: 01/26/2017 CLINICAL DATA:  Status post right hip pinning.  Initial encounter. EXAM: OPERATIVE RIGHT HIP (WITH PELVIS IF PERFORMED) 2 VIEWS TECHNIQUE: Fluoroscopic spot image(s) were submitted for interpretation post-operatively. COMPARISON:  None. FINDINGS: Five fluoroscopic C-arm images are provided from the OR, demonstrating placement of 3 pins across the patient's right femoral neck fracture, transfixing the fracture in grossly anatomic alignment. The right femoral head remains seated at the acetabulum. No new fractures are seen. IMPRESSION: Status post internal fixation of right femoral fracture in  grossly anatomic alignment. Electronically Signed   By: Garald Balding M.D.   On: 01/26/2017 20:55   Dg Hip Operative Unilat W Or W/o Pelvis Right  Result Date: 01/26/2017 CLINICAL DATA:  Status post right hip pinning.  Initial encounter. EXAM: OPERATIVE RIGHT HIP (WITH PELVIS IF PERFORMED) 2 VIEWS TECHNIQUE: Fluoroscopic spot image(s) were submitted for interpretation post-operatively. COMPARISON:  None. FINDINGS: Five fluoroscopic  C-arm images are provided from the OR, demonstrating placement of 3 pins across the patient's right femoral neck fracture, transfixing the fracture in grossly anatomic alignment. The right femoral head remains seated at the acetabulum. No new fractures are seen. IMPRESSION: Status post internal fixation of right femoral fracture in grossly anatomic alignment. Electronically Signed   By: Garald Balding M.D.   On: 01/26/2017 20:55   Dg Hip Unilat With Pelvis 2-3 Views Right  Result Date: 01/28/2017 CLINICAL DATA:  Fall.  Recent fracture. EXAM: DG HIP (WITH OR WITHOUT PELVIS) 2-3V RIGHT COMPARISON:  01/26/2017 . FINDINGS: Surgical screws noted in the right hip. Hardware intact. Anatomic alignment. Degenerative changes lumbar spine and both hips. Pelvic calcifications consistent phleboliths. IMPRESSION: Surgical screws in the right hip. Hardware intact. Anatomic alignment. Right femoral fracture stable. Electronically Signed   By: Marcello Moores  Register   On: 01/28/2017 14:57    Micro Results     Recent Results (from the past 240 hour(s))  Surgical pcr screen     Status: None   Collection Time: 01/26/17  7:56 AM  Result Value Ref Range Status   MRSA, PCR NEGATIVE NEGATIVE Final   Staphylococcus aureus NEGATIVE NEGATIVE Final    Comment:        The Xpert SA Assay (FDA approved for NASAL specimens in patients over 26 years of age), is one component of a comprehensive surveillance program.  Test performance has been validated by [facility] for patients greater than or equal to 61 year old. It is not intended to diagnose infection nor to guide or monitor treatment.        Today   Subjective:   Tenika Keeran today has no headache,no chest or abdominal pain, complains of right hip pain .  Objective:   Blood pressure (!) 103/58, pulse 71, temperature 97.8 F (36.6 C), temperature source Oral, resp. rate 15, SpO2 99 %.   Intake/Output Summary (Last 24 hours) at 01/29/17 1220 Last  data filed at 01/29/17 0813  Gross per 24 hour  Intake              480 ml  Output                0 ml  Net              480 ml    Exam Awake Alert, Oriented x 3, Symmetrical Chest wall movement, Good air movement bilaterally, CTAB RRR,No Gallops,Rubs or new Murmurs, No Parasternal Heave +ve B.Sounds, Abd Soft, Non tender, No organomegaly appriciated, No rebound -guarding or rigidity. No Cyanosis, Clubbing or edema,Improving edema around the right hip area, some expected ecchymosis around the surgical site(stable).  Data Review   CBC w Diff:  Lab Results  Component Value Date   WBC 6.4 01/29/2017   HGB 9.7 (L) 01/29/2017   HCT 29.3 (L) 01/29/2017   PLT 241 01/29/2017   LYMPHOPCT 9 01/27/2017   MONOPCT 10 01/27/2017   EOSPCT 0 01/27/2017   BASOPCT 0 01/27/2017    CMP:  Lab Results  Component Value Date   NA 139  01/29/2017   K 4.7 01/29/2017   CL 104 01/29/2017   CO2 27 01/29/2017   BUN 14 01/29/2017   CREATININE 0.59 01/29/2017   CREATININE 0.58 09/25/2013   PROT 7.2 07/19/2015   ALBUMIN 4.0 07/19/2015   BILITOT 0.8 07/19/2015   ALKPHOS 62 07/19/2015   AST 35 07/19/2015   ALT 27 07/19/2015  .   Total Time in preparing paper work, data evaluation and todays exam - 35 minutes  Breccan Galant M.D on 01/29/2017 at 12:20 PM  [facility]   Office  [phone]

## 2017-01-31 DIAGNOSIS — G7000 Myasthenia gravis without (acute) exacerbation: Secondary | ICD-10-CM | POA: Diagnosis not present

## 2017-01-31 DIAGNOSIS — M797 Fibromyalgia: Secondary | ICD-10-CM | POA: Diagnosis not present

## 2017-01-31 DIAGNOSIS — F329 Major depressive disorder, single episode, unspecified: Secondary | ICD-10-CM | POA: Diagnosis not present

## 2017-01-31 DIAGNOSIS — M25511 Pain in right shoulder: Secondary | ICD-10-CM | POA: Diagnosis not present

## 2017-01-31 DIAGNOSIS — G4733 Obstructive sleep apnea (adult) (pediatric): Secondary | ICD-10-CM | POA: Diagnosis not present

## 2017-01-31 DIAGNOSIS — S7291XD Unspecified fracture of right femur, subsequent encounter for closed fracture with routine healing: Secondary | ICD-10-CM | POA: Diagnosis not present

## 2017-01-31 DIAGNOSIS — G629 Polyneuropathy, unspecified: Secondary | ICD-10-CM | POA: Diagnosis not present

## 2017-01-31 DIAGNOSIS — F419 Anxiety disorder, unspecified: Secondary | ICD-10-CM | POA: Diagnosis not present

## 2017-02-02 ENCOUNTER — Other Ambulatory Visit: Payer: Self-pay | Admitting: *Deleted

## 2017-02-02 DIAGNOSIS — M797 Fibromyalgia: Secondary | ICD-10-CM | POA: Diagnosis not present

## 2017-02-02 DIAGNOSIS — G4733 Obstructive sleep apnea (adult) (pediatric): Secondary | ICD-10-CM | POA: Diagnosis not present

## 2017-02-02 DIAGNOSIS — F329 Major depressive disorder, single episode, unspecified: Secondary | ICD-10-CM | POA: Diagnosis not present

## 2017-02-02 DIAGNOSIS — F419 Anxiety disorder, unspecified: Secondary | ICD-10-CM | POA: Diagnosis not present

## 2017-02-02 DIAGNOSIS — M25511 Pain in right shoulder: Secondary | ICD-10-CM | POA: Diagnosis not present

## 2017-02-02 DIAGNOSIS — S7291XD Unspecified fracture of right femur, subsequent encounter for closed fracture with routine healing: Secondary | ICD-10-CM | POA: Diagnosis not present

## 2017-02-02 DIAGNOSIS — G629 Polyneuropathy, unspecified: Secondary | ICD-10-CM | POA: Diagnosis not present

## 2017-02-02 DIAGNOSIS — G7000 Myasthenia gravis without (acute) exacerbation: Secondary | ICD-10-CM | POA: Diagnosis not present

## 2017-02-02 MED FILL — oxyCODONE HCL 5 MG TABS: 5 | 2 days supply | Qty: 30 | Fill #0

## 2017-02-02 NOTE — Patient Outreach (Signed)
Felicia Cain [facility]) Care Management  02/02/2017  Felicia Cain 1955-12-19 [id number]  Subjective: Telephone call to patient's home number, no answer, left HIPAA compliant voicemail message, and requested call back.   Objective: Per chart review, patient hospitalized 01/25/17 - 01/29/17 for RIGHT HIP FRACTURE.   Status post Cannulated screw fixation, right hip, femoral neck fracture on 01/26/17.   Assessment: Received UMR Transition of care referral on 01/28/17.  Transition of care follow up pending patient contact.   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    Plan: RNCM will call patient for 2nd telephone outreach attempt, transition of care follow up, within 10 business days if no return call.   Jossette Zirbel H. Annia Friendly, BSN, [place] [facility] Telephonic CM Phone: [phone] Fax: [phone]

## 2017-02-03 ENCOUNTER — Other Ambulatory Visit: Payer: Self-pay | Admitting: *Deleted

## 2017-02-03 ENCOUNTER — Ambulatory Visit (INDEPENDENT_AMBULATORY_CARE_PROVIDER_SITE_OTHER): Payer: 59 | Admitting: Orthopaedic Surgery

## 2017-02-03 ENCOUNTER — Ambulatory Visit (INDEPENDENT_AMBULATORY_CARE_PROVIDER_SITE_OTHER): Payer: 59

## 2017-02-03 DIAGNOSIS — S72002D Fracture of unspecified part of neck of left femur, subsequent encounter for closed fracture with routine healing: Secondary | ICD-10-CM

## 2017-02-03 DIAGNOSIS — M25551 Pain in right hip: Secondary | ICD-10-CM

## 2017-02-03 DIAGNOSIS — M25561 Pain in right knee: Secondary | ICD-10-CM

## 2017-02-03 MED ORDER — METHYLPREDNISOLONE ACETATE 40 MG/ML IJ SUSP
40.0000 mg | INTRAMUSCULAR | Status: AC | PRN
  Administered 2017-02-03: 40 mg via INTRA_ARTICULAR

## 2017-02-03 MED ORDER — OXYCODONE HCL 5 MG PO CAPS
5.0000 mg | ORAL_CAPSULE | Freq: Four times a day (QID) | ORAL | 0 refills | Status: DC | PRN
Start: 2017-02-03 — End: 2017-02-05

## 2017-02-03 MED ORDER — LIDOCAINE HCL 1 % IJ SOLN
3.0000 mL | INTRAMUSCULAR | Status: AC | PRN
  Administered 2017-02-03: 3 mL

## 2017-02-03 NOTE — Patient Outreach (Signed)
[facility] [place] [facility]) Care Management  02/03/2017  Felicia Cain 1956-07-05 [id number]   Subjective: Telephone call to patient's home number, no answer, left HIPAA compliant voicemail message, and requested call back.   Objective: Per chart review, patient hospitalized 01/25/17 - 01/29/17 for RIGHT HIP FRACTURE.   Status post Cannulated screw fixation, right hip, femoral neck fracture on 01/26/17.   Assessment: Received UMR Transition of care referral on 01/28/17.  Transition of care follow up pending patient contact.   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    Plan: RNCM will call patient for 3rd telephone outreach attempt, transition of care follow up, within 10 business days if no return call.   Felicia Cain H. Annia Friendly, BSN, [place] [facility] [place] Telephonic CM Phone: [phone] Fax: [phone]

## 2017-02-03 NOTE — Progress Notes (Signed)
Office Visit Note   Patient: Felicia Cain           Date of Birth: [birth year]           MRN: [id number] Visit Date: 02/03/2017              Requested by: Lanice Shirts, MD [address] [place], [place] [address] PCP: Lanice Shirts, MD   Assessment & Plan: Visit Diagnoses:  1. Pain in right hip   2. Acute pain of right knee   3. Closed fracture of left hip with routine healing, subsequent encounter     Plan: She tolerated the steroid injection well and her right knee. I cannot aspirating fluid out of the knee. It sounds like physical therapy is been doing too much with her that and putting ankle weights on her and working on strengthening exercises which is absolutely out of the question. They do not need doing anything for working on balance coordination with only touchdown weightbearing. I think that that is caused her to going to spasms in the therapist rec she doing too much. I told him to tell therapist to back off and they can call me if there is any issues from a therapy standpoint. I would like to see her back in 3 weeks. I would like an AP and lateral of her right hip only at that visit. I did refill her oxycodone encounter about narcotics use. I do feel it's okay to try anti-inflammatories at this point  Follow-Up Instructions: Return in about 4 weeks (around 03/03/2017).   Orders:  Orders Placed This Encounter  Procedures  . XR HIP UNILAT W OR W/O PELVIS 2-3 VIEWS RIGHT  . XR Knee 1-2 Views Right   Meds ordered this encounter  Medications  . oxycodone (OXY-IR) 5 MG capsule    Sig: Take 1 capsule (5 mg total) by mouth every 6 (six) hours as needed.    Dispense:  300 capsule    Refill:  0      Procedures: Large Joint Inj Date/Time: 02/03/2017 11:28 AM Performed by: Mcarthur Rossetti Authorized by: Jean Rosenthal Y   Location:  Knee Site:  R knee Ultrasound Guidance: No   Fluoroscopic Guidance: No   Arthrogram: No     Medications:  3 mL lidocaine 1 %; 40 mg methylPREDNISolone acetate 40 MG/ML     Clinical Data: No additional findings.   Subjective: No chief complaint on file. The patient is over a week out from cannulated screw fixation of a nondisplaced right hip femoral neck fracture. She's been having severe pain in her hip and knee recently. She's been having physical therapy at home with a been working on strengthening her leg and making her do a lot of exercises. Unfortunately that is too much. She is only need to work on balance and coordination.  HPI  Review of Systems She denies any headache, chest pain, shortness breath, fever, chills, nausea, vomiting  Objective: Vital Signs: There were no vitals taken for this visit.  Physical Exam She is alert and oriented 3 and in no acute distress Ortho Exam Examination of her right knee shows missing effusion. Him his right hip shows significant bruising all around her hip and swelling in the thigh. Her incision looks great. Specialty Comments:  No specialty comments available.  Imaging: Xr Hip Unilat W Or W/o Pelvis 2-3 Views Right  Result Date: 02/03/2017 The pelvis lateral of her right hip shows intact hardware  from fixation of a femoral neck fracture. The alignment is near anatomic.    PMFS History: Patient Active Problem List   Diagnosis Date Noted  . Hip fracture ([place]) 01/25/2017  . Closed left hip fracture ([place]) 01/25/2017  . Closed hip fracture, right, initial encounter ([place])   . Preop examination   . Fibromyalgia   . Neuropathy   . Corneal abrasion   . Nausea   . Fall   . SIRS (systemic inflammatory response syndrome) ([place]) 07/19/2015  . Seronegative myasthenia gravis ([place]) 07/19/2015  . Acute exacerbation of chronic low back pain 07/19/2015  . Leukocytosis 07/19/2015  . Myasthenia gravis in remission ([place]) 10/23/2014  . OSA on CPAP 10/23/2014  . Hypersomnia, persistent 10/23/2014  . Neurogenic hypoventilation  08/14/2014  . Hypoxemia 08/14/2014  . Myasthenia gravis with exacerbation ([place]) 08/14/2014  . Myasthenia gravis with exacerbation, ocular ([place]) 12/08/2012  . Right shoulder pain 08/02/2012  . 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  . History of optic neuritis 06/27/2012  . Rosacea 06/27/2012  . S/P laminectomy 06/27/2012  . 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 S/P bilateral mastectomy 06/26/2012  . BRCA negative 06/26/2012  . GERD (gastroesophageal reflux disease) 06/26/2012  . Menopause 06/26/2012  . Ankle pain 06/02/2012   Past Medical History:  Diagnosis Date  . Anxiety   . Arthritis    "back" (01/25/2017)  . Brachial neuritis    neuropathy  . Chronic lower back pain   . DCIS (ductal carcinoma in situ)    "left side?"  . Esophageal stricture    stricture with dysphasia  . Fibromyalgia   . GERD (gastroesophageal reflux disease)   . H/O Doppler ultrasound 2006   see scanned study  . H/O echocardiogram 2004, 2013  . Hepatitis A 1961   "epidemic in my city"  . History of cardiac monitoring 2006  . History of nuclear stress test 2004   see scanned study  . Migraine    "in the past; cycle related" (01/25/2017)  . Myasthenia gravis in crisis [facility]) 04/2014   respiratory crisis  . NAION (non-arteritic anterior ischemic optic neuropathy), right   . Near syncope   . Neuromuscular disorder ([place])   . Neuropathy   . Optic neuritis    ischaemic optic neuritis, non arteric  . Optic neuropathy   . PONV (postoperative nausea and vomiting)   . Posterior optic neuritis   . Ptosis of eyelid   . Sleep apnea    "I wear Moses device; I don't wear CPAP" (01/25/2017)  . Spinal stenosis     Family History  Problem Relation Age of Onset  . Stroke Mother   . Hypertension Mother   . Early death Father   . Cancer Father 85       sarcoma  . Parkinson's disease Maternal Aunt   . Cancer Maternal Uncle   . Cancer Paternal Grandmother        breast    Past Surgical History:    Procedure Laterality Date  . AUGMENTATION MAMMAPLASTY     2000, after breast cancer surgery,redone 2010  . BACK SURGERY    . BREAST BIOPSY    . CARPAL TUNNEL RELEASE Bilateral   . CESAREAN SECTION  1989  . ESOPHAGOGASTRODUODENOSCOPY (EGD) WITH ESOPHAGEAL DILATION    . HIP PINNING,CANNULATED Right 01/26/2017   Procedure: CANNULATED SCREWS/RIGHT HIP PINNING;  Surgeon: Mcarthur Rossetti, MD;  Location: [facility];  Service: Orthopedics;  Laterality: Right;  . INNER EAR SURGERY Right  1995  . LUMBAR LAMINECTOMY Right 1999   [name]  . MASTECTOMY Bilateral    Social History   Occupational History  . Not on file.   Social History Main Topics  . Smoking status: Never Smoker  . Smokeless tobacco: Never Used  . Alcohol use No  . Drug use: No  . Sexual activity: Not Currently

## 2017-02-04 ENCOUNTER — Encounter: Payer: Self-pay | Admitting: *Deleted

## 2017-02-04 ENCOUNTER — Other Ambulatory Visit: Payer: Self-pay | Admitting: *Deleted

## 2017-02-04 DIAGNOSIS — D649 Anemia, unspecified: Secondary | ICD-10-CM | POA: Diagnosis not present

## 2017-02-04 DIAGNOSIS — R197 Diarrhea, unspecified: Secondary | ICD-10-CM | POA: Diagnosis not present

## 2017-02-04 MED FILL — traMADol HCL 50 MG TABS: 50 | 8 days supply | Qty: 30 | Fill #0

## 2017-02-04 NOTE — Patient Outreach (Signed)
[facility]) Care Management  02/04/2017  Felicia Cain 02/22/1956 [id number]  Subjective: Telephone call to patient's home number, no answer, left HIPAA compliant voicemail message, and requested call back. Telephone call to patient's mobile number, spoke with female answering the phone, states she is currently in the middle of something important, and will return call.   Objective: Per chart review, patient hospitalized 01/25/17 - 01/29/17 for RIGHT HIP FRACTURE. Status post Cannulated screw fixation, right hip, femoral neck fracture on 01/26/17. 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  Assessment: Received [facility] Transition of care referral on 01/28/17. Transition of care follow up pending patient contact.     Plan: RNCM will send unsuccessful outreach  letter, [facility] pamphlet, and proceed with case closure, within 10 business days if no return call.    Aymee Fomby H. Annia Friendly, BSN, [place] [facility] Telephonic CM Phone: [phone] Fax: [phone]

## 2017-02-04 NOTE — Patient Outreach (Addendum)
Felicia Cain) Care Management  02/04/2017  Felicia Cain 1955-11-26 [id number]   Subjective: Received message from patient that she is returning call and requested call back.  Telephone call to patient's mobile number, spoke with patient, and HIPAA verified.  Discussed [facility] Care Management [facility] Transition of care follow up, patient voiced understanding, and is in agreement to follow up.   Patient states she is doing ok, had hospital follow up appointment today with primary MD's office, and visit went well.  States her son has been assisting her since surgery, will be returning to [place] on 02/05/17, she will no longer have dedicated assistance, and will do her best to manage with friends.  States her mother lives with her, mother recently got out of the hospital, and they are both receiving Cain from [facility].  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  States she does not have financial resources to pay out of pocket for home care at this time and will do her best to manage with assistance from friends.  Patient states she has Child psychotherapist, has opened a claim and has several questions regarding this insurance.  RNCM verbally gave patient contact number for [employer] [phone]), she voices understanding, is appreciative of the information, and states will follow up to ask questions regarding this insurance.     States she did not purchase hospital indemnity plan.  Patient states she has been receiving home health and the home health agency is waiting for insurance authorization to continue Cain.  Patient asked RNCM to follow up with home health agency regarding authorization status on her behalf.   States her mother is also receiving  Cain through same home health agency for 2 hours and they are assisting her while they are in the home working with her mother.  Patient states she is very overwhelmed with things that she needs to follow up on and is grateful any assistance.  States is planning to contact Jesusita Oka with [employer] to stop her retirement saving deductions from her pay check while she is out of work.  RNCM verbally gave patient contact number for Jesusita Oka [phone]).  Patient appreciative of the information and states she will follow up.  Telephone call to Gabriel Cirri at [facility] [phone]), states they provide non-skilled homecare Cain and to call [facility] [phone]) regarding skilled home health Cain.  Telephone call to Marcie Bal at [facility] [phone]), transferred to [place], states patient was initially authorized by [facility] for 2 home health physical therapy visits through 02/04/17.   States the 2 visits have been completed on 01/31/17 and 02/02/17.  Additional authorization request submitted to [facility] on 02/02/17, no update on authorization to date, and home health agency intake department will follow up on authorization status on 02/05/17.  States their company's practice is to follow on authorization request with insurance company every 2 days up to 2 weeks or until authorization obtain, or if not obtained will contact ordering MD for additional guidance/ orders.   States they also encourage patient to follow up with the insurance company regarding authorization status.  Telephone call to patient's mobile number, spoke with patient, and HIPAA verified. RNCM updated patient on authorization status per home health agency, patient  voices understanding, and states she will also follow up with [facility] to verify status.  States she has also spoken with Jesusita Oka at [facility] regarding her retirement deductions.  RNCM verbally gave patient contact information for [facility] Benefit  Specialist [phone]).   Patient states she does not have any transition of care, care coordination, disease management, disease monitoring, transportation, community resource, or pharmacy needs at this time.  States she is very appreciative of the follow up and is in agreement to receive [facility] information.   Objective: Per chart review, patient hospitalized 01/25/17 - 01/29/17 for RIGHT HIP FRACTURE. Status post Cannulated screw fixation, right hip, femoral neck fracture on 01/26/17. 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  Assessment: Received [facility] Transition of care referral on 01/28/17. Transition of care follow up completed, no care management needs, and will proceed with case closure.     Plan: RNCM will send patient successful outreach letter, [facility] pamphlet, and magnet. RNCM will send case closure due to follow up completed / no care management needs request to [facility] [facility].    Nathalya Wolanski H. Annia Friendly, BSN, [place] [facility] Telephonic CM Phone: [phone] Fax: [phone]

## 2017-02-05 ENCOUNTER — Emergency Department (HOSPITAL_COMMUNITY): Admit: 2017-02-05 | Payer: 59

## 2017-02-05 ENCOUNTER — Emergency Department (HOSPITAL_BASED_OUTPATIENT_CLINIC_OR_DEPARTMENT_OTHER)
Admission: RE | Admit: 2017-02-05 | Discharge: 2017-02-05 | Disposition: A | Discharge status: Home / Self Care | Payer: 59 | Source: Ambulatory Visit | Attending: Emergency Medicine | Admitting: Emergency Medicine

## 2017-02-05 ENCOUNTER — Emergency Department (HOSPITAL_COMMUNITY)
Admission: EM | Admit: 2017-02-05 | Discharge: 2017-02-05 | Disposition: A | Discharge status: Home / Self Care | Payer: 59 | Attending: Emergency Medicine | Admitting: Emergency Medicine

## 2017-02-05 ENCOUNTER — Encounter (HOSPITAL_COMMUNITY): Payer: Self-pay

## 2017-02-05 ENCOUNTER — Emergency Department (HOSPITAL_COMMUNITY): Payer: 59

## 2017-02-05 DIAGNOSIS — G8918 Other acute postprocedural pain: Secondary | ICD-10-CM | POA: Diagnosis not present

## 2017-02-05 DIAGNOSIS — M79609 Pain in unspecified limb: Secondary | ICD-10-CM

## 2017-02-05 DIAGNOSIS — M7989 Other specified soft tissue disorders: Secondary | ICD-10-CM | POA: Diagnosis not present

## 2017-02-05 DIAGNOSIS — R197 Diarrhea, unspecified: Secondary | ICD-10-CM | POA: Diagnosis not present

## 2017-02-05 DIAGNOSIS — M25551 Pain in right hip: Secondary | ICD-10-CM | POA: Diagnosis not present

## 2017-02-05 LAB — CBC WITH DIFFERENTIAL/PLATELET
BASOS ABS: 0.1 10*3/uL (ref 0.0–0.1)
BASOS PCT: 1 %
Eosinophils Absolute: 0.3 10*3/uL (ref 0.0–0.7)
Eosinophils Relative: 3 %
HEMATOCRIT: 31.4 % — AB (ref 36.0–46.0)
HEMOGLOBIN: 10.3 g/dL — AB (ref 12.0–15.0)
LYMPHS PCT: 19 %
Lymphs Abs: 1.7 10*3/uL (ref 0.7–4.0)
MCH: 29.7 pg (ref 26.0–34.0)
MCHC: 32.8 g/dL (ref 30.0–36.0)
MCV: 90.5 fL (ref 78.0–100.0)
MONO ABS: 1.3 10*3/uL — AB (ref 0.1–1.0)
Monocytes Relative: 14 %
NEUTROS ABS: 5.9 10*3/uL (ref 1.7–7.7)
NEUTROS PCT: 63 %
Platelets: 434 10*3/uL — ABNORMAL HIGH (ref 150–400)
RBC: 3.47 MIL/uL — AB (ref 3.87–5.11)
RDW: 13.8 % (ref 11.5–15.5)
WBC: 9.2 10*3/uL (ref 4.0–10.5)

## 2017-02-05 LAB — BASIC METABOLIC PANEL
ANION GAP: 9 (ref 5–15)
BUN: 22 mg/dL — ABNORMAL HIGH (ref 6–20)
CO2: 26 mmol/L (ref 22–32)
Calcium: 9.2 mg/dL (ref 8.9–10.3)
Chloride: 104 mmol/L (ref 101–111)
Creatinine, Ser: 0.64 mg/dL (ref 0.44–1.00)
GLUCOSE: 98 mg/dL (ref 65–99)
POTASSIUM: 4.4 mmol/L (ref 3.5–5.1)
Sodium: 139 mmol/L (ref 135–145)

## 2017-02-05 MED ORDER — OXYCODONE-ACETAMINOPHEN 5-325 MG PO TABS
1.0000 | ORAL_TABLET | Freq: Once | ORAL | Status: AC
  Administered 2017-02-05: 1 via ORAL
  Filled 2017-02-05: qty 1

## 2017-02-05 NOTE — ED Provider Notes (Signed)
[place] DEPT Provider Note   CSN: [id number] Arrival date & time: 02/05/17  1548     History   Chief Complaint Chief Complaint  Patient presents with  . Post-op Problem    HPI Felicia Cain is a 61 y.o. female.  HPI  Pt was seen at 62. Per pt, c/o gradual onset and worsening of constant right hip "pain" for the past 2 days. Pt was evaluated in her Ortho MD office 2 days ago for these symptoms, but states "they're getting worse." Pt states "it is getting harder to walk" due to increasing pain. Pt is not on anticoagulant or antiplatelet meds. Pt states she called her Ortho MD and was told to come to [facility] ED for evaluation. Denies focal motor weakness, no falls/injury, no open wounds, no fevers, no erythema.   Past Medical History:  Diagnosis Date  . Anxiety   . Arthritis    "back" (01/25/2017)  . Brachial neuritis    neuropathy  . Chronic lower back pain   . DCIS (ductal carcinoma in situ)    "left side?"  . Esophageal stricture    stricture with dysphasia  . Fibromyalgia   . GERD (gastroesophageal reflux disease)   . H/O Doppler ultrasound 2006   see scanned study  . H/O echocardiogram 2004, 2013  . Hepatitis A 1961   [place]idem[place]"  . History of cardiac monitoring 2006  . History of nuclear stress test 2004   see scanned study  . Migraine    "in the past; cycle related" (01/25/2017)  . Myasthenia gravis in crisis [facility]) 04/2014   respiratory crisis  . NAION (non-arteritic anterior ischemic optic neuropathy), right   . Near syncope   . Neuromuscular disorder ([place])   . Neuropathy   . Optic neuritis    ischaemic optic neuritis, non arteric  . Optic neuropathy   . PONV (postoperative nausea and vomiting)   . Posterior optic neuritis   . Ptosis of eyelid   . Sleep apnea    "I wear Moses device; I don't wear CPAP" (01/25/2017)  . Spinal stenosis     Patient Active Problem List   Diagnosis Date Noted  . Hip fracture ([place]) 01/25/2017  . Closed  left hip fracture ([place]) 01/25/2017  . Closed hip fracture, right, initial encounter ([place])   . Preop examination   . Fibromyalgia   . Neuropathy   . Corneal abrasion   . Nausea   . Fall   . SIRS (systemic inflammatory response syndrome) ([place]) 07/19/2015  . Seronegative myasthenia gravis ([place]) 07/19/2015  . Acute exacerbation of chronic low back pain 07/19/2015  . Leukocytosis 07/19/2015  . Myasthenia gravis in remission ([place]) 10/23/2014  . OSA on CPAP 10/23/2014  . Hypersomnia, persistent 10/23/2014  . Neurogenic hypoventilation 08/14/2014  . Hypoxemia 08/14/2014  . Myasthenia gravis with exacerbation ([place]) 08/14/2014  . Myasthenia gravis with exacerbation, ocular ([place]) 12/08/2012  . Right shoulder pain 08/02/2012  . 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  . History of optic neuritis 06/27/2012  . Rosacea 06/27/2012  . S/P laminectomy 06/27/2012  . 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 S/P bilateral mastectomy 06/26/2012  . BRCA negative 06/26/2012  . GERD (gastroesophageal reflux disease) 06/26/2012  . Menopause 06/26/2012  . Ankle pain 06/02/2012    Past Surgical History:  Procedure Laterality Date  . AUGMENTATION MAMMAPLASTY     2000, after breast cancer surgery,redone 2010  . BACK SURGERY    . BREAST BIOPSY    .  CARPAL TUNNEL RELEASE Bilateral   . CESAREAN SECTION  1989  . ESOPHAGOGASTRODUODENOSCOPY (EGD) WITH ESOPHAGEAL DILATION    . HIP PINNING,CANNULATED Right 01/26/2017   Procedure: CANNULATED SCREWS/RIGHT HIP PINNING;  Surgeon: Mcarthur Rossetti, MD;  Location: [place];  Service: Orthopedics;  Laterality: Right;  . INNER EAR SURGERY Right 1995  . LUMBAR LAMINECTOMY Right 1999   [name]  . MASTECTOMY Bilateral     OB History    Gravida Para Term Preterm AB Living   _0 SAB TAB Ectopic Multiple Live Births   1               Home Medications    Prior to Admission medications   Medication Sig Start Date End Date Taking? Authorizing  Provider  aspirin 325 MG tablet Take 1 tablet (325 mg total) by mouth daily. Patient not taking: Reported on 02/04/2017 01/29/17   Elgergawy, Silver Huguenin, MD  cholecalciferol (VITAMIN D) 1000 UNITS tablet Take 5,000 Units by mouth daily.     [provider]  doxycycline (VIBRAMYCIN) 100 MG capsule Take 100 mg by mouth daily.  05/16/12   [provider]  ferrous sulfate 325 (65 FE) MG tablet Take 1 tablet (325 mg total) by mouth 2 (two) times daily with a meal. Patient not taking: Reported on 02/04/2017 01/29/17   Elgergawy, Silver Huguenin, MD  gabapentin (NEURONTIN) 300 MG capsule Take 300 mg by mouth every morning.    [provider]  ibuprofen (ADVIL,MOTRIN) 200 MG tablet Take 200 mg by mouth every 6 (six) hours as needed.    [provider]  methocarbamol (ROBAXIN) 500 MG tablet Take 1 tablet (500 mg total) by mouth every 6 (six) hours as needed for muscle spasms. 01/27/17   Mcarthur Rossetti, MD  Multiple Vitamins-Minerals (PRESERVISION AREDS 2 PO) Take 1 capsule by mouth 2 (two) times daily.    [provider]  ondansetron (ZOFRAN) 4 MG tablet Take 1 tablet (4 mg total) by mouth every 6 (six) hours as needed for nausea. Patient not taking: Reported on 02/04/2017 01/29/17   Elgergawy, Silver Huguenin, MD  oxycodone (OXY-IR) 5 MG capsule Take 1 capsule (5 mg total) by mouth every 6 (six) hours as needed. 02/03/17   Mcarthur Rossetti, MD  pantoprazole (PROTONIX) 40 MG tablet Take 1 tablet (40 mg total) by mouth daily at 6 (six) AM. Patient not taking: Reported on 02/04/2017 01/30/17   Elgergawy, Silver Huguenin, MD    Family History Family History  Problem Relation Age of Onset  . Stroke Mother   . Hypertension Mother   . Early death Father   . Cancer Father 24       sarcoma  . Parkinson's disease Maternal Aunt   . Cancer Maternal Uncle   . Cancer Paternal Grandmother        breast    Social History Social History  Substance Use Topics  . Smoking status:  Never Smoker  . Smokeless tobacco: Never Used  . Alcohol use No     Allergies   Tape; Sulfa antibiotics; Adderall [amphetamine-dextroamphetamine]; and Latex   Review of Systems Review of Systems ROS: Statement: All systems negative except as marked or noted in the HPI; Constitutional: Negative for fever and chills. ; ; Eyes: Negative for eye pain, redness and discharge. ; ; ENMT: Negative for ear pain, hoarseness, nasal congestion, sinus pressure and sore throat. ; ; Cardiovascular: Negative for chest  pain, palpitations, diaphoresis, dyspnea and peripheral edema. ; ; Respiratory: Negative for cough, wheezing and stridor. ; ; Gastrointestinal: Negative for nausea, vomiting, diarrhea, abdominal pain, blood in stool, hematemesis, jaundice and rectal bleeding. . ; ; Genitourinary: Negative for dysuria, flank pain and hematuria. ; ; Musculoskeletal: +right hip pain. Negative for back pain and neck pain. Negative for swelling and trauma.; ; Skin: +bruising, swelling. Negative for pruritus, rash, abrasions, blisters, and skin lesion.; ; Neuro: Negative for headache, lightheadedness and neck stiffness. Negative for weakness, altered level of consciousness, altered mental status, extremity weakness, paresthesias, involuntary movement, seizure and syncope.      Physical Exam Updated Vital Signs BP 116/75 (BP Location: Left Arm)   Pulse 82   Temp 98.6 F (37 C) (Oral)   Resp 16   SpO2 95%   Physical Exam 1630: Physical examination:  Nursing notes reviewed; Vital signs and O2 SAT reviewed;  Constitutional: Well developed, Well nourished, Well hydrated, In no acute distress; Head:  Normocephalic, atraumatic; Eyes: EOMI, PERRL, No scleral icterus; ENMT: Mouth and pharynx normal, Mucous membranes moist; Neck: Supple, Full range of motion, No lymphadenopathy; Cardiovascular: Regular rate and rhythm, No gallop; Respiratory: Breath sounds clear & equal bilaterally, No wheezes.  Speaking full sentences with  ease, Normal respiratory effort/excursion; Chest: Nontender, Movement normal; Abdomen: Soft, Nontender, Nondistended, Normal bowel sounds; Genitourinary: No CVA tenderness; Extremities: Pulses normal, pelvis stable. +TTP right hip with localized edema, ecchymosis and scattered ecchymosis down entire RLE. No erythema. Surgical site lateral right hip with steri-strips intact, clean/dry, no erythema, no drainage, no open wounds. No calf edema or asymmetry. Strong pedal pulses. RLE muscles compartments soft.; Neuro: AA&Ox3, Major CN grossly intact.  Speech clear. No gross focal motor or sensory deficits in extremities.; Skin: Color normal, Warm, Dry.   ED Treatments / Results  Labs (all labs ordered are listed, but only abnormal results are displayed)   EKG  EKG Interpretation None       Radiology   Procedures Procedures (including critical care time)  Medications Ordered in ED Medications  oxyCODONE-acetaminophen (PERCOCET/ROXICET) 5-325 MG per tablet 1 tablet (1 tablet Oral Given 02/05/17 1658)     Initial Impression / Assessment and Plan / ED Course  I have reviewed the triage vital signs and the nursing notes.  Pertinent labs & imaging results that were available during my care of the patient were reviewed by me and considered in my medical decision making (see chart for details).  MDM Reviewed: nursing note, vitals and previous chart Reviewed previous: labs and x-ray Interpretation: labs, ultrasound and x-ray   Results for orders placed or performed during the hospital encounter of 95/18/84  Basic metabolic panel  Result Value Ref Range   Sodium 139 135 - 145 mmol/L   Potassium 4.4 3.5 - 5.1 mmol/L   Chloride 104 101 - 111 mmol/L   CO2 26 22 - 32 mmol/L   Glucose, Bld 98 65 - 99 mg/dL   BUN 22 (H) 6 - 20 mg/dL   Creatinine, Ser 0.64 0.44 - 1.00 mg/dL   Calcium 9.2 8.9 - 10.3 mg/dL   GFR calc non Af Amer >60 >60 mL/min   GFR calc Af Amer >60 >60 mL/min   Anion gap 9 5  - 15  CBC with Differential  Result Value Ref Range   WBC 9.2 4.0 - 10.5 K/uL   RBC 3.47 (L) 3.87 - 5.11 MIL/uL   Hemoglobin 10.3 (L) 12.0 - 15.0 g/dL   HCT 31.4 (L)  36.0 - 46.0 %   MCV 90.5 78.0 - 100.0 fL   MCH 29.7 26.0 - 34.0 pg   MCHC 32.8 30.0 - 36.0 g/dL   RDW 13.8 11.5 - 15.5 %   Platelets 434 (H) 150 - 400 K/uL   Neutrophils Relative % 63 %   Neutro Abs 5.9 1.7 - 7.7 K/uL   Lymphocytes Relative 19 %   Lymphs Abs 1.7 0.7 - 4.0 K/uL   Monocytes Relative 14 %   Monocytes Absolute 1.3 (H) 0.1 - 1.0 K/uL   Eosinophils Relative 3 %   Eosinophils Absolute 0.3 0.0 - 0.7 K/uL   Basophils Relative 1 %   Basophils Absolute 0.1 0.0 - 0.1 K/uL    Dg Hip Unilat With Pelvis 2-3 Views Right Result Date: 02/05/2017 CLINICAL DATA:  Pain and swelling of right hip and status post recent right femoral neck fracture with placement of cannulated screws on 01/26/2017. EXAM: DG HIP (WITH OR WITHOUT PELVIS) 2-3V RIGHT COMPARISON:  01/28/2017 FINDINGS: Alignment of the proximal right femur is stable and anatomic with no evidence of displacement at the level of previous fracture. The cannulated screws show stable positioning without retraction. No abnormal lucency is seen surrounding the screws. No significant impaction identified. The hip joint shows normal alignment. No bony destruction or significant arthropathy. IMPRESSION: Gross anatomic alignment at the level of recently treated femoral neck fracture. Cannulated screw shows stable positioning. Electronically Signed   By: Aletta Edouard M.D.   On: 02/05/2017 17:24    VASCULAR LAB PRELIMINARY  PRELIMINARY  PRELIMINARY  PRELIMINARY Right lower extremity venous duplex completed.   Preliminary report:  Right:  No evidence of DVT, superficial thrombosis, or Baker's cyst. SLAUGHTER, VIRGINIA, RVT 02/05/2017, 8:12 PM   Results for REGINIA, BATTIE (MRN [id number]) as of 02/05/2017 20:15  Ref. Range 01/26/2017 06:50 01/27/2017 08:07 01/28/2017 03:45  01/29/2017 06:01 02/05/2017 16:52  Hemoglobin Latest Ref Range: 12.0 - 15.0 g/dL 12.7 10.7 (L) 9.6 (L) 9.7 (L) 10.3 (L)  HCT Latest Ref Range: 36.0 - 46.0 % 37.5 32.7 (L) 29.5 (L) 29.3 (L) 31.4 (L)    1640:  Vasc US ordered. T/C to Ortho Dr. Ninfa Linden, case discussed, including:  HPI, pertinent PM/SHx, VS/PE, dx testing, ED course and treatment:  Agreeable to come to ED for evaluation, requests OK to repeat XR (had hip XR 2 days ago in office) and labs.  Will also medicate for pain. Dx and testing, as well as d/w Ortho MD, d/w pt and family.  Questions answered.  Verb understanding, agreeable to plan.   1720:  Ortho MD has come to the ED for evaluation:  No acute process at this time, pt can be d/c to f/u in office after workup completed.   2025:  H/H increasing since hospital discharge. Workup reassuring. No clear indication for admission at this time. Dx and testing d/w pt and family.  Questions answered.  Verb understanding, agreeable to d/c home with outpt f/u.    Final Clinical Impressions(s) / ED Diagnoses   Final diagnoses:  None    New Prescriptions New Prescriptions   No medications on file     Francine Graven, DO 02/08/17 2108

## 2017-02-05 NOTE — Discharge Instructions (Signed)
Take your usual prescriptions as previously directed.  Apply moist heat or ice to the area(s) of discomfort, for 15 minutes at a time, several times per day for the next few days.  Do not fall asleep on a heating or ice pack.  Call your regular medical doctor and your Orthopedist on Monday to schedule a follow up appointment within the next 3 to 4 days.  Return to the Emergency Department immediately if worsening.

## 2017-02-05 NOTE — ED Triage Notes (Signed)
Pt recently had surgery for hip fracture. She is now c/o increased swelling and pain. Making it difficult to walk. She sees Dr Rush Farmer and spoke to him ans he said he is here today and would come see her. A&Ox4.

## 2017-02-05 NOTE — Progress Notes (Signed)
VASCULAR LAB PRELIMINARY  PRELIMINARY  PRELIMINARY  PRELIMINARY  Right lower extremity venous duplex completed.    Preliminary report:  Right:  No evidence of DVT, superficial thrombosis, or Baker's cyst.  Kelena Garrow, RVT 02/05/2017, 8:12 PM

## 2017-02-08 NOTE — Addendum Note (Signed)
Addendum  created 02/08/17 1549 by Oleta Mouse, MD   Sign clinical note

## 2017-02-09 ENCOUNTER — Telehealth (INDEPENDENT_AMBULATORY_CARE_PROVIDER_SITE_OTHER): Payer: Self-pay

## 2017-02-09 DIAGNOSIS — L905 Scar conditions and fibrosis of skin: Secondary | ICD-10-CM | POA: Diagnosis not present

## 2017-02-09 DIAGNOSIS — Z411 Encounter for cosmetic surgery: Secondary | ICD-10-CM | POA: Diagnosis not present

## 2017-02-09 DIAGNOSIS — D485 Neoplasm of uncertain behavior of skin: Secondary | ICD-10-CM | POA: Diagnosis not present

## 2017-02-09 DIAGNOSIS — L719 Rosacea, unspecified: Secondary | ICD-10-CM | POA: Diagnosis not present

## 2017-02-09 DIAGNOSIS — M79604 Pain in right leg: Secondary | ICD-10-CM | POA: Diagnosis not present

## 2017-02-09 MED FILL — DOXYCYCLINE HYC 50 MG CAP: 50 | 30 days supply | Qty: 60 | Fill #0

## 2017-02-09 MED FILL — AMOX TR-K CLV 875-125 MG TA: 875-125 | 7 days supply | Qty: 14 | Fill #0

## 2017-02-09 NOTE — Telephone Encounter (Signed)
Patient would like a return call from Dr. Ninfa Linden.  CB# is [phone].

## 2017-02-09 NOTE — Telephone Encounter (Signed)
She would like to speak with you only

## 2017-02-10 MED FILL — traMADol HCL 50 MG TABS: 50 | 15 days supply | Qty: 60 | Fill #0

## 2017-02-16 DIAGNOSIS — G7000 Myasthenia gravis without (acute) exacerbation: Secondary | ICD-10-CM | POA: Diagnosis not present

## 2017-02-16 DIAGNOSIS — G4733 Obstructive sleep apnea (adult) (pediatric): Secondary | ICD-10-CM | POA: Diagnosis not present

## 2017-02-16 DIAGNOSIS — F419 Anxiety disorder, unspecified: Secondary | ICD-10-CM | POA: Diagnosis not present

## 2017-02-16 DIAGNOSIS — G629 Polyneuropathy, unspecified: Secondary | ICD-10-CM | POA: Diagnosis not present

## 2017-02-16 DIAGNOSIS — S7291XD Unspecified fracture of right femur, subsequent encounter for closed fracture with routine healing: Secondary | ICD-10-CM | POA: Diagnosis not present

## 2017-02-16 DIAGNOSIS — M25511 Pain in right shoulder: Secondary | ICD-10-CM | POA: Diagnosis not present

## 2017-02-16 DIAGNOSIS — F329 Major depressive disorder, single episode, unspecified: Secondary | ICD-10-CM | POA: Diagnosis not present

## 2017-02-16 DIAGNOSIS — M797 Fibromyalgia: Secondary | ICD-10-CM | POA: Diagnosis not present

## 2017-02-17 ENCOUNTER — Telehealth (INDEPENDENT_AMBULATORY_CARE_PROVIDER_SITE_OTHER): Payer: Self-pay | Admitting: Orthopaedic Surgery

## 2017-02-17 NOTE — Telephone Encounter (Signed)
I called to advise ok to hold on PT until patient can begin weightbearing, s/p hip fracture.

## 2017-02-17 NOTE — Telephone Encounter (Signed)
Felicia Cain from [facility] called asking for verbal orders to put the home health therapy on hold until she's able to be weight baring. CB# [phone]

## 2017-02-18 ENCOUNTER — Ambulatory Visit (HOSPITAL_COMMUNITY)
Admission: RE | Admit: 2017-02-18 | Discharge: 2017-02-18 | Disposition: A | Discharge status: Home / Self Care | Payer: 59 | Source: Ambulatory Visit | Attending: Vascular Surgery | Admitting: Vascular Surgery

## 2017-02-18 ENCOUNTER — Other Ambulatory Visit (HOSPITAL_COMMUNITY): Payer: Self-pay | Admitting: Family Medicine

## 2017-02-18 DIAGNOSIS — M79671 Pain in right foot: Secondary | ICD-10-CM | POA: Diagnosis not present

## 2017-02-18 DIAGNOSIS — I8311 Varicose veins of right lower extremity with inflammation: Secondary | ICD-10-CM | POA: Diagnosis not present

## 2017-02-18 DIAGNOSIS — M7981 Nontraumatic hematoma of soft tissue: Secondary | ICD-10-CM | POA: Diagnosis not present

## 2017-02-18 DIAGNOSIS — M79604 Pain in right leg: Secondary | ICD-10-CM | POA: Diagnosis not present

## 2017-02-24 ENCOUNTER — Ambulatory Visit (INDEPENDENT_AMBULATORY_CARE_PROVIDER_SITE_OTHER): Payer: 59 | Admitting: Orthopaedic Surgery

## 2017-02-24 ENCOUNTER — Ambulatory Visit (INDEPENDENT_AMBULATORY_CARE_PROVIDER_SITE_OTHER): Payer: 59

## 2017-02-24 ENCOUNTER — Encounter (INDEPENDENT_AMBULATORY_CARE_PROVIDER_SITE_OTHER): Payer: Self-pay | Admitting: Orthopaedic Surgery

## 2017-02-24 VITALS — Ht 66.0 in | Wt 149.0 lb

## 2017-02-24 DIAGNOSIS — M25551 Pain in right hip: Secondary | ICD-10-CM

## 2017-02-24 DIAGNOSIS — S72001A Fracture of unspecified part of neck of right femur, initial encounter for closed fracture: Secondary | ICD-10-CM

## 2017-02-24 MED ORDER — OXYCODONE HCL 5 MG PO TABS: 5.0000 mg | ORAL_TABLET | Freq: Three times a day (TID) | ORAL | 0 refills | Status: DC | PRN

## 2017-02-24 MED FILL — oxyCODONE HCL 5 MG TABS: 5 | 10 days supply | Qty: 60 | Fill #0

## 2017-02-24 NOTE — Progress Notes (Signed)
The patient is now 4 weeks out from cannulated screw fixation of a nondisplaced right hip femoral neck fracture. She family with a walker and is been compliant with nonweightbearing on her right hip. She still having daily pain is to be expected she is otherwise a healthy 61 year old. She is seeing a dermatologist and had a biopsy to check if she had vasculitis. She wonders I can remove her suture from that area today and I agree with the  On exam his right hip incision there is a retained sutures are removed I did remove her distal suture from her biopsy as well. She has pain with interaction rotation of her right hip to be expected but it is definitely last and she is tolerating it more.  AP pelvis lateral right hip show a nondisplaced right hip femoral neck fracture with intact hardware with no, getting features and some interval healing. The alignment is anatomic.  At this point she'll increase to weightbearing as tolerated using a cane in her opposite hand. 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 I did refill her oxycodone. Once see her back in 4 weeks I would like just an AP and lateral for 2 views of the right hip only we do not need to see the pelvis

## 2017-03-03 MED FILL — GABAPENTIN 300 MG CAPSULE: 300 | 30 days supply | Qty: 180 | Fill #0

## 2017-03-11 ENCOUNTER — Telehealth: Payer: Self-pay | Admitting: Neurology

## 2017-03-11 MED ORDER — DESVENLAFAXINE SUCCINATE ER 50 MG PO TB24: 50.0000 mg | ORAL_TABLET | Freq: Every day | ORAL | 5 refills | Status: DC

## 2017-03-11 MED FILL — DOXYCYCLINE HYC 50 MG CAP: 50 | 30 days supply | Qty: 30 | Fill #4

## 2017-03-11 NOTE — Telephone Encounter (Signed)
More tearful, requested refill of Pristiq

## 2017-03-12 MED FILL — DESVENLAFAXINE SUC ER 50 MG: 50 | 30 days supply | Qty: 30 | Fill #0

## 2017-03-22 DIAGNOSIS — M5416 Radiculopathy, lumbar region: Secondary | ICD-10-CM | POA: Diagnosis not present

## 2017-03-22 DIAGNOSIS — M129 Arthropathy, unspecified: Secondary | ICD-10-CM | POA: Diagnosis not present

## 2017-03-22 DIAGNOSIS — M545 Low back pain: Secondary | ICD-10-CM | POA: Diagnosis not present

## 2017-03-22 DIAGNOSIS — M4316 Spondylolisthesis, lumbar region: Secondary | ICD-10-CM | POA: Diagnosis not present

## 2017-03-22 MED FILL — AVENOVA LID & LASH SPRAY: 0.01 | 30 days supply | Qty: 40 | Fill #0

## 2017-03-24 ENCOUNTER — Encounter (INDEPENDENT_AMBULATORY_CARE_PROVIDER_SITE_OTHER): Payer: Self-pay | Admitting: Orthopaedic Surgery

## 2017-03-24 ENCOUNTER — Ambulatory Visit (INDEPENDENT_AMBULATORY_CARE_PROVIDER_SITE_OTHER): Payer: 59

## 2017-03-24 ENCOUNTER — Ambulatory Visit (INDEPENDENT_AMBULATORY_CARE_PROVIDER_SITE_OTHER): Payer: 59 | Admitting: Orthopaedic Surgery

## 2017-03-24 DIAGNOSIS — S72002D Fracture of unspecified part of neck of left femur, subsequent encounter for closed fracture with routine healing: Secondary | ICD-10-CM

## 2017-03-24 MED FILL — TRETINOIN GEL MICRO 0.1% PU: 0.1 | 30 days supply | Qty: 50 | Fill #0

## 2017-03-24 MED FILL — metroNIDAZOLE 1 % GEL: 1 | 30 days supply | Qty: 55 | Fill #0

## 2017-03-24 NOTE — Addendum Note (Signed)
Addended by: Brand Males E on: 03/24/2017 12:01 PM   Modules accepted: Orders

## 2017-03-24 NOTE — Progress Notes (Signed)
The patient is now close to 8 weeks status post cannulated screw fixation of a right femoral neck fracture. We let her start weightbearing as tolerated but she is having troubles with a cane and goes back and forth between a cane and a walker. She is seen Dr. Vertell Limber recently from neurosurgery and apparently there is some spondylolisthesis on plain films of her lumbar spine. She is also having bilateral lower extremity radicular symptoms and she said he will likely order an MRI to further evaluate this. She is interested in outpatient physical therapy to work on balance and gait training coordination and other modalities to help her pain subsided.  On examination flexion-extension causes minimal discomfort but internal rotation rotation still causes significant amount of pain on examination of the right hip.  X-rays show intact hardware of the right hip with 3 cannulated screws. There is been slight interval healing. There is no collapse of the femoral head and evidence of osteonecrosis. There is no displacement of fracture.  I turned that she is heading in the right direction. She certainly still has pain associated with the fracture. The radicular symptoms are not related to her fracture though. We'll work on setting her for outpatient physical therapy and we'll see her back in 4 weeks with an AP and lateral of her right hip

## 2017-03-25 ENCOUNTER — Telehealth (INDEPENDENT_AMBULATORY_CARE_PROVIDER_SITE_OTHER): Payer: Self-pay | Admitting: Orthopaedic Surgery

## 2017-03-25 ENCOUNTER — Other Ambulatory Visit (HOSPITAL_COMMUNITY): Payer: Self-pay | Admitting: Neurosurgery

## 2017-03-25 DIAGNOSIS — M5416 Radiculopathy, lumbar region: Secondary | ICD-10-CM

## 2017-03-25 DIAGNOSIS — F4321 Adjustment disorder with depressed mood: Secondary | ICD-10-CM | POA: Diagnosis not present

## 2017-03-25 NOTE — Telephone Encounter (Signed)
Patient called advised Dr. Geroge Baseman is ordering an MRI for her. Patient want to know if it is ok for her to have an MRI with the screws in her hip. The doctor is scheduling the MRI very soon. The number to contact patient is [phone]

## 2017-03-25 NOTE — Telephone Encounter (Signed)
IC advised ok.  

## 2017-03-26 ENCOUNTER — Other Ambulatory Visit: Payer: Self-pay | Admitting: Neurosurgery

## 2017-03-26 DIAGNOSIS — M5416 Radiculopathy, lumbar region: Secondary | ICD-10-CM

## 2017-03-29 DIAGNOSIS — F4321 Adjustment disorder with depressed mood: Secondary | ICD-10-CM | POA: Diagnosis not present

## 2017-04-01 ENCOUNTER — Other Ambulatory Visit (INDEPENDENT_AMBULATORY_CARE_PROVIDER_SITE_OTHER): Payer: Self-pay

## 2017-04-01 ENCOUNTER — Ambulatory Visit: Payer: 59 | Attending: Orthopaedic Surgery

## 2017-04-01 DIAGNOSIS — M6281 Muscle weakness (generalized): Secondary | ICD-10-CM | POA: Insufficient documentation

## 2017-04-01 DIAGNOSIS — M25551 Pain in right hip: Secondary | ICD-10-CM

## 2017-04-01 DIAGNOSIS — R2689 Other abnormalities of gait and mobility: Secondary | ICD-10-CM | POA: Diagnosis not present

## 2017-04-01 DIAGNOSIS — M25651 Stiffness of right hip, not elsewhere classified: Secondary | ICD-10-CM | POA: Diagnosis not present

## 2017-04-01 DIAGNOSIS — F4322 Adjustment disorder with anxiety: Secondary | ICD-10-CM | POA: Diagnosis not present

## 2017-04-01 NOTE — Therapy (Signed)
[facility] [address] [place], [place], [address] Phone: [phone]   Fax:  [phone]  Physical Therapy Evaluation  Patient Details  Name: Felicia Cain MRN: [id number] Date of Birth: 1955/12/16 Referring Provider: Jean Rosenthal, MD  Encounter Date: 04/01/2017      PT End of Session - 04/01/17 1226    Visit Number 1   Date for PT Re-Evaluation 05/27/17   PT Start Time 1146   PT Stop Time 1226   PT Time Calculation (min) 40 min   Activity Tolerance Patient tolerated treatment well   Behavior During Therapy [facility] for tasks assessed/performed      Past Medical History:  Diagnosis Date  . Anxiety   . Arthritis    "back" (01/25/2017)  . Brachial neuritis    neuropathy  . Chronic lower back pain   . DCIS (ductal carcinoma in situ)    "left side?"  . Esophageal stricture    stricture with dysphasia  . Fibromyalgia   . GERD (gastroesophageal reflux disease)   . H/O Doppler ultrasound 2006   see scanned study  . H/O echocardiogram 2004, 2013  . Hepatitis A 1961   [place]"  . History of cardiac monitoring 2006  . History of nuclear stress test 2004   see scanned study  . Migraine    "in the past; cycle related" (01/25/2017)  . Myasthenia gravis in crisis [facility]) 04/2014   respiratory crisis  . NAION (non-arteritic anterior ischemic optic neuropathy), right   . Near syncope   . Neuromuscular disorder ([place])   . Neuropathy   . Optic neuritis    ischaemic optic neuritis, non arteric  . Optic neuropathy   . PONV (postoperative nausea and vomiting)   . Posterior optic neuritis   . Ptosis of eyelid   . Sleep apnea    "I wear Moses device; I don't wear CPAP" (01/25/2017)  . Spinal stenosis     Past Surgical History:  Procedure Laterality Date  . AUGMENTATION MAMMAPLASTY     2000, after breast cancer surgery,redone 2010  . BACK SURGERY    . BREAST BIOPSY    . CARPAL TUNNEL  RELEASE Bilateral   . CESAREAN SECTION  1989  . ESOPHAGOGASTRODUODENOSCOPY (EGD) WITH ESOPHAGEAL DILATION    . HIP PINNING,CANNULATED Right 01/26/2017   Procedure: CANNULATED SCREWS/RIGHT HIP PINNING;  Surgeon: Mcarthur Rossetti, MD;  Location: [place];  Service: Orthopedics;  Laterality: Right;  . INNER EAR SURGERY Right 1995  . LUMBAR LAMINECTOMY Right 1999   [name]  . MASTECTOMY Bilateral     There were no vitals filed for this visit.       Subjective Assessment - 04/01/17 1132    Subjective Pt presents to PT s/p Rt hip fracture with pinning surgery performed 01/26/17.  Pt is here for strength, gait and flexibility.  Pt with onset of LBP and Lt LE radiculoapthy and will have MRI next week.     Pertinent History Rt hip fracture with pin placement: 01/26/17   Limitations Walking   How long can you stand comfortably? 10 minutes   How long can you walk comfortably? <5 minutes    Patient Stated Goals improve Rt hip strength, walk without cane, walk longer  so I can return to work   Currently in Pain? Yes   Pain Score 4    Pain Location Hip   Pain Orientation Right   Pain Descriptors / Indicators Sore  Pain Type Surgical pain   Pain Onset More than a month ago   Pain Frequency Constant   Aggravating Factors  sitting too long, walking   Pain Relieving Factors stretching, rest            [facility] PT Assessment - 04/01/17 0001      Assessment   Medical Diagnosis closed fracture of Rt hip with routine healing   Referring Provider Jean Rosenthal, MD   Onset Date/Surgical Date 01/26/17  surgery.  01/24/17   Next MD Visit 05/03/17   Prior Therapy home health      Precautions   Precautions Other (comment)  history of breast cancer     Restrictions   Weight Bearing Restrictions No     Balance Screen   Has the patient fallen in the past 6 months Yes   How many times? 1  in [place]- stepped wrong outside   Has the patient had a decrease in activity level  because of a fear of falling?  No   Is the patient reluctant to leave their home because of a fear of falling?  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     Cognition   Overall Cognitive Status Within Functional Limits for tasks assessed     Observation/Other Assessments   Focus on Therapeutic Outcomes (FOTO)  62% limitation     Posture/Postural Control   Posture/Postural Control No significant limitations     ROM / Strength   AROM / PROM / Strength AROM;PROM;Strength     AROM   Overall AROM  Deficits;Due to pain   Overall AROM Comments not able to assess Rt hip A/ROM due to Rt hip flexor pain that radiates to Rt ankle     PROM   Overall PROM  Deficits;Due to pain     Strength   Overall Strength Deficits   Overall Strength Comments Lt LE 4/5 hip and 4+/5 knee.  Pt with Lt LE pain due to radiculopathy.  Lt hip strength: flexion 3/5 tested in sitting, abdution not texted due to inability to perform Rt sidelying.  IR/ER 4/5, knee 4+/5, ankle 5/5     Palpation   Palpation comment surgical incision is well healing.  Pt with hypersensitivity to Rt groin, hip flexor and Rt quad.       Transfers   Transfers Sit to Stand;Stand to Sit   Sit to Stand With upper extremity assist   Stand to Sit With upper extremity assist     Ambulation/Gait   Ambulation/Gait Yes   Ambulation/Gait Assistance 6: Modified independent (Device/Increase time)   Assistive device Straight cane   Gait Pattern  Step-through pattern;Decreased stance time - right;Decreased step length - right;Antalgic   Stairs Yes   Stairs Assistance 6: Modified independent (Device/Increase time)   Stair Management Technique Step to pattern            Objective measurements completed on examination: See above findings.  [place] Adult PT Treatment/Exercise - 04/01/17 0001      Exercises   Exercises Lumbar;Knee/Hip                PT Education - 04/01/17 1218    Education provided Yes   Education Details hip flexor stretch, seated long arc quads and marching, use of roller for fascia release   Person(s) Educated Patient   Methods Explanation;Demonstration;Handout   Comprehension Verbalized understanding;Returned demonstration          PT Short Term Goals - 04/01/17 1134      PT SHORT TERM GOAL #1   Title be independent in initial HEP   Time 4   Period Weeks   Status New     PT SHORT TERM GOAL #2   Title demonstrate symmetry with ambulation on level surface with use of cane   Time 4   Period Weeks   Status New     PT SHORT TERM GOAL #3   Title report < or = to 3/10 Rt hip pain with standing and walking   Time 4   Period Weeks   Status New     PT SHORT TERM GOAL #4   Title improve strength and reduce pain to walk for 10 minutes without rest   Time 4   Period Weeks   Status New           PT Long Term Goals - 04/01/17 1135      PT LONG TERM GOAL #1   Title be independent in advanced HEP   Time 8   Period Weeks   Status New     PT LONG TERM GOAL #2   Title reduce FOTO to < or = to 44% limitation   Time 8   Period Weeks   Status New     PT LONG TERM GOAL #3   Title stand and walk for 30 minutes without rest    Time 8   Period Weeks   Status New     PT LONG TERM GOAL #4   Title demonstrate 4+/5 Rt hip flexor strength to improve mobility and transitions   Time 8   Period Weeks   Status New     PT LONG TERM GOAL #5   Title report a 75% reduction  in Rt LE pain with standing and walking   Time 8   Period Weeks   Status New     Additional Long Term Goals   Additional Long Term Goals Yes     PT LONG TERM GOAL #6   Title improve strength to negotiate steps with step-over-step gait   Time 8   Period Weeks   Status New                Plan - 04/01/17 1259    Clinical Impression Statement Pt presents to PT s/p Rt hip fracture due to fall.  Pt had surgery 01/26/17 to pin Rt hip. Pt is now WBAT.  Pt with new onset of LBP and Lt LE radiculopathy and will have MRI soon.  Pt demonstrates Rt hip weakness, painful and limited flexibility, antalgic gait and hypersensitivity over Rt groin, hip flexors and quad.  Pt will benefit from skilled PT for flexibility, gait, core strength, balance and endurance.     History and Personal Factors relevant to plan of care: none   Clinical Presentation Stable   Clinical Decision Making Low   Rehab Potential Good   PT Frequency 2x /  week   PT Duration 8 weeks   PT Treatment/Interventions ADLs/Self Care Home Management;Cryotherapy;Electrical Stimulation;Moist Heat;Ultrasound;Gait training;Stair training;Functional mobility training;Therapeutic activities;Therapeutic exercise;Neuromuscular re-education;Passive range of motion;Manual techniques;Dry needling;Taping;Balance training   PT Next Visit Plan Gait training, manual to Rt hip flexor and quads, gentle strength/mobility, NuStep   Consulted and Agree with Plan of Care Patient      Patient will benefit from skilled therapeutic intervention in order to improve the following deficits and impairments:  Abnormal gait, Decreased range of motion, Difficulty walking, Pain, Decreased mobility, Decreased strength, Decreased scar mobility, Impaired flexibility, Improper body mechanics, Decreased activity tolerance, Increased muscle spasms, Decreased endurance  Visit Diagnosis: Muscle weakness (generalized) - Plan: PT plan of care cert/re-cert  Other  abnormalities of gait and mobility - Plan: PT plan of care cert/re-cert  Stiffness of right hip, not elsewhere classified - Plan: PT plan of care cert/re-cert     Problem List Patient Active Problem List   Diagnosis Date Noted  . Hip fracture ([place]) 01/25/2017  . Closed left hip fracture ([place]) 01/25/2017  . Closed hip fracture, right, initial encounter ([place])   . Preop examination   . Fibromyalgia   . Neuropathy   . Corneal abrasion   . Nausea   . Fall   . SIRS (systemic inflammatory response syndrome) ([place]) 07/19/2015  . Seronegative myasthenia gravis ([place]) 07/19/2015  . Acute exacerbation of chronic low back pain 07/19/2015  . Leukocytosis 07/19/2015  . Myasthenia gravis in remission ([place]) 10/23/2014  . OSA on CPAP 10/23/2014  . Hypersomnia, persistent 10/23/2014  . Neurogenic hypoventilation 08/14/2014  . Hypoxemia 08/14/2014  . Myasthenia gravis with exacerbation ([place]) 08/14/2014  . Myasthenia gravis with exacerbation, ocular ([place]) 12/08/2012  . Right shoulder pain 08/02/2012  . 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  . History of optic neuritis 06/27/2012  . Rosacea 06/27/2012  . S/P laminectomy 06/27/2012  . 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 S/P bilateral mastectomy 06/26/2012  . BRCA negative 06/26/2012  . GERD (gastroesophageal reflux disease) 06/26/2012  . Menopause 06/26/2012  . Ankle pain 06/02/2012     Sigurd Sos, PT 04/01/17 1:14 PM  [facility] [address] [place], [place], [address] Phone: [phone]   Fax:  [phone]  Name: Felicia Cain MRN: [id number] Date of Birth: Apr 14, 1956

## 2017-04-01 NOTE — Patient Instructions (Addendum)
KNEE: Extension, Long Arc Quad (Weight)  Place weight around leg. Raise leg until knee is straight. Hold _5__ seconds. Use ___ lb weight. _10__ reps per set (each leg), 4-5__ sets per day, __7_ days per week    Knee Raise   Lift knee and then lower it. Repeat with other knee. Repeat _10__ times each leg. Do _4-5___ sessions per day.   Hip Flexor Stretch    Lying on back near edge of bed, bend one leg, foot flat. Hang other leg over edge, relaxed, thigh resting entirely on bed for 20 seconds. Repeat _3___ times. Do __3__ sessions per day. Advanced Exercise: Bend knee back keeping thigh in contact with bed.      [facility] [address], [place], [place] [address] Phone # [phone] Fax [phone]

## 2017-04-01 NOTE — Addendum Note (Signed)
Addended by: Brand Males E on: 04/01/2017 03:15 PM   Modules accepted: Orders

## 2017-04-05 ENCOUNTER — Ambulatory Visit: Payer: 59

## 2017-04-05 ENCOUNTER — Other Ambulatory Visit (INDEPENDENT_AMBULATORY_CARE_PROVIDER_SITE_OTHER): Payer: Self-pay

## 2017-04-05 DIAGNOSIS — M25551 Pain in right hip: Secondary | ICD-10-CM

## 2017-04-06 ENCOUNTER — Ambulatory Visit (HOSPITAL_COMMUNITY): Payer: 59

## 2017-04-06 MED FILL — DESVENLAFAXINE SUC ER 50 MG: 50 | 30 days supply | Qty: 30 | Fill #1

## 2017-04-06 MED FILL — DOXYCYCLINE HYC 50 MG CAP: 50 | 30 days supply | Qty: 30 | Fill #5

## 2017-04-08 ENCOUNTER — Inpatient Hospital Stay
Admission: RE | Admit: 2017-04-08 | Discharge: 2017-04-08 | Disposition: A | Discharge status: Home / Self Care | Payer: 59 | Source: Ambulatory Visit | Attending: Neurosurgery | Admitting: Neurosurgery

## 2017-04-09 ENCOUNTER — Ambulatory Visit
Admission: RE | Admit: 2017-04-09 | Discharge: 2017-04-09 | Disposition: A | Discharge status: Home / Self Care | Payer: 59 | Source: Ambulatory Visit | Attending: Neurosurgery | Admitting: Neurosurgery

## 2017-04-09 DIAGNOSIS — M5126 Other intervertebral disc displacement, lumbar region: Secondary | ICD-10-CM | POA: Diagnosis not present

## 2017-04-09 DIAGNOSIS — M5416 Radiculopathy, lumbar region: Secondary | ICD-10-CM

## 2017-04-09 MED ORDER — GADOBENATE DIMEGLUMINE 529 MG/ML IV SOLN
13.0000 mL | Freq: Once | INTRAVENOUS | Status: AC | PRN
  Administered 2017-04-09: 13 mL via INTRAVENOUS

## 2017-04-15 ENCOUNTER — Encounter: Payer: Self-pay | Admitting: Physical Therapy

## 2017-04-15 ENCOUNTER — Ambulatory Visit: Payer: 59 | Attending: Orthopaedic Surgery | Admitting: Physical Therapy

## 2017-04-15 DIAGNOSIS — M25651 Stiffness of right hip, not elsewhere classified: Secondary | ICD-10-CM | POA: Insufficient documentation

## 2017-04-15 DIAGNOSIS — M6281 Muscle weakness (generalized): Secondary | ICD-10-CM | POA: Diagnosis not present

## 2017-04-15 DIAGNOSIS — R2689 Other abnormalities of gait and mobility: Secondary | ICD-10-CM | POA: Diagnosis not present

## 2017-04-15 NOTE — Therapy (Signed)
[facility] [address] [place], [place], [address] Phone: [phone]   Fax:  [phone]  Physical Therapy Treatment  Patient Details  Name: Felicia Cain MRN: [id number] Date of Birth: May 27, 1956 Referring Provider: Jean Rosenthal, MD  Encounter Date: 04/15/2017      PT End of Session - 04/15/17 1228    Visit Number 2   Date for PT Re-Evaluation 05/27/17   PT Start Time 0349   PT Stop Time 1240   PT Time Calculation (min) 46 min   Activity Tolerance Patient tolerated treatment well   Behavior During Therapy [facility] for tasks assessed/performed      Past Medical History:  Diagnosis Date  . Anxiety   . Arthritis    "back" (01/25/2017)  . Brachial neuritis    neuropathy  . Chronic lower back pain   . DCIS (ductal carcinoma in situ)    "left side?"  . Esophageal stricture    stricture with dysphasia  . Fibromyalgia   . GERD (gastroesophageal reflux disease)   . H/O Doppler ultrasound 2006   see scanned study  . H/O echocardiogram 2004, 2013  . Hepatitis A 1961   [place]"  . History of cardiac monitoring 2006  . History of nuclear stress test 2004   see scanned study  . Migraine    "in the past; cycle related" (01/25/2017)  . Myasthenia gravis in crisis [facility]) 04/2014   respiratory crisis  . NAION (non-arteritic anterior ischemic optic neuropathy), right   . Near syncope   . Neuromuscular disorder ([place])   . Neuropathy   . Optic neuritis    ischaemic optic neuritis, non arteric  . Optic neuropathy   . PONV (postoperative nausea and vomiting)   . Posterior optic neuritis   . Ptosis of eyelid   . Sleep apnea    "I wear Moses device; I don't wear CPAP" (01/25/2017)  . Spinal stenosis     Past Surgical History:  Procedure Laterality Date  . AUGMENTATION MAMMAPLASTY     2000, after breast cancer surgery,redone 2010  . BACK SURGERY    . BREAST BIOPSY    . CARPAL TUNNEL RELEASE  Bilateral   . CESAREAN SECTION  1989  . ESOPHAGOGASTRODUODENOSCOPY (EGD) WITH ESOPHAGEAL DILATION    . HIP PINNING,CANNULATED Right 01/26/2017   Procedure: CANNULATED SCREWS/RIGHT HIP PINNING;  Surgeon: Mcarthur Rossetti, MD;  Location: [place];  Service: Orthopedics;  Laterality: Right;  . INNER EAR SURGERY Right 1995  . LUMBAR LAMINECTOMY Right 1999   [name]  . MASTECTOMY Bilateral     There were no vitals filed for this visit.      Subjective Assessment - 04/15/17 1200    Subjective I have pain everyday. I had a MRI and waiting for the results.    Pertinent History Rt hip fracture with pin placement: 01/26/17   Limitations Walking   How long can you stand comfortably? 10 minutes   How long can you walk comfortably? <5 minutes    Patient Stated Goals improve Rt hip strength, walk without cane, walk longer  so I can return to work   Currently in Pain? Yes   Pain Score 5    Pain Location Hip   Pain Orientation Right   Pain Descriptors / Indicators Sore   Pain Type Surgical pain   Pain Frequency Constant   Aggravating Factors  sitting too long, walking   Pain Relieving Factors stretching,rest  [facility] Adult PT Treatment/Exercise - 04/15/17 0001      Exercises   Exercises --  seated ball squeeze hold 10 sec 8 x     Lumbar Exercises: Aerobic   Stationary Bike nustep 5 min; arm #11; seat #9  increased numbness in right leg     Lumbar Exercises: Seated   Long Arc Quad on Chair Strengthening;Right;2 sets;15 reps;Weights   LAQ on Chair Weights (lbs) 2     Modalities   Modalities Moist Heat     Moist Heat Therapy   Number Minutes Moist Heat 15 Minutes   Moist Heat Location Other (comment)     Manual Therapy   Manual Therapy Soft tissue mobilization   Manual therapy comments --  right groin in reclined position   Soft tissue mobilization right psoas, right quad, right pectinius, right groin                  PT  Short Term Goals - 04/15/17 1232      PT SHORT TERM GOAL #1   Title be independent in initial HEP   Time 4   Period Weeks   Status New     PT SHORT TERM GOAL #2   Title demonstrate symmetry with ambulation on level surface with use of cane   Time 4   Period Weeks   Status New     PT SHORT TERM GOAL #3   Title report < or = to 3/10 Rt hip pain with standing and walking   Time 4   Period Weeks   Status New     PT SHORT TERM GOAL #4   Title improve strength and reduce pain to walk for 10 minutes without rest   Time 4   Period Weeks   Status New           PT Long Term Goals - 04/01/17 1135      PT LONG TERM GOAL #1   Title be independent in advanced HEP   Time 8   Period Weeks   Status New     PT LONG TERM GOAL #2   Title reduce FOTO to < or = to 44% limitation   Time 8   Period Weeks   Status New     PT LONG TERM GOAL #3   Title stand and walk for 30 minutes without rest    Time 8   Period Weeks   Status New     PT LONG TERM GOAL #4   Title demonstrate 4+/5 Rt hip flexor strength to improve mobility and transitions   Time 8   Period Weeks   Status New     PT LONG TERM GOAL #5   Title report a 75% reduction in Rt LE pain with standing and walking   Time 8   Period Weeks   Status New     Additional Long Term Goals   Additional Long Term Goals Yes     PT LONG TERM GOAL #6   Title improve strength to negotiate steps with step-over-step gait   Time 8   Period Weeks   Status New               Plan - 04/15/17 1229    Clinical Impression Statement Patient has difficulty with exercise due to right hip pain, numbness in right leg.  Patient had a MRI of her back and waiting for the results.  Patient benefited from soft tissue work more for pain.  Patinet has trigger points in right psoas, right hip adductor, right sartorius.  Patient needs to be relcined when laying down  due to back pain. Patient will benefit from skilled therapy to improve  flexibility, gait, core strength, balance and endurance.    Rehab Potential Good   PT Frequency 2x / week   PT Duration 8 weeks   PT Treatment/Interventions ADLs/Self Care Home Management;Cryotherapy;Electrical Stimulation;Moist Heat;Ultrasound;Gait training;Stair training;Functional mobility training;Therapeutic activities;Therapeutic exercise;Neuromuscular re-education;Passive range of motion;Manual techniques;Dry needling;Taping;Balance training   PT Next Visit Plan  manual to Rt hip flexor and quads, electirical stim with heat; Patient does not want dry needling   PT Home Exercise Plan progress as needed   Consulted and Agree with Plan of Care Patient      Patient will benefit from skilled therapeutic intervention in order to improve the following deficits and impairments:  Abnormal gait, Decreased range of motion, Difficulty walking, Pain, Decreased mobility, Decreased strength, Decreased scar mobility, Impaired flexibility, Improper body mechanics, Decreased activity tolerance, Increased muscle spasms, Decreased endurance  Visit Diagnosis: Muscle weakness (generalized)  Other abnormalities of gait and mobility  Stiffness of right hip, not elsewhere classified     Problem List Patient Active Problem List   Diagnosis Date Noted  . Hip fracture ([place]) 01/25/2017  . Closed left hip fracture ([place]) 01/25/2017  . Closed hip fracture, right, initial encounter ([place])   . Preop examination   . Fibromyalgia   . Neuropathy   . Corneal abrasion   . Nausea   . Fall   . SIRS (systemic inflammatory response syndrome) ([place]) 07/19/2015  . Seronegative myasthenia gravis ([place]) 07/19/2015  . Acute exacerbation of chronic low back pain 07/19/2015  . Leukocytosis 07/19/2015  . Myasthenia gravis in remission ([place]) 10/23/2014  . OSA on CPAP 10/23/2014  . Hypersomnia, persistent 10/23/2014  . Neurogenic hypoventilation 08/14/2014  . Hypoxemia 08/14/2014  . Myasthenia gravis with exacerbation  ([place]) 08/14/2014  . Myasthenia gravis with exacerbation, ocular ([place]) 12/08/2012  . Right shoulder pain 08/02/2012  . 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  . History of optic neuritis 06/27/2012  . Rosacea 06/27/2012  . S/P laminectomy 06/27/2012  . 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 S/P bilateral mastectomy 06/26/2012  . BRCA negative 06/26/2012  . GERD (gastroesophageal reflux disease) 06/26/2012  . Menopause 06/26/2012  . Ankle pain 06/02/2012    Earlie Counts, PT 04/15/17 12:33 PM   [facility] [address] [place], [place], [address] Phone: [phone]   Fax:  [phone]  Name: Felicia Cain MRN: [id number] Date of Birth: 09/12/1955

## 2017-04-19 ENCOUNTER — Ambulatory Visit: Payer: 59 | Admitting: Physical Therapy

## 2017-04-19 ENCOUNTER — Encounter: Payer: Self-pay | Admitting: Physical Therapy

## 2017-04-19 DIAGNOSIS — M25651 Stiffness of right hip, not elsewhere classified: Secondary | ICD-10-CM | POA: Diagnosis not present

## 2017-04-19 DIAGNOSIS — R2689 Other abnormalities of gait and mobility: Secondary | ICD-10-CM

## 2017-04-19 DIAGNOSIS — M6281 Muscle weakness (generalized): Secondary | ICD-10-CM

## 2017-04-19 NOTE — Therapy (Signed)
[facility] [address] [place], [place], [address] Phone: [phone]   Fax:  [phone]  Physical Therapy Treatment  Patient Details  Name: Felicia Cain MRN: [id number] Date of Birth: 05/26/1956 Referring Provider: Jean Rosenthal, MD  Encounter Date: 04/19/2017      PT End of Session - 04/19/17 1308    Visit Number 3   Date for PT Re-Evaluation 05/27/17   Authorization Type UMR   PT Start Time 1230   PT Stop Time 1318   PT Time Calculation (min) 48 min   Activity Tolerance Patient tolerated treatment well   Behavior During Therapy [facility] for tasks assessed/performed      Past Medical History:  Diagnosis Date  . Anxiety   . Arthritis    "back" (01/25/2017)  . Brachial neuritis    neuropathy  . Chronic lower back pain   . DCIS (ductal carcinoma in situ)    "left side?"  . Esophageal stricture    stricture with dysphasia  . Fibromyalgia   . GERD (gastroesophageal reflux disease)   . H/O Doppler ultrasound 2006   see scanned study  . H/O echocardiogram 2004, 2013  . Hepatitis A 1961   [place]"  . History of cardiac monitoring 2006  . History of nuclear stress test 2004   see scanned study  . Migraine    "in the past; cycle related" (01/25/2017)  . Myasthenia gravis in crisis [facility]) 04/2014   respiratory crisis  . NAION (non-arteritic anterior ischemic optic neuropathy), right   . Near syncope   . Neuromuscular disorder ([place])   . Neuropathy   . Optic neuritis    ischaemic optic neuritis, non arteric  . Optic neuropathy   . PONV (postoperative nausea and vomiting)   . Posterior optic neuritis   . Ptosis of eyelid   . Sleep apnea    "I wear Moses device; I don't wear CPAP" (01/25/2017)  . Spinal stenosis     Past Surgical History:  Procedure Laterality Date  . AUGMENTATION MAMMAPLASTY     2000, after breast cancer surgery,redone 2010  . BACK SURGERY    . BREAST BIOPSY     . CARPAL TUNNEL RELEASE Bilateral   . CESAREAN SECTION  1989  . ESOPHAGOGASTRODUODENOSCOPY (EGD) WITH ESOPHAGEAL DILATION    . HIP PINNING,CANNULATED Right 01/26/2017   Procedure: CANNULATED SCREWS/RIGHT HIP PINNING;  Surgeon: Mcarthur Rossetti, MD;  Location: [facility];  Service: Orthopedics;  Laterality: Right;  . INNER EAR SURGERY Right 1995  . LUMBAR LAMINECTOMY Right 1999   [name]  . MASTECTOMY Bilateral     There were no vitals filed for this visit.      Subjective Assessment - 04/19/17 1235    Subjective I felt better after last visit.  TIngling and heaviness goes down left leg.    Pertinent History Rt hip fracture with pin placement: 01/26/17   Limitations Walking   How long can you stand comfortably? 10 minutes   How long can you walk comfortably? <5 minutes    Patient Stated Goals improve Rt hip strength, walk without cane, walk longer  so I can return to work   Currently in Pain? Yes   Pain Score 5    Pain Location Hip   Pain Orientation Right   Pain Descriptors / Indicators Sore   Pain Type Surgical pain   Pain Onset More than a month ago   Pain Frequency Constant  Aggravating Factors  sitting too lonng, walking   Pain Relieving Factors stretching, rest   Multiple Pain Sites No                         [facility] Adult PT Treatment/Exercise - 04/19/17 0001      Modalities   Modalities Electrical Stimulation;Moist Heat     Moist Heat Therapy   Number Minutes Moist Heat 15 Minutes   Moist Heat Location Hip  anterior to right knee     Electrical Stimulation   Electrical Stimulation Location anterior right hip and knee   Electrical Stimulation Action IFC   Electrical Stimulation Parameters 15 min., to patient tolerance   Electrical Stimulation Goals Pain     Manual Therapy   Manual Therapy Soft tissue mobilization   Soft tissue mobilization right pectineus, right quads, right distal ilitibial banc, right medial quad, right hip adductor                   PT Short Term Goals - 04/19/17 1314      PT SHORT TERM GOAL #1   Title be independent in initial HEP   Time 4   Period Weeks   Status On-going     PT SHORT TERM GOAL #2   Title demonstrate symmetry with ambulation on level surface with use of cane   Time 4   Period Weeks   Status On-going     PT SHORT TERM GOAL #3   Title report < or = to 3/10 Rt hip pain with standing and walking   Time 4   Period Weeks   Status On-going     PT SHORT TERM GOAL #4   Title improve strength and reduce pain to walk for 10 minutes without rest   Time 4   Period Weeks   Status On-going           PT Long Term Goals - 04/01/17 1135      PT LONG TERM GOAL #1   Title be independent in advanced HEP   Time 8   Period Weeks   Status New     PT LONG TERM GOAL #2   Title reduce FOTO to < or = to 44% limitation   Time 8   Period Weeks   Status New     PT LONG TERM GOAL #3   Title stand and walk for 30 minutes without rest    Time 8   Period Weeks   Status New     PT LONG TERM GOAL #4   Title demonstrate 4+/5 Rt hip flexor strength to improve mobility and transitions   Time 8   Period Weeks   Status New     PT LONG TERM GOAL #5   Title report a 75% reduction in Rt LE pain with standing and walking   Time 8   Period Weeks   Status New     Additional Long Term Goals   Additional Long Term Goals Yes     PT LONG TERM GOAL #6   Title improve strength to negotiate steps with step-over-step gait   Time 8   Period Weeks   Status New               Plan - 04/19/17 1308    Clinical Impression Statement Patient had tightness in the right hip adductor and upper quad. Patient has difficulty with walking due to right hip pain. Patient is  exercise at home but today worked on muscle spasms.  Patient felt better after therapy and muscles were softer.    Rehab Potential Good   PT Frequency 2x / week   PT Duration 8 weeks   PT Treatment/Interventions  ADLs/Self Care Home Management;Cryotherapy;Electrical Stimulation;Moist Heat;Ultrasound;Gait training;Stair training;Functional mobility training;Therapeutic activities;Therapeutic exercise;Neuromuscular re-education;Passive range of motion;Manual techniques;Dry needling;Taping;Balance training   PT Next Visit Plan  manual to Rt hip flexor and quads, electirical stim with heat; Patient does not want dry needling;    PT Home Exercise Plan progress as needed   Recommended Other Services initial summary signed on 04/01/2017 by MD   Consulted and Agree with Plan of Care Patient      Patient will benefit from skilled therapeutic intervention in order to improve the following deficits and impairments:  Abnormal gait, Decreased range of motion, Difficulty walking, Pain, Decreased mobility, Decreased strength, Decreased scar mobility, Impaired flexibility, Improper body mechanics, Decreased activity tolerance, Increased muscle spasms, Decreased endurance  Visit Diagnosis: Muscle weakness (generalized)  Other abnormalities of gait and mobility  Stiffness of right hip, not elsewhere classified     Problem List Patient Active Problem List   Diagnosis Date Noted  . Hip fracture ([place]) 01/25/2017  . Closed left hip fracture ([place]) 01/25/2017  . Closed hip fracture, right, initial encounter ([place])   . Preop examination   . Fibromyalgia   . Neuropathy   . Corneal abrasion   . Nausea   . Fall   . SIRS (systemic inflammatory response syndrome) ([place]) 07/19/2015  . Seronegative myasthenia gravis ([place]) 07/19/2015  . Acute exacerbation of chronic low back pain 07/19/2015  . Leukocytosis 07/19/2015  . Myasthenia gravis in remission ([place]) 10/23/2014  . OSA on CPAP 10/23/2014  . Hypersomnia, persistent 10/23/2014  . Neurogenic hypoventilation 08/14/2014  . Hypoxemia 08/14/2014  . Myasthenia gravis with exacerbation ([place]) 08/14/2014  . Myasthenia gravis with exacerbation, ocular ([place]) 12/08/2012  .  Right shoulder pain 08/02/2012  . 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  . History of optic neuritis 06/27/2012  . Rosacea 06/27/2012  . S/P laminectomy 06/27/2012  . 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 S/P bilateral mastectomy 06/26/2012  . BRCA negative 06/26/2012  . GERD (gastroesophageal reflux disease) 06/26/2012  . Menopause 06/26/2012  . Ankle pain 06/02/2012    Earlie Counts, PT 04/19/17 1:16 PM   [facility] [address] [place], [place], [address] Phone: [phone]   Fax:  [phone]  Name: Felicia Cain MRN: [id number] Date of Birth: 06/16/1956

## 2017-04-22 ENCOUNTER — Encounter: Payer: Self-pay | Admitting: Physical Therapy

## 2017-04-22 ENCOUNTER — Ambulatory Visit: Payer: 59 | Admitting: Physical Therapy

## 2017-04-22 DIAGNOSIS — H2513 Age-related nuclear cataract, bilateral: Secondary | ICD-10-CM | POA: Diagnosis not present

## 2017-04-22 DIAGNOSIS — M6281 Muscle weakness (generalized): Secondary | ICD-10-CM

## 2017-04-22 DIAGNOSIS — R2689 Other abnormalities of gait and mobility: Secondary | ICD-10-CM | POA: Diagnosis not present

## 2017-04-22 DIAGNOSIS — H11823 Conjunctivochalasis, bilateral: Secondary | ICD-10-CM | POA: Diagnosis not present

## 2017-04-22 DIAGNOSIS — H40012 Open angle with borderline findings, low risk, left eye: Secondary | ICD-10-CM | POA: Diagnosis not present

## 2017-04-22 DIAGNOSIS — G7000 Myasthenia gravis without (acute) exacerbation: Secondary | ICD-10-CM | POA: Diagnosis not present

## 2017-04-22 DIAGNOSIS — H16223 Keratoconjunctivitis sicca, not specified as Sjogren's, bilateral: Secondary | ICD-10-CM | POA: Diagnosis not present

## 2017-04-22 DIAGNOSIS — G4733 Obstructive sleep apnea (adult) (pediatric): Secondary | ICD-10-CM | POA: Diagnosis not present

## 2017-04-22 DIAGNOSIS — M25651 Stiffness of right hip, not elsewhere classified: Secondary | ICD-10-CM

## 2017-04-22 DIAGNOSIS — H468 Other optic neuritis: Secondary | ICD-10-CM | POA: Diagnosis not present

## 2017-04-22 NOTE — Patient Instructions (Signed)

## 2017-04-22 NOTE — Therapy (Signed)
[facility] [address] [place], [place], [address] Phone: [phone]   Fax:  [phone]  Physical Therapy Treatment  Patient Details  Name: Felicia Cain MRN: [id number] Date of Birth: 05/03/1956 Referring Provider: Jean Rosenthal, MD  Encounter Date: 04/22/2017      PT End of Session - 04/22/17 1100    Visit Number 4   Date for PT Re-Evaluation 05/27/17   Authorization Type UMR   PT Start Time 1015   PT Stop Time 1100   PT Time Calculation (min) 45 min   Activity Tolerance Patient tolerated treatment well   Behavior During Therapy [facility] for tasks assessed/performed      Past Medical History:  Diagnosis Date  . Anxiety   . Arthritis    "back" (01/25/2017)  . Brachial neuritis    neuropathy  . Chronic lower back pain   . DCIS (ductal carcinoma in situ)    "left side?"  . Esophageal stricture    stricture with dysphasia  . Fibromyalgia   . GERD (gastroesophageal reflux disease)   . H/O Doppler ultrasound 2006   see scanned study  . H/O echocardiogram 2004, 2013  . Hepatitis A 1961   [place]"  . History of cardiac monitoring 2006  . History of nuclear stress test 2004   see scanned study  . Migraine    "in the past; cycle related" (01/25/2017)  . Myasthenia gravis in crisis [facility]) 04/2014   respiratory crisis  . NAION (non-arteritic anterior ischemic optic neuropathy), right   . Near syncope   . Neuromuscular disorder ([place])   . Neuropathy   . Optic neuritis    ischaemic optic neuritis, non arteric  . Optic neuropathy   . PONV (postoperative nausea and vomiting)   . Posterior optic neuritis   . Ptosis of eyelid   . Sleep apnea    "I wear Moses device; I don't wear CPAP" (01/25/2017)  . Spinal stenosis     Past Surgical History:  Procedure Laterality Date  . AUGMENTATION MAMMAPLASTY     2000, after breast cancer surgery,redone 2010  . BACK SURGERY    . BREAST BIOPSY     . CARPAL TUNNEL RELEASE Bilateral   . CESAREAN SECTION  1989  . ESOPHAGOGASTRODUODENOSCOPY (EGD) WITH ESOPHAGEAL DILATION    . HIP PINNING,CANNULATED Right 01/26/2017   Procedure: CANNULATED SCREWS/RIGHT HIP PINNING;  Surgeon: Mcarthur Rossetti, MD;  Location: [facility];  Service: Orthopedics;  Laterality: Right;  . INNER EAR SURGERY Right 1995  . LUMBAR LAMINECTOMY Right 1999   [name]  . MASTECTOMY Bilateral     There were no vitals filed for this visit.      Subjective Assessment - 04/22/17 1020    Subjective I still have pain and have been doing alot of sitting studying for the boards.     Pertinent History Rt hip fracture with pin placement: 01/26/17   Limitations Walking   How long can you stand comfortably? 10 minutes   How long can you walk comfortably? <5 minutes    Patient Stated Goals improve Rt hip strength, walk without cane, walk longer  so I can return to work   Currently in Pain? Yes   Pain Score 7    Pain Location Hip   Pain Orientation Right   Pain Descriptors / Indicators Sore   Pain Type Surgical pain   Pain Onset More than a month ago   Pain Frequency Constant  Aggravating Factors  sitting too long, walking   Pain Relieving Factors stretching, rest   Multiple Pain Sites No                         [facility] Adult PT Treatment/Exercise - 04/22/17 0001      Manual Therapy   Manual Therapy Soft tissue mobilization   Soft tissue mobilization right hip adductor, right quads, right hip rotators, right quadratus, right iliopsoas, right piriformis, right post. great trochanter          Trigger Point Dry Needling - 04/22/17 1022    Consent Given? Yes   Education Handout Provided Yes   Muscles Treated Upper Body Quadratus Lumborum;Gluteus minimus;Gluteus maximus;Piriformis;Tensor fascia lata;Iliopsoas;Adductor longus/brevius/magnus   Muscles Treated Lower Body Adductor longus/brevius/maximus;Quadriceps;Gluteus maximus;Gluteus  minimus;Piriformis   Gluteus Maximus Response Twitch response elicited;Palpable increased muscle length   Gluteus Minimus Response Twitch response elicited;Palpable increased muscle length   Piriformis Response Twitch response elicited;Palpable increased muscle length   Quadriceps Response Twitch response elicited;Palpable increased muscle length   Adductor Response Twitch response elicited;Palpable increased muscle length              PT Education - 04/22/17 1100    Education provided Yes   Education Details information on dry needling   Person(s) Educated Patient   Methods Explanation   Comprehension Verbalized understanding          PT Short Term Goals - 04/19/17 1314      PT SHORT TERM GOAL #1   Title be independent in initial HEP   Time 4   Period Weeks   Status On-going     PT SHORT TERM GOAL #2   Title demonstrate symmetry with ambulation on level surface with use of cane   Time 4   Period Weeks   Status On-going     PT SHORT TERM GOAL #3   Title report < or = to 3/10 Rt hip pain with standing and walking   Time 4   Period Weeks   Status On-going     PT SHORT TERM GOAL #4   Title improve strength and reduce pain to walk for 10 minutes without rest   Time 4   Period Weeks   Status On-going           PT Long Term Goals - 04/01/17 1135      PT LONG TERM GOAL #1   Title be independent in advanced HEP   Time 8   Period Weeks   Status New     PT LONG TERM GOAL #2   Title reduce FOTO to < or = to 44% limitation   Time 8   Period Weeks   Status New     PT LONG TERM GOAL #3   Title stand and walk for 30 minutes without rest    Time 8   Period Weeks   Status New     PT LONG TERM GOAL #4   Title demonstrate 4+/5 Rt hip flexor strength to improve mobility and transitions   Time 8   Period Weeks   Status New     PT LONG TERM GOAL #5   Title report a 75% reduction in Rt LE pain with standing and walking   Time 8   Period Weeks   Status  New     Additional Long Term Goals   Additional Long Term Goals Yes     PT LONG TERM GOAL #  6   Title improve strength to negotiate steps with step-over-step gait   Time 8   Period Weeks   Status New               Plan - 04/22/17 1100    Clinical Impression Statement Patient had many trigger points in the right hip.  Patient was able to extend her right hip further with walking.  Patient has been sitting alot increasing her right hip pain.  Patient does her exercise at home.  Patient will benefit from skilled therapy to reduce pain and improve function.    Rehab Potential Good   PT Frequency 2x / week   PT Duration 8 weeks   PT Treatment/Interventions ADLs/Self Care Home Management;Cryotherapy;Electrical Stimulation;Moist Heat;Ultrasound;Gait training;Stair training;Functional mobility training;Therapeutic activities;Therapeutic exercise;Neuromuscular re-education;Passive range of motion;Manual techniques;Dry needling;Taping;Balance training   PT Next Visit Plan  manual to Rt hip flexor and quads, electirical stim with heat;    PT Home Exercise Plan progress as needed   Consulted and Agree with Plan of Care Patient      Patient will benefit from skilled therapeutic intervention in order to improve the following deficits and impairments:  Abnormal gait, Decreased range of motion, Difficulty walking, Pain, Decreased mobility, Decreased strength, Decreased scar mobility, Impaired flexibility, Improper body mechanics, Decreased activity tolerance, Increased muscle spasms, Decreased endurance  Visit Diagnosis: Muscle weakness (generalized)  Other abnormalities of gait and mobility  Stiffness of right hip, not elsewhere classified     Problem List Patient Active Problem List   Diagnosis Date Noted  . Hip fracture ([place]) 01/25/2017  . Closed left hip fracture ([place]) 01/25/2017  . Closed hip fracture, right, initial encounter ([place])   . Preop examination   . Fibromyalgia   .  Neuropathy   . Corneal abrasion   . Nausea   . Fall   . SIRS (systemic inflammatory response syndrome) ([place]) 07/19/2015  . Seronegative myasthenia gravis ([place]) 07/19/2015  . Acute exacerbation of chronic low back pain 07/19/2015  . Leukocytosis 07/19/2015  . Myasthenia gravis in remission ([place]) 10/23/2014  . OSA on CPAP 10/23/2014  . Hypersomnia, persistent 10/23/2014  . Neurogenic hypoventilation 08/14/2014  . Hypoxemia 08/14/2014  . Myasthenia gravis with exacerbation ([place]) 08/14/2014  . Myasthenia gravis with exacerbation, ocular ([place]) 12/08/2012  . Right shoulder pain 08/02/2012  . 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  . History of optic neuritis 06/27/2012  . Rosacea 06/27/2012  . S/P laminectomy 06/27/2012  . 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 S/P bilateral mastectomy 06/26/2012  . BRCA negative 06/26/2012  . GERD (gastroesophageal reflux disease) 06/26/2012  . Menopause 06/26/2012  . Ankle pain 06/02/2012    Earlie Counts, PT 04/22/17 11:03 AM   [facility] [address], [place], [place], [address] Phone: [phone]   Fax:  [phone]  Name: Felicia Cain MRN: [id number] Date of Birth: 17-Jun-1956

## 2017-04-26 ENCOUNTER — Ambulatory Visit: Payer: 59 | Admitting: Physical Therapy

## 2017-04-26 ENCOUNTER — Encounter: Payer: Self-pay | Admitting: Physical Therapy

## 2017-04-26 DIAGNOSIS — M6281 Muscle weakness (generalized): Secondary | ICD-10-CM | POA: Diagnosis not present

## 2017-04-26 DIAGNOSIS — M25651 Stiffness of right hip, not elsewhere classified: Secondary | ICD-10-CM

## 2017-04-26 DIAGNOSIS — R2689 Other abnormalities of gait and mobility: Secondary | ICD-10-CM | POA: Diagnosis not present

## 2017-04-26 NOTE — Therapy (Signed)
[facility] [address] [place], [place], [address] Phone: [phone]   Fax:  [phone]  Physical Therapy Treatment  Patient Details  Name: Felicia Cain MRN: [id number] Date of Birth: 1956-04-03 Referring Provider: Jean Rosenthal, MD  Encounter Date: 04/26/2017      PT End of Session - 04/26/17 1316    Visit Number 5   Date for PT Re-Evaluation 05/27/17   Authorization Type UMR   PT Start Time 1230   PT Stop Time 1320   PT Time Calculation (min) 50 min   Activity Tolerance Patient tolerated treatment well   Behavior During Therapy [facility] for tasks assessed/performed      Past Medical History:  Diagnosis Date  . Anxiety   . Arthritis    "back" (01/25/2017)  . Brachial neuritis    neuropathy  . Chronic lower back pain   . DCIS (ductal carcinoma in situ)    "left side?"  . Esophageal stricture    stricture with dysphasia  . Fibromyalgia   . GERD (gastroesophageal reflux disease)   . H/O Doppler ultrasound 2006   see scanned study  . H/O echocardiogram 2004, 2013  . Hepatitis A 1961   [place]"  . History of cardiac monitoring 2006  . History of nuclear stress test 2004   see scanned study  . Migraine    "in the past; cycle related" (01/25/2017)  . Myasthenia gravis in crisis [facility]) 04/2014   respiratory crisis  . NAION (non-arteritic anterior ischemic optic neuropathy), right   . Near syncope   . Neuromuscular disorder ([place])   . Neuropathy   . Optic neuritis    ischaemic optic neuritis, non arteric  . Optic neuropathy   . PONV (postoperative nausea and vomiting)   . Posterior optic neuritis   . Ptosis of eyelid   . Sleep apnea    "I wear Moses device; I don't wear CPAP" (01/25/2017)  . Spinal stenosis     Past Surgical History:  Procedure Laterality Date  . AUGMENTATION MAMMAPLASTY     2000, after breast cancer surgery,redone 2010  . BACK SURGERY    . BREAST BIOPSY     . CARPAL TUNNEL RELEASE Bilateral   . CESAREAN SECTION  1989  . ESOPHAGOGASTRODUODENOSCOPY (EGD) WITH ESOPHAGEAL DILATION    . HIP PINNING,CANNULATED Right 01/26/2017   Procedure: CANNULATED SCREWS/RIGHT HIP PINNING;  Surgeon: Mcarthur Rossetti, MD;  Location: [place];  Service: Orthopedics;  Laterality: Right;  . INNER EAR SURGERY Right 1995  . LUMBAR LAMINECTOMY Right 1999   [name]  . MASTECTOMY Bilateral     There were no vitals filed for this visit.      Subjective Assessment - 04/26/17 1235    Subjective initially dry needling helped.    Pertinent History Rt hip fracture with pin placement: 01/26/17   Limitations Walking   How long can you stand comfortably? 10 minutes   How long can you walk comfortably? <5 minutes    Patient Stated Goals improve Rt hip strength, walk without cane, walk longer  so I can return to work   Currently in Pain? Yes   Pain Score 8    Pain Location Hip   Pain Orientation Right   Pain Descriptors / Indicators Sore   Pain Type Surgical pain   Pain Onset More than a month ago   Pain Frequency Constant   Aggravating Factors  sitting too long, walking   Pain  Relieving Factors stretching,rest   Multiple Pain Sites No                         [facility] Adult PT Treatment/Exercise - 04/26/17 0001      Modalities   Modalities Electrical Stimulation;Moist Heat     Moist Heat Therapy   Number Minutes Moist Heat 15 Minutes   Moist Heat Location Hip  right hip anterior and medial     Electrical Stimulation   Electrical Stimulation Location anterior right hip and knee   Electrical Stimulation Action IFC   Electrical Stimulation Parameters 15 min, to patient tolerance   Electrical Stimulation Goals Pain     Manual Therapy   Manual Therapy Soft tissue mobilization   Manual therapy comments manually stretched right hip adductors and hip flexor   Soft tissue mobilization right iliopsoas, right hip adductor, right sartorius           Trigger Point Dry Needling - 04/26/17 1241    Consent Given? Yes   Muscles Treated Upper Body Iliopsoas;Adductor longus/brevius/magnus   Muscles Treated Lower Body Adductor longus/brevius/maximus   Adductor Response Twitch response elicited;Palpable increased muscle length                PT Short Term Goals - 04/19/17 1314      PT SHORT TERM GOAL #1   Title be independent in initial HEP   Time 4   Period Weeks   Status On-going     PT SHORT TERM GOAL #2   Title demonstrate symmetry with ambulation on level surface with use of cane   Time 4   Period Weeks   Status On-going     PT SHORT TERM GOAL #3   Title report < or = to 3/10 Rt hip pain with standing and walking   Time 4   Period Weeks   Status On-going     PT SHORT TERM GOAL #4   Title improve strength and reduce pain to walk for 10 minutes without rest   Time 4   Period Weeks   Status On-going           PT Long Term Goals - 04/01/17 1135      PT LONG TERM GOAL #1   Title be independent in advanced HEP   Time 8   Period Weeks   Status New     PT LONG TERM GOAL #2   Title reduce FOTO to < or = to 44% limitation   Time 8   Period Weeks   Status New     PT LONG TERM GOAL #3   Title stand and walk for 30 minutes without rest    Time 8   Period Weeks   Status New     PT LONG TERM GOAL #4   Title demonstrate 4+/5 Rt hip flexor strength to improve mobility and transitions   Time 8   Period Weeks   Status New     PT LONG TERM GOAL #5   Title report a 75% reduction in Rt LE pain with standing and walking   Time 8   Period Weeks   Status New     Additional Long Term Goals   Additional Long Term Goals Yes     PT LONG TERM GOAL #6   Title improve strength to negotiate steps with step-over-step gait   Time 8   Period Weeks   Status New  Plan - 04/26/17 1317    Clinical Impression Statement Patient has trigger points in right Sartorius, pecitneus, right  ilipsoas, and hip adductors.  Patient has had increased pain in right groin to her knee.  Patient still has to walk with a cane.  Patient has increased pain when flexing her right hip. Patient will benefit from skilled therapy to reduce pain and improve function.    Rehab Potential Good   PT Frequency 2x / week   PT Duration 8 weeks   PT Treatment/Interventions ADLs/Self Care Home Management;Cryotherapy;Electrical Stimulation;Moist Heat;Ultrasound;Gait training;Stair training;Functional mobility training;Therapeutic activities;Therapeutic exercise;Neuromuscular re-education;Passive range of motion;Manual techniques;Dry needling;Taping;Balance training   PT Next Visit Plan  manual to Rt hip flexor and quads, electirical stim with heat; write MD progress note   PT Home Exercise Plan progress as needed   Consulted and Agree with Plan of Care Patient      Patient will benefit from skilled therapeutic intervention in order to improve the following deficits and impairments:  Abnormal gait, Decreased range of motion, Difficulty walking, Pain, Decreased mobility, Decreased strength, Decreased scar mobility, Impaired flexibility, Improper body mechanics, Decreased activity tolerance, Increased muscle spasms, Decreased endurance  Visit Diagnosis: Muscle weakness (generalized)  Stiffness of right hip, not elsewhere classified  Other abnormalities of gait and mobility     Problem List Patient Active Problem List   Diagnosis Date Noted  . Hip fracture ([place]) 01/25/2017  . Closed left hip fracture ([place]) 01/25/2017  . Closed hip fracture, right, initial encounter ([place])   . Preop examination   . Fibromyalgia   . Neuropathy   . Corneal abrasion   . Nausea   . Fall   . SIRS (systemic inflammatory response syndrome) ([place]) 07/19/2015  . Seronegative myasthenia gravis ([place]) 07/19/2015  . Acute exacerbation of chronic low back pain 07/19/2015  . Leukocytosis 07/19/2015  . Myasthenia gravis in  remission ([place]) 10/23/2014  . OSA on CPAP 10/23/2014  . Hypersomnia, persistent 10/23/2014  . Neurogenic hypoventilation 08/14/2014  . Hypoxemia 08/14/2014  . Myasthenia gravis with exacerbation ([place]) 08/14/2014  . Myasthenia gravis with exacerbation, ocular ([place]) 12/08/2012  . Right shoulder pain 08/02/2012  . 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  . History of optic neuritis 06/27/2012  . Rosacea 06/27/2012  . S/P laminectomy 06/27/2012  . 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 S/P bilateral mastectomy 06/26/2012  . BRCA negative 06/26/2012  . GERD (gastroesophageal reflux disease) 06/26/2012  . Menopause 06/26/2012  . Ankle pain 06/02/2012    Earlie Counts, PT 04/26/17 1:20 PM   [facility] [address] [place], [place], [address] Phone: [phone]   Fax:  [phone]  Name: Felicia Cain MRN: [id number] Date of Birth: 1955/11/12

## 2017-04-29 ENCOUNTER — Encounter: Payer: Self-pay | Admitting: Physical Therapy

## 2017-04-29 ENCOUNTER — Ambulatory Visit: Payer: 59 | Admitting: Physical Therapy

## 2017-04-29 DIAGNOSIS — M25651 Stiffness of right hip, not elsewhere classified: Secondary | ICD-10-CM

## 2017-04-29 DIAGNOSIS — R2689 Other abnormalities of gait and mobility: Secondary | ICD-10-CM

## 2017-04-29 DIAGNOSIS — M6281 Muscle weakness (generalized): Secondary | ICD-10-CM | POA: Diagnosis not present

## 2017-04-29 NOTE — Therapy (Signed)
[facility] [address] [place], [place], [address] Phone: [phone]   Fax:  [phone]  Physical Therapy Treatment  Patient Details  Name: Felicia Cain MRN: [id number] Date of Birth: 04/29/1956 Referring Provider: Dr. Jean Rosenthal  Encounter Date: 04/29/2017      PT End of Session - 04/29/17 1214    Visit Number 6   Date for PT Re-Evaluation 05/27/17   Authorization Type UMR   PT Start Time 1157  came late   PT Stop Time 1230   PT Time Calculation (min) 33 min   Activity Tolerance Patient tolerated treatment well   Behavior During Therapy [facility] for tasks assessed/performed      Past Medical History:  Diagnosis Date  . Anxiety   . Arthritis    "back" (01/25/2017)  . Brachial neuritis    neuropathy  . Chronic lower back pain   . DCIS (ductal carcinoma in situ)    "left side?"  . Esophageal stricture    stricture with dysphasia  . Fibromyalgia   . GERD (gastroesophageal reflux disease)   . H/O Doppler ultrasound 2006   see scanned study  . H/O echocardiogram 2004, 2013  . Hepatitis A 1961   [place]epidemic [place] city"  . History of cardiac monitoring 2006  . History of nuclear stress test 2004   see scanned study  . Migraine    "in the past; cycle related" (01/25/2017)  . Myasthenia gravis in crisis [facility]) 04/2014   respiratory crisis  . NAION (non-arteritic anterior ischemic optic neuropathy), right   . Near syncope   . Neuromuscular disorder ([place])   . Neuropathy   . Optic neuritis    ischaemic optic neuritis, non arteric  . Optic neuropathy   . PONV (postoperative nausea and vomiting)   . Posterior optic neuritis   . Ptosis of eyelid   . Sleep apnea    "I wear Moses device; I don't wear CPAP" (01/25/2017)  . Spinal stenosis     Past Surgical History:  Procedure Laterality Date  . AUGMENTATION MAMMAPLASTY     2000, after breast cancer surgery,redone 2010  . BACK SURGERY    .  BREAST BIOPSY    . CARPAL TUNNEL RELEASE Bilateral   . CESAREAN SECTION  1989  . ESOPHAGOGASTRODUODENOSCOPY (EGD) WITH ESOPHAGEAL DILATION    . HIP PINNING,CANNULATED Right 01/26/2017   Procedure: CANNULATED SCREWS/RIGHT HIP PINNING;  Surgeon: Mcarthur Rossetti, MD;  Location: [place];  Service: Orthopedics;  Laterality: Right;  . INNER EAR SURGERY Right 1995  . LUMBAR LAMINECTOMY Right 1999   [name]  . MASTECTOMY Bilateral     There were no vitals filed for this visit.      Subjective Assessment - 04/29/17 1200    Subjective I am having alot of stiffness in right hip and pain refers to the knee. Patient reports her pain is getting worse especially in sitting.    Pertinent History Rt hip fracture with pin placement: 01/26/17   Limitations Walking   How long can you stand comfortably? 10 minutes   How long can you walk comfortably? <5 minutes    Patient Stated Goals improve Rt hip strength, walk without cane, walk longer  so I can return to work   Currently in Pain? Yes   Pain Score 8    Pain Location Hip   Pain Orientation Right   Pain Descriptors / Indicators Sore   Pain Type Surgical pain  Pain Onset More than a month ago   Pain Frequency Constant   Aggravating Factors  sitting too long, walking   Pain Relieving Factors stretching, rest   Multiple Pain Sites No            [facility] PT Assessment - 04/29/17 0001      Assessment   Medical Diagnosis closed fracture of Rt hip with routine healing   Referring Provider Dr. Jean Rosenthal   Onset Date/Surgical Date 01/26/17  surgery.  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     Cognition   Overall Cognitive Status Within Functional Limits for tasks assessed     Observation/Other Assessments   Focus on Therapeutic Outcomes (FOTO)  62% limitation     ROM / Strength   AROM / PROM / Strength AROM;PROM;Strength     PROM   PROM Assessment Site Hip   Right Hip External Rotation  31  sitting   Right Hip Internal Rotation  10  sitting     Strength   Right Hip Flexion 2+/5   Right Hip Extension 2-/5   Right Hip External Rotation  2/5   Right Hip Internal Rotation 2+/5   Right Hip ABduction 2+/5     Right Hip   Right Hip Flexion 90   Right Hip ABduction 10                     [facility] Adult PT Treatment/Exercise - 04/29/17 0001      Modalities   Modalities Moist Heat     Moist Heat Therapy   Number Minutes Moist Heat 10 Minutes   Moist Heat Location Hip  anterior                PT Education - 04/29/17 1238    Education provided Yes   Education Details hip strengthening with AAROM in standing, supine, and sidely   Person(s) Educated Patient   Methods Explanation;Demonstration;Verbal cues;Handout   Comprehension Verbalized understanding;Returned demonstration          PT Short Term Goals - 04/19/17 1314      PT SHORT TERM GOAL #1   Title be independent in initial HEP   Time 4   Period Weeks   Status On-going     PT SHORT TERM GOAL #2   Title demonstrate symmetry with ambulation on level surface with use of cane   Time 4   Period Weeks   Status On-going     PT SHORT TERM GOAL #3   Title report < or = to 3/10 Rt hip pain with standing and walking   Time 4   Period Weeks    Status On-going     PT SHORT TERM GOAL #  4   Title improve strength and reduce pain to walk for 10 minutes without rest   Time 4   Period Weeks   Status On-going           PT Long Term Goals - 04/01/17 1135      PT LONG TERM GOAL #1   Title be independent in advanced HEP   Time 8   Period Weeks   Status New     PT LONG TERM GOAL #2   Title reduce FOTO to < or = to 44% limitation   Time 8   Period Weeks   Status New     PT LONG TERM GOAL #3   Title stand and walk for 30 minutes without rest    Time 8   Period Weeks   Status New     PT LONG TERM GOAL #4   Title demonstrate 4+/5 Rt hip flexor strength to improve mobility and transitions   Time 8   Period Weeks   Status New     PT LONG TERM GOAL #5   Title report a 75% reduction in Rt LE pain with standing and walking   Time 8   Period Weeks   Status New     Additional Long Term Goals   Additional Long Term Goals Yes     PT LONG TERM GOAL #6   Title improve strength to negotiate steps with step-over-step gait   Time 8   Period Weeks   Status New               Plan - 04/29/17 1214    Clinical Impression Statement Patient has decreased left hip PROM and decreased strength.  Patient reports pain level 8/10 today and reports no progress at this time. Her pain will radiate into the right quads to the knee. The pain limits patient ROM and exercise.  Therapist has tried dry needling to right iliopsoas and buttocks and quads with temporary relief.  Patient continues to ambulate with a cane.  Patient is not able to sit at 90/90 due to limited right hip flexion and pain.  Patient is getting frustrated with her slow progress. Patient will benefit from skilled therapy to  pain and improve strength.    Rehab Potential Good   PT Frequency 2x / week   PT Duration 8 weeks   PT Treatment/Interventions ADLs/Self Care Home Management;Cryotherapy;Electrical Stimulation;Moist Heat;Ultrasound;Gait training;Stair  training;Functional mobility training;Therapeutic activities;Therapeutic exercise;Neuromuscular re-education;Passive range of motion;Manual techniques;Dry needling;Taping;Balance training   PT Next Visit Plan see what MD says; soft tissue work and hip exercises   PT Home Exercise Plan progress as needed   Consulted and Agree with Plan of Care Patient      Patient will benefit from skilled therapeutic intervention in order to improve the following deficits and impairments:  Abnormal gait, Decreased range of motion, Difficulty walking, Pain, Decreased mobility, Decreased strength, Decreased scar mobility, Impaired flexibility, Improper body mechanics, Decreased activity tolerance, Increased muscle spasms, Decreased endurance  Visit Diagnosis: Muscle weakness (generalized)  Stiffness of right hip, not elsewhere classified  Other abnormalities of gait and mobility     Problem List Patient Active Problem List   Diagnosis Date Noted  . Hip fracture ([place]) 01/25/2017  . Closed left hip fracture ([place]) 01/25/2017  . Closed hip fracture, right, initial encounter ([place])   . Preop examination   . Fibromyalgia   . Neuropathy   . Corneal abrasion   . Nausea   . Fall   .  SIRS (systemic inflammatory response syndrome) ([place]) 07/19/2015  . Seronegative myasthenia gravis ([place]) 07/19/2015  . Acute exacerbation of chronic low back pain 07/19/2015  . Leukocytosis 07/19/2015  . Myasthenia gravis in remission ([place]) 10/23/2014  . OSA on CPAP 10/23/2014  . Hypersomnia, persistent 10/23/2014  . Neurogenic hypoventilation 08/14/2014  . Hypoxemia 08/14/2014  . Myasthenia gravis with exacerbation ([place]) 08/14/2014  . Myasthenia gravis with exacerbation, ocular ([place]) 12/08/2012  . Right shoulder pain 08/02/2012  . 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  . History of optic neuritis 06/27/2012  . Rosacea 06/27/2012  . S/P laminectomy 06/27/2012  . 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 S/P bilateral  mastectomy 06/26/2012  . BRCA negative 06/26/2012  . GERD (gastroesophageal reflux disease) 06/26/2012  . Menopause 06/26/2012  . Ankle pain 06/02/2012    Earlie Counts, PT 04/29/17 12:47 PM   [facility] [address] [place], [place], [address] Phone: [phone]   Fax:  [phone]  Name: Felicia Cain MRN: [id number] Date of Birth: 1955-12-21

## 2017-04-29 NOTE — Patient Instructions (Addendum)
Bracing With Heel Slides (Supine)    With neutral spine, tighten pelvic floor and abdominals and hold. Alternating legs, slide heel to bottom. Repeat _10__ times. Do _2__ times a day.   Copyright  VHI. All rights reserved.  Bracing With Knee Fallout (Hook-Lying)    With neutral spine, tighten pelvic floor and abdominals and hold. Alternating legs, drop knee out to side. Keep opposite hip still. Repeat _10__ times. Do _2__ times a day.   Copyright  VHI. All rights reserved.  Bracing With Bridging (Hook-Lying)    With neutral spine, tighten pelvic floor and abdominals and hold. Lift bottom. Repeat _10__ times. Do _2__ times a day.   Copyright  VHI. All rights reserved.  Gluteal Sets    Squeeze pelvic floor and hold. Tighten bottom. Hold for _10__ seconds. Relax for _10__ seconds. Repeat _10__ times. Do _2__ times a day.   Copyright  VHI. All rights reserved.    Straight Leg Raise    Squeeze pelvic floor and hold. Tighten top of left thigh. Raise leg off bed 4___ inches. Hold for _1__ seconds. Relax for _1__ seconds. Repeat 10___ times. Do _2__ times a day. Repeat with other leg.  Can use a strap to help.   Copyright  VHI. All rights reserved.    1. Start supine with one leg bent. Use figure 8 wrap technique (in/out) to wrap opposite leg.  2. Move wrapped leg outward, like a windshield wiper (move entire hip) without using the strap, until a slight stretch is felt.  3. Pull with strap for a deeper stretch  Plantarflexion (Eccentric), (Resistance Band)    Point foot down against resistance band. Slowly release for 3-5 seconds. Use ___black_____ resistance band. _30__ reps per set, _1__ sets per day, _2__ days per week.  http://ecce.exer.us/3   Copyright  VHI. All rights reserved.     Keep pillows between knees.  While lying on your side, slowly raise up your top leg to the side. Keep your knee straight and maintain your toes pointed forward the entire  time. Keep your leg in-line with your body.  The bottom leg can be bent to stabilize your body.    While standing, draw up your knee, set it down and then alternate to your other side.  Use your arms for support if needed for balance and safety.   [place] [address] [place], [place] [address] Phone # [phone] Fax [phone]

## 2017-05-03 ENCOUNTER — Ambulatory Visit (INDEPENDENT_AMBULATORY_CARE_PROVIDER_SITE_OTHER): Payer: 59

## 2017-05-03 ENCOUNTER — Encounter (INDEPENDENT_AMBULATORY_CARE_PROVIDER_SITE_OTHER): Payer: Self-pay | Admitting: Orthopaedic Surgery

## 2017-05-03 ENCOUNTER — Ambulatory Visit (INDEPENDENT_AMBULATORY_CARE_PROVIDER_SITE_OTHER): Payer: 59 | Admitting: Orthopaedic Surgery

## 2017-05-03 DIAGNOSIS — S72001A Fracture of unspecified part of neck of right femur, initial encounter for closed fracture: Secondary | ICD-10-CM

## 2017-05-03 NOTE — Progress Notes (Signed)
The patient is now 3 months status post cannulated screw fixation of a nondisplaced right hip femoral neck fracture. She is finally transition to a cane from a hip and go through extensive physical therapy. She still having debilitating pain though. This is affecting her mobility as well as her quality of life. 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 On examination I can put her through internal/external rotation of her right hip but is severely painful to her at the extremes of rotation. This radiates into her knee as her foot and ankle.  X-rays reviewed and shows that the hardware is intact and there is no evidence of osteonecrosis of the femoral head. It does appear that the fracture is healed on plain films.  I agree with her slowly increase her activities and being out of work secondary to her pain until she feels much better he can contract her job but her feet all day. However I do not need to see her back for 3 months. I would like an AP and lateral of her right hip at that visit.

## 2017-05-04 ENCOUNTER — Encounter: Payer: Self-pay | Admitting: Physical Therapy

## 2017-05-04 ENCOUNTER — Ambulatory Visit: Payer: 59 | Admitting: Physical Therapy

## 2017-05-04 DIAGNOSIS — R2689 Other abnormalities of gait and mobility: Secondary | ICD-10-CM | POA: Diagnosis not present

## 2017-05-04 DIAGNOSIS — M25651 Stiffness of right hip, not elsewhere classified: Secondary | ICD-10-CM

## 2017-05-04 DIAGNOSIS — M6281 Muscle weakness (generalized): Secondary | ICD-10-CM

## 2017-05-04 MED FILL — DOXYCYCLINE HYC 50 MG CAP: 50 | 30 days supply | Qty: 30 | Fill #6

## 2017-05-04 MED FILL — GABAPENTIN 300 MG CAPSULE: 300 | 30 days supply | Qty: 180 | Fill #1

## 2017-05-04 MED FILL — DESVENLAFAXINE SUC ER 50 MG: 50 | 30 days supply | Qty: 30 | Fill #2

## 2017-05-04 MED FILL — AVENOVA LID & LASH SPRAY: 0.01 | 30 days supply | Qty: 40 | Fill #1

## 2017-05-04 NOTE — Therapy (Signed)
[facility] [address] [place], [place], [address] Phone: [phone]   Fax:  [phone]  Physical Therapy Treatment  Patient Details  Name: Felicia Cain MRN: [id number] Date of Birth: November 12, 1955 Referring Provider: Dr. Jean Rosenthal  Encounter Date: 05/04/2017      PT End of Session - 05/04/17 1318    Visit Number 7   Date for PT Re-Evaluation 05/27/17   Authorization Type UMR   PT Start Time 1237   PT Stop Time 1315   PT Time Calculation (min) 38 min   Activity Tolerance Patient tolerated treatment well   Behavior During Therapy [facility] for tasks assessed/performed      Past Medical History:  Diagnosis Date  . Anxiety   . Arthritis    "back" (01/25/2017)  . Brachial neuritis    neuropathy  . Chronic lower back pain   . DCIS (ductal carcinoma in situ)    "left side?"  . Esophageal stricture    stricture with dysphasia  . Fibromyalgia   . GERD (gastroesophageal reflux disease)   . H/O Doppler ultrasound 2006   see scanned study  . H/O echocardiogram 2004, 2013  . Hepatitis A 1961   [place]"  . History of cardiac monitoring 2006  . History of nuclear stress test 2004   see scanned study  . Migraine    "in the past; cycle related" (01/25/2017)  . Myasthenia gravis in crisis [facility]) 04/2014   respiratory crisis  . NAION (non-arteritic anterior ischemic optic neuropathy), right   . Near syncope   . Neuromuscular disorder ([place])   . Neuropathy   . Optic neuritis    ischaemic optic neuritis, non arteric  . Optic neuropathy   . PONV (postoperative nausea and vomiting)   . Posterior optic neuritis   . Ptosis of eyelid   . Sleep apnea    "I wear Moses device; I don't wear CPAP" (01/25/2017)  . Spinal stenosis     Past Surgical History:  Procedure Laterality Date  . AUGMENTATION MAMMAPLASTY     2000, after breast cancer surgery,redone 2010  . BACK SURGERY    . BREAST BIOPSY     . CARPAL TUNNEL RELEASE Bilateral   . CESAREAN SECTION  1989  . ESOPHAGOGASTRODUODENOSCOPY (EGD) WITH ESOPHAGEAL DILATION    . HIP PINNING,CANNULATED Right 01/26/2017   Procedure: CANNULATED SCREWS/RIGHT HIP PINNING;  Surgeon: Mcarthur Rossetti, MD;  Location: [place];  Service: Orthopedics;  Laterality: Right;  . INNER EAR SURGERY Right 1995  . LUMBAR LAMINECTOMY Right 1999   [name]  . MASTECTOMY Bilateral     There were no vitals filed for this visit.      Subjective Assessment - 05/04/17 1241    Subjective I see Dr. Vertell Limber on 05/11/2017.    Pertinent History Rt hip fracture with pin placement: 01/26/17   Limitations Walking   How long can you stand comfortably? 10 minutes   How long can you walk comfortably? <5 minutes    Patient Stated Goals improve Rt hip strength, walk without cane, walk longer  so I can return to work   Currently in Pain? Yes   Pain Score 6    Pain Location Hip   Pain Orientation Right   Pain Descriptors / Indicators Sore   Pain Type Surgical pain   Pain Onset More than a month ago   Pain Frequency Constant   Aggravating Factors  sitting too long, walking  Pain Relieving Factors stretching, rest   Multiple Pain Sites No                         [facility] Adult PT Treatment/Exercise - 05/04/17 0001      Lumbar Exercises: Standing   Other Standing Lumbar Exercises rebounder 3 ways 1 min.      Lumbar Exercises: Supine   Bridge 20 reps   Bridge Limitations able to go higher     Knee/Hip Exercises: Standing   Lateral Step Up Right;Hand Hold: 1;1 set;15 reps   Forward Step Up 15 reps;Hand Hold: 1;Right;Step Height: 4"   Wall Squat 10 reps   Wall Squat Limitations green ball at back; mini squat   Rocker Board 2 minutes   Rocker Board Limitations VC to move at ankles     Manual Therapy   Manual Therapy Passive ROM;Joint mobilization   Joint Mobilization right hip distraction, post. glide, inferior glide grade 3   Passive ROM right  hip for all directions                  PT Short Term Goals - 05/04/17 1327      PT SHORT TERM GOAL #1   Title be independent in initial HEP   Time 4   Period Weeks   Status Achieved     PT SHORT TERM GOAL #2   Title demonstrate symmetry with ambulation on level surface with use of cane   Time 4   Period Weeks   Status On-going     PT SHORT TERM GOAL #3   Title report < or = to 3/10 Rt hip pain with standing and walking   Time 4   Period Weeks   Status On-going     PT SHORT TERM GOAL #4   Title improve strength and reduce pain to walk for 10 minutes without rest   Time 4   Period Weeks   Status On-going           PT Long Term Goals - 04/01/17 1135      PT LONG TERM GOAL #1   Title be independent in advanced HEP   Time 8   Period Weeks   Status New     PT LONG TERM GOAL #2   Title reduce FOTO to < or = to 44% limitation   Time 8   Period Weeks   Status New     PT LONG TERM GOAL #3   Title stand and walk for 30 minutes without rest    Time 8   Period Weeks   Status New     PT LONG TERM GOAL #4   Title demonstrate 4+/5 Rt hip flexor strength to improve mobility and transitions   Time 8   Period Weeks   Status New     PT LONG TERM GOAL #5   Title report a 75% reduction in Rt LE pain with standing and walking   Time 8   Period Weeks   Status New     Additional Long Term Goals   Additional Long Term Goals Yes     PT LONG TERM GOAL #6   Title improve strength to negotiate steps with step-over-step gait   Time 8   Period Weeks   Status New               Plan - 05/04/17 1318    Clinical Impression Statement Patient is very fearful.  Patient keeps his right leg abducted while walking. Patient has increased pain with hip rotation.  Patient needs alot of encouragement. Patient is able to go higher with her bridges.  Patient will benefit from skilled therapy to pain and improve strength.    Rehab Potential Good   PT Frequency 2x /  week   PT Duration 8 weeks   PT Treatment/Interventions ADLs/Self Care Home Management;Cryotherapy;Electrical Stimulation;Moist Heat;Ultrasound;Gait training;Stair training;Functional mobility training;Therapeutic activities;Therapeutic exercise;Neuromuscular re-education;Passive range of motion;Manual techniques;Dry needling;Taping;Balance training   PT Next Visit Plan joint mobs to right hip, stretch right hip. weight shifting, hip abduction exercises   PT Home Exercise Plan progress as needed   Consulted and Agree with Plan of Care Patient      Patient will benefit from skilled therapeutic intervention in order to improve the following deficits and impairments:  Abnormal gait, Decreased range of motion, Difficulty walking, Pain, Decreased mobility, Decreased strength, Decreased scar mobility, Impaired flexibility, Improper body mechanics, Decreased activity tolerance, Increased muscle spasms, Decreased endurance  Visit Diagnosis: Muscle weakness (generalized)  Stiffness of right hip, not elsewhere classified  Other abnormalities of gait and mobility     Problem List Patient Active Problem List   Diagnosis Date Noted  . Hip fracture ([place]) 01/25/2017  . Closed left hip fracture ([place]) 01/25/2017  . Closed hip fracture, right, initial encounter ([place])   . Preop examination   . Fibromyalgia   . Neuropathy   . Corneal abrasion   . Nausea   . Fall   . SIRS (systemic inflammatory response syndrome) ([place]) 07/19/2015  . Seronegative myasthenia gravis ([place]) 07/19/2015  . Acute exacerbation of chronic low back pain 07/19/2015  . Leukocytosis 07/19/2015  . Myasthenia gravis in remission ([place]) 10/23/2014  . OSA on CPAP 10/23/2014  . Hypersomnia, persistent 10/23/2014  . Neurogenic hypoventilation 08/14/2014  . Hypoxemia 08/14/2014  . Myasthenia gravis with exacerbation ([place]) 08/14/2014  . Myasthenia gravis with exacerbation, ocular ([place]) 12/08/2012  . Right shoulder pain 08/02/2012   . 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  . History of optic neuritis 06/27/2012  . Rosacea 06/27/2012  . S/P laminectomy 06/27/2012  . 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 S/P bilateral mastectomy 06/26/2012  . BRCA negative 06/26/2012  . GERD (gastroesophageal reflux disease) 06/26/2012  . Menopause 06/26/2012  . Ankle pain 06/02/2012    Earlie Counts, PT 05/04/17 1:28 PM   [facility] [address] [place], [place], [address] Phone: [phone]   Fax:  [phone]  Name: AKILA BATTA MRN: [id number] Date of Birth: 12/25/1955

## 2017-05-05 DIAGNOSIS — H1013 Acute atopic conjunctivitis, bilateral: Secondary | ICD-10-CM | POA: Diagnosis not present

## 2017-05-05 DIAGNOSIS — H0011 Chalazion right upper eyelid: Secondary | ICD-10-CM | POA: Diagnosis not present

## 2017-05-05 MED FILL — FLUOROMETHOLONE 0.1% DROPS: 0.1 | 10 days supply | Qty: 5 | Fill #0

## 2017-05-07 ENCOUNTER — Encounter: Payer: Self-pay | Admitting: Physical Therapy

## 2017-05-07 ENCOUNTER — Ambulatory Visit: Payer: 59 | Admitting: Physical Therapy

## 2017-05-07 DIAGNOSIS — M25651 Stiffness of right hip, not elsewhere classified: Secondary | ICD-10-CM | POA: Diagnosis not present

## 2017-05-07 DIAGNOSIS — M6281 Muscle weakness (generalized): Secondary | ICD-10-CM | POA: Diagnosis not present

## 2017-05-07 DIAGNOSIS — R2689 Other abnormalities of gait and mobility: Secondary | ICD-10-CM | POA: Diagnosis not present

## 2017-05-07 NOTE — Therapy (Signed)
[facility] [address] [place], [place], [address] Phone: [phone]   Fax:  [phone]  Physical Therapy Treatment  Patient Details  Name: Felicia Cain MRN: [id number] Date of Birth: [birth year] Referring Provider: Dr. Jean Rosenthal  Encounter Date: 05/07/2017      PT End of Session - 05/07/17 1139    Visit Number 8   Date for PT Re-Evaluation 05/27/17   Authorization Type UMR   PT Start Time 1100   PT Stop Time 1155   PT Time Calculation (min) 55 min   Activity Tolerance Patient tolerated treatment well   Behavior During Therapy [facility] for tasks assessed/performed      Past Medical History:  Diagnosis Date  . Anxiety   . Arthritis    "back" (01/25/2017)  . Brachial neuritis    neuropathy  . Chronic lower back pain   . DCIS (ductal carcinoma in situ)    "left side?"  . Esophageal stricture    stricture with dysphasia  . Fibromyalgia   . GERD (gastroesophageal reflux disease)   . H/O Doppler ultrasound 2006   see scanned study  . H/O echocardiogram 2004, 2013  . Hepatitis A 1961   [place]"  . History of cardiac monitoring 2006  . History of nuclear stress test 2004   see scanned study  . Migraine    "in the past; cycle related" (01/25/2017)  . Myasthenia gravis in crisis [facility]) 04/2014   respiratory crisis  . NAION (non-arteritic anterior ischemic optic neuropathy), right   . Near syncope   . Neuromuscular disorder ([place])   . Neuropathy   . Optic neuritis    ischaemic optic neuritis, non arteric  . Optic neuropathy   . PONV (postoperative nausea and vomiting)   . Posterior optic neuritis   . Ptosis of eyelid   . Sleep apnea    "I wear Moses device; I don't wear CPAP" (01/25/2017)  . Spinal stenosis     Past Surgical History:  Procedure Laterality Date  . AUGMENTATION MAMMAPLASTY     2000, after breast cancer surgery,redone 2010  . BACK SURGERY    . BREAST BIOPSY     . CARPAL TUNNEL RELEASE Bilateral   . CESAREAN SECTION  1989  . ESOPHAGOGASTRODUODENOSCOPY (EGD) WITH ESOPHAGEAL DILATION    . HIP PINNING,CANNULATED Right 01/26/2017   Procedure: CANNULATED SCREWS/RIGHT HIP PINNING;  Surgeon: Mcarthur Rossetti, MD;  Location: [place];  Service: Orthopedics;  Laterality: Right;  . INNER EAR SURGERY Right 1995  . LUMBAR LAMINECTOMY Right 1999   [name]  . MASTECTOMY Bilateral     There were no vitals filed for this visit.      Subjective Assessment - 05/07/17 1107    Subjective My right knee is hurting.  Everymove of my right hip increases the pain. When I stretch the left hip there is more pain in the right knee. I took tramadol and tylenol today. I was unable to sleep due to the right knee pain.  The pain started yesterday.    Pertinent History Rt hip fracture with pin placement: 01/26/17   Limitations Walking   How long can you stand comfortably? 10 minutes   How long can you walk comfortably? <5 minutes    Patient Stated Goals improve Rt hip strength, walk without cane, walk longer  so I can return to work   Currently in Pain? Yes   Pain Score 4    Pain  Location Hip   Pain Orientation Right   Pain Descriptors / Indicators Sore   Pain Type Surgical pain   Pain Onset More than a month ago   Pain Frequency Constant   Aggravating Factors  sitting too long, walking   Pain Relieving Factors stretch, rest   Multiple Pain Sites Yes   Pain Score 8   Pain Location Knee   Pain Orientation Right   Pain Descriptors / Indicators Constant   Pain Type Acute pain   Pain Onset More than a month ago   Pain Frequency Constant   Aggravating Factors  hip movements   Pain Relieving Factors nothing                         [facility] Adult PT Treatment/Exercise - 05/07/17 0001      Lumbar Exercises: Supine   Clam 20 reps   Clam Limitations therapist assisted in bringing right knee inward   Bridge 20 reps   Bridge Limitations able to go  higher     Knee/Hip Exercises: Standing   Lateral Step Up Right;Hand Hold: 1;1 set;15 reps   Forward Step Up 15 reps;Hand Hold: 1;Right;Step Height: 4"   Rocker Board 2 minutes   Rocker Board Limitations VC to move at ankles   Rebounder 3 ways 1 min each     Modalities   Modalities Electrical Stimulation;Moist Heat     Moist Heat Therapy   Number Minutes Moist Heat 15 Minutes   Moist Heat Location Hip  anterior     Electrical Stimulation   Electrical Stimulation Location anterior right hip and knee   Electrical Stimulation Action IFC   Electrical Stimulation Parameters 15  min; to patient tolerance   Electrical Stimulation Goals Pain                  PT Short Term Goals - 05/04/17 1327      PT SHORT TERM GOAL #1   Title be independent in initial HEP   Time 4   Period Weeks   Status Achieved     PT SHORT TERM GOAL #2   Title demonstrate symmetry with ambulation on level surface with use of cane   Time 4   Period Weeks   Status On-going     PT SHORT TERM GOAL #3   Title report < or = to 3/10 Rt hip pain with standing and walking   Time 4   Period Weeks   Status On-going     PT SHORT TERM GOAL #4   Title improve strength and reduce pain to walk for 10 minutes without rest   Time 4   Period Weeks   Status On-going           PT Long Term Goals - 04/01/17 1135      PT LONG TERM GOAL #1   Title be independent in advanced HEP   Time 8   Period Weeks   Status New     PT LONG TERM GOAL #2   Title reduce FOTO to < or = to 44% limitation   Time 8   Period Weeks   Status New     PT LONG TERM GOAL #3   Title stand and walk for 30 minutes without rest    Time 8   Period Weeks   Status New     PT LONG TERM GOAL #4   Title demonstrate 4+/5 Rt hip flexor strength to improve  mobility and transitions   Time 8   Period Weeks   Status New     PT LONG TERM GOAL #5   Title report a 75% reduction in Rt LE pain with standing and walking   Time 8    Period Weeks   Status New     Additional Long Term Goals   Additional Long Term Goals Yes     PT LONG TERM GOAL #6   Title improve strength to negotiate steps with step-over-step gait   Time 8   Period Weeks   Status New               Plan - 05/07/17 1140    Clinical Impression Statement Patient is having alot of right knee pain with hip movement making it difficult for her to progress. Patient is not able to bring her right knee inward and needs assistance due to right knee pain.  Patient continues to have a tight right hip capsule.  Patient will benefit from skilled therapy to reduce pain and improve strength.    Rehab Potential Good   PT Frequency 2x / week   PT Duration 8 weeks   PT Treatment/Interventions ADLs/Self Care Home Management;Cryotherapy;Electrical Stimulation;Moist Heat;Ultrasound;Gait training;Stair training;Functional mobility training;Therapeutic activities;Therapeutic exercise;Neuromuscular re-education;Passive range of motion;Manual techniques;Dry needling;Taping;Balance training   PT Next Visit Plan See what Dr. Vertell Limber says about her back; continue to work on right hip strength and mobility; joint mobilization to right hip   PT Home Exercise Plan progress as needed   Consulted and Agree with Plan of Care Patient      Patient will benefit from skilled therapeutic intervention in order to improve the following deficits and impairments:  Abnormal gait, Decreased range of motion, Difficulty walking, Pain, Decreased mobility, Decreased strength, Decreased scar mobility, Impaired flexibility, Improper body mechanics, Decreased activity tolerance, Increased muscle spasms, Decreased endurance  Visit Diagnosis: Muscle weakness (generalized)  Stiffness of right hip, not elsewhere classified  Other abnormalities of gait and mobility     Problem List Patient Active Problem List   Diagnosis Date Noted  . Hip fracture ([place]) 01/25/2017  . Closed left hip  fracture ([place]) 01/25/2017  . Closed hip fracture, right, initial encounter ([place])   . Preop examination   . Fibromyalgia   . Neuropathy   . Corneal abrasion   . Nausea   . Fall   . SIRS (systemic inflammatory response syndrome) ([place]) 07/19/2015  . Seronegative myasthenia gravis ([place]) 07/19/2015  . Acute exacerbation of chronic low back pain 07/19/2015  . Leukocytosis 07/19/2015  . Myasthenia gravis in remission ([place]) 10/23/2014  . OSA on CPAP 10/23/2014  . Hypersomnia, persistent 10/23/2014  . Neurogenic hypoventilation 08/14/2014  . Hypoxemia 08/14/2014  . Myasthenia gravis with exacerbation ([place]) 08/14/2014  . Myasthenia gravis with exacerbation, ocular ([place]) 12/08/2012  . Right shoulder pain 08/02/2012  . 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  . History of optic neuritis 06/27/2012  . Rosacea 06/27/2012  . S/P laminectomy 06/27/2012  . 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 S/P bilateral mastectomy 06/26/2012  . BRCA negative 06/26/2012  . GERD (gastroesophageal reflux disease) 06/26/2012  . Menopause 06/26/2012  . Ankle pain 06/02/2012    Earlie Counts, PT 05/07/17 11:47 AM   [facility] [address] [place], [place], [address] Phone: [phone]   Fax:  [phone]  Name: Felicia Cain MRN: [id number] Date of Birth: [birth year]

## 2017-05-11 ENCOUNTER — Ambulatory Visit: Payer: 59 | Attending: Orthopaedic Surgery | Admitting: Physical Therapy

## 2017-05-11 ENCOUNTER — Encounter: Payer: Self-pay | Admitting: Physical Therapy

## 2017-05-11 DIAGNOSIS — M6281 Muscle weakness (generalized): Secondary | ICD-10-CM | POA: Insufficient documentation

## 2017-05-11 DIAGNOSIS — M5137 Other intervertebral disc degeneration, lumbosacral region: Secondary | ICD-10-CM | POA: Diagnosis not present

## 2017-05-11 DIAGNOSIS — M545 Low back pain: Secondary | ICD-10-CM | POA: Diagnosis not present

## 2017-05-11 DIAGNOSIS — R2689 Other abnormalities of gait and mobility: Secondary | ICD-10-CM | POA: Insufficient documentation

## 2017-05-11 DIAGNOSIS — M5416 Radiculopathy, lumbar region: Secondary | ICD-10-CM | POA: Diagnosis not present

## 2017-05-11 DIAGNOSIS — M25651 Stiffness of right hip, not elsewhere classified: Secondary | ICD-10-CM | POA: Diagnosis not present

## 2017-05-11 NOTE — Therapy (Signed)
[facility] [address], [place], [place], [address] Phone: [phone]   Fax:  [phone]  Physical Therapy Treatment  Patient Details  Name: Felicia Cain MRN: [id number] Date of Birth: 05-02-1956 Referring Provider: Dr. Jean Rosenthal  Encounter Date: 05/11/2017      PT End of Session - 05/11/17 1317    Visit Number 9   Date for PT Re-Evaluation 05/27/17   Authorization Type UMR   PT Start Time 5670   PT Stop Time 1315   PT Time Calculation (min) 40 min   Activity Tolerance Patient tolerated treatment well   Behavior During Therapy [facility] for tasks assessed/performed      Past Medical History:  Diagnosis Date  . Anxiety   . Arthritis    "back" (01/25/2017)  . Brachial neuritis    neuropathy  . Chronic lower back pain   . DCIS (ductal carcinoma in situ)    "left side?"  . Esophageal stricture    stricture with dysphasia  . Fibromyalgia   . GERD (gastroesophageal reflux disease)   . H/O Doppler ultrasound 2006   see scanned study  . H/O echocardiogram 2004, 2013  . Hepatitis A 1961   [place]  . History of cardiac monitoring 2006  . History of nuclear stress test 2004   see scanned study  . Migraine    "in the past; cycle related" (01/25/2017)  . Myasthenia gravis in crisis [facility]) 04/2014   respiratory crisis  . NAION (non-arteritic anterior ischemic optic neuropathy), right   . Near syncope   . Neuromuscular disorder ([place])   . Neuropathy   . Optic neuritis    ischaemic optic neuritis, non arteric  . Optic neuropathy   . PONV (postoperative nausea and vomiting)   . Posterior optic neuritis   . Ptosis of eyelid   . Sleep apnea    "I wear Moses device; I don't wear CPAP" (01/25/2017)  . Spinal stenosis     Past Surgical History:  Procedure Laterality Date  . AUGMENTATION MAMMAPLASTY     2000, after breast cancer surgery,redone 2010  . BACK SURGERY    . BREAST BIOPSY     . CARPAL TUNNEL RELEASE Bilateral   . CESAREAN SECTION  1989  . ESOPHAGOGASTRODUODENOSCOPY (EGD) WITH ESOPHAGEAL DILATION    . HIP PINNING,CANNULATED Right 01/26/2017   Procedure: CANNULATED SCREWS/RIGHT HIP PINNING;  Surgeon: Mcarthur Rossetti, MD;  Location: [facility];  Service: Orthopedics;  Laterality: Right;  . INNER EAR SURGERY Right 1995  . LUMBAR LAMINECTOMY Right 1999   [name]  . MASTECTOMY Bilateral     There were no vitals filed for this visit.      Subjective Assessment - 05/11/17 1236    Subjective I feel like my pain is from my hip.  I see Dr. Vertell Limber at 4 PM. I drove yesterday and I tolerated it well until I went for 40 minutes.    Pertinent History Rt hip fracture with pin placement: 01/26/17   Limitations Walking   How long can you stand comfortably? 10 minutes   How long can you walk comfortably? <5 minutes    Patient Stated Goals improve Rt hip strength, walk without cane, walk longer  so I can return to work   Currently in Pain? Yes   Pain Score 5    Pain Location Hip   Pain Orientation Right   Pain Descriptors / Indicators Sore   Pain Type Surgical  pain   Pain Onset More than a month ago   Pain Frequency Constant   Aggravating Factors  sitting too long, walking   Pain Relieving Factors stretch, rest   Multiple Pain Sites Yes   Pain Score 8   Pain Location Knee   Pain Orientation Right   Pain Descriptors / Indicators Sharp;Shooting   Pain Type Acute pain   Pain Onset More than a month ago   Pain Frequency Intermittent   Aggravating Factors  hip movement especially extension   Pain Relieving Factors no hip extension                         [facility] Adult PT Treatment/Exercise - 05/11/17 0001      Knee/Hip Exercises: Stretches   Hip Flexor Stretch Right;5 reps;10 seconds  in the doorway going slowly     Knee/Hip Exercises: Standing   Lateral Step Up Right;Hand Hold: 1;1 set;15 reps;Step Height: 4"   Forward Step Up 15 reps;Hand  Hold: 1;Right;Step Height: 4"   Rocker Board 2 minutes   Rocker Board Limitations VC to move at ankles   Rebounder 3 ways 1 min each     Knee/Hip Exercises: Supine   Bridges Limitations 10 bridges   Bridges with Clamshell Strengthening;Both;1 set;10 reps     Knee/Hip Exercises: Prone   Hip Extension Strengthening;Right;2 sets;10 reps   Hip Extension Limitations 10x knee bent 10x knee straight     Manual Therapy   Manual Therapy Joint mobilization;Passive ROM   Manual therapy comments manually stretched hip rotators and and hip extension    Joint Mobilization anterior glide to right hip grade 3; distraction of right hip; lateral glide of right hip   Soft tissue mobilization anterior right hip and quads   Passive ROM right hip rotation ; right hip flexion                  PT Short Term Goals - 05/04/17 1327      PT SHORT TERM GOAL #1   Title be independent in initial HEP   Time 4   Period Weeks   Status Achieved     PT SHORT TERM GOAL #2   Title demonstrate symmetry with ambulation on level surface with use of cane   Time 4   Period Weeks   Status On-going     PT SHORT TERM GOAL #3   Title report < or = to 3/10 Rt hip pain with standing and walking   Time 4   Period Weeks   Status On-going     PT SHORT TERM GOAL #4   Title improve strength and reduce pain to walk for 10 minutes without rest   Time 4   Period Weeks   Status On-going           PT Long Term Goals - 05/11/17 1320      PT LONG TERM GOAL #1   Title be independent in advanced HEP   Time 8   Period Weeks   Status On-going     PT LONG TERM GOAL #2   Title reduce FOTO to < or = to 44% limitation   Time 8   Period Weeks   Status New     PT LONG TERM GOAL #3   Title stand and walk for 30 minutes without rest    Time 8   Period Weeks   Status On-going     PT LONG TERM  GOAL #4   Title demonstrate 4+/5 Rt hip flexor strength to improve mobility and transitions   Time 8   Period  Weeks   Status On-going     PT LONG TERM GOAL #5   Title report a 75% reduction in Rt LE pain with standing and walking   Time 8   Period Weeks   Status On-going     PT LONG TERM GOAL #6   Title improve strength to negotiate steps with step-over-step gait   Time 8   Period Weeks   Status On-going               Plan - 05/11/17 1317    Clinical Impression Statement Patient has nerve pain with right hip extension and rotation. Patient had a know in the right groin by the femoral triangle. Patient Patient had increased right hip flexion PROM with pain. Patient has to go slowly with hip flxor stretch in the doorway. Patient will benefit from skilled therapy to improve right hip mobility and pain.     Rehab Potential Good   PT Frequency 2x / week   PT Duration 8 weeks   PT Treatment/Interventions ADLs/Self Care Home Management;Cryotherapy;Electrical Stimulation;Moist Heat;Ultrasound;Gait training;Stair training;Functional mobility training;Therapeutic activities;Therapeutic exercise;Neuromuscular re-education;Passive range of motion;Manual techniques;Dry needling;Taping;Balance training   PT Next Visit Plan See what Dr. Vertell Limber says about her back; continue to work on right hip strength and mobility; joint mobilization to right hip   PT Home Exercise Plan progress as needed   Consulted and Agree with Plan of Care Patient      Patient will benefit from skilled therapeutic intervention in order to improve the following deficits and impairments:  Abnormal gait, Decreased range of motion, Difficulty walking, Pain, Decreased mobility, Decreased strength, Decreased scar mobility, Impaired flexibility, Improper body mechanics, Decreased activity tolerance, Increased muscle spasms, Decreased endurance  Visit Diagnosis: Muscle weakness (generalized)  Stiffness of right hip, not elsewhere classified  Other abnormalities of gait and mobility     Problem List Patient Active Problem List    Diagnosis Date Noted  . Hip fracture ([place]) 01/25/2017  . Closed left hip fracture ([place]) 01/25/2017  . Closed hip fracture, right, initial encounter ([place])   . Preop examination   . Fibromyalgia   . Neuropathy   . Corneal abrasion   . Nausea   . Fall   . SIRS (systemic inflammatory response syndrome) ([place]) 07/19/2015  . Seronegative myasthenia gravis ([place]) 07/19/2015  . Acute exacerbation of chronic low back pain 07/19/2015  . Leukocytosis 07/19/2015  . Myasthenia gravis in remission ([place]) 10/23/2014  . OSA on CPAP 10/23/2014  . Hypersomnia, persistent 10/23/2014  . Neurogenic hypoventilation 08/14/2014  . Hypoxemia 08/14/2014  . Myasthenia gravis with exacerbation ([place]) 08/14/2014  . Myasthenia gravis with exacerbation, ocular ([place]) 12/08/2012  . Right shoulder pain 08/02/2012  . 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  . History of optic neuritis 06/27/2012  . Rosacea 06/27/2012  . S/P laminectomy 06/27/2012  . 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 S/P bilateral mastectomy 06/26/2012  . BRCA negative 06/26/2012  . GERD (gastroesophageal reflux disease) 06/26/2012  . Menopause 06/26/2012  . Ankle pain 06/02/2012    Earlie Counts, PT 05/11/17 1:21 PM   [facility] [address], [place], [place], [address] Phone: [phone]   Fax:  [phone]  Name: Felicia Cain MRN: [id number] Date of Birth: [birth year]

## 2017-05-14 ENCOUNTER — Ambulatory Visit: Payer: 59 | Admitting: Physical Therapy

## 2017-05-14 ENCOUNTER — Encounter: Payer: Self-pay | Admitting: Physical Therapy

## 2017-05-14 DIAGNOSIS — M6281 Muscle weakness (generalized): Secondary | ICD-10-CM

## 2017-05-14 DIAGNOSIS — M25651 Stiffness of right hip, not elsewhere classified: Secondary | ICD-10-CM | POA: Diagnosis not present

## 2017-05-14 DIAGNOSIS — R2689 Other abnormalities of gait and mobility: Secondary | ICD-10-CM | POA: Diagnosis not present

## 2017-05-14 NOTE — Therapy (Addendum)
[facility] [address] [place], [place], [address] Phone: [phone]   Fax:  [phone]  Physical Therapy Treatment  Patient Details  Name: Felicia Cain MRN: [id number] Date of Birth: 1956-08-10 Referring Provider: Dr. Jean Rosenthal  Encounter Date: 05/14/2017      PT End of Session - 05/14/17 1105    Visit Number 10   Date for PT Re-Evaluation 05/27/17   Authorization Type UMR   PT Start Time 1105  came late   PT Stop Time 1145   PT Time Calculation (min) 40 min   Activity Tolerance Patient tolerated treatment well   Behavior During Therapy [facility] for tasks assessed/performed      Past Medical History:  Diagnosis Date  . Anxiety   . Arthritis    "back" (01/25/2017)  . Brachial neuritis    neuropathy  . Chronic lower back pain   . DCIS (ductal carcinoma in situ)    "left side?"  . Esophageal stricture    stricture with dysphasia  . Fibromyalgia   . GERD (gastroesophageal reflux disease)   . H/O Doppler ultrasound 2006   see scanned study  . H/O echocardiogram 2004, 2013  . Hepatitis A 1961   [place]"  . History of cardiac monitoring 2006  . History of nuclear stress test 2004   see scanned study  . Migraine    "in the past; cycle related" (01/25/2017)  . Myasthenia gravis in crisis [facility]) 04/2014   respiratory crisis  . NAION (non-arteritic anterior ischemic optic neuropathy), right   . Near syncope   . Neuromuscular disorder ([place])   . Neuropathy   . Optic neuritis    ischaemic optic neuritis, non arteric  . Optic neuropathy   . PONV (postoperative nausea and vomiting)   . Posterior optic neuritis   . Ptosis of eyelid   . Sleep apnea    "I wear Moses device; I don't wear CPAP" (01/25/2017)  . Spinal stenosis     Past Surgical History:  Procedure Laterality Date  . AUGMENTATION MAMMAPLASTY     2000, after breast cancer surgery,redone 2010  . BACK SURGERY    .  BREAST BIOPSY    . CARPAL TUNNEL RELEASE Bilateral   . CESAREAN SECTION  1989  . ESOPHAGOGASTRODUODENOSCOPY (EGD) WITH ESOPHAGEAL DILATION    . HIP PINNING,CANNULATED Right 01/26/2017   Procedure: CANNULATED SCREWS/RIGHT HIP PINNING;  Surgeon: Mcarthur Rossetti, MD;  Location: [facility];  Service: Orthopedics;  Laterality: Right;  . INNER EAR SURGERY Right 1995  . LUMBAR LAMINECTOMY Right 1999   [name]  . MASTECTOMY Bilateral     There were no vitals filed for this visit.      Subjective Assessment - 05/14/17 1109    Subjective MD says right leg pain is coming more from the right hip than back. MD wants me to do physical therapy to the back. Morning the back is worse.  After last session I was able to walk more.    Pertinent History Rt hip fracture with pin placement: 01/26/17   Limitations Walking   How long can you stand comfortably? 10 minutes   How long can you walk comfortably? <5 minutes    Patient Stated Goals improve Rt hip strength, walk without cane, walk longer  so I can return to work   Currently in Pain? Yes   Pain Score 6    Pain Location Hip   Pain Orientation Right  Pain Descriptors / Indicators Sore   Pain Type Surgical pain   Pain Onset More than a month ago   Pain Frequency Constant   Aggravating Factors  sitting too long, walking   Pain Relieving Factors stretch, rest   Multiple Pain Sites No                         [facility] Adult PT Treatment/Exercise - 05/14/17 0001      Lumbar Exercises: Aerobic   Elliptical not on 2 min with therapist guarding patient     Lumbar Exercises: Supine   Other Supine Lumbar Exercises hip abduction 20 times with PT ; heel slides 10x; knee striaght moving foot in and out   Other Supine Lumbar Exercises lay with foam roll across the buttocks working on right hip extension     Lumbar Exercises: Prone   Other Prone Lumbar Exercises hip IR/ER on right     Knee/Hip Exercises: Stretches   Other Knee/Hip  Stretches kneeling to tall kneel with abdominal contraction 15 times     Knee/Hip Exercises: Standing   Rebounder 3 ways 1 min each     Knee/Hip Exercises: Prone   Hip Extension Strengthening;Right;Left;20 reps;2 sets;Limitations   Hip Extension Limitations therapist guiding patient to not strain back                  PT Short Term Goals - 05/14/17 1150      PT SHORT TERM GOAL #2   Title demonstrate symmetry with ambulation on level surface with use of cane   Time 4   Period Weeks   Status Achieved           PT Long Term Goals - 05/11/17 1320      PT LONG TERM GOAL #1   Title be independent in advanced HEP   Time 8   Period Weeks   Status On-going     PT LONG TERM GOAL #2   Title reduce FOTO to < or = to 44% limitation   Time 8   Period Weeks   Status New     PT LONG TERM GOAL #3   Title stand and walk for 30 minutes without rest    Time 8   Period Weeks   Status On-going     PT LONG TERM GOAL #4   Title demonstrate 4+/5 Rt hip flexor strength to improve mobility and transitions   Time 8   Period Weeks   Status On-going     PT LONG TERM GOAL #5   Title report a 75% reduction in Rt LE pain with standing and walking   Time 8   Period Weeks   Status On-going     PT LONG TERM GOAL #6   Title improve strength to negotiate steps with step-over-step gait   Time 8   Period Weeks   Status On-going               Plan - 05/14/17 1106    Clinical Impression Statement After therapy, patient was able to walk without her cane with no limp and good right hip extension.  Patient needs alot of encouragement due to the right groin pain.  Patient needs verbal cues to contract gluteals with hip extension. Patient right knee pain is not coming from her back as per Dr. Vertell Limber.  Patient will benefit from skilled therapy to improve right hip mobility and reduce pain to improve function.  Rehab Potential Good   PT Frequency 2x / week   PT Duration 8 weeks    PT Treatment/Interventions ADLs/Self Care Home Management;Cryotherapy;Electrical Stimulation;Moist Heat;Ultrasound;Gait training;Stair training;Functional mobility training;Therapeutic activities;Therapeutic exercise;Neuromuscular re-education;Passive range of motion;Manual techniques;Dry needling;Taping;Balance training   PT Next Visit Plan  continue to work on right hip strength and mobility; joint mobilization to right hip; measure right hip PROM   PT Home Exercise Plan progress as needed   Consulted and Agree with Plan of Care Patient      Patient will benefit from skilled therapeutic intervention in order to improve the following deficits and impairments:  Abnormal gait, Decreased range of motion, Difficulty walking, Pain, Decreased mobility, Decreased strength, Decreased scar mobility, Impaired flexibility, Improper body mechanics, Decreased activity tolerance, Increased muscle spasms, Decreased endurance  Visit Diagnosis: Muscle weakness (generalized)  Stiffness of right hip, not elsewhere classified  Other abnormalities of gait and mobility     Problem List Patient Active Problem List   Diagnosis Date Noted  . Hip fracture ([place]) 01/25/2017  . Closed left hip fracture ([place]) 01/25/2017  . Closed hip fracture, right, initial encounter ([place])   . Preop examination   . Fibromyalgia   . Neuropathy   . Corneal abrasion   . Nausea   . Fall   . SIRS (systemic inflammatory response syndrome) ([place]) 07/19/2015  . Seronegative myasthenia gravis ([place]) 07/19/2015  . Acute exacerbation of chronic low back pain 07/19/2015  . Leukocytosis 07/19/2015  . Myasthenia gravis in remission ([place]) 10/23/2014  . OSA on CPAP 10/23/2014  . Hypersomnia, persistent 10/23/2014  . Neurogenic hypoventilation 08/14/2014  . Hypoxemia 08/14/2014  . Myasthenia gravis with exacerbation ([place]) 08/14/2014  . Myasthenia gravis with exacerbation, ocular ([place]) 12/08/2012  . Right shoulder pain 08/02/2012  .  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  . History of optic neuritis 06/27/2012  . Rosacea 06/27/2012  . S/P laminectomy 06/27/2012  . 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 S/P bilateral mastectomy 06/26/2012  . BRCA negative 06/26/2012  . GERD (gastroesophageal reflux disease) 06/26/2012  . Menopause 06/26/2012  . Ankle pain 06/02/2012    Earlie Counts, PT 05/14/17 11:51 AM   [facility] [address], [place], [place], [address] Phone: [phone]   Fax:  [phone]  Name: Felicia Cain MRN: [id number] Date of Birth: [birth year]  PHYSICAL THERAPY DISCHARGE SUMMARY  Visits from Start of Care: 10  Current functional level related to goals / functional outcomes: See above. Patient went to another facility to have physical therapy.    Remaining deficits: See above.   Education / Equipment: HEP Plan: Patient agrees to discharge.  Patient goals were not met. Patient is being discharged due to the patient's request.  Thank you for the referral. Earlie Counts, PT 09/02/17 10:50 AM  ?????

## 2017-05-19 ENCOUNTER — Encounter: Payer: 59 | Admitting: Physical Therapy

## 2017-05-20 MED FILL — AMOXICILLIN 500 MG CAPSULE: 500 | 2 days supply | Qty: 8 | Fill #0

## 2017-05-21 ENCOUNTER — Encounter: Payer: 59 | Admitting: Physical Therapy

## 2017-05-24 ENCOUNTER — Ambulatory Visit (INDEPENDENT_AMBULATORY_CARE_PROVIDER_SITE_OTHER): Payer: Self-pay

## 2017-05-24 ENCOUNTER — Encounter: Payer: 59 | Admitting: Physical Therapy

## 2017-05-24 ENCOUNTER — Ambulatory Visit (INDEPENDENT_AMBULATORY_CARE_PROVIDER_SITE_OTHER): Payer: 59 | Admitting: Orthopaedic Surgery

## 2017-05-24 DIAGNOSIS — M25561 Pain in right knee: Secondary | ICD-10-CM

## 2017-05-24 DIAGNOSIS — S72001D Fracture of unspecified part of neck of right femur, subsequent encounter for closed fracture with routine healing: Secondary | ICD-10-CM | POA: Diagnosis not present

## 2017-05-24 NOTE — Progress Notes (Signed)
The patient is getting close to 4 months now status post cannulated screw fixation of a nondisplaced right hip femoral neck fracture. She is now developed debilitating pain in her right hip and her right knee. She examined with a cane. She's been doing well for a while but in therapy has worsen things. She feels like there may be irritation the femoral nerve. She is even seen a spine specialist does not feel that things are coming from the spine.  On exam she has severe pain with internal rotation rotation of the right hip as well as severe pain with any stress placed on the right knee.  At this point we need a CT scan of her right hip to rule out a nonunion into assess for any type of bridging callus at the femoral neck fracture. We also need MRI of her right knee to rule out ligamentous injury given how she is walking and the pain she is having around her knee. She will call once these studies are obtained.

## 2017-05-24 NOTE — Addendum Note (Signed)
Addended by: Jacklyn Shell on: 05/24/2017 09:01 AM   Modules accepted: Orders

## 2017-05-25 ENCOUNTER — Other Ambulatory Visit (INDEPENDENT_AMBULATORY_CARE_PROVIDER_SITE_OTHER): Payer: Self-pay

## 2017-05-25 DIAGNOSIS — G8929 Other chronic pain: Secondary | ICD-10-CM

## 2017-05-25 DIAGNOSIS — S72001G Fracture of unspecified part of neck of right femur, subsequent encounter for closed fracture with delayed healing: Secondary | ICD-10-CM

## 2017-05-25 DIAGNOSIS — M25561 Pain in right knee: Principal | ICD-10-CM

## 2017-05-26 ENCOUNTER — Ambulatory Visit: Payer: 59 | Admitting: Physical Therapy

## 2017-06-01 ENCOUNTER — Ambulatory Visit: Payer: 59 | Admitting: Physical Therapy

## 2017-06-03 ENCOUNTER — Ambulatory Visit: Payer: 59 | Admitting: Physical Therapy

## 2017-06-04 MED FILL — DESVENLAFAXINE SUC ER 50 MG: 50 | 30 days supply | Qty: 30 | Fill #3

## 2017-06-04 MED FILL — TRETINOIN GEL MICRO 0.1% PU: 0.1 | 30 days supply | Qty: 50 | Fill #1

## 2017-06-04 MED FILL — metroNIDAZOLE 1 % GEL: 1 | 30 days supply | Qty: 55 | Fill #1

## 2017-06-04 MED FILL — AVENOVA LID & LASH SPRAY: 0.01 | 30 days supply | Qty: 40 | Fill #2

## 2017-06-04 MED FILL — DOXYCYCLINE HYC 50 MG CAP: 50 | 30 days supply | Qty: 30 | Fill #7

## 2017-06-04 MED FILL — GABAPENTIN 300 MG CAPSULE: 300 | 30 days supply | Qty: 180 | Fill #2

## 2017-06-08 ENCOUNTER — Encounter: Payer: 59 | Admitting: Physical Therapy

## 2017-06-08 ENCOUNTER — Ambulatory Visit: Payer: 59 | Admitting: Physical Therapy

## 2017-06-08 DIAGNOSIS — L738 Other specified follicular disorders: Secondary | ICD-10-CM | POA: Diagnosis not present

## 2017-06-08 DIAGNOSIS — Z23 Encounter for immunization: Secondary | ICD-10-CM | POA: Diagnosis not present

## 2017-06-08 DIAGNOSIS — L03011 Cellulitis of right finger: Secondary | ICD-10-CM | POA: Diagnosis not present

## 2017-06-08 MED FILL — DOXYCYCLINE HYC 100 MG TAB: 100 | 10 days supply | Qty: 20 | Fill #0

## 2017-06-10 ENCOUNTER — Ambulatory Visit: Payer: 59 | Admitting: Physical Therapy

## 2017-06-11 DIAGNOSIS — M180 Bilateral primary osteoarthritis of first carpometacarpal joints: Secondary | ICD-10-CM | POA: Diagnosis not present

## 2017-06-11 DIAGNOSIS — M65311 Trigger thumb, right thumb: Secondary | ICD-10-CM | POA: Diagnosis not present

## 2017-06-11 DIAGNOSIS — M79644 Pain in right finger(s): Secondary | ICD-10-CM | POA: Diagnosis not present

## 2017-06-14 ENCOUNTER — Ambulatory Visit: Payer: 59 | Admitting: Physical Therapy

## 2017-06-15 ENCOUNTER — Ambulatory Visit
Admission: RE | Admit: 2017-06-15 | Discharge: 2017-06-15 | Disposition: A | Discharge status: Home / Self Care | Payer: 59 | Source: Ambulatory Visit | Attending: Orthopaedic Surgery | Admitting: Orthopaedic Surgery

## 2017-06-15 ENCOUNTER — Other Ambulatory Visit (INDEPENDENT_AMBULATORY_CARE_PROVIDER_SITE_OTHER): Payer: Self-pay | Admitting: Orthopaedic Surgery

## 2017-06-15 DIAGNOSIS — S72041A Displaced fracture of base of neck of right femur, initial encounter for closed fracture: Secondary | ICD-10-CM | POA: Diagnosis not present

## 2017-06-15 DIAGNOSIS — G8929 Other chronic pain: Secondary | ICD-10-CM

## 2017-06-15 DIAGNOSIS — M7989 Other specified soft tissue disorders: Secondary | ICD-10-CM | POA: Diagnosis not present

## 2017-06-15 DIAGNOSIS — S72001G Fracture of unspecified part of neck of right femur, subsequent encounter for closed fracture with delayed healing: Secondary | ICD-10-CM

## 2017-06-15 DIAGNOSIS — M25561 Pain in right knee: Secondary | ICD-10-CM | POA: Diagnosis not present

## 2017-06-16 DIAGNOSIS — M545 Low back pain: Secondary | ICD-10-CM | POA: Diagnosis not present

## 2017-06-16 DIAGNOSIS — M79604 Pain in right leg: Secondary | ICD-10-CM | POA: Diagnosis not present

## 2017-06-16 DIAGNOSIS — M25561 Pain in right knee: Secondary | ICD-10-CM | POA: Diagnosis not present

## 2017-06-16 DIAGNOSIS — M542 Cervicalgia: Secondary | ICD-10-CM | POA: Diagnosis not present

## 2017-06-17 ENCOUNTER — Encounter: Payer: 59 | Admitting: Physical Therapy

## 2017-06-21 ENCOUNTER — Encounter: Payer: 59 | Admitting: Physical Therapy

## 2017-06-21 DIAGNOSIS — M180 Bilateral primary osteoarthritis of first carpometacarpal joints: Secondary | ICD-10-CM | POA: Diagnosis not present

## 2017-06-22 DIAGNOSIS — M545 Low back pain: Secondary | ICD-10-CM | POA: Diagnosis not present

## 2017-06-22 DIAGNOSIS — M25561 Pain in right knee: Secondary | ICD-10-CM | POA: Diagnosis not present

## 2017-06-22 DIAGNOSIS — M79604 Pain in right leg: Secondary | ICD-10-CM | POA: Diagnosis not present

## 2017-06-22 DIAGNOSIS — M542 Cervicalgia: Secondary | ICD-10-CM | POA: Diagnosis not present

## 2017-06-23 ENCOUNTER — Ambulatory Visit (INDEPENDENT_AMBULATORY_CARE_PROVIDER_SITE_OTHER): Payer: 59 | Admitting: Physician Assistant

## 2017-06-23 DIAGNOSIS — H01132 Eczematous dermatitis of right lower eyelid: Secondary | ICD-10-CM | POA: Diagnosis not present

## 2017-06-23 DIAGNOSIS — R202 Paresthesia of skin: Secondary | ICD-10-CM

## 2017-06-23 DIAGNOSIS — H01135 Eczematous dermatitis of left lower eyelid: Secondary | ICD-10-CM | POA: Diagnosis not present

## 2017-06-23 DIAGNOSIS — H01134 Eczematous dermatitis of left upper eyelid: Secondary | ICD-10-CM | POA: Diagnosis not present

## 2017-06-23 DIAGNOSIS — H01131 Eczematous dermatitis of right upper eyelid: Secondary | ICD-10-CM | POA: Diagnosis not present

## 2017-06-23 MED ORDER — L-METHYLFOLATE-B6-B12 3-35-2 MG PO TABS: 1.0000 | ORAL_TABLET | Freq: Every day | ORAL | Status: AC

## 2017-06-23 NOTE — Progress Notes (Signed)
Felicia Cain 5 months status post cannulated pinning right hip. She continues to have pain in the righthip and down to the right knee. She's here for follow-up after undergoing a MRI of her right knee and a CT scan of her right hip pelvis. She does state that physical therapy is helping with some deep tissue side and dry needling MRI of the right knee was reviewed with the patient shows no meniscal tear. No acute findingsthat would cause the pain that she is having.No arthritic changes throughout the knee. CT of the pelvis is reviewed personally by Dr. Ninfa Linden myself and is reviewed with the patient and shows no signs of hardware failure. The hip fracture appears well-healed. No evidence of AVN.  Impression : Status post right hip cannulated pinning hip fracture.  Right leg pain/paresthesias  Plan: She'll continue to work on range of motion strengthening right hip. Physical therapy will also work with her on deep tissue massage dry needling and desensitization. She will start Metanx o see if this helps with the paresthesia. She'll follow up with [place] in 4 weeks' check progress lack of. Questions encouraged and answered by Dr. Ninfa Linden itself.

## 2017-06-24 ENCOUNTER — Encounter: Payer: 59 | Admitting: Physical Therapy

## 2017-06-24 DIAGNOSIS — Z23 Encounter for immunization: Secondary | ICD-10-CM | POA: Diagnosis not present

## 2017-06-24 MED FILL — TACROLIMUS 0.1 % OINT: 0.1 | 21 days supply | Qty: 60 | Fill #0

## 2017-06-28 ENCOUNTER — Telehealth (INDEPENDENT_AMBULATORY_CARE_PROVIDER_SITE_OTHER): Payer: Self-pay | Admitting: Physician Assistant

## 2017-06-28 DIAGNOSIS — M180 Bilateral primary osteoarthritis of first carpometacarpal joints: Secondary | ICD-10-CM | POA: Diagnosis not present

## 2017-06-28 NOTE — Telephone Encounter (Signed)
Patient called advised the Rx for [facility] is not showing received at the pharmacy. The number to contact patient is [phone]

## 2017-06-29 ENCOUNTER — Other Ambulatory Visit (INDEPENDENT_AMBULATORY_CARE_PROVIDER_SITE_OTHER): Payer: Self-pay | Admitting: Physician Assistant

## 2017-06-29 NOTE — Telephone Encounter (Signed)
Metanx 3-35-2 mg one tab twice daily #60 one refill

## 2017-06-29 NOTE — Telephone Encounter (Signed)
Can you give me instructions on this med? I am not familiar with it

## 2017-06-29 NOTE — Telephone Encounter (Signed)
Please advise 

## 2017-06-29 NOTE — Telephone Encounter (Signed)
Please fill

## 2017-06-30 ENCOUNTER — Other Ambulatory Visit (INDEPENDENT_AMBULATORY_CARE_PROVIDER_SITE_OTHER): Payer: Self-pay

## 2017-06-30 ENCOUNTER — Telehealth (INDEPENDENT_AMBULATORY_CARE_PROVIDER_SITE_OTHER): Payer: Self-pay | Admitting: Orthopaedic Surgery

## 2017-06-30 DIAGNOSIS — M545 Low back pain: Secondary | ICD-10-CM | POA: Diagnosis not present

## 2017-06-30 DIAGNOSIS — M79604 Pain in right leg: Secondary | ICD-10-CM | POA: Diagnosis not present

## 2017-06-30 DIAGNOSIS — M542 Cervicalgia: Secondary | ICD-10-CM | POA: Diagnosis not present

## 2017-06-30 DIAGNOSIS — M25561 Pain in right knee: Secondary | ICD-10-CM | POA: Diagnosis not present

## 2017-06-30 MED ORDER — METANX 3-90.314-2-35 MG PO CAPS: 1.0000 | ORAL_CAPSULE | Freq: Two times a day (BID) | ORAL | 1 refills | Status: DC

## 2017-06-30 NOTE — Telephone Encounter (Signed)
Patient states Rx Metan X was sent to the wrong pharmacy([facility]), the correct pharmacy ([place]). Please resend to correct pharmacy per patient

## 2017-06-30 NOTE — Telephone Encounter (Signed)
sent 

## 2017-06-30 NOTE — Telephone Encounter (Signed)
Sent in to pharmacy.  

## 2017-07-01 DIAGNOSIS — M25561 Pain in right knee: Secondary | ICD-10-CM | POA: Diagnosis not present

## 2017-07-01 DIAGNOSIS — M545 Low back pain: Secondary | ICD-10-CM | POA: Diagnosis not present

## 2017-07-01 DIAGNOSIS — M79604 Pain in right leg: Secondary | ICD-10-CM | POA: Diagnosis not present

## 2017-07-01 DIAGNOSIS — M542 Cervicalgia: Secondary | ICD-10-CM | POA: Diagnosis not present

## 2017-07-07 DIAGNOSIS — M542 Cervicalgia: Secondary | ICD-10-CM | POA: Diagnosis not present

## 2017-07-07 DIAGNOSIS — M25561 Pain in right knee: Secondary | ICD-10-CM | POA: Diagnosis not present

## 2017-07-07 DIAGNOSIS — M79604 Pain in right leg: Secondary | ICD-10-CM | POA: Diagnosis not present

## 2017-07-07 DIAGNOSIS — M545 Low back pain: Secondary | ICD-10-CM | POA: Diagnosis not present

## 2017-07-14 DIAGNOSIS — M79604 Pain in right leg: Secondary | ICD-10-CM | POA: Diagnosis not present

## 2017-07-14 DIAGNOSIS — M542 Cervicalgia: Secondary | ICD-10-CM | POA: Diagnosis not present

## 2017-07-14 DIAGNOSIS — M545 Low back pain: Secondary | ICD-10-CM | POA: Diagnosis not present

## 2017-07-14 DIAGNOSIS — M25561 Pain in right knee: Secondary | ICD-10-CM | POA: Diagnosis not present

## 2017-07-14 MED FILL — LIDOCAINE-PRILOCAINE CREAM: 2.5-2.5 | 30 days supply | Qty: 30 | Fill #0

## 2017-07-14 MED FILL — ACYCLOVIR 800 MG TABLET: 800 | 2 days supply | Qty: 4 | Fill #0

## 2017-07-14 MED FILL — DESVENLAFAXINE SUC ER 50 MG: 50 | 30 days supply | Qty: 30 | Fill #4

## 2017-07-15 MED FILL — DOXYCYCLINE HYC 50 MG CAP: 50 | 30 days supply | Qty: 30 | Fill #0

## 2017-07-22 DIAGNOSIS — M79604 Pain in right leg: Secondary | ICD-10-CM | POA: Diagnosis not present

## 2017-07-22 DIAGNOSIS — M545 Low back pain: Secondary | ICD-10-CM | POA: Diagnosis not present

## 2017-07-22 DIAGNOSIS — M542 Cervicalgia: Secondary | ICD-10-CM | POA: Diagnosis not present

## 2017-07-22 DIAGNOSIS — M25561 Pain in right knee: Secondary | ICD-10-CM | POA: Diagnosis not present

## 2017-07-28 ENCOUNTER — Ambulatory Visit: Payer: Managed Care, Other (non HMO) | Admitting: Neurology

## 2017-08-03 ENCOUNTER — Encounter (INDEPENDENT_AMBULATORY_CARE_PROVIDER_SITE_OTHER): Payer: Self-pay | Admitting: Orthopaedic Surgery

## 2017-08-03 ENCOUNTER — Ambulatory Visit (INDEPENDENT_AMBULATORY_CARE_PROVIDER_SITE_OTHER): Payer: 59 | Admitting: Orthopaedic Surgery

## 2017-08-03 DIAGNOSIS — S72001S Fracture of unspecified part of neck of right femur, sequela: Secondary | ICD-10-CM

## 2017-08-03 DIAGNOSIS — M25551 Pain in right hip: Secondary | ICD-10-CM | POA: Diagnosis not present

## 2017-08-03 MED FILL — AMOXICILLIN 500 MG CAPSULE: 500 | 2 days supply | Qty: 8 | Fill #0

## 2017-08-03 MED FILL — TRETINOIN GEL MICRO 0.1% PU: 0.1 | 30 days supply | Qty: 50 | Fill #2

## 2017-08-03 NOTE — Progress Notes (Signed)
The patient is now 6 months status post cannulated screw fixation of a nondisplaced femoral neck fracture of her right hip.  She is only 61 years old.  We did obtain a CT scan of her hip last month and did not see any abnormalities.  She still has severe pain but at this point is not walking with any type of assistive device.  She has been going through extensive therapy.  On exam I can put her hip through internal and external rotation but it is painful to her as well.  At this point I would like to repeat an x-ray of her right hip in early January.  I would like an AP and lateral of her right hip only.  We do not need to see the pelvis and AP and lateral with 2 views of the right hip.  Hopefully by later in January we can get her back to half days or 6-hour days of work but that will depend on our findings radiographically.

## 2017-08-04 DIAGNOSIS — M542 Cervicalgia: Secondary | ICD-10-CM | POA: Diagnosis not present

## 2017-08-04 DIAGNOSIS — Z23 Encounter for immunization: Secondary | ICD-10-CM | POA: Diagnosis not present

## 2017-08-04 DIAGNOSIS — M79604 Pain in right leg: Secondary | ICD-10-CM | POA: Diagnosis not present

## 2017-08-04 DIAGNOSIS — M545 Low back pain: Secondary | ICD-10-CM | POA: Diagnosis not present

## 2017-08-04 DIAGNOSIS — L658 Other specified nonscarring hair loss: Secondary | ICD-10-CM | POA: Diagnosis not present

## 2017-08-04 DIAGNOSIS — M25561 Pain in right knee: Secondary | ICD-10-CM | POA: Diagnosis not present

## 2017-08-04 DIAGNOSIS — L739 Follicular disorder, unspecified: Secondary | ICD-10-CM | POA: Diagnosis not present

## 2017-08-04 DIAGNOSIS — L661 Lichen planopilaris: Secondary | ICD-10-CM | POA: Diagnosis not present

## 2017-08-04 MED FILL — FINASTERIDE 5 MG TABLET: 5 | 30 days supply | Qty: 8 | Fill #0

## 2017-08-04 MED FILL — CLOBETASOL 0.05% SOLUTION: 0.05 | 30 days supply | Qty: 50 | Fill #0

## 2017-08-10 ENCOUNTER — Other Ambulatory Visit: Payer: Self-pay | Admitting: Neurology

## 2017-08-10 DIAGNOSIS — G4733 Obstructive sleep apnea (adult) (pediatric): Secondary | ICD-10-CM | POA: Diagnosis not present

## 2017-08-10 MED FILL — DESVENLAFAXINE SUC ER 50 MG: 50 | 30 days supply | Qty: 30 | Fill #5

## 2017-08-10 MED FILL — GABAPENTIN 300 MG CAPS: 300 | 30 days supply | Qty: 180 | Fill #0

## 2017-08-10 MED FILL — DOXYCYCLINE HYC 50 MG CAP: 50 | 30 days supply | Qty: 30 | Fill #1

## 2017-08-11 ENCOUNTER — Other Ambulatory Visit: Payer: Self-pay | Admitting: Neurology

## 2017-08-11 ENCOUNTER — Telehealth: Payer: Self-pay | Admitting: Neurology

## 2017-08-11 NOTE — Telephone Encounter (Signed)
Patient called in stating she would like to make a follow up with Dr. Brett Fairy for the month of December. I informed her that she is out of the office for over a week and then we have the holidays but she asked if I put a message back to the nurse she states maybe she can double book a slot.

## 2017-08-11 NOTE — Telephone Encounter (Signed)
I was able to offer the patient a apt in jan on the 9th at 8 am to get her in to discuss refilling medications.

## 2017-08-11 NOTE — Telephone Encounter (Signed)
I have informed the patient that Dr Brett Fairy was out of the office and wont be back until dec 17th. I don't have the capabilities of double booking a slot. Dr Brett Fairy may be willing to book her over lunch. The patient is needing this apt to get her medication refilled so that she may start work back in January. Dr

## 2017-08-23 ENCOUNTER — Encounter: Payer: Self-pay | Admitting: Neurology

## 2017-08-23 MED ORDER — DESVENLAFAXINE SUCCINATE ER 50 MG PO TB24: 50.0000 mg | ORAL_TABLET | Freq: Every day | ORAL | 5 refills | Status: DC

## 2017-08-23 MED FILL — VALACYCLOVIR HCL 500 MG TAB: 500 | 10 days supply | Qty: 10 | Fill #0

## 2017-08-23 MED FILL — MODAFINIL 100 MG TABLET: 100 | 30 days supply | Qty: 30 | Fill #0

## 2017-08-23 NOTE — Telephone Encounter (Signed)
I found Pristiq - not Effexor- she may have changed to this AD , but it was not changed and prescribed by [facility]> .

## 2017-08-30 MED FILL — CLOBETASOL 0.05% SOLUTION: 0.05 | 30 days supply | Qty: 50 | Fill #1

## 2017-09-03 MED FILL — DOXYCYCLINE HYC 50 MG CAP: 50 | 30 days supply | Qty: 30 | Fill #2

## 2017-09-03 MED FILL — DESVENLAFAXINE SUC ER 50 MG: 50 | 30 days supply | Qty: 30 | Fill #0

## 2017-09-08 ENCOUNTER — Ambulatory Visit (INDEPENDENT_AMBULATORY_CARE_PROVIDER_SITE_OTHER): Payer: 59

## 2017-09-08 ENCOUNTER — Other Ambulatory Visit (INDEPENDENT_AMBULATORY_CARE_PROVIDER_SITE_OTHER): Payer: Self-pay | Admitting: Orthopaedic Surgery

## 2017-09-08 ENCOUNTER — Ambulatory Visit (INDEPENDENT_AMBULATORY_CARE_PROVIDER_SITE_OTHER): Payer: 59 | Admitting: Orthopaedic Surgery

## 2017-09-08 ENCOUNTER — Encounter (INDEPENDENT_AMBULATORY_CARE_PROVIDER_SITE_OTHER): Payer: Self-pay | Admitting: Orthopaedic Surgery

## 2017-09-08 DIAGNOSIS — Z8781 Personal history of (healed) traumatic fracture: Secondary | ICD-10-CM

## 2017-09-08 DIAGNOSIS — Z9889 Other specified postprocedural states: Secondary | ICD-10-CM

## 2017-09-08 DIAGNOSIS — Z96641 Presence of right artificial hip joint: Secondary | ICD-10-CM | POA: Diagnosis not present

## 2017-09-08 NOTE — Progress Notes (Signed)
The patient is now about 7 months out from cannulated screw fixation of a nondisplaced right hip femoral neck fracture.  She still having significant pain in the right hip especially with internal and external rotation.  She is been ambulating now without assistive device and we have cleared to do so.  She is been through extensive physical therapy as well as rest, ice, heat, anti-inflammatories and time.  On exam the extremes of internal or external rotation cause her severe pain in her groin.  This is on the right side.  AP and lateral of the right hip show the hardware is intact and I do see some interval signs of healing I do not see any evidence of femoral head collapse or avascular necrosis but her clinical exam is quite worrisome.  Given the high incidence of osteonecrosis following femoral neck fractures an MRI is warranted at this point to rule out osteonecrosis or avascular necrosis as well as nonunion of the femoral neck fracture on the right side.  I do feel this is medically necessary as well given the amount of pain she is having 7 months postoperative.  She is already tried and failed all forms of conservative treatment including going back to offload that hip for a while and this is not helping.  We will order the MRI of her right hip and then see her back in 2 weeks for further evaluation and treatment.

## 2017-09-10 ENCOUNTER — Other Ambulatory Visit (INDEPENDENT_AMBULATORY_CARE_PROVIDER_SITE_OTHER): Payer: Self-pay

## 2017-09-10 DIAGNOSIS — M25551 Pain in right hip: Secondary | ICD-10-CM

## 2017-09-13 ENCOUNTER — Telehealth (INDEPENDENT_AMBULATORY_CARE_PROVIDER_SITE_OTHER): Payer: Self-pay | Admitting: Orthopaedic Surgery

## 2017-09-13 ENCOUNTER — Encounter: Payer: Self-pay | Admitting: Neurology

## 2017-09-13 NOTE — Telephone Encounter (Signed)
Patient called stating that [facility] imaging doesn't have her order to get the MRI and she was wanting to speak with you. CB # [phone]

## 2017-09-14 NOTE — Telephone Encounter (Signed)
Order was just place Friday and it has been sent to [place] imaging. Pt can try to call again to schedule. I tried calling pt back no answer, left message

## 2017-09-15 ENCOUNTER — Encounter: Payer: Self-pay | Admitting: Neurology

## 2017-09-15 ENCOUNTER — Ambulatory Visit (INDEPENDENT_AMBULATORY_CARE_PROVIDER_SITE_OTHER): Payer: 59 | Admitting: Neurology

## 2017-09-15 VITALS — BP 119/75 | HR 72 | Ht 66.0 in | Wt 156.0 lb

## 2017-09-15 DIAGNOSIS — G4720 Circadian rhythm sleep disorder, unspecified type: Secondary | ICD-10-CM

## 2017-09-15 DIAGNOSIS — Z9989 Dependence on other enabling machines and devices: Secondary | ICD-10-CM | POA: Diagnosis not present

## 2017-09-15 DIAGNOSIS — M2619 Other specified anomalies of jaw-cranial base relationship: Secondary | ICD-10-CM

## 2017-09-15 DIAGNOSIS — G4733 Obstructive sleep apnea (adult) (pediatric): Secondary | ICD-10-CM | POA: Diagnosis not present

## 2017-09-15 MED ORDER — DESVENLAFAXINE SUCCINATE ER 50 MG PO TB24: 50.0000 mg | ORAL_TABLET | Freq: Every day | ORAL | 3 refills | Status: DC

## 2017-09-15 MED ORDER — MODAFINIL 100 MG PO TABS: 100.0000 mg | ORAL_TABLET | Freq: Every day | ORAL | 3 refills | Status: DC

## 2017-09-15 MED ORDER — PYRIDOSTIGMINE BROMIDE 60 MG PO TABS: ORAL_TABLET | ORAL | 5 refills | Status: DC

## 2017-09-15 MED FILL — PYRIDOSTIGMINE BR 60 MG TAB: 60 | 30 days supply | Qty: 90 | Fill #0

## 2017-09-15 NOTE — Patient Instructions (Signed)
Lichen plano-pilaris, scalp and hair. should not be treated with Plaquenil in your case- optic nerve disorder. Marland Kitchen
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
## 2017-09-16 DIAGNOSIS — Z23 Encounter for immunization: Secondary | ICD-10-CM | POA: Diagnosis not present

## 2017-09-16 DIAGNOSIS — D485 Neoplasm of uncertain behavior of skin: Secondary | ICD-10-CM | POA: Diagnosis not present

## 2017-09-16 DIAGNOSIS — L668 Other cicatricial alopecia: Secondary | ICD-10-CM | POA: Diagnosis not present

## 2017-09-17 ENCOUNTER — Ambulatory Visit
Admission: RE | Admit: 2017-09-17 | Discharge: 2017-09-17 | Disposition: A | Discharge status: Home / Self Care | Payer: 59 | Source: Ambulatory Visit | Attending: Orthopaedic Surgery | Admitting: Orthopaedic Surgery

## 2017-09-17 DIAGNOSIS — M25551 Pain in right hip: Secondary | ICD-10-CM

## 2017-09-20 DIAGNOSIS — M180 Bilateral primary osteoarthritis of first carpometacarpal joints: Secondary | ICD-10-CM | POA: Diagnosis not present

## 2017-09-22 ENCOUNTER — Ambulatory Visit (INDEPENDENT_AMBULATORY_CARE_PROVIDER_SITE_OTHER): Payer: 59 | Admitting: Orthopaedic Surgery

## 2017-09-22 ENCOUNTER — Encounter (INDEPENDENT_AMBULATORY_CARE_PROVIDER_SITE_OTHER): Payer: Self-pay | Admitting: Orthopaedic Surgery

## 2017-09-22 DIAGNOSIS — Z9889 Other specified postprocedural states: Secondary | ICD-10-CM

## 2017-09-22 DIAGNOSIS — Z8781 Personal history of (healed) traumatic fracture: Secondary | ICD-10-CM

## 2017-09-22 NOTE — Progress Notes (Signed)
The patient is now 8 months status post cannulated screw fixation of a nondisplaced right hip femoral neck fracture.  She is been having right knee pain more than right hip pain and she is been through extensive physical therapy.  She is an MRI of her knee that was normal.  She returning after I obtain an MRI of her hip to make sure she is not developing avascular necrosis of the hip or any other issues.  On exam I can put her hip and knee through range of motion of the right side and both knees are painful but there is no obstructions to motion.  Her incisions well-healed on the right side.  The MRI of her right hip shows a femoral neck fracture is healed completely.  There is no edema in the femoral head or the acetabulum.  There is no cartilage irregularities or joint effusion.  There is no comp getting factors the hardware.  At this point this is good news for her femoral neck fracture is healed and there is no residual avascular necrosis.  She will follow-up  on as-needed basis for her hip at this standpoint.  If she develops any groin pain in the future I would like to obtain new x-rays if warranted and needed.  We will give her a note releasing her to work starting January 30.  All questions and concerns were answered and addressed.

## 2017-09-28 MED FILL — DOXYCYCLINE HYC 50 MG CAP: 50 | 90 days supply | Qty: 90 | Fill #3

## 2017-09-28 MED FILL — DESVENLAFAXINE SUC ER 50 MG: 50 | 90 days supply | Qty: 90 | Fill #0

## 2017-09-28 MED FILL — VALACYCLOVIR HCL 500 MG TAB: 500 | 10 days supply | Qty: 10 | Fill #0

## 2017-10-01 DIAGNOSIS — L730 Acne keloid: Secondary | ICD-10-CM | POA: Diagnosis not present

## 2017-10-01 DIAGNOSIS — H16223 Keratoconjunctivitis sicca, not specified as Sjogren's, bilateral: Secondary | ICD-10-CM | POA: Diagnosis not present

## 2017-10-01 DIAGNOSIS — L661 Lichen planopilaris: Secondary | ICD-10-CM | POA: Diagnosis not present

## 2017-10-04 DIAGNOSIS — D1801 Hemangioma of skin and subcutaneous tissue: Secondary | ICD-10-CM | POA: Diagnosis not present

## 2017-10-04 MED FILL — FINASTERIDE 5 MG TABLET: 5 | 30 days supply | Qty: 8 | Fill #0

## 2017-10-04 MED FILL — CLOBETASOL 0.05% SOLUTION: 0.05 | 30 days supply | Qty: 50 | Fill #0

## 2017-10-13 DIAGNOSIS — M316 Other giant cell arteritis: Secondary | ICD-10-CM | POA: Diagnosis not present

## 2017-11-01 MED FILL — FINASTERIDE 5 MG TABLET: 5 | 32 days supply | Qty: 8 | Fill #0

## 2017-11-01 MED FILL — CLOBETASOL 0.05% SOLUTION: 0.05 | 30 days supply | Qty: 50 | Fill #1

## 2017-11-16 ENCOUNTER — Ambulatory Visit (INDEPENDENT_AMBULATORY_CARE_PROVIDER_SITE_OTHER): Payer: 59 | Admitting: Orthopaedic Surgery

## 2017-11-16 ENCOUNTER — Ambulatory Visit (INDEPENDENT_AMBULATORY_CARE_PROVIDER_SITE_OTHER): Payer: 59

## 2017-11-16 ENCOUNTER — Encounter (INDEPENDENT_AMBULATORY_CARE_PROVIDER_SITE_OTHER): Payer: Self-pay | Admitting: Orthopaedic Surgery

## 2017-11-16 DIAGNOSIS — M25551 Pain in right hip: Secondary | ICD-10-CM

## 2017-11-16 DIAGNOSIS — Z9889 Other specified postprocedural states: Secondary | ICD-10-CM | POA: Diagnosis not present

## 2017-11-16 DIAGNOSIS — Z8781 Personal history of (healed) traumatic fracture: Secondary | ICD-10-CM | POA: Diagnosis not present

## 2017-11-16 MED ORDER — CELECOXIB 200 MG PO CAPS: 200.0000 mg | ORAL_CAPSULE | Freq: Two times a day (BID) | ORAL | 1 refills | Status: DC | PRN

## 2017-11-16 MED ORDER — METHYLPREDNISOLONE 4 MG PO TABS: ORAL_TABLET | ORAL | 0 refills | Status: DC

## 2017-11-16 MED ORDER — GABAPENTIN 300 MG PO CAPS: 300.0000 mg | ORAL_CAPSULE | Freq: Three times a day (TID) | ORAL | 1 refills | Status: DC

## 2017-11-16 MED FILL — METHYLPREDNISOLONE 4 MG TAB: 4 | 6 days supply | Qty: 21 | Fill #0

## 2017-11-16 MED FILL — CELECOXIB 200 MG CAPSULE: 200 | 30 days supply | Qty: 60 | Fill #0

## 2017-11-16 MED FILL — GABAPENTIN 300 MG CAPS: 300 | 30 days supply | Qty: 90 | Fill #0

## 2017-11-16 NOTE — Progress Notes (Signed)
The patient comes in today with debilitating right hip pain.  This affects her hip all the way down to her knee and groin.  She is 10 months status post percutaneous pinning of a nondisplaced right hip femoral neck fracture.  We have had an MRI of that hip in January of this year that showed the fracture to heal completely and there is no evidence of avascular necrosis.  With that MRI of her right knee that was entirely normal.  She is seeing a spine specialist Dr. Vertell Limber and she says her spine is stable.  She does have some degenerative changes in her lumbar spine but apparently nothing that accounts for the severe pain she is having down her leg.  She is now not able to work she states due to the severity of her pain and disability.  On exam it is hard to get a good exam out of her hip and knee because she has a lot of withdrawing and guarding due to pain.  An AP pelvis and lateral of the right hip obtained and he can see the cannulated screws on the right side but there is no evidence of femoral head collapse.  The fracture is healed completely.  The joint space appears normal.  Femoral head appears normal.  I am at a loss as to why she is having such debilitating pain with her right hip.  I am going to increase her Neurontin to 300 mg 3 times a day instead of twice a day.  We will try a 6-day steroid taper and have her start Celebrex twice daily.  I like to see her back in just 2 weeks to see how she is doing overall.

## 2017-11-18 DIAGNOSIS — M316 Other giant cell arteritis: Secondary | ICD-10-CM | POA: Diagnosis not present

## 2017-11-18 DIAGNOSIS — G7000 Myasthenia gravis without (acute) exacerbation: Secondary | ICD-10-CM | POA: Diagnosis not present

## 2017-11-18 DIAGNOSIS — G4733 Obstructive sleep apnea (adult) (pediatric): Secondary | ICD-10-CM | POA: Diagnosis not present

## 2017-11-18 DIAGNOSIS — H2513 Age-related nuclear cataract, bilateral: Secondary | ICD-10-CM | POA: Diagnosis not present

## 2017-11-18 DIAGNOSIS — H468 Other optic neuritis: Secondary | ICD-10-CM | POA: Diagnosis not present

## 2017-11-18 DIAGNOSIS — H16223 Keratoconjunctivitis sicca, not specified as Sjogren's, bilateral: Secondary | ICD-10-CM | POA: Diagnosis not present

## 2017-11-18 DIAGNOSIS — H40012 Open angle with borderline findings, low risk, left eye: Secondary | ICD-10-CM | POA: Diagnosis not present

## 2017-11-26 DIAGNOSIS — M25551 Pain in right hip: Secondary | ICD-10-CM | POA: Diagnosis not present

## 2017-11-26 DIAGNOSIS — M129 Arthropathy, unspecified: Secondary | ICD-10-CM | POA: Diagnosis not present

## 2017-11-29 MED FILL — FINASTERIDE 5 MG TABLET: 5 | 32 days supply | Qty: 8 | Fill #1

## 2017-11-30 DIAGNOSIS — L661 Lichen planopilaris: Secondary | ICD-10-CM | POA: Diagnosis not present

## 2017-12-01 ENCOUNTER — Ambulatory Visit (INDEPENDENT_AMBULATORY_CARE_PROVIDER_SITE_OTHER): Payer: 59 | Admitting: Orthopaedic Surgery

## 2017-12-01 ENCOUNTER — Encounter (INDEPENDENT_AMBULATORY_CARE_PROVIDER_SITE_OTHER): Payer: Self-pay | Admitting: Orthopaedic Surgery

## 2017-12-01 DIAGNOSIS — M25551 Pain in right hip: Secondary | ICD-10-CM | POA: Diagnosis not present

## 2017-12-01 NOTE — Progress Notes (Signed)
Patient is a 62 year old well-known to me.  She is 10 months out now from cannulated screw fixation of a nondisplaced right hip femoral neck fracture.  She is been dealing with debilitating pain is a release of his hip.  We have radiographic evidence on plain films and MRI of the fracture is healed completely.  She has had MRIs of her back in her knee as well.  All other structures are intact and clinically everything is intact.  However due to the severity of her pain is offset her muscles and she is having significant problems getting around in general.  She embolus with a cane and pain is severe and is daily.  We have tried and failed all forms of conservative treatment including psychotherapy, activity modification, anti-inflammatories and Neurontin.  She is at her wits end trying to figure out what she can do to get herself better.  On exam her pain is still significant severe with mobility of her hip.  From my standpoint the other thing I can offer is hip replacement surgery.  I cannot guarantee this would help with the severity of her pain and how this is affecting her mobility and her muscle interactions as she is trying to offset this pain as she ambulates.  She said that she will likely seek out a kinesiologist or someone that can help with her from especially standpoint and I agree with her trying anything to avoid surgery but the other thing that I can offer is a hip replacement.  From an anatomic standpoint the alignment of her hip and knee is anatomic on my exam and on radiographic findings.  However I do understand that given the severity of her pain that he can change her body mechanics and how she ambulates and this is getting worse for her.

## 2017-12-06 ENCOUNTER — Encounter: Payer: Self-pay | Admitting: Sports Medicine

## 2017-12-06 ENCOUNTER — Ambulatory Visit (INDEPENDENT_AMBULATORY_CARE_PROVIDER_SITE_OTHER): Payer: 59 | Admitting: Sports Medicine

## 2017-12-06 VITALS — BP 104/54 | Ht 66.0 in | Wt 149.0 lb

## 2017-12-06 DIAGNOSIS — M25551 Pain in right hip: Secondary | ICD-10-CM | POA: Diagnosis not present

## 2017-12-06 NOTE — Progress Notes (Signed)
   Subjective:    Patient ID: Felicia Cain, female    DOB: 07/16/1956, 62 y.o.   MRN: [id number]  HPI chief complaint: Right hip pain  Felicia Cain comes in today complaining of several months of right hip pain. She is status post ORIF done on 01/26/2017. Surgery was for a femoral neck fracture. She was treated with 3 cannulated lag screws by Dr. Ninfa Linden. She has had persistent pain since surgery. CT scans and MRI scans of her right hip have failed to identify an etiology for her pain. Dr. Ninfa Linden has offered her a total hip arthroplasty but she's not sure that that will be of much benefit. She localizes most of her pain to the right groin with radiating pain down the leg to the knee and at times into the lower leg. Again, her pain has been present since awakening from surgery. It's an achy discomfort this tends to be constant but is much worse with weightbearing. No swelling currently but she had significant swelling of the entire right leg after surgery. She's also had an evaluation by neurosurgery to rule out her lumbar spine as a source of pain. Dr. Vertell Limber did not believe her pain was secondary to her lumbar spine and neither does the patient. She has a history of sciatica but her current pain is different in nature than what she experienced at that time. She's tried gabapentin, Celebrex, and a prednisone Dosepak none of which were helpful. She is very frustrated by her disability. She has recently lost her job. She is here today for a second opinion.  Interim medical history reviewed Medications reviewed Allergies reviewed    Review of Systems As above    Objective:   Physical Exam  Well-developed, well-nourished. No acute distress.  Examination of the right hip shows reproducible pain with passive internal and external rotation. There does appear to be some decreased internal rotation on the right compared to the left. She does have weakness of the right lower leg, some of which is due to  pain. When standing, she tends to invert her right foot standing on the lateral most aspect of the foot because standing flat-footed tends to reproduce her groin pain.      Assessment & Plan:   Chronic right hip pain status post right hip ORIF  I think the patient needs a second surgical opinion.She is unsure as to why a total hip arthroplasty was recommended since she has very little arthritis in her hip. Her exam does suggest her pain to be originating from her right hip and I do not believe this to be sciatica from her lumbar spine. I recommend a referral to Dr. Drue Dun at [facility]. Patient is in agreement with that plan. Follow-up with me as needed.

## 2017-12-16 ENCOUNTER — Telehealth: Payer: Self-pay

## 2017-12-16 DIAGNOSIS — M25551 Pain in right hip: Secondary | ICD-10-CM

## 2017-12-16 NOTE — Telephone Encounter (Signed)
Referral faxed to [facility]. They will call the pt to schedule an appt. Pt understands.

## 2018-01-05 DIAGNOSIS — M542 Cervicalgia: Secondary | ICD-10-CM | POA: Diagnosis not present

## 2018-01-05 DIAGNOSIS — M25561 Pain in right knee: Secondary | ICD-10-CM | POA: Diagnosis not present

## 2018-01-05 DIAGNOSIS — M545 Low back pain: Secondary | ICD-10-CM | POA: Diagnosis not present

## 2018-01-05 DIAGNOSIS — M79604 Pain in right leg: Secondary | ICD-10-CM | POA: Diagnosis not present

## 2018-01-05 MED FILL — FINASTERIDE 5 MG TABLET: 5 | 28 days supply | Qty: 7 | Fill #0

## 2018-01-05 MED FILL — CLOBETASOL 0.05% SOLUTION: 0.05 | 20 days supply | Qty: 50 | Fill #0

## 2018-01-06 MED FILL — DOXYCYCLINE HYCLATE 50 MG C: 50 | 90 days supply | Qty: 90 | Fill #4

## 2018-01-06 MED FILL — DESVENLAFAXINE SUC ER 50 MG: 50 | 90 days supply | Qty: 90 | Fill #1

## 2018-01-11 DIAGNOSIS — M25551 Pain in right hip: Secondary | ICD-10-CM | POA: Diagnosis not present

## 2018-01-12 DIAGNOSIS — R269 Unspecified abnormalities of gait and mobility: Secondary | ICD-10-CM | POA: Diagnosis not present

## 2018-01-12 DIAGNOSIS — S76011D Strain of muscle, fascia and tendon of right hip, subsequent encounter: Secondary | ICD-10-CM | POA: Diagnosis not present

## 2018-01-12 DIAGNOSIS — M62561 Muscle wasting and atrophy, not elsewhere classified, right lower leg: Secondary | ICD-10-CM | POA: Diagnosis not present

## 2018-01-12 MED FILL — AMOXICILLIN 500 MG CAPSULE: 500 | 2 days supply | Qty: 8 | Fill #0

## 2018-01-12 MED FILL — LYRICA 75 MG CAPSULE: 75 | 30 days supply | Qty: 60 | Fill #0

## 2018-01-19 DIAGNOSIS — S76011D Strain of muscle, fascia and tendon of right hip, subsequent encounter: Secondary | ICD-10-CM | POA: Diagnosis not present

## 2018-01-19 DIAGNOSIS — M62561 Muscle wasting and atrophy, not elsewhere classified, right lower leg: Secondary | ICD-10-CM | POA: Diagnosis not present

## 2018-01-19 DIAGNOSIS — R269 Unspecified abnormalities of gait and mobility: Secondary | ICD-10-CM | POA: Diagnosis not present

## 2018-01-24 ENCOUNTER — Other Ambulatory Visit: Payer: Self-pay | Admitting: Orthopedic Surgery

## 2018-01-24 DIAGNOSIS — M25561 Pain in right knee: Secondary | ICD-10-CM

## 2018-01-24 DIAGNOSIS — M25551 Pain in right hip: Secondary | ICD-10-CM

## 2018-01-25 DIAGNOSIS — H1131 Conjunctival hemorrhage, right eye: Secondary | ICD-10-CM | POA: Diagnosis not present

## 2018-01-25 DIAGNOSIS — G7000 Myasthenia gravis without (acute) exacerbation: Secondary | ICD-10-CM | POA: Diagnosis not present

## 2018-01-25 DIAGNOSIS — H2513 Age-related nuclear cataract, bilateral: Secondary | ICD-10-CM | POA: Diagnosis not present

## 2018-01-25 DIAGNOSIS — H468 Other optic neuritis: Secondary | ICD-10-CM | POA: Diagnosis not present

## 2018-01-25 DIAGNOSIS — G4733 Obstructive sleep apnea (adult) (pediatric): Secondary | ICD-10-CM | POA: Diagnosis not present

## 2018-01-25 DIAGNOSIS — H16223 Keratoconjunctivitis sicca, not specified as Sjogren's, bilateral: Secondary | ICD-10-CM | POA: Diagnosis not present

## 2018-01-25 DIAGNOSIS — H40012 Open angle with borderline findings, low risk, left eye: Secondary | ICD-10-CM | POA: Diagnosis not present

## 2018-01-25 DIAGNOSIS — H04123 Dry eye syndrome of bilateral lacrimal glands: Secondary | ICD-10-CM | POA: Diagnosis not present

## 2018-01-26 DIAGNOSIS — M62561 Muscle wasting and atrophy, not elsewhere classified, right lower leg: Secondary | ICD-10-CM | POA: Diagnosis not present

## 2018-01-26 DIAGNOSIS — S76011D Strain of muscle, fascia and tendon of right hip, subsequent encounter: Secondary | ICD-10-CM | POA: Diagnosis not present

## 2018-01-26 DIAGNOSIS — R269 Unspecified abnormalities of gait and mobility: Secondary | ICD-10-CM | POA: Diagnosis not present

## 2018-01-28 DIAGNOSIS — M779 Enthesopathy, unspecified: Secondary | ICD-10-CM | POA: Diagnosis not present

## 2018-02-01 DIAGNOSIS — T300 Burn of unspecified body region, unspecified degree: Secondary | ICD-10-CM | POA: Diagnosis not present

## 2018-02-01 DIAGNOSIS — L661 Lichen planopilaris: Secondary | ICD-10-CM | POA: Diagnosis not present

## 2018-02-01 DIAGNOSIS — L710 Perioral dermatitis: Secondary | ICD-10-CM | POA: Diagnosis not present

## 2018-02-01 MED FILL — SILVADENE 1% CREAM: 1 | 7 days supply | Qty: 50 | Fill #0

## 2018-02-01 MED FILL — METHOTREXATE SODIUM 2.5 MG: 2.5 | 7 days supply | Qty: 3 | Fill #0

## 2018-02-01 MED FILL — FOLIC ACID 1 MG TABS: 1 | 30 days supply | Qty: 30 | Fill #0

## 2018-02-01 MED FILL — FINASTERIDE 5 MG TABLET: 5 | 28 days supply | Qty: 7 | Fill #0

## 2018-02-02 ENCOUNTER — Other Ambulatory Visit: Payer: Self-pay | Admitting: Orthopedic Surgery

## 2018-02-02 DIAGNOSIS — M25561 Pain in right knee: Secondary | ICD-10-CM

## 2018-02-02 DIAGNOSIS — S76011D Strain of muscle, fascia and tendon of right hip, subsequent encounter: Secondary | ICD-10-CM | POA: Diagnosis not present

## 2018-02-02 DIAGNOSIS — M62561 Muscle wasting and atrophy, not elsewhere classified, right lower leg: Secondary | ICD-10-CM | POA: Diagnosis not present

## 2018-02-02 DIAGNOSIS — R269 Unspecified abnormalities of gait and mobility: Secondary | ICD-10-CM | POA: Diagnosis not present

## 2018-02-02 DIAGNOSIS — M25551 Pain in right hip: Secondary | ICD-10-CM

## 2018-02-03 ENCOUNTER — Ambulatory Visit
Admission: RE | Admit: 2018-02-03 | Discharge: 2018-02-03 | Disposition: A | Discharge status: Home / Self Care | Payer: 59 | Source: Ambulatory Visit | Attending: Orthopedic Surgery | Admitting: Orthopedic Surgery

## 2018-02-03 DIAGNOSIS — M25551 Pain in right hip: Secondary | ICD-10-CM

## 2018-02-03 DIAGNOSIS — M25552 Pain in left hip: Secondary | ICD-10-CM | POA: Diagnosis not present

## 2018-02-03 DIAGNOSIS — M25561 Pain in right knee: Secondary | ICD-10-CM | POA: Diagnosis not present

## 2018-02-03 DIAGNOSIS — M25562 Pain in left knee: Secondary | ICD-10-CM | POA: Diagnosis not present

## 2018-02-07 DIAGNOSIS — Z79899 Other long term (current) drug therapy: Secondary | ICD-10-CM | POA: Diagnosis not present

## 2018-02-07 DIAGNOSIS — M62561 Muscle wasting and atrophy, not elsewhere classified, right lower leg: Secondary | ICD-10-CM | POA: Diagnosis not present

## 2018-02-07 DIAGNOSIS — R269 Unspecified abnormalities of gait and mobility: Secondary | ICD-10-CM | POA: Diagnosis not present

## 2018-02-07 DIAGNOSIS — S76011D Strain of muscle, fascia and tendon of right hip, subsequent encounter: Secondary | ICD-10-CM | POA: Diagnosis not present

## 2018-02-07 DIAGNOSIS — L661 Lichen planopilaris: Secondary | ICD-10-CM | POA: Diagnosis not present

## 2018-02-08 MED FILL — METHOTREXATE SODIUM 2.5 MG: 2.5 | 14 days supply | Qty: 6 | Fill #0

## 2018-02-15 DIAGNOSIS — Q6589 Other specified congenital deformities of hip: Secondary | ICD-10-CM | POA: Diagnosis not present

## 2018-02-15 MED FILL — traMADol HCL 50 MG TABS: 50 | 13 days supply | Qty: 50 | Fill #0

## 2018-02-16 DIAGNOSIS — Z1151 Encounter for screening for human papillomavirus (HPV): Secondary | ICD-10-CM | POA: Diagnosis not present

## 2018-02-16 DIAGNOSIS — Z6825 Body mass index (BMI) 25.0-25.9, adult: Secondary | ICD-10-CM | POA: Diagnosis not present

## 2018-02-16 DIAGNOSIS — Z01419 Encounter for gynecological examination (general) (routine) without abnormal findings: Secondary | ICD-10-CM | POA: Diagnosis not present

## 2018-02-21 DIAGNOSIS — Z13220 Encounter for screening for lipoid disorders: Secondary | ICD-10-CM | POA: Diagnosis not present

## 2018-02-21 DIAGNOSIS — Z0000 Encounter for general adult medical examination without abnormal findings: Secondary | ICD-10-CM | POA: Diagnosis not present

## 2018-02-21 DIAGNOSIS — L661 Lichen planopilaris: Secondary | ICD-10-CM | POA: Diagnosis not present

## 2018-02-21 DIAGNOSIS — Z1329 Encounter for screening for other suspected endocrine disorder: Secondary | ICD-10-CM | POA: Diagnosis not present

## 2018-02-23 MED FILL — METHOTREXATE SODIUM 2.5 MG: 2.5 | 7 days supply | Qty: 5 | Fill #0

## 2018-02-25 MED FILL — FOLIC ACID 1 MG TABS: 1 | 30 days supply | Qty: 30 | Fill #1

## 2018-02-28 DIAGNOSIS — Z79899 Other long term (current) drug therapy: Secondary | ICD-10-CM | POA: Diagnosis not present

## 2018-02-28 DIAGNOSIS — L661 Lichen planopilaris: Secondary | ICD-10-CM | POA: Diagnosis not present

## 2018-03-01 MED FILL — METHOTREXATE SODIUM 2.5 MG: 2.5 | 30 days supply | Qty: 25 | Fill #0

## 2018-03-08 MED FILL — FINASTERIDE 5 MG TABLET: 5 | 28 days supply | Qty: 7 | Fill #1

## 2018-03-09 MED FILL — FOLIC ACID 1 MG TABS: 1 | 30 days supply | Qty: 60 | Fill #0

## 2018-03-14 DIAGNOSIS — M779 Enthesopathy, unspecified: Secondary | ICD-10-CM | POA: Diagnosis not present

## 2018-03-15 DIAGNOSIS — Z5181 Encounter for therapeutic drug level monitoring: Secondary | ICD-10-CM | POA: Diagnosis not present

## 2018-03-15 DIAGNOSIS — L661 Lichen planopilaris: Secondary | ICD-10-CM | POA: Diagnosis not present

## 2018-03-15 DIAGNOSIS — L603 Nail dystrophy: Secondary | ICD-10-CM | POA: Diagnosis not present

## 2018-04-01 IMAGING — RF DG C-ARM 61-120 MIN
1 series · 5 of 5 positions shown · non-contrast
Comparison: None.

CLINICAL DATA: Status post right hip pinning.  Initial encounter.

EXAM:
OPERATIVE RIGHT HIP (WITH PELVIS IF PERFORMED) 2 VIEWS
TECHNIQUE: Fluoroscopic spot image(s) were submitted for interpretation
post-operatively.

[Series 1: run · 5 of 5 slices shown]
[im 1/5]
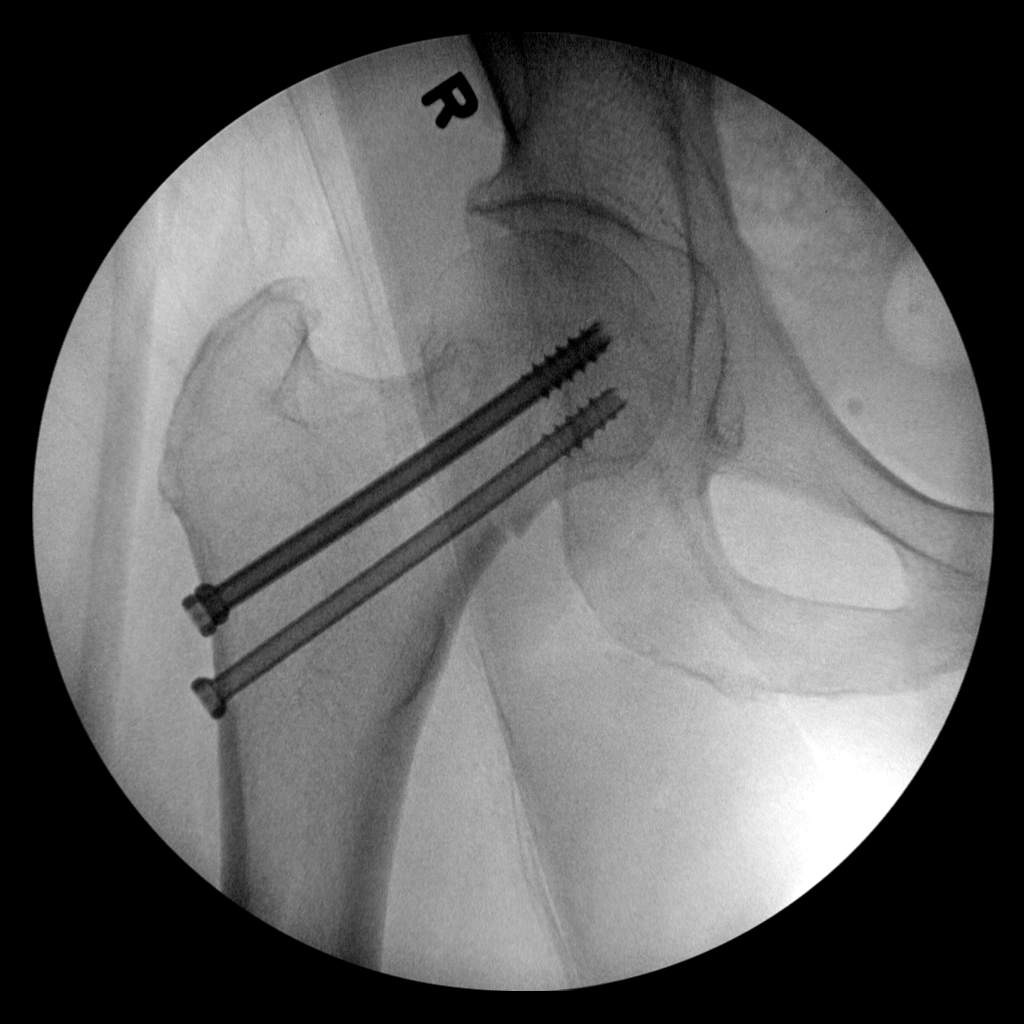
[im 2/5]
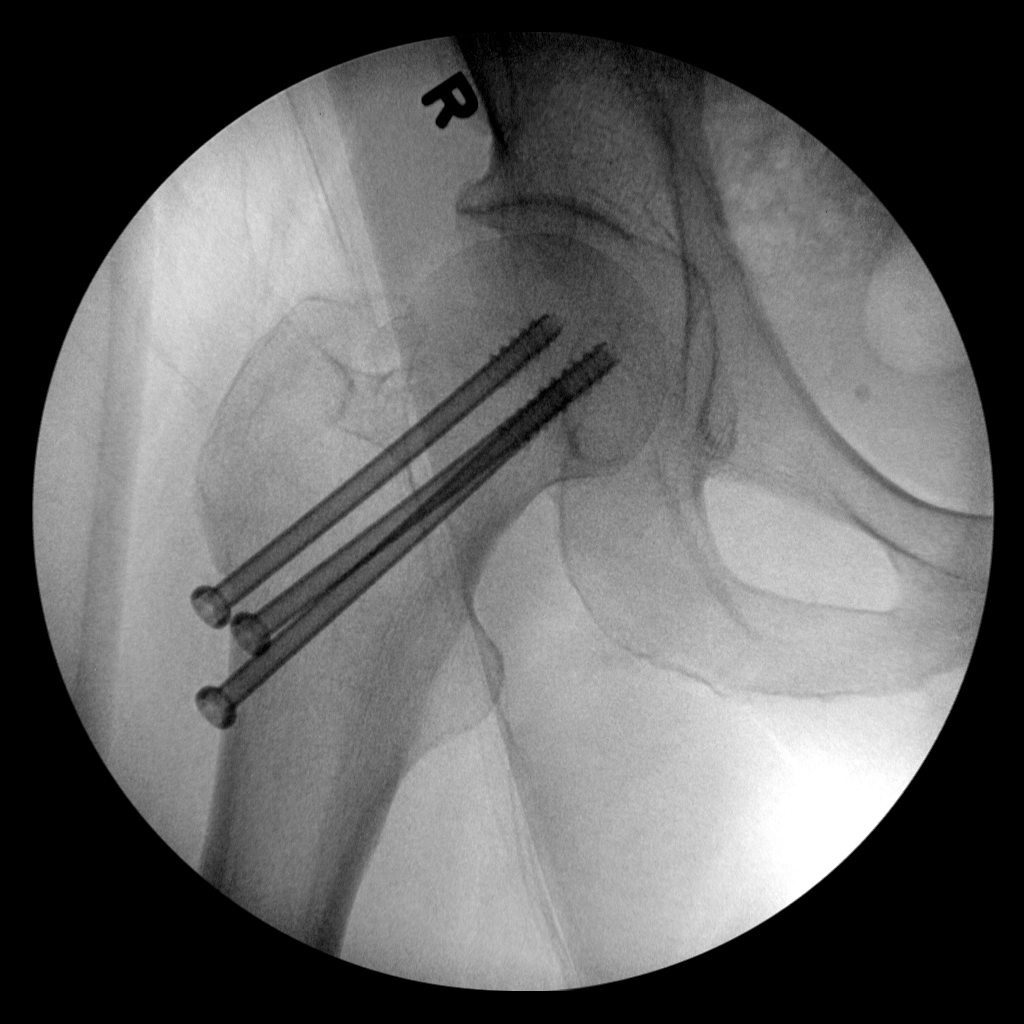
[im 3/5]
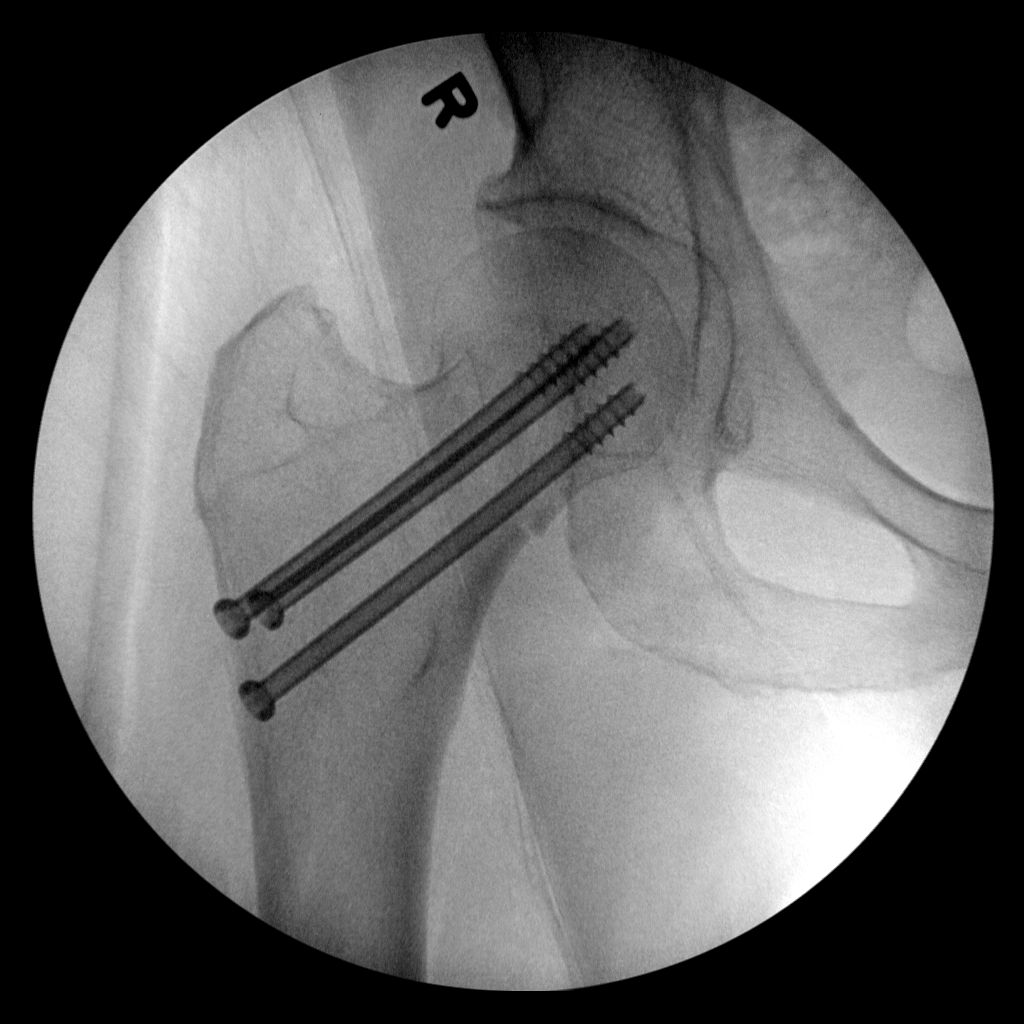
[im 4/5]
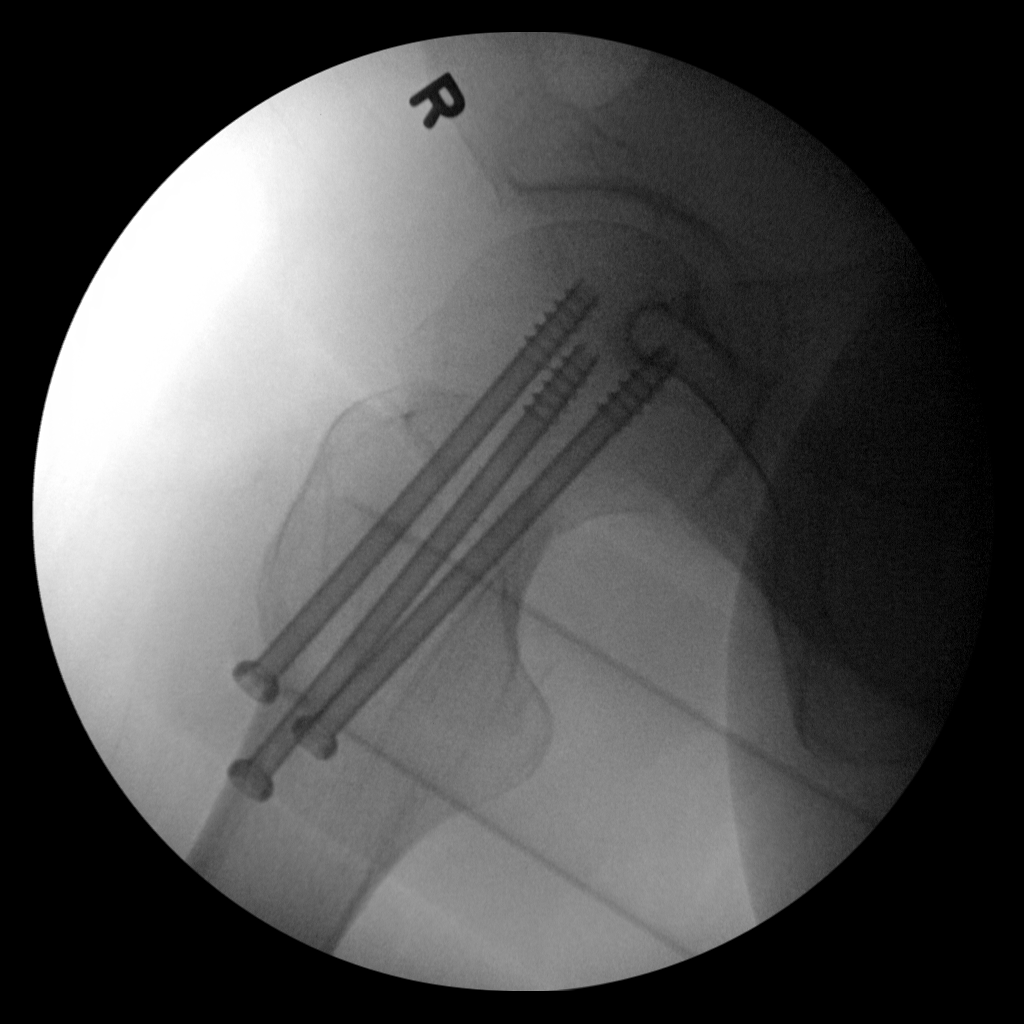
[im 5/5]
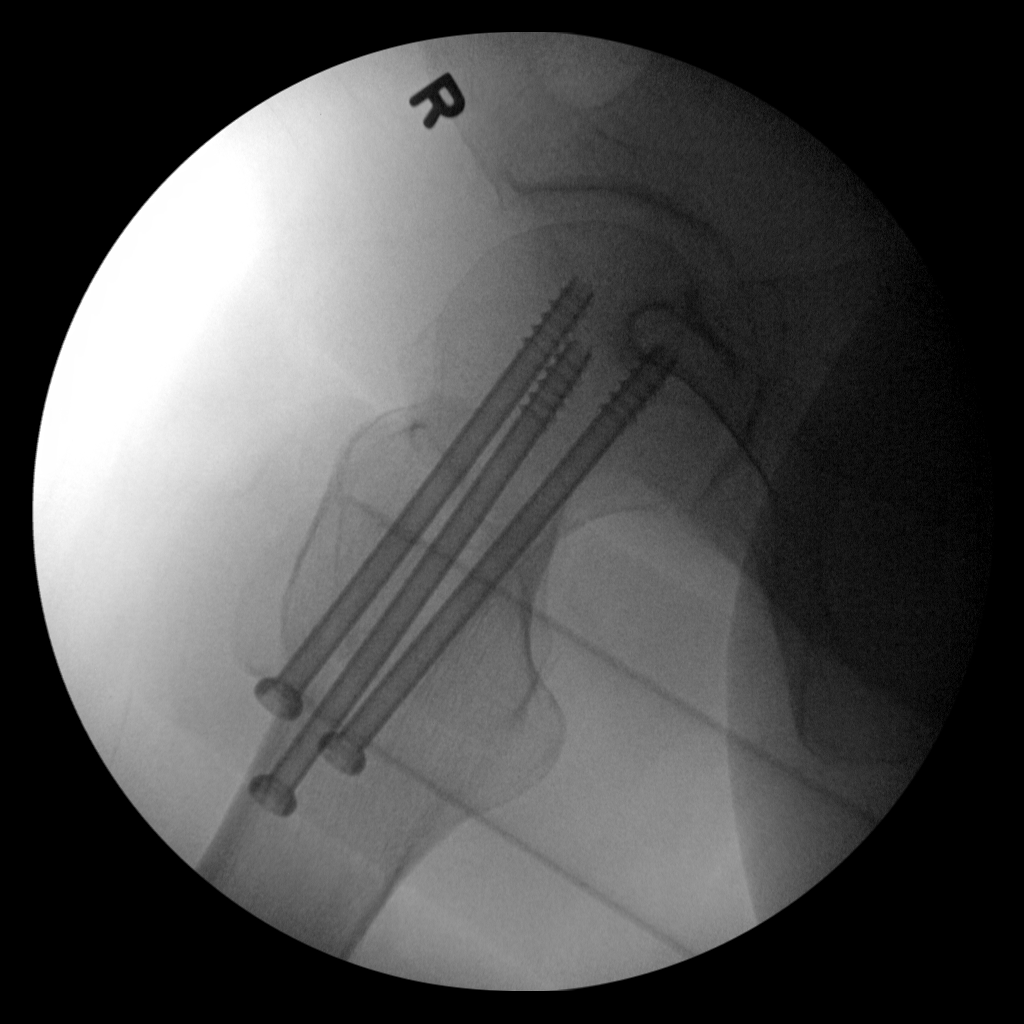

[5 of 5 positions shown; findings below may reference images not displayed]

FINDINGS: Five fluoroscopic C-arm images are provided from the OR,
demonstrating placement of 3 pins across the patient's right femoral
neck fracture, transfixing the fracture in grossly anatomic
alignment. The right femoral head remains seated at the acetabulum.
No new fractures are seen.
IMPRESSION: Status post internal fixation of right femoral fracture in grossly
anatomic alignment.

## 2018-04-04 MED FILL — METHOTREXATE SODIUM 2.5 MG: 2.5 | 42 days supply | Qty: 30 | Fill #0

## 2018-04-04 MED FILL — GABAPENTIN 300 MG CAPSULE: 300 | 30 days supply | Qty: 90 | Fill #1

## 2018-04-04 MED FILL — CLOBETASOL 0.05% SOLUTION: 0.05 | 20 days supply | Qty: 50 | Fill #1

## 2018-04-04 MED FILL — FINASTERIDE 5 MG TABLET: 5 | 84 days supply | Qty: 21 | Fill #2

## 2018-04-04 MED FILL — DESVENLAFAXINE SUC ER 50 MG: 50 | 90 days supply | Qty: 90 | Fill #2

## 2018-04-04 MED FILL — DOXYCYCLINE HYCLATE 50 MG C: 50 | 90 days supply | Qty: 90 | Fill #5

## 2018-04-04 MED FILL — FOLIC ACID 1 MG TABS: 1 | 90 days supply | Qty: 180 | Fill #1

## 2018-04-11 IMAGING — CR DG HIP (WITH OR WITHOUT PELVIS) 2-3V*R*
3 series · 3 of 3 positions shown · non-contrast
Comparison: 01/28/2017

CLINICAL DATA: Pain and swelling of right hip and status post
recent right femoral neck fracture with placement of cannulated
screws on 01/26/2017.

EXAM:
DG HIP (WITH OR WITHOUT PELVIS) 2-3V RIGHT

[t pelvis ap]
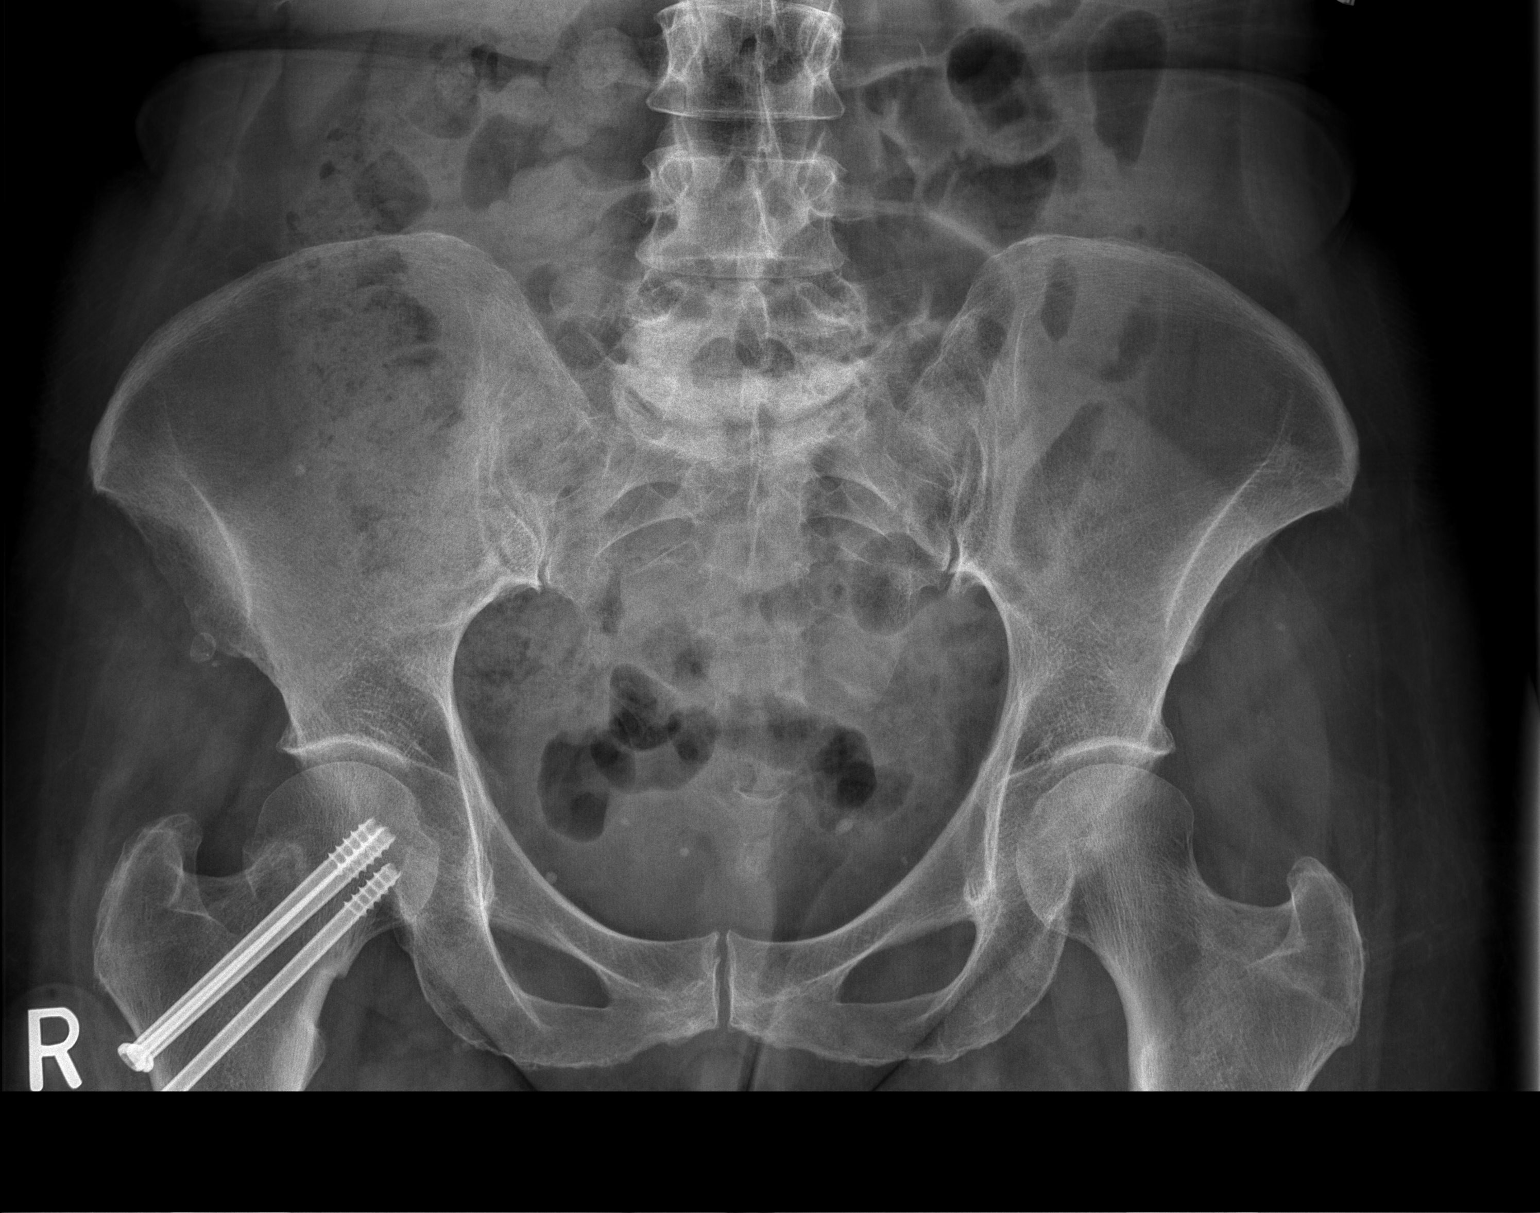

[t hip ap right]
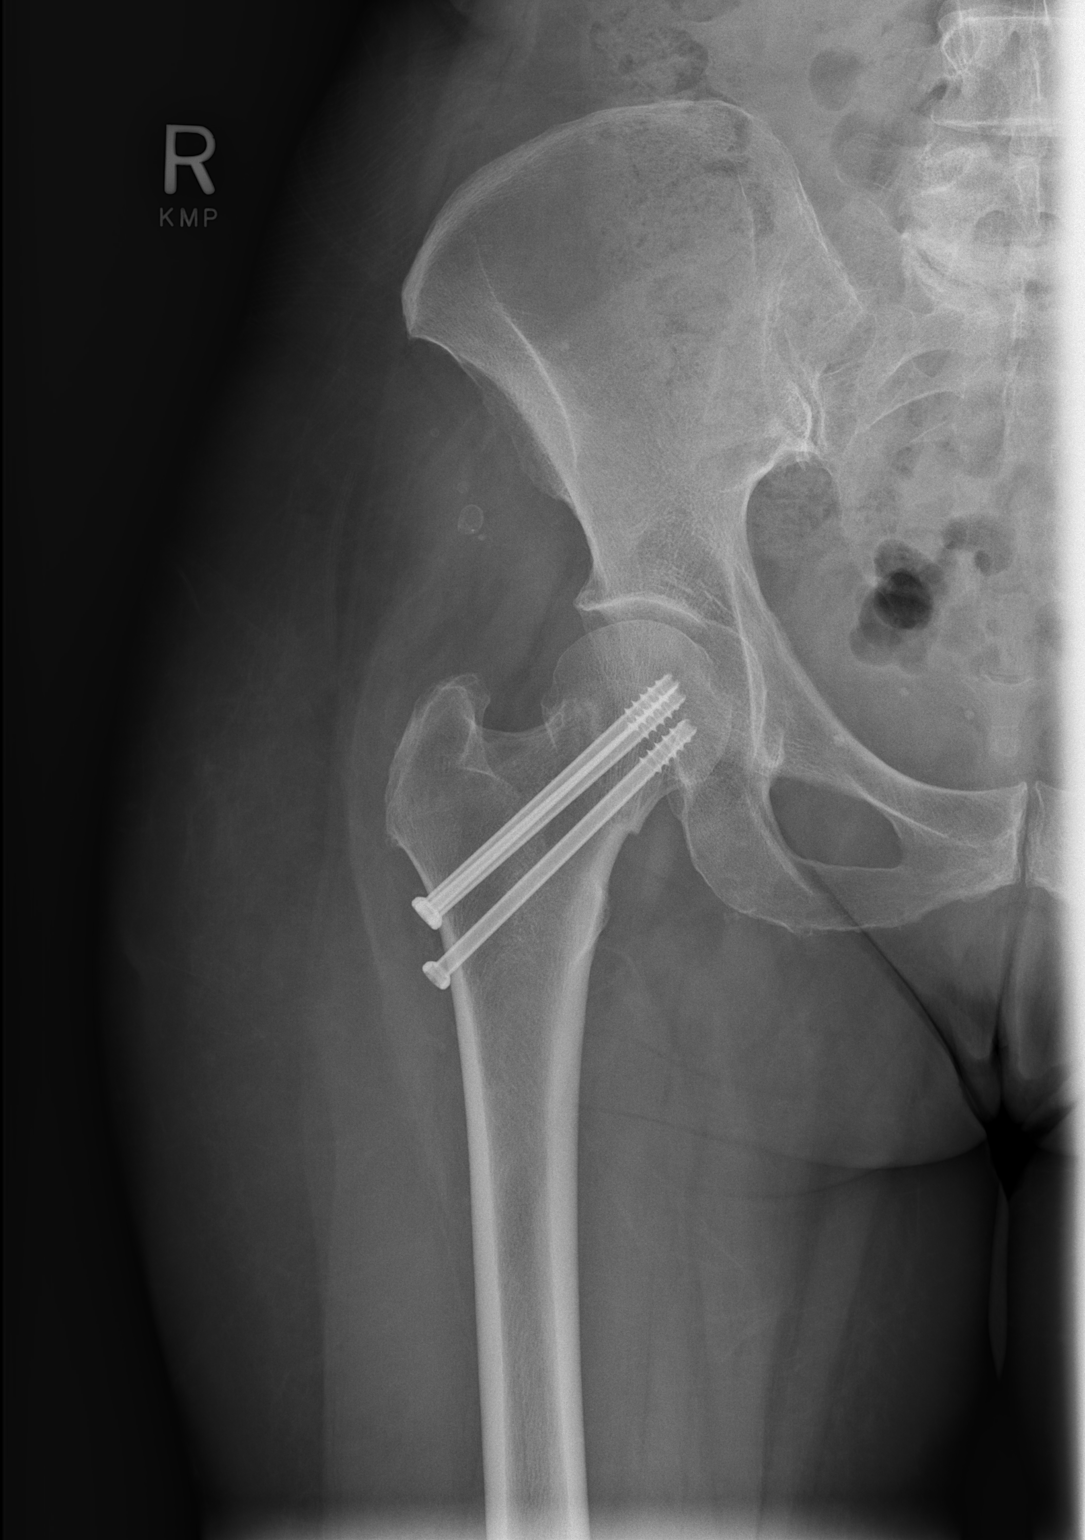

[t hip frog leg right]
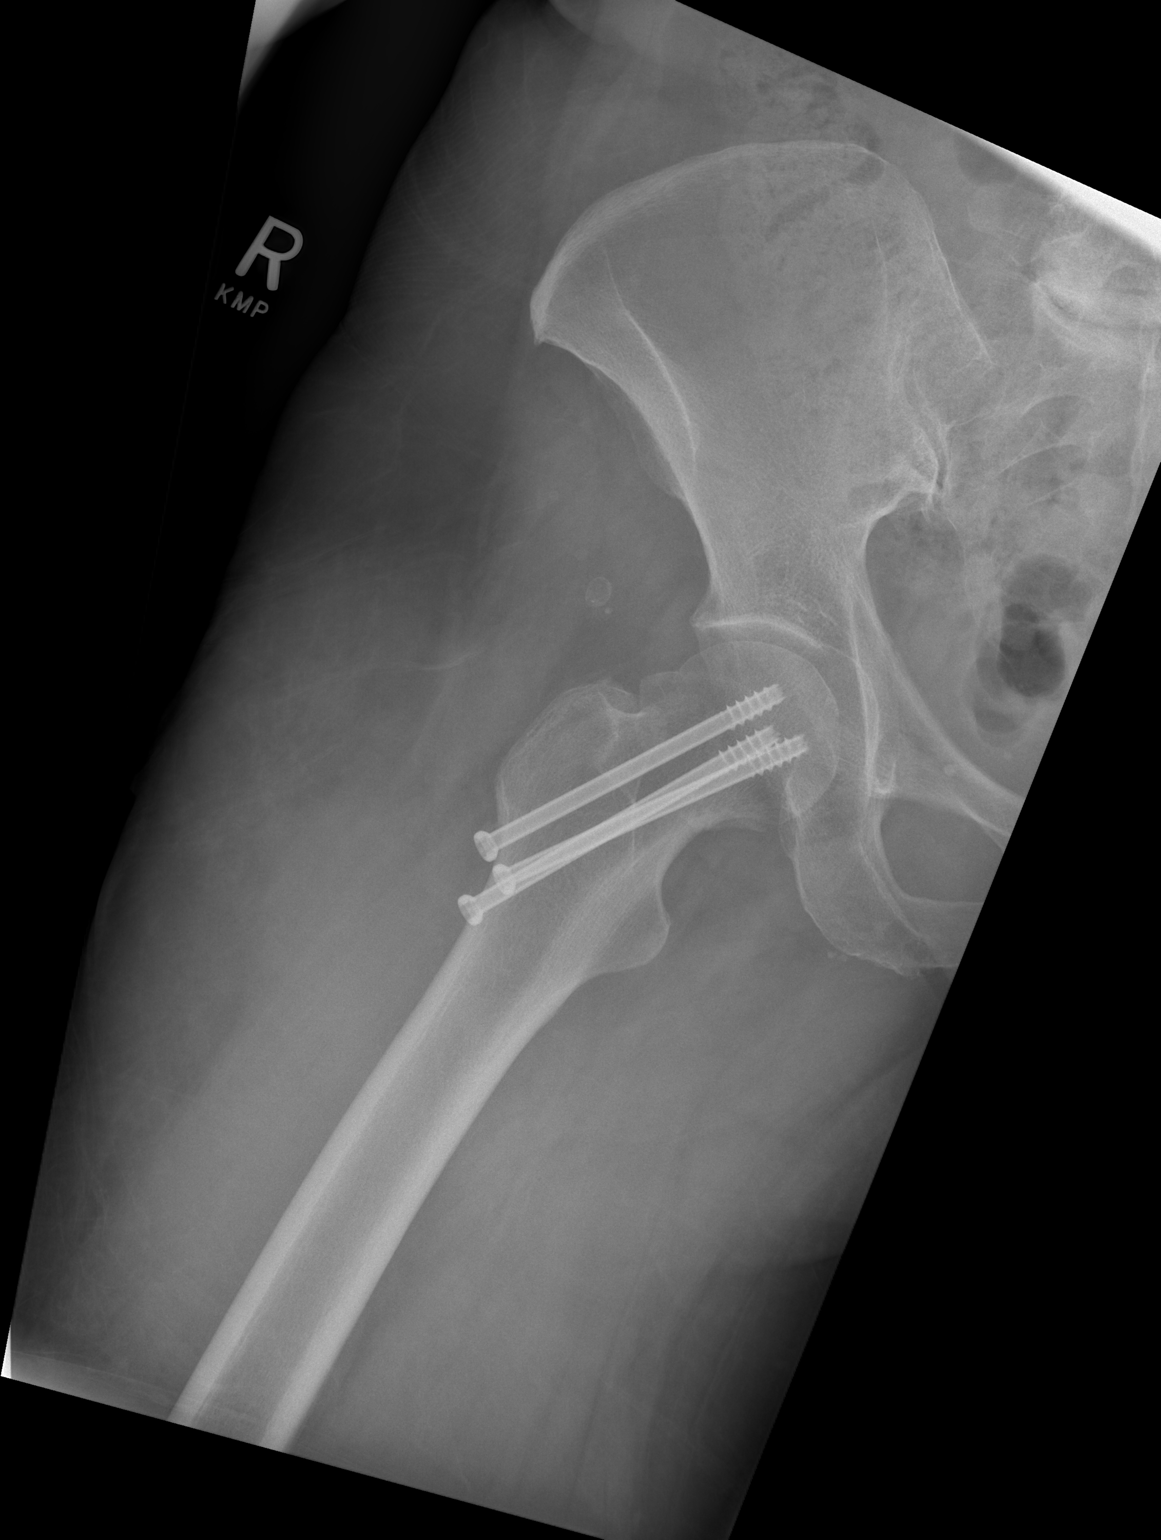

[3 of 3 positions shown; findings below may reference images not displayed]

FINDINGS: Alignment of the proximal right femur is stable and anatomic with no
evidence of displacement at the level of previous fracture. The
cannulated screws show stable positioning without retraction. No
abnormal lucency is seen surrounding the screws. No significant
impaction identified. The hip joint shows normal alignment. No bony
destruction or significant arthropathy.
IMPRESSION: Gross anatomic alignment at the level of recently treated femoral
neck fracture. Cannulated screw shows stable positioning.

## 2018-04-27 DIAGNOSIS — L219 Seborrheic dermatitis, unspecified: Secondary | ICD-10-CM | POA: Diagnosis not present

## 2018-04-27 DIAGNOSIS — L661 Lichen planopilaris: Secondary | ICD-10-CM | POA: Diagnosis not present

## 2018-04-27 DIAGNOSIS — Z79899 Other long term (current) drug therapy: Secondary | ICD-10-CM | POA: Diagnosis not present

## 2018-05-03 ENCOUNTER — Ambulatory Visit (INDEPENDENT_AMBULATORY_CARE_PROVIDER_SITE_OTHER): Payer: 59 | Admitting: Sports Medicine

## 2018-05-03 VITALS — BP 110/74 | Ht 66.0 in | Wt 150.0 lb

## 2018-05-03 DIAGNOSIS — M25512 Pain in left shoulder: Secondary | ICD-10-CM | POA: Diagnosis not present

## 2018-05-03 NOTE — Progress Notes (Signed)
Felicia Cain - 62 y.o. female MRN [id number]  Date of birth: 05-17-1956  SUBJECTIVE:  Including CC & ROS.  CC: bilateral shoulder pain  Felicia Cain is a 62 yo female presenting with bilateral acute shoulder pain after a fall on Saturday. She reports that she was unloading her laundry and quickly stood up, started to fall, and held onto the handle to keep from falling. She felt like she pulled her left shoulder and immediately felt sharp pain throughout the shoulder joint. Since Saturday, she has kept her left shoulder in a sling and has been doing ice and advil. It has improved slightly (she was unable to move the arm at all for the first couple of days). She felt her shoulder "pop" and was unsure if it has subluxed. At this time, she has limited mobility in shoulder without pain. Her right shoulder has also started to bother her due to overuse. She has also noted that her breast implant has "dropped" and is unsure if the also injured her pectoralis muscle as she has significant pain over that region as well.    ROS: No unexpected weight loss, fever, chills, swelling, instability, muscle pain, numbness/tingling, redness, otherwise see HPI   PMHx - Updated and reviewed.  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  FHx - Updated and reviewed.  Contributory factors include:  Negative Social Hx - Updated and reviewed. Contributory factors include: Negative Medications - reviewed   PHYSICAL EXAM:  VS: BP:110/74   HT:5\' 6"  (167.6 cm)   WT:150 lb (68 kg)  BMI:24.22 PHYSICAL EXAM: Gen: NAD, alert, cooperative with exam, well-appearing HEENT: clear conjunctiva,  CV:  no edema, capillary refill brisk, normal rate Resp: non-labored Skin: no rashes, normal turgor  Neuro: no gross deficits.  Psych:  alert and oriented   Left shoulder: Inspection reveals mild swelling at lateral upper arm Palpation is normal with tenderness over pectoralis muscle and AC  joint. passive ROM > active ROM (limited by pain) Rotator cuff strength normal throughout, but pain with resisted external rotation, resisted supraspinatus. No pain with resisted internal rotation No signs of impingement with negative Neer and Hawkin's tests, empty can sign. Speeds tests normal. No labral pathology noted with negative Obrien's, negative clunk and good stability. Normal scapular function observed. Pain with shoulder apprehension  Right Shoulder: Inspection reveals no abnormalities. Palpation is normal with no over AC joint. Tenderness over right bicipital groove. ROM is full in all planes. Rotator cuff strength normal throughout. No signs of impingement with negative Neer and Hawkin's tests, empty can sign. Speeds and Yergason's tests normal. No labral pathology noted with negative Obrien's, negative clunk and good stability. Normal scapular function observed. No painful arc and no drop arm sign. No apprehension sign   ULTRASOUND:  Shoulder, left Diagnostic ultrasound imaging obtained of patient's left shoulder. .  - Long head of the biceps tendon: No evidence of tendon thickening, calcification, subluxation, or tearing in short or long axis views. No edema or bullseye sign.  - Subscapularis tendon: complete visualization across the width of the insertion point yielded no evidence of tendon thickening, calcification, or tears in the long axis view.  - Supraspinatus tendon: complete visualization across the width of the insertion point showed some hypoechoic changes but no no evidence of tears or rupture in the long axis view.  - Infraspinatus and teres minor tendons: visualization across the width of the insertion points yielded hypoechoic changes but no evidence  of rupture in the the long axis view.  - Pectoralis major tendon insertion with mild hypoechoic changes but no tears or ruptures noted.  Shoulder, right - Long head of the biceps tendon: Mild hypoechoic  changes in short axis view but no evidence of tendon thickening, subluxation, or tearing in long axis views.     ASSESSMENT & PLAN:  Left shoulder rotator cuff strain- infraspinatus and supraspinatus with inflammation on ultrasound but no rupture. Will allow time for irritation to settle and will re-evaluate in a couple of weeks to ensure that pain and function are improving. - continue with ice and advil - do daily pendulum exercises to prevent frozen shoulder -  remove shoulder from sling as able - return in 2 weeks. If no improvement, will obtain MRI to evaluate for rotator cuff or labrum pathology  - follow up with cosmetic surgery about breast implant  Sherilyn Banker, MD [facility], PGY-3  Patient seen and evaluated with the resident. I agree with the above plan of care. Bedside ultrasound today does not show an obvious rotator cuff rupture. She does have degenerative changes in the supraspinatus and infraspinatus. Treatment as above. Follow-up in 2 weeks for reevaluation. If symptoms persist consider merits of further diagnostic imaging.Patient will follow-up with her plastic surgeon regarding possible injury to the breast implant on the left side.

## 2018-05-04 ENCOUNTER — Encounter: Payer: Self-pay | Admitting: Sports Medicine

## 2018-05-06 DIAGNOSIS — Z760 Encounter for issue of repeat prescription: Secondary | ICD-10-CM | POA: Diagnosis not present

## 2018-05-10 ENCOUNTER — Telehealth: Payer: Self-pay | Admitting: Neurology

## 2018-05-10 DIAGNOSIS — G4733 Obstructive sleep apnea (adult) (pediatric): Secondary | ICD-10-CM

## 2018-05-10 DIAGNOSIS — Z9989 Dependence on other enabling machines and devices: Secondary | ICD-10-CM

## 2018-05-10 NOTE — Telephone Encounter (Signed)
I have wrote the script just as Dr Dohmeier orders and it has the parameters of the machine listed on there.

## 2018-05-10 NOTE — Telephone Encounter (Signed)
Pt request script for new cpap machine. She is wanting to pick the script up. Please call to advise

## 2018-05-10 NOTE — Telephone Encounter (Signed)
Pt called back she need cpap titration parameters on the script so the new DME company can set it up correctly. Please call to discuss

## 2018-05-10 NOTE — Telephone Encounter (Signed)
Called the pt to make her aware that we can certainly fill a script for the patient for a new machine but appears that the current machine is less then 5 yrs. Patient would like to still get a new one and states that if the new insurance doesn't cover then she will pay out of pocket. I will place 2 copies of the order in a envelope at the front desk for the patient to pick up. Pt verbalized understanding.

## 2018-05-13 ENCOUNTER — Ambulatory Visit (INDEPENDENT_AMBULATORY_CARE_PROVIDER_SITE_OTHER): Payer: 59 | Admitting: Sports Medicine

## 2018-05-13 VITALS — BP 122/72 | Ht 66.0 in | Wt 150.0 lb

## 2018-05-13 DIAGNOSIS — M25512 Pain in left shoulder: Secondary | ICD-10-CM

## 2018-05-13 DIAGNOSIS — M25511 Pain in right shoulder: Secondary | ICD-10-CM

## 2018-05-13 MED ORDER — METHYLPREDNISOLONE ACETATE 40 MG/ML IJ SUSP
40.0000 mg | Freq: Once | INTRAMUSCULAR | Status: AC
  Administered 2018-05-13: 40 mg via INTRA_ARTICULAR

## 2018-05-13 MED FILL — METHOTREXATE SODIUM 2.5 MG: 2.5 | 28 days supply | Qty: 20 | Fill #0

## 2018-05-13 NOTE — Progress Notes (Signed)
  Patient follows up today for bilateral shoulder pain. Pain persists. Please see the office note from 05/03/2018 for details regarding history and physical exam findings of both shoulders. I recommend that we try bilateral subacromial cortisone injections today. If pain persists, especially in regards to the left shoulder, we will need to consider an MRI to rule out rotator cuff tear not appreciated on previous ultrasound. Patient agrees with this plan.  Consent obtained and verified. Time-out conducted. Noted no overlying erythema, induration, or other signs of local infection. Skin prepped in a sterile fashion. Topical analgesic spray: Ethyl chloride. Joint: left shoulder (subacromial) Needle: 25g 1.5 inch Completed without difficulty. Meds: 3cc 1% xylocaine, 1cc (40mg ) depomedrol  Advised to call if fevers/chills, erythema, induration, drainage, or persistent bleeding.  Consent obtained and verified. Time-out conducted. Noted no overlying erythema, induration, or other signs of local infection. Skin prepped in a sterile fashion. Topical analgesic spray: Ethyl chloride. Joint: right shoulder (subacromial) Needle: 25g 1.5 inch Completed without difficulty. Meds: 3cc 1% xylocaine, 1cc (40mg ) depomedrol  Advised to call if fevers/chills, erythema, induration, drainage, or persistent bleeding.

## 2018-05-16 ENCOUNTER — Ambulatory Visit (INDEPENDENT_AMBULATORY_CARE_PROVIDER_SITE_OTHER): Payer: 59 | Admitting: Orthopaedic Surgery

## 2018-05-16 ENCOUNTER — Encounter (INDEPENDENT_AMBULATORY_CARE_PROVIDER_SITE_OTHER): Payer: Self-pay | Admitting: Orthopaedic Surgery

## 2018-05-16 DIAGNOSIS — Z8781 Personal history of (healed) traumatic fracture: Secondary | ICD-10-CM

## 2018-05-16 DIAGNOSIS — Z9889 Other specified postprocedural states: Secondary | ICD-10-CM | POA: Diagnosis not present

## 2018-05-16 DIAGNOSIS — M25551 Pain in right hip: Secondary | ICD-10-CM | POA: Diagnosis not present

## 2018-05-16 NOTE — Progress Notes (Signed)
The patient is well-known to me.  She has a complicated history as it relates to her right hip.  She is only 62 years old and sustained a mechanical fall about 16 months ago injuring her right hip.  She was found to have a completely nondisplaced femoral neck fracture.  This was verified with films that accompany her [place] but then we obtained films in the hospital did continue to show a nondisplaced femoral neck fracture.  Given her young age we recommended cannulated screw fixation and were able to pin her hip in situ.  She since healed the fracture completely but has had debilitating pain as it relates to her whole right lower extremity from her hip down to her knee and even past that.  She has been through significant therapy involving her back and her hip in her knee.  An MRI of her right knee was normal in terms of no internal derangements of the knee in spite of her severe pain.  She has had to take oxycodone on occasion due to her pain is had problems with ambulating.  She felt that there was a version problem with her hip and did see a specialist at [facility] who felt that her right hip was significantly anteverted compared to the left hip.  They confirmed this on CT scan as well.  They have recommended a potential osteotomy to be rotated the femur using intramedullary nail and other hardware with removing the screws.  The CT scan did not show any evidence of osteonecrosis and showed the fracture is actually healed.  On exam any attempts to examine her right lower extremity are met with severe pain with even examining the hip or the knee.  A lot of this seems to be out of proportion of exam but is certainly concerning that she does have a pain syndrome going on.  At this point this is detrimental effect directives daily living, her mobility, and her quality of life.  At this point is been recommended that she consider disability as it relates to the severity of her right  hip pain and how this is detrimentally affecting her life in general.  From my standpoint it is difficult to recommend any other surgery on her hip based on her clinical exam.  Certainly another option would be considering a total hip arthroplasty this is something we recommended as a potential surgery at the time of her fracture but we still felt with it being nondisplaced this was the appropriate route to address her hip fracture initially.  I am certainly the loss of how this is become so debilitating as it is affecting her body's anatomy and how this is caused the pain syndrome that it has caused however we certainly have seen this in other settings before.  From my standpoint I certainly would defer to any of the specialist at [facility] if they feel it would be appropriate to consider an alternative surgery from their standpoint.  I do feel though she would benefit from disability as it relates to her dealing with this injury and I will try to be helpful in any way that I can in helping establish her being on full body disability.  I did review the CT scans of both hips that were done earlier this year and it does show that her right hip fracture completely healed with no evidence of avascular necrosis.  It was interesting that our radiologist measured her anteversion  of the right hip at 12 degrees in the left hip at 8 degrees in terms of the femoral neck position.  This is only a 4 degree difference.

## 2018-05-17 ENCOUNTER — Ambulatory Visit (INDEPENDENT_AMBULATORY_CARE_PROVIDER_SITE_OTHER): Payer: 59 | Admitting: Sports Medicine

## 2018-05-17 ENCOUNTER — Ambulatory Visit: Payer: 59 | Admitting: Sports Medicine

## 2018-05-17 VITALS — BP 112/70 | Ht 66.0 in | Wt 160.0 lb

## 2018-05-17 DIAGNOSIS — M25512 Pain in left shoulder: Secondary | ICD-10-CM | POA: Diagnosis not present

## 2018-05-17 MED ORDER — NITROGLYCERIN 0.2 MG/HR TD PT24: MEDICATED_PATCH | TRANSDERMAL | 1 refills | Status: DC

## 2018-05-17 MED FILL — NITROGLYCERIN 0.2 MG/HR PTC: 0.2 | 84 days supply | Qty: 21 | Fill #0

## 2018-05-17 NOTE — Patient Instructions (Signed)

## 2018-05-18 ENCOUNTER — Ambulatory Visit
Admission: RE | Admit: 2018-05-18 | Discharge: 2018-05-18 | Disposition: A | Discharge status: Home / Self Care | Payer: 59 | Source: Ambulatory Visit | Attending: Sports Medicine | Admitting: Sports Medicine

## 2018-05-18 DIAGNOSIS — M1712 Unilateral primary osteoarthritis, left knee: Secondary | ICD-10-CM | POA: Diagnosis not present

## 2018-05-18 DIAGNOSIS — M25512 Pain in left shoulder: Secondary | ICD-10-CM

## 2018-05-18 NOTE — Progress Notes (Signed)
   Subjective:    Patient ID: Felicia Cain, female    DOB: May 21, 1956, 62 y.o.   MRN: [id number]  HPI   Patient comes in at my request for follow-up on bilateral shoulder pain. We injected her subacromial space in both shoulders last week.Overall, she has noticed a slight improvement in her pain. She rates her pain in the left shoulder today is a 3/10. However, she continues to have significant disability with this shoulder. She has difficulty reaching overhead or away from her body. Difficulty grabbing heavy objects. Her pain all began after a possible subluxation/dislocation in August. Right shoulder pain is tolerable.   Review of Systems    as above Objective:   Physical Exam  Well-developed, well-nourished. No acute distress. Awake alert and oriented 3. Vital signs reviewed  Left shoulder: Patient continues to have significantly decreased active range of motion in all planes. She has weakness with resisted supraspinatus today. Pectoralis major tendon appears to be intact. O'Briens is positive. No soft tissue swelling. Neurovascular intact distally.      Assessment & Plan:   Persistent left shoulder pain worrisome for rotator cuff tear  Although the patient's previous ultrasound did not suggest a tear of her rotator cuff, she continues to have significant disability despite treatment including a recent subacromial cortisone injection. I would like to get an MRI of her shoulder specifically to rule out a rotator cuff tear not appreciated on ultrasound. Phone follow-up with those results when available. We will delineate further treatment based on those findings. In the meantime, the patient will start 1/4 patch nitroglycerin over the left shoulder daily. She has had good success with topical nitroglycerin in the past for other soft tissue injuries.

## 2018-05-19 ENCOUNTER — Ambulatory Visit (INDEPENDENT_AMBULATORY_CARE_PROVIDER_SITE_OTHER): Payer: 59 | Admitting: Sports Medicine

## 2018-05-19 VITALS — BP 106/70 | Ht 66.0 in | Wt 160.0 lb

## 2018-05-19 DIAGNOSIS — M25512 Pain in left shoulder: Secondary | ICD-10-CM | POA: Diagnosis not present

## 2018-05-19 NOTE — Progress Notes (Signed)
  Patient comes in to discuss MRI findings of her left shoulder.  Rotator cuff is intact.  She does have some synovitis.  Possible adhesive capsulitis.  I reassured her that she has no operative pathology and have encouraged her to continue with her range of motion exercises at home.  She will take ibuprofen as needed for pain.  Return to the office in 3 to 4 weeks for reevaluation.  We could consider an intra-articular cortisone injection if symptoms persist or worsen but she tends to get nauseous with cortisone injections.  She is encouraged to call with questions or concerns prior to her follow-up visit.

## 2018-05-25 DIAGNOSIS — H18833 Recurrent erosion of cornea, bilateral: Secondary | ICD-10-CM | POA: Diagnosis not present

## 2018-05-25 DIAGNOSIS — H40012 Open angle with borderline findings, low risk, left eye: Secondary | ICD-10-CM | POA: Diagnosis not present

## 2018-05-25 DIAGNOSIS — H16223 Keratoconjunctivitis sicca, not specified as Sjogren's, bilateral: Secondary | ICD-10-CM | POA: Diagnosis not present

## 2018-05-25 DIAGNOSIS — H16123 Filamentary keratitis, bilateral: Secondary | ICD-10-CM | POA: Diagnosis not present

## 2018-05-25 DIAGNOSIS — G4733 Obstructive sleep apnea (adult) (pediatric): Secondary | ICD-10-CM | POA: Diagnosis not present

## 2018-05-25 DIAGNOSIS — G7000 Myasthenia gravis without (acute) exacerbation: Secondary | ICD-10-CM | POA: Diagnosis not present

## 2018-05-25 DIAGNOSIS — H04123 Dry eye syndrome of bilateral lacrimal glands: Secondary | ICD-10-CM | POA: Diagnosis not present

## 2018-05-25 DIAGNOSIS — H2513 Age-related nuclear cataract, bilateral: Secondary | ICD-10-CM | POA: Diagnosis not present

## 2018-05-25 DIAGNOSIS — H468 Other optic neuritis: Secondary | ICD-10-CM | POA: Diagnosis not present

## 2018-05-25 MED FILL — OLOPATADINE HCL 0.2% EYE DR: 0.2 | 75 days supply | Qty: 8 | Fill #0

## 2018-05-27 MED FILL — CLOBETASOL PROPIONATE 0.05: 0.05 | 30 days supply | Qty: 50 | Fill #0

## 2018-06-06 MED FILL — METHOTREXATE SODIUM 2.5 MG: 2.5 | 28 days supply | Qty: 20 | Fill #1

## 2018-06-22 ENCOUNTER — Ambulatory Visit: Payer: 59 | Admitting: Sports Medicine

## 2018-06-23 MED FILL — FINASTERIDE 5 MG TABLET: 5 | 84 days supply | Qty: 21 | Fill #3

## 2018-07-07 DIAGNOSIS — Z23 Encounter for immunization: Secondary | ICD-10-CM | POA: Diagnosis not present

## 2018-07-08 ENCOUNTER — Encounter: Payer: Self-pay | Admitting: Plastic Surgery

## 2018-07-08 ENCOUNTER — Ambulatory Visit (INDEPENDENT_AMBULATORY_CARE_PROVIDER_SITE_OTHER): Payer: 59 | Admitting: Plastic Surgery

## 2018-07-08 VITALS — BP 110/74 | HR 74 | Ht 66.0 in | Wt 152.0 lb

## 2018-07-08 DIAGNOSIS — N644 Mastodynia: Secondary | ICD-10-CM

## 2018-07-08 DIAGNOSIS — T8544XA Capsular contracture of breast implant, initial encounter: Secondary | ICD-10-CM | POA: Insufficient documentation

## 2018-07-08 DIAGNOSIS — Z9013 Acquired absence of bilateral breasts and nipples: Secondary | ICD-10-CM | POA: Diagnosis not present

## 2018-07-08 DIAGNOSIS — Z9882 Breast implant status: Secondary | ICD-10-CM

## 2018-07-08 DIAGNOSIS — Z9889 Other specified postprocedural states: Secondary | ICD-10-CM

## 2018-07-08 NOTE — Progress Notes (Signed)
Patient ID: Felicia Cain, female    DOB: 1956/08/27, 62 y.o.   MRN: [id number]   Chief Complaint  Patient presents with  . Breast Problem    The patient is a 62 year old white female here for evaluation of her reconstructed breasts.  In 2000 she underwent bilateral mastectomies with reconstruction.  She had expanders placed and then silicone implants in 9485 which she had a rupture of both implants and have them replaced for new silicone implants.  She is not sure if acellular dermal matrix was used for either procedure.  Prior to her surgery she was a 47 DD and now 38 C.  She is 5 feet 6 inches tall.  She has not had any imaging in the last several years.  On exam she has distortion of both breasts with more contracture noted on the left the right breast flaps and the skin is thinner compared to the left.  She describes pain and discomfort almost all the time but especially with movement and bending.  There does not appear to be any signs of infection, swelling, or mass.  She does not have any lymphadenopathy.  She is now on disability due to a fracture of her right femur and poor healing.   Review of Systems  Constitutional: Negative.  Negative for activity change and appetite change.  HENT: Negative.   Eyes: Negative.   Respiratory: Negative.   Cardiovascular: Negative.   Gastrointestinal: Negative.   Endocrine: Negative.   Genitourinary: Negative.   Musculoskeletal: Positive for gait problem and joint swelling.  Skin: Negative.   Hematological: Negative.   Psychiatric/Behavioral: Negative.     Past Medical History:  Diagnosis Date  . Anxiety   . Arthritis    "back" (01/25/2017)  . Brachial neuritis    neuropathy  . Chronic lower back pain   . DCIS (ductal carcinoma in situ)    "left side?"  . Esophageal stricture    stricture with dysphasia  . Fibromyalgia   . GERD (gastroesophageal reflux disease)   . H/O Doppler ultrasound 2006   see scanned study  . H/O  echocardiogram 2004, 2013  . Hepatitis A 1961   [place]idem[place]"  . History of cardiac monitoring 2006  . History of nuclear stress test 2004   see scanned study  . Migraine    "in the past; cycle related" (01/25/2017)  . Myasthenia gravis in crisis [facility]) 04/2014   respiratory crisis  . NAION (non-arteritic anterior ischemic optic neuropathy), right   . Near syncope   . Neuromuscular disorder ([place])   . Neuropathy   . Optic neuritis    ischaemic optic neuritis, non arteric  . Optic neuropathy   . PONV (postoperative nausea and vomiting)   . Posterior optic neuritis   . Ptosis of eyelid   . Sleep apnea    "I wear Moses device; I don't wear CPAP" (01/25/2017)  . Spinal stenosis     Past Surgical History:  Procedure Laterality Date  . AUGMENTATION MAMMAPLASTY     2000, after breast cancer surgery,redone 2010  . BACK SURGERY    . BREAST BIOPSY    . CARPAL TUNNEL RELEASE Bilateral   . CESAREAN SECTION  1989  . ESOPHAGOGASTRODUODENOSCOPY (EGD) WITH ESOPHAGEAL DILATION    . HIP PINNING,CANNULATED Right 01/26/2017   Procedure: CANNULATED SCREWS/RIGHT HIP PINNING;  Surgeon: Mcarthur Rossetti, MD;  Location: [place];  Service: Orthopedics;  Laterality: Right;  . INNER EAR SURGERY Right 1995  .  LUMBAR LAMINECTOMY Right 1999   [name]  . MASTECTOMY Bilateral       Current Outpatient Medications:  .  cholecalciferol (VITAMIN D) 1000 UNITS tablet, Take 5,000 Units by mouth daily. , Disp: , Rfl:  .  desvenlafaxine (PRISTIQ) 50 MG 24 hr tablet, Take 1 tablet (50 mg total) by mouth daily., Disp: 90 tablet, Rfl: 3 .  aspirin 81 MG chewable tablet, Chew by mouth., Disp: , Rfl:  .  Doxycycline Hyclate 50 MG TABS, Take 100 mg by mouth daily. , Disp: , Rfl:  .  gabapentin (NEURONTIN) 300 MG capsule, Take 1 capsule (300 mg total) by mouth 3 (three) times daily., Disp: 90 capsule, Rfl: 1 .  modafinil (PROVIGIL) 100 MG tablet, Take 1 tablet (100 mg total) by mouth daily. (Patient not  taking: Reported on 05/17/2018), Disp: 90 tablet, Rfl: 3 .  nitroGLYCERIN (NITRODUR - DOSED IN MG/24 HR) 0.2 mg/hr patch, Use 1/4 patch daily to the affected area., Disp: 30 patch, Rfl: 1 .  pyridostigmine (MESTINON) 60 MG tablet, Up to tid po., Disp: 90 tablet, Rfl: 5   Objective:   Vitals:   07/08/18 1203  BP: 110/74  Pulse: 74  SpO2: 98%    Physical Exam  Constitutional: She is oriented to person, place, and time. She appears well-developed and well-nourished.  HENT:  Head: Normocephalic and atraumatic.  Eyes: Pupils are equal, round, and reactive to light.  Cardiovascular: Normal rate.  Pulmonary/Chest: Effort normal.  Neurological: She is alert and oriented to person, place, and time.  Psychiatric: She has a normal mood and affect. Her behavior is normal. Judgment and thought content normal.    Assessment & Plan:  S/P bilateral mastectomy  History of reconstruction of both breasts  Capsular contracture of breast implant, initial encounter  Breast pain Recommend MRI for evaluation of the breast implants and pain.  She is going to call with her implant size and type that she currently has in place.  [place], DO

## 2018-07-10 MED FILL — METHOTREXATE SODIUM 2.5 MG: 2.5 | 28 days supply | Qty: 20 | Fill #2

## 2018-07-11 ENCOUNTER — Telehealth: Payer: Self-pay | Admitting: Plastic Surgery

## 2018-07-11 MED FILL — FOLIC ACID 1 MG TABS: 1 | 60 days supply | Qty: 120 | Fill #2

## 2018-07-11 NOTE — Telephone Encounter (Signed)
Patient calling to follow up with Dr. Marla Roe or Asencion Partridge Mayo,PA about conversation with [facility]. Patient requesting call back

## 2018-07-12 ENCOUNTER — Ambulatory Visit (INDEPENDENT_AMBULATORY_CARE_PROVIDER_SITE_OTHER): Payer: 59 | Admitting: Sports Medicine

## 2018-07-12 VITALS — BP 123/73 | Ht 66.0 in | Wt 149.0 lb

## 2018-07-12 DIAGNOSIS — M25572 Pain in left ankle and joints of left foot: Secondary | ICD-10-CM

## 2018-07-12 DIAGNOSIS — G8929 Other chronic pain: Secondary | ICD-10-CM

## 2018-07-13 ENCOUNTER — Encounter: Payer: Self-pay | Admitting: Sports Medicine

## 2018-07-13 NOTE — Progress Notes (Signed)
   Subjective:    Patient ID: Felicia Cain, female    DOB: 1956-01-14, 62 y.o.   MRN: [id number]  HPI   Patient comes in today for follow-up on left shoulder pain.  Pain continues to improve.  Range of motion is also improving.  She still endorses pain along the lateral shoulder but not as severe as it was.  Her main complaint today is left ankle pain.  She has had intermittent pain for several years.  She describes a feeling of locking of the ankle with significant pain along the lateral and anterior aspect which will happen at random.  She does not notice any swelling.  She does wear compression sleeve which is minimally helpful.    Review of Systems As above    Objective:   Physical Exam  Well-developed, well-nourished.  No acute distress.  Awake alert and oriented x3.  Vital signs reviewed  Left shoulder: Full range of motion.  Good strength.  Left ankle: Full range of motion.  No effusion.  No soft tissue swelling.  She is tender in the palpation along the anterior lateral joint line.  She is also tender to palpation along the peroneal tendon.  No tenderness medially.  Neurovascularly intact distally.       Assessment & Plan:   Improving left shoulder pain secondary to rotator cuff tendinopathy and adhesive capsulitis Chronic left ankle pain-rule out loose bodies  Patient shoulder pain and range of motion continue to improve.  She will continue with home exercises.  For her left ankle, I would like to get an MRI specifically to rule out loose bodies which may explain her mechanical symptoms.  Phone follow-up with those results  when available.  We will delineate further treatment based on those findings.

## 2018-07-14 ENCOUNTER — Ambulatory Visit
Admission: RE | Admit: 2018-07-14 | Discharge: 2018-07-14 | Disposition: A | Discharge status: Home / Self Care | Payer: 59 | Source: Ambulatory Visit | Attending: Sports Medicine | Admitting: Sports Medicine

## 2018-07-14 DIAGNOSIS — G8929 Other chronic pain: Secondary | ICD-10-CM

## 2018-07-14 DIAGNOSIS — R600 Localized edema: Secondary | ICD-10-CM | POA: Diagnosis not present

## 2018-07-14 DIAGNOSIS — M25572 Pain in left ankle and joints of left foot: Principal | ICD-10-CM

## 2018-07-14 NOTE — Telephone Encounter (Signed)
Patient called asking if record have been received from [facility] yet. Patient requesting call back for more information about her case. Patient also inquiring if she needs MRI, CT, or X-ray of her chest. No orders have been placed at this time.

## 2018-07-15 ENCOUNTER — Telehealth: Payer: Self-pay | Admitting: Sports Medicine

## 2018-07-15 ENCOUNTER — Other Ambulatory Visit: Payer: Self-pay

## 2018-07-15 ENCOUNTER — Encounter (INDEPENDENT_AMBULATORY_CARE_PROVIDER_SITE_OTHER): Payer: Self-pay | Admitting: Orthopaedic Surgery

## 2018-07-15 DIAGNOSIS — M25572 Pain in left ankle and joints of left foot: Principal | ICD-10-CM

## 2018-07-15 DIAGNOSIS — G8929 Other chronic pain: Secondary | ICD-10-CM

## 2018-07-15 NOTE — Addendum Note (Signed)
Addended by: Wallace Going on: 07/15/2018 04:11 PM   Modules accepted: Orders

## 2018-07-15 NOTE — Telephone Encounter (Signed)
  I spoke with the patient on the phone today after reviewing the MRI of her left ankle.  MRI shows evidence of anterior lateral ankle impingement.  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  Based on these findings I recommended consultation with Dr. Lucia Gaskins to discuss further treatment.  This is somewhat complicated given her chronic right hip pain which has made her become more dependent on her left lower extremity. Patient will follow-up with me as needed.

## 2018-07-20 DIAGNOSIS — S93402A Sprain of unspecified ligament of left ankle, initial encounter: Secondary | ICD-10-CM | POA: Diagnosis not present

## 2018-07-20 NOTE — Addendum Note (Signed)
Addended by: Wallace Going on: 07/20/2018 12:36 PM   Modules accepted: Orders

## 2018-07-25 DIAGNOSIS — S93402D Sprain of unspecified ligament of left ankle, subsequent encounter: Secondary | ICD-10-CM | POA: Diagnosis not present

## 2018-07-26 ENCOUNTER — Ambulatory Visit (HOSPITAL_COMMUNITY): Payer: 59

## 2018-07-26 DIAGNOSIS — S93402D Sprain of unspecified ligament of left ankle, subsequent encounter: Secondary | ICD-10-CM | POA: Diagnosis not present

## 2018-07-27 DIAGNOSIS — H47019 Ischemic optic neuropathy, unspecified eye: Secondary | ICD-10-CM | POA: Diagnosis not present

## 2018-07-27 DIAGNOSIS — Z23 Encounter for immunization: Secondary | ICD-10-CM | POA: Diagnosis not present

## 2018-07-27 DIAGNOSIS — Z8719 Personal history of other diseases of the digestive system: Secondary | ICD-10-CM | POA: Diagnosis not present

## 2018-07-27 DIAGNOSIS — F418 Other specified anxiety disorders: Secondary | ICD-10-CM | POA: Diagnosis not present

## 2018-07-27 DIAGNOSIS — Z0000 Encounter for general adult medical examination without abnormal findings: Secondary | ICD-10-CM | POA: Diagnosis not present

## 2018-07-28 DIAGNOSIS — Z79899 Other long term (current) drug therapy: Secondary | ICD-10-CM | POA: Diagnosis not present

## 2018-07-28 DIAGNOSIS — S93402D Sprain of unspecified ligament of left ankle, subsequent encounter: Secondary | ICD-10-CM | POA: Diagnosis not present

## 2018-07-28 DIAGNOSIS — L814 Other melanin hyperpigmentation: Secondary | ICD-10-CM | POA: Diagnosis not present

## 2018-07-28 DIAGNOSIS — L661 Lichen planopilaris: Secondary | ICD-10-CM | POA: Diagnosis not present

## 2018-07-29 DIAGNOSIS — L739 Follicular disorder, unspecified: Secondary | ICD-10-CM | POA: Diagnosis not present

## 2018-08-01 ENCOUNTER — Other Ambulatory Visit (INDEPENDENT_AMBULATORY_CARE_PROVIDER_SITE_OTHER): Payer: Self-pay | Admitting: Orthopaedic Surgery

## 2018-08-01 MED FILL — CLOBETASOL PROPIONATE 0.05: 0.05 | 30 days supply | Qty: 50 | Fill #0

## 2018-08-01 MED FILL — GABAPENTIN 300 MG CAPSULE: 300 | 30 days supply | Qty: 90 | Fill #0

## 2018-08-01 MED FILL — METHOTREXATE SODIUM 2.5 MG: 2.5 | 28 days supply | Qty: 20 | Fill #0

## 2018-08-01 NOTE — Telephone Encounter (Signed)
Please advise 

## 2018-08-02 DIAGNOSIS — S93402D Sprain of unspecified ligament of left ankle, subsequent encounter: Secondary | ICD-10-CM | POA: Diagnosis not present

## 2018-08-08 DIAGNOSIS — S93402D Sprain of unspecified ligament of left ankle, subsequent encounter: Secondary | ICD-10-CM | POA: Diagnosis not present

## 2018-08-10 DIAGNOSIS — S93402D Sprain of unspecified ligament of left ankle, subsequent encounter: Secondary | ICD-10-CM | POA: Diagnosis not present

## 2018-08-10 MED FILL — SHINGRIX 50 MCG SUS: 50 | 1 days supply | Qty: 1 | Fill #0

## 2018-08-10 MED FILL — AMOXICILLIN 500 MG CAPSULE: 500 | 2 days supply | Qty: 8 | Fill #0

## 2018-08-11 ENCOUNTER — Inpatient Hospital Stay: Admission: RE | Admit: 2018-08-11 | Payer: 59 | Source: Ambulatory Visit

## 2018-08-15 DIAGNOSIS — S93402D Sprain of unspecified ligament of left ankle, subsequent encounter: Secondary | ICD-10-CM | POA: Diagnosis not present

## 2018-08-16 ENCOUNTER — Ambulatory Visit: Payer: 59 | Admitting: Plastic Surgery

## 2018-08-17 DIAGNOSIS — M25571 Pain in right ankle and joints of right foot: Secondary | ICD-10-CM | POA: Diagnosis not present

## 2018-08-17 DIAGNOSIS — S93402D Sprain of unspecified ligament of left ankle, subsequent encounter: Secondary | ICD-10-CM | POA: Diagnosis not present

## 2018-08-18 MED FILL — DESVENLAFAXINE SUC ER 50 MG: 50 | 90 days supply | Qty: 90 | Fill #3

## 2018-08-19 DIAGNOSIS — G4733 Obstructive sleep apnea (adult) (pediatric): Secondary | ICD-10-CM | POA: Diagnosis not present

## 2018-08-19 MED FILL — DOXYCYCLINE HYC 50 MG CAP: 50 | 30 days supply | Qty: 30 | Fill #0

## 2018-08-22 DIAGNOSIS — S93402D Sprain of unspecified ligament of left ankle, subsequent encounter: Secondary | ICD-10-CM | POA: Diagnosis not present

## 2018-08-24 ENCOUNTER — Telehealth: Payer: Self-pay

## 2018-08-24 ENCOUNTER — Ambulatory Visit
Admission: RE | Admit: 2018-08-24 | Discharge: 2018-08-24 | Disposition: A | Discharge status: Home / Self Care | Payer: 59 | Source: Ambulatory Visit | Attending: Plastic Surgery | Admitting: Plastic Surgery

## 2018-08-24 DIAGNOSIS — G8929 Other chronic pain: Secondary | ICD-10-CM

## 2018-08-24 DIAGNOSIS — N644 Mastodynia: Secondary | ICD-10-CM

## 2018-08-24 DIAGNOSIS — M25551 Pain in right hip: Principal | ICD-10-CM

## 2018-08-24 DIAGNOSIS — S93402D Sprain of unspecified ligament of left ankle, subsequent encounter: Secondary | ICD-10-CM | POA: Diagnosis not present

## 2018-08-24 DIAGNOSIS — T8544XA Capsular contracture of breast implant, initial encounter: Secondary | ICD-10-CM

## 2018-08-24 DIAGNOSIS — Z9889 Other specified postprocedural states: Secondary | ICD-10-CM

## 2018-08-24 DIAGNOSIS — Z853 Personal history of malignant neoplasm of breast: Secondary | ICD-10-CM | POA: Diagnosis not present

## 2018-08-24 DIAGNOSIS — Z9882 Breast implant status: Principal | ICD-10-CM

## 2018-08-24 MED ORDER — GADOBUTROL 1 MMOL/ML IV SOLN
8.0000 mL | Freq: Once | INTRAVENOUS | Status: AC | PRN
  Administered 2018-08-24: 8 mL via INTRAVENOUS

## 2018-08-24 NOTE — Telephone Encounter (Signed)
Referral faxed

## 2018-08-26 ENCOUNTER — Encounter: Payer: Self-pay | Admitting: Plastic Surgery

## 2018-08-26 ENCOUNTER — Ambulatory Visit (INDEPENDENT_AMBULATORY_CARE_PROVIDER_SITE_OTHER): Payer: 59 | Admitting: Plastic Surgery

## 2018-08-26 VITALS — BP 122/78 | HR 84 | Ht 66.0 in | Wt 150.0 lb

## 2018-08-26 DIAGNOSIS — N644 Mastodynia: Secondary | ICD-10-CM | POA: Diagnosis not present

## 2018-08-26 DIAGNOSIS — M6281 Muscle weakness (generalized): Secondary | ICD-10-CM | POA: Diagnosis not present

## 2018-08-26 DIAGNOSIS — T8543XA Leakage of breast prosthesis and implant, initial encounter: Secondary | ICD-10-CM

## 2018-08-26 DIAGNOSIS — M25671 Stiffness of right ankle, not elsewhere classified: Secondary | ICD-10-CM | POA: Diagnosis not present

## 2018-08-26 DIAGNOSIS — Z9889 Other specified postprocedural states: Secondary | ICD-10-CM

## 2018-08-26 DIAGNOSIS — Z9882 Breast implant status: Secondary | ICD-10-CM | POA: Diagnosis not present

## 2018-08-26 DIAGNOSIS — M25651 Stiffness of right hip, not elsewhere classified: Secondary | ICD-10-CM | POA: Diagnosis not present

## 2018-08-26 DIAGNOSIS — M25371 Other instability, right ankle: Secondary | ICD-10-CM | POA: Diagnosis not present

## 2018-08-26 DIAGNOSIS — T8544XA Capsular contracture of breast implant, initial encounter: Secondary | ICD-10-CM

## 2018-08-26 NOTE — Progress Notes (Signed)
   Subjective:    Patient ID: Felicia Cain, female    DOB: [birth year], 62 y.o.   MRN: [id number]  Felicia Cain is a 62 year old white female here for further consultation for breast reconstruction.  Her MRI was yesterday and showed a intracapsular rupture on the right of the silicone breast implant.  This is consistent with her exam.  She has bilateral capsular contractures.  She has severe breast pain that is worse on the right.  She currently has bilateral smooth round moderate profile plus gel implants 375 cc.  These were placed in 2010 at [facility].  Her original reconstruction was in 2000 when she had bilateral mastectomies.  She was a 62 DD prior to reconstruction and is now 62 B/C.  She is 5 feet 6 inches tall.  She has a hip fracture that is been acting up and has chronic back pain as well.  She is interested in removal of the capsules and smaller implants.  She is considering no implants at all but she is afraid of being concaved.   Review of Systems  Constitutional: Positive for activity change.  HENT: Negative.   Eyes: Negative.   Respiratory: Negative.   Cardiovascular: Negative.   Gastrointestinal: Negative.   Endocrine: Negative.   Genitourinary: Negative.   Musculoskeletal: Negative.   Skin: Negative.  Negative for color change and wound.  Psychiatric/Behavioral: Negative.   Hip pain and back pain     Objective:   Physical Exam Nursing note reviewed.  Constitutional:      Appearance: Normal appearance.  HENT:     Head: Normocephalic and atraumatic.     Mouth/Throat:     Mouth: Mucous membranes are moist.  Eyes:     Extraocular Movements: Extraocular movements intact.     Pupils: Pupils are equal, round, and reactive to light.  Neck:     Musculoskeletal: Normal range of motion.  Cardiovascular:     Rate and Rhythm: Normal rate.  Pulmonary:     Effort: Pulmonary effort is normal.  Neurological:     General: No focal deficit present.     Mental Status: She is alert.    Psychiatric:        Mood and Affect: Mood normal.        Thought Content: Thought content normal.        Judgment: Judgment normal.       Assessment & Plan:  Capsular contracture of breast implant, initial encounter  Breast pain  History of reconstruction of both breasts  Rupture of implant of right breast, initial encounter Recommend bilateral reconstruction with removal of implants, capsulectomies and placement of smaller implants with excision of excess tissue.

## 2018-08-29 DIAGNOSIS — M25371 Other instability, right ankle: Secondary | ICD-10-CM | POA: Diagnosis not present

## 2018-08-29 DIAGNOSIS — M25571 Pain in right ankle and joints of right foot: Secondary | ICD-10-CM | POA: Diagnosis not present

## 2018-08-29 DIAGNOSIS — M25651 Stiffness of right hip, not elsewhere classified: Secondary | ICD-10-CM | POA: Diagnosis not present

## 2018-08-29 DIAGNOSIS — M6281 Muscle weakness (generalized): Secondary | ICD-10-CM | POA: Diagnosis not present

## 2018-08-29 DIAGNOSIS — M25671 Stiffness of right ankle, not elsewhere classified: Secondary | ICD-10-CM | POA: Diagnosis not present

## 2018-08-30 MED FILL — METHOTREXATE SODIUM 2.5 MG: 2.5 | 28 days supply | Qty: 20 | Fill #1

## 2018-08-30 MED FILL — GABAPENTIN 300 MG CAPSULE: 300 | 30 days supply | Qty: 90 | Fill #1

## 2018-09-01 DIAGNOSIS — M25371 Other instability, right ankle: Secondary | ICD-10-CM | POA: Diagnosis not present

## 2018-09-01 DIAGNOSIS — M25671 Stiffness of right ankle, not elsewhere classified: Secondary | ICD-10-CM | POA: Diagnosis not present

## 2018-09-01 DIAGNOSIS — M6281 Muscle weakness (generalized): Secondary | ICD-10-CM | POA: Diagnosis not present

## 2018-09-01 DIAGNOSIS — M25651 Stiffness of right hip, not elsewhere classified: Secondary | ICD-10-CM | POA: Diagnosis not present

## 2018-09-01 MED FILL — CLOBETASOL PROPIONATE 0.05: 0.05 | 30 days supply | Qty: 50 | Fill #0

## 2018-09-05 DIAGNOSIS — M25371 Other instability, right ankle: Secondary | ICD-10-CM | POA: Diagnosis not present

## 2018-09-05 DIAGNOSIS — M25651 Stiffness of right hip, not elsewhere classified: Secondary | ICD-10-CM | POA: Diagnosis not present

## 2018-09-05 DIAGNOSIS — M6281 Muscle weakness (generalized): Secondary | ICD-10-CM | POA: Diagnosis not present

## 2018-09-05 DIAGNOSIS — M25671 Stiffness of right ankle, not elsewhere classified: Secondary | ICD-10-CM | POA: Diagnosis not present

## 2018-09-08 DIAGNOSIS — M25671 Stiffness of right ankle, not elsewhere classified: Secondary | ICD-10-CM | POA: Diagnosis not present

## 2018-09-08 DIAGNOSIS — M25651 Stiffness of right hip, not elsewhere classified: Secondary | ICD-10-CM | POA: Diagnosis not present

## 2018-09-08 DIAGNOSIS — M25371 Other instability, right ankle: Secondary | ICD-10-CM | POA: Diagnosis not present

## 2018-09-08 DIAGNOSIS — M6281 Muscle weakness (generalized): Secondary | ICD-10-CM | POA: Diagnosis not present

## 2018-09-12 DIAGNOSIS — M25671 Stiffness of right ankle, not elsewhere classified: Secondary | ICD-10-CM | POA: Diagnosis not present

## 2018-09-12 DIAGNOSIS — M25371 Other instability, right ankle: Secondary | ICD-10-CM | POA: Diagnosis not present

## 2018-09-12 DIAGNOSIS — H30142 Acute posterior multifocal placoid pigment epitheliopathy, left eye: Secondary | ICD-10-CM | POA: Diagnosis not present

## 2018-09-12 DIAGNOSIS — M6281 Muscle weakness (generalized): Secondary | ICD-10-CM | POA: Diagnosis not present

## 2018-09-12 DIAGNOSIS — M25372 Other instability, left ankle: Secondary | ICD-10-CM | POA: Diagnosis not present

## 2018-09-12 DIAGNOSIS — M25651 Stiffness of right hip, not elsewhere classified: Secondary | ICD-10-CM | POA: Diagnosis not present

## 2018-09-12 MED FILL — DOXYCYCLINE HYC 50 MG CAP: 50 | 90 days supply | Qty: 90 | Fill #1

## 2018-09-12 MED FILL — FOLIC ACID 1 MG TABS: 1 | 30 days supply | Qty: 60 | Fill #0

## 2018-09-14 DIAGNOSIS — M25651 Stiffness of right hip, not elsewhere classified: Secondary | ICD-10-CM | POA: Diagnosis not present

## 2018-09-14 DIAGNOSIS — M25371 Other instability, right ankle: Secondary | ICD-10-CM | POA: Diagnosis not present

## 2018-09-14 DIAGNOSIS — M6281 Muscle weakness (generalized): Secondary | ICD-10-CM | POA: Diagnosis not present

## 2018-09-14 DIAGNOSIS — M25671 Stiffness of right ankle, not elsewhere classified: Secondary | ICD-10-CM | POA: Diagnosis not present

## 2018-09-15 DIAGNOSIS — M25671 Stiffness of right ankle, not elsewhere classified: Secondary | ICD-10-CM | POA: Diagnosis not present

## 2018-09-15 DIAGNOSIS — M25651 Stiffness of right hip, not elsewhere classified: Secondary | ICD-10-CM | POA: Diagnosis not present

## 2018-09-15 DIAGNOSIS — M25371 Other instability, right ankle: Secondary | ICD-10-CM | POA: Diagnosis not present

## 2018-09-15 DIAGNOSIS — M6281 Muscle weakness (generalized): Secondary | ICD-10-CM | POA: Diagnosis not present

## 2018-09-19 ENCOUNTER — Encounter: Payer: Self-pay | Admitting: Neurology

## 2018-09-19 ENCOUNTER — Ambulatory Visit (INDEPENDENT_AMBULATORY_CARE_PROVIDER_SITE_OTHER): Payer: 59 | Admitting: Neurology

## 2018-09-19 VITALS — BP 115/68 | HR 76 | Ht 66.0 in | Wt 157.0 lb

## 2018-09-19 DIAGNOSIS — M25651 Stiffness of right hip, not elsewhere classified: Secondary | ICD-10-CM | POA: Diagnosis not present

## 2018-09-19 DIAGNOSIS — M25371 Other instability, right ankle: Secondary | ICD-10-CM | POA: Diagnosis not present

## 2018-09-19 DIAGNOSIS — Z9989 Dependence on other enabling machines and devices: Secondary | ICD-10-CM | POA: Diagnosis not present

## 2018-09-19 DIAGNOSIS — M6281 Muscle weakness (generalized): Secondary | ICD-10-CM | POA: Diagnosis not present

## 2018-09-19 DIAGNOSIS — G7001 Myasthenia gravis with (acute) exacerbation: Secondary | ICD-10-CM

## 2018-09-19 DIAGNOSIS — G4733 Obstructive sleep apnea (adult) (pediatric): Secondary | ICD-10-CM

## 2018-09-19 DIAGNOSIS — M25671 Stiffness of right ankle, not elsewhere classified: Secondary | ICD-10-CM | POA: Diagnosis not present

## 2018-09-19 MED ORDER — DESVENLAFAXINE SUCCINATE ER 50 MG PO TB24: 50.0000 mg | ORAL_TABLET | Freq: Every day | ORAL | 3 refills | Status: DC

## 2018-09-19 MED ORDER — MODAFINIL 100 MG PO TABS: 100.0000 mg | ORAL_TABLET | Freq: Every day | ORAL | 3 refills | Status: DC

## 2018-09-19 MED ORDER — PYRIDOSTIGMINE BROMIDE 60 MG PO TABS: ORAL_TABLET | ORAL | 5 refills | Status: DC
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
## 2018-09-20 ENCOUNTER — Encounter: Payer: Self-pay | Admitting: Plastic Surgery

## 2018-09-20 ENCOUNTER — Ambulatory Visit (INDEPENDENT_AMBULATORY_CARE_PROVIDER_SITE_OTHER): Payer: 59 | Admitting: Plastic Surgery

## 2018-09-20 VITALS — BP 120/77 | HR 76 | Temp 97.6°F | Ht 66.0 in | Wt 157.0 lb

## 2018-09-20 DIAGNOSIS — Z1371 Encounter for nonprocreative screening for genetic disease carrier status: Secondary | ICD-10-CM

## 2018-09-20 DIAGNOSIS — M6281 Muscle weakness (generalized): Secondary | ICD-10-CM | POA: Diagnosis not present

## 2018-09-20 DIAGNOSIS — T8544XA Capsular contracture of breast implant, initial encounter: Secondary | ICD-10-CM

## 2018-09-20 DIAGNOSIS — Z9013 Acquired absence of bilateral breasts and nipples: Secondary | ICD-10-CM | POA: Insufficient documentation

## 2018-09-20 DIAGNOSIS — M25371 Other instability, right ankle: Secondary | ICD-10-CM | POA: Diagnosis not present

## 2018-09-20 DIAGNOSIS — M25671 Stiffness of right ankle, not elsewhere classified: Secondary | ICD-10-CM | POA: Diagnosis not present

## 2018-09-20 DIAGNOSIS — T8543XA Leakage of breast prosthesis and implant, initial encounter: Secondary | ICD-10-CM

## 2018-09-20 DIAGNOSIS — M25651 Stiffness of right hip, not elsewhere classified: Secondary | ICD-10-CM | POA: Diagnosis not present

## 2018-09-20 MED ORDER — ONDANSETRON HCL 4 MG PO TABS: 4.0000 mg | ORAL_TABLET | Freq: Three times a day (TID) | ORAL | 0 refills | Status: DC | PRN

## 2018-09-20 MED ORDER — DOXYCYCLINE HYCLATE 100 MG PO TABS: 100.0000 mg | ORAL_TABLET | Freq: Two times a day (BID) | ORAL | 0 refills | Status: DC

## 2018-09-20 MED ORDER — OXYCODONE-ACETAMINOPHEN 5-325 MG PO TABS: 1.0000 | ORAL_TABLET | Freq: Four times a day (QID) | ORAL | 0 refills | Status: AC | PRN

## 2018-09-20 MED FILL — ONDANSETRON HCL 4 MG TABLET: 4 | 7 days supply | Qty: 20 | Fill #0

## 2018-09-20 MED FILL — DOXYCYCLINE HYCLATE 100 MG: 100 | 5 days supply | Qty: 10 | Fill #0

## 2018-09-20 MED FILL — OXYCODONE-ACETAMINOPHEN 5-3: 5-325 | 5 days supply | Qty: 20 | Fill #0

## 2018-09-20 NOTE — Progress Notes (Signed)
Patient ID: Felicia Cain, female    DOB: October 13, 1955, 63 y.o.   MRN: [id number]   Chief Complaint  Patient presents with  . Pre-op Exam    for change in implants    Felicia Cain is a 63 year old female here for her history and physical for breast reconstruction.  She underwent a bilateral mastectomy with reconstruction with implants smooth round moderate profile plus silicone 314 cc.  These were placed in 2010 after original reconstruction was done in 2000.  Prior to the reconstruction she was 64 DD and would now describe herself as 34 B/C.  She underwent a MRI which showed intracapsular rupture on the right side which is consistent with her exam.  She is 5 feet 6 inches tall.  She has not had any recent illnesses and is ready for the surgery which is removal and replacement of her implants with a capsulectomy bilaterally.     Review of Systems  Constitutional: Negative.   HENT: Negative.   Eyes: Negative.   Respiratory: Negative.  Negative for chest tightness.   Cardiovascular: Negative.   Gastrointestinal: Negative.  Negative for abdominal pain.  Endocrine: Negative.   Genitourinary: Negative.   Musculoskeletal: Positive for gait problem and joint swelling.  Skin: Negative.  Negative for color change and wound.  Hematological: Negative.   Psychiatric/Behavioral: Negative.     Past Medical History:  Diagnosis Date  . Anxiety   . Arthritis    "back" (01/25/2017)  . Brachial neuritis    neuropathy  . Chronic lower back pain   . DCIS (ductal carcinoma in situ)    "left side?"  . Esophageal stricture    stricture with dysphasia  . Fibromyalgia   . GERD (gastroesophageal reflux disease)   . H/O Doppler ultrasound 2006   see scanned study  . H/O echocardiogram 2004, 2013  . Hepatitis A 1961   [place]"  . History of cardiac monitoring 2006  . History of nuclear stress test 2004   see scanned study  . Migraine    "in the past; cycle related" (01/25/2017)  .  Myasthenia gravis in crisis [facility]) 04/2014   respiratory crisis  . NAION (non-arteritic anterior ischemic optic neuropathy), right   . Near syncope   . Neuromuscular disorder ([place])   . Neuropathy   . Optic neuritis    ischaemic optic neuritis, non arteric  . Optic neuropathy   . PONV (postoperative nausea and vomiting)   . Posterior optic neuritis   . Ptosis of eyelid   . Sleep apnea    "I wear Moses device; I don't wear CPAP" (01/25/2017)  . Spinal stenosis     Past Surgical History:  Procedure Laterality Date  . AUGMENTATION MAMMAPLASTY     2000, after breast cancer surgery,redone 2010  . BACK SURGERY    . BREAST BIOPSY    . CARPAL TUNNEL RELEASE Bilateral   . CESAREAN SECTION  1989  . ESOPHAGOGASTRODUODENOSCOPY (EGD) WITH ESOPHAGEAL DILATION    . HIP PINNING,CANNULATED Right 01/26/2017   Procedure: CANNULATED SCREWS/RIGHT HIP PINNING;  Surgeon: Mcarthur Rossetti, MD;  Location: [place];  Service: Orthopedics;  Laterality: Right;  . INNER EAR SURGERY Right 1995  . LUMBAR LAMINECTOMY Right 1999   [name]  . MASTECTOMY Bilateral       Current Outpatient Medications:  .  aspirin 81 MG chewable tablet, Chew 81 mg by mouth daily. , Disp: , Rfl:  .  Calcium Carb-Cholecalciferol (CALCIUM 600-D PO),  Take 1 tablet by mouth daily., Disp: , Rfl:  .  cholecalciferol (VITAMIN D) 1000 UNITS tablet, Take 5,000 Units by mouth daily. , Disp: , Rfl:  .  desvenlafaxine (PRISTIQ) 50 MG 24 hr tablet, Take 1 tablet (50 mg total) by mouth daily., Disp: 90 tablet, Rfl: 3 .  Doxycycline Hyclate 50 MG TABS, Take 100 mg by mouth daily. , Disp: , Rfl:  .  gabapentin (NEURONTIN) 300 MG capsule, TAKE 1 CAPSULE BY MOUTH 3 TIMES DAILY, Disp: 90 capsule, Rfl: 1 .  modafinil (PROVIGIL) 100 MG tablet, Take 1 tablet (100 mg total) by mouth daily., Disp: 90 tablet, Rfl: 3 .  pyridostigmine (MESTINON) 60 MG tablet, Up to tid po. Taking prn, Disp: 90 tablet, Rfl: 5 .  doxycycline (VIBRA-TABS) 100 MG tablet,  Take 1 tablet (100 mg total) by mouth 2 (two) times daily., Disp: 10 tablet, Rfl: 0 .  ondansetron (ZOFRAN) 4 MG tablet, Take 1 tablet (4 mg total) by mouth every 8 (eight) hours as needed for nausea or vomiting., Disp: 20 tablet, Rfl: 0 .  oxyCODONE-acetaminophen (PERCOCET) 5-325 MG tablet, Take 1 tablet by mouth every 6 (six) hours as needed for up to 5 days for severe pain., Disp: 20 tablet, Rfl: 0   Objective:   Vitals:   09/20/18 0830  BP: 120/77  Pulse: 76  Temp: 97.6 F (36.4 C)  SpO2: 100%    Physical Exam Vitals signs and nursing note reviewed.  Constitutional:      Appearance: Normal appearance.  HENT:     Head: Normocephalic and atraumatic.     Right Ear: External ear normal.     Left Ear: External ear normal.  Neck:     Musculoskeletal: Normal range of motion.  Cardiovascular:     Rate and Rhythm: Normal rate and regular rhythm.     Pulses: Normal pulses.  Pulmonary:     Effort: Pulmonary effort is normal.  Abdominal:     General: Abdomen is flat. There is no distension.  Skin:    General: Skin is warm.  Neurological:     General: No focal deficit present.     Mental Status: She is alert.  Psychiatric:        Mood and Affect: Mood normal.        Thought Content: Thought content normal.        Judgment: Judgment normal.     Assessment & Plan:  S/P bilateral mastectomy  BRCA negative  Rupture of implant of right breast, initial encounter  Capsular contracture of breast implant, initial encounter  Acquired absence of both breasts Plan for bilateral implant removal and replacement with capsulectomies.  We had a long discussion about the possibility of not being able to put the new implants in.  She is aware and completely understands that that may be a possibility.   [place], DO

## 2018-09-20 NOTE — H&P (View-Only) (Signed)
Patient ID: Felicia Cain, female    DOB: 1955-10-03, 63 y.o.   MRN: [id number]   Chief Complaint  Patient presents with  . Pre-op Exam    for change in implants    Felicia Cain is a 63 year old female here for her history and physical for breast reconstruction.  She underwent a bilateral mastectomy with reconstruction with implants smooth round moderate profile plus silicone 295 cc.  These were placed in 2010 after original reconstruction was done in 2000.  Prior to the reconstruction she was 68 DD and would now describe herself as 65 B/C.  She underwent a MRI which showed intracapsular rupture on the right side which is consistent with her exam.  She is 5 feet 6 inches tall.  She has not had any recent illnesses and is ready for the surgery which is removal and replacement of her implants with a capsulectomy bilaterally.     Review of Systems  Constitutional: Negative.   HENT: Negative.   Eyes: Negative.   Respiratory: Negative.  Negative for chest tightness.   Cardiovascular: Negative.   Gastrointestinal: Negative.  Negative for abdominal pain.  Endocrine: Negative.   Genitourinary: Negative.   Musculoskeletal: Positive for gait problem and joint swelling.  Skin: Negative.  Negative for color change and wound.  Hematological: Negative.   Psychiatric/Behavioral: Negative.     Past Medical History:  Diagnosis Date  . Anxiety   . Arthritis    "back" (01/25/2017)  . Brachial neuritis    neuropathy  . Chronic lower back pain   . DCIS (ductal carcinoma in situ)    "left side?"  . Esophageal stricture    stricture with dysphasia  . Fibromyalgia   . GERD (gastroesophageal reflux disease)   . H/O Doppler ultrasound 2006   see scanned study  . H/O echocardiogram 2004, 2013  . Hepatitis A 1961   [place]epidemic in my city"  . History of cardiac monitoring 2006  . History of nuclear stress test 2004   see scanned study  . Migraine    "in the past; cycle related" (01/25/2017)  .  Myasthenia gravis in crisis [facility]) 04/2014   respiratory crisis  . NAION (non-arteritic anterior ischemic optic neuropathy), right   . Near syncope   . Neuromuscular disorder ([place])   . Neuropathy   . Optic neuritis    ischaemic optic neuritis, non arteric  . Optic neuropathy   . PONV (postoperative nausea and vomiting)   . Posterior optic neuritis   . Ptosis of eyelid   . Sleep apnea    "I wear Moses device; I don't wear CPAP" (01/25/2017)  . Spinal stenosis     Past Surgical History:  Procedure Laterality Date  . AUGMENTATION MAMMAPLASTY     2000, after breast cancer surgery,redone 2010  . BACK SURGERY    . BREAST BIOPSY    . CARPAL TUNNEL RELEASE Bilateral   . CESAREAN SECTION  1989  . ESOPHAGOGASTRODUODENOSCOPY (EGD) WITH ESOPHAGEAL DILATION    . HIP PINNING,CANNULATED Right 01/26/2017   Procedure: CANNULATED SCREWS/RIGHT HIP PINNING;  Surgeon: Mcarthur Rossetti, MD;  Location: [place];  Service: Orthopedics;  Laterality: Right;  . INNER EAR SURGERY Right 1995  . LUMBAR LAMINECTOMY Right 1999   [name]  . MASTECTOMY Bilateral       Current Outpatient Medications:  .  aspirin 81 MG chewable tablet, Chew 81 mg by mouth daily. , Disp: , Rfl:  .  Calcium Carb-Cholecalciferol (CALCIUM 600-D PO),  Take 1 tablet by mouth daily., Disp: , Rfl:  .  cholecalciferol (VITAMIN D) 1000 UNITS tablet, Take 5,000 Units by mouth daily. , Disp: , Rfl:  .  desvenlafaxine (PRISTIQ) 50 MG 24 hr tablet, Take 1 tablet (50 mg total) by mouth daily., Disp: 90 tablet, Rfl: 3 .  Doxycycline Hyclate 50 MG TABS, Take 100 mg by mouth daily. , Disp: , Rfl:  .  gabapentin (NEURONTIN) 300 MG capsule, TAKE 1 CAPSULE BY MOUTH 3 TIMES DAILY, Disp: 90 capsule, Rfl: 1 .  modafinil (PROVIGIL) 100 MG tablet, Take 1 tablet (100 mg total) by mouth daily., Disp: 90 tablet, Rfl: 3 .  pyridostigmine (MESTINON) 60 MG tablet, Up to tid po. Taking prn, Disp: 90 tablet, Rfl: 5 .  doxycycline (VIBRA-TABS) 100 MG tablet,  Take 1 tablet (100 mg total) by mouth 2 (two) times daily., Disp: 10 tablet, Rfl: 0 .  ondansetron (ZOFRAN) 4 MG tablet, Take 1 tablet (4 mg total) by mouth every 8 (eight) hours as needed for nausea or vomiting., Disp: 20 tablet, Rfl: 0 .  oxyCODONE-acetaminophen (PERCOCET) 5-325 MG tablet, Take 1 tablet by mouth every 6 (six) hours as needed for up to 5 days for severe pain., Disp: 20 tablet, Rfl: 0   Objective:   Vitals:   09/20/18 0830  BP: 120/77  Pulse: 76  Temp: 97.6 F (36.4 C)  SpO2: 100%    Physical Exam Vitals signs and nursing note reviewed.  Constitutional:      Appearance: Normal appearance.  HENT:     Head: Normocephalic and atraumatic.     Right Ear: External ear normal.     Left Ear: External ear normal.  Neck:     Musculoskeletal: Normal range of motion.  Cardiovascular:     Rate and Rhythm: Normal rate and regular rhythm.     Pulses: Normal pulses.  Pulmonary:     Effort: Pulmonary effort is normal.  Abdominal:     General: Abdomen is flat. There is no distension.  Skin:    General: Skin is warm.  Neurological:     General: No focal deficit present.     Mental Status: She is alert.  Psychiatric:        Mood and Affect: Mood normal.        Thought Content: Thought content normal.        Judgment: Judgment normal.     Assessment & Plan:  S/P bilateral mastectomy  BRCA negative  Rupture of implant of right breast, initial encounter  Capsular contracture of breast implant, initial encounter  Acquired absence of both breasts Plan for bilateral implant removal and replacement with capsulectomies.  We had a long discussion about the possibility of not being able to put the new implants in.  She is aware and completely understands that that may be a possibility.   [place], DO

## 2018-09-21 ENCOUNTER — Encounter (HOSPITAL_BASED_OUTPATIENT_CLINIC_OR_DEPARTMENT_OTHER): Payer: Self-pay | Admitting: *Deleted

## 2018-09-21 ENCOUNTER — Other Ambulatory Visit: Payer: Self-pay

## 2018-09-28 ENCOUNTER — Encounter (HOSPITAL_BASED_OUTPATIENT_CLINIC_OR_DEPARTMENT_OTHER)
Admission: RE | Disposition: A | Discharge status: Home / Self Care | Payer: Self-pay | Source: Home / Self Care | Attending: Plastic Surgery

## 2018-09-28 ENCOUNTER — Ambulatory Visit (HOSPITAL_BASED_OUTPATIENT_CLINIC_OR_DEPARTMENT_OTHER): Payer: 59 | Admitting: Anesthesiology

## 2018-09-28 ENCOUNTER — Ambulatory Visit (HOSPITAL_BASED_OUTPATIENT_CLINIC_OR_DEPARTMENT_OTHER)
Admission: RE | Admit: 2018-09-28 | Discharge: 2018-09-28 | Disposition: A | Discharge status: Home / Self Care | Payer: 59 | Attending: Plastic Surgery | Admitting: Plastic Surgery

## 2018-09-28 ENCOUNTER — Other Ambulatory Visit: Payer: Self-pay

## 2018-09-28 ENCOUNTER — Encounter (HOSPITAL_BASED_OUTPATIENT_CLINIC_OR_DEPARTMENT_OTHER): Payer: Self-pay | Admitting: Plastic Surgery

## 2018-09-28 DIAGNOSIS — Z9013 Acquired absence of bilateral breasts and nipples: Secondary | ICD-10-CM | POA: Diagnosis not present

## 2018-09-28 DIAGNOSIS — Z01818 Encounter for other preprocedural examination: Secondary | ICD-10-CM | POA: Diagnosis not present

## 2018-09-28 DIAGNOSIS — Z853 Personal history of malignant neoplasm of breast: Secondary | ICD-10-CM | POA: Diagnosis not present

## 2018-09-28 DIAGNOSIS — Y834 Other reconstructive surgery as the cause of abnormal reaction of the patient, or of later complication, without mention of misadventure at the time of the procedure: Secondary | ICD-10-CM | POA: Insufficient documentation

## 2018-09-28 DIAGNOSIS — Z79899 Other long term (current) drug therapy: Secondary | ICD-10-CM | POA: Insufficient documentation

## 2018-09-28 DIAGNOSIS — T85898A Other specified complication of other internal prosthetic devices, implants and grafts, initial encounter: Secondary | ICD-10-CM | POA: Diagnosis not present

## 2018-09-28 DIAGNOSIS — Z7982 Long term (current) use of aspirin: Secondary | ICD-10-CM | POA: Insufficient documentation

## 2018-09-28 DIAGNOSIS — T8544XA Capsular contracture of breast implant, initial encounter: Secondary | ICD-10-CM | POA: Insufficient documentation

## 2018-09-28 DIAGNOSIS — T8543XA Leakage of breast prosthesis and implant, initial encounter: Secondary | ICD-10-CM | POA: Diagnosis not present

## 2018-09-28 DIAGNOSIS — M797 Fibromyalgia: Secondary | ICD-10-CM | POA: Insufficient documentation

## 2018-09-28 DIAGNOSIS — K219 Gastro-esophageal reflux disease without esophagitis: Secondary | ICD-10-CM | POA: Diagnosis not present

## 2018-09-28 HISTORY — PX: BREAST CAPSULECTOMY WITH IMPLANT EXCHANGE: SHX5592

## 2018-09-28 SURGERY — CAPSULECTOMY, BREAST, WITH REPLACEMENT OF IMPLANT
Anesthesia: General | Site: Breast | Laterality: Bilateral

## 2018-09-28 MED ORDER — BUPIVACAINE-EPINEPHRINE 0.25% -1:200000 IJ SOLN
INTRAMUSCULAR | Status: DC | PRN
  Administered 2018-09-28: 8 mL

## 2018-09-28 MED ORDER — SODIUM CHLORIDE (PF) 0.9 % IJ SOLN
INTRAMUSCULAR | Status: AC
  Filled 2018-09-28: qty 10

## 2018-09-28 MED ORDER — FENTANYL CITRATE (PF) 100 MCG/2ML IJ SOLN
50.0000 ug | INTRAMUSCULAR | Status: AC | PRN
  Administered 2018-09-28: 50 ug via INTRAVENOUS
  Administered 2018-09-28 (×2): 25 ug via INTRAVENOUS

## 2018-09-28 MED ORDER — OXYCODONE HCL 5 MG PO TABS: 5.0000 mg | ORAL_TABLET | ORAL | Status: DC | PRN

## 2018-09-28 MED ORDER — FENTANYL CITRATE (PF) 100 MCG/2ML IJ SOLN
25.0000 ug | INTRAMUSCULAR | Status: DC | PRN
  Administered 2018-09-28 (×2): 25 ug via INTRAVENOUS

## 2018-09-28 MED ORDER — PROMETHAZINE HCL 25 MG/ML IJ SOLN
6.2500 mg | Freq: Once | INTRAMUSCULAR | Status: DC | PRN
  Administered 2018-09-28: 6.25 mg via INTRAVENOUS

## 2018-09-28 MED ORDER — OXYCODONE HCL 5 MG PO TABS
ORAL_TABLET | ORAL | Status: AC
  Filled 2018-09-28: qty 1

## 2018-09-28 MED ORDER — FENTANYL CITRATE (PF) 100 MCG/2ML IJ SOLN
INTRAMUSCULAR | Status: AC
  Filled 2018-09-28: qty 2

## 2018-09-28 MED ORDER — ACETAMINOPHEN 10 MG/ML IV SOLN
1000.0000 mg | Freq: Once | INTRAVENOUS | Status: DC | PRN
  Administered 2018-09-28: 1000 mg via INTRAVENOUS

## 2018-09-28 MED ORDER — DEXAMETHASONE SODIUM PHOSPHATE 4 MG/ML IJ SOLN
INTRAMUSCULAR | Status: DC | PRN
  Administered 2018-09-28: 10 mg via INTRAVENOUS

## 2018-09-28 MED ORDER — KETOROLAC TROMETHAMINE 30 MG/ML IJ SOLN
INTRAMUSCULAR | Status: AC
  Filled 2018-09-28: qty 1

## 2018-09-28 MED ORDER — SODIUM CHLORIDE 0.9 % IV SOLN
INTRAVENOUS | Status: AC
  Filled 2018-09-28: qty 500000

## 2018-09-28 MED ORDER — PROPOFOL 10 MG/ML IV BOLUS
INTRAVENOUS | Status: DC | PRN
  Administered 2018-09-28: 200 mg via INTRAVENOUS

## 2018-09-28 MED ORDER — PROMETHAZINE HCL 25 MG/ML IJ SOLN
INTRAMUSCULAR | Status: AC
  Filled 2018-09-28: qty 1

## 2018-09-28 MED ORDER — ONDANSETRON HCL 4 MG/2ML IJ SOLN: 4.0000 mg | Freq: Once | INTRAMUSCULAR | Status: DC | PRN

## 2018-09-28 MED ORDER — ACETAMINOPHEN 650 MG RE SUPP: 650.0000 mg | RECTAL | Status: DC | PRN

## 2018-09-28 MED ORDER — ACETAMINOPHEN 10 MG/ML IV SOLN
INTRAVENOUS | Status: AC
  Filled 2018-09-28: qty 100

## 2018-09-28 MED ORDER — ACETAMINOPHEN 160 MG/5ML PO SOLN: 325.0000 mg | ORAL | Status: DC | PRN

## 2018-09-28 MED ORDER — SCOPOLAMINE 1 MG/3DAYS TD PT72
MEDICATED_PATCH | TRANSDERMAL | Status: AC
  Filled 2018-09-28: qty 1

## 2018-09-28 MED ORDER — PHENOL 1.4 % MT LIQD
1.0000 | OROMUCOSAL | Status: DC | PRN
  Administered 2018-09-28: 1 via OROMUCOSAL
  Filled 2018-09-28: qty 177

## 2018-09-28 MED ORDER — SODIUM CHLORIDE 0.9% FLUSH: 3.0000 mL | INTRAVENOUS | Status: DC | PRN

## 2018-09-28 MED ORDER — DIPHENHYDRAMINE HCL 50 MG/ML IJ SOLN
INTRAMUSCULAR | Status: DC | PRN
  Administered 2018-09-28 (×2): 12.5 mg via INTRAVENOUS

## 2018-09-28 MED ORDER — METHOCARBAMOL 500 MG PO TABS: 500.0000 mg | ORAL_TABLET | Freq: Once | ORAL | Status: DC | PRN

## 2018-09-28 MED ORDER — HYDROMORPHONE HCL 1 MG/ML IJ SOLN
0.2500 mg | INTRAMUSCULAR | Status: DC | PRN
  Administered 2018-09-28: 0.5 mg via INTRAVENOUS

## 2018-09-28 MED ORDER — CEFAZOLIN SODIUM-DEXTROSE 2-4 GM/100ML-% IV SOLN
INTRAVENOUS | Status: AC
  Filled 2018-09-28: qty 100

## 2018-09-28 MED ORDER — BUPIVACAINE-EPINEPHRINE (PF) 0.25% -1:200000 IJ SOLN
INTRAMUSCULAR | Status: AC
  Filled 2018-09-28: qty 30

## 2018-09-28 MED ORDER — LACTATED RINGERS IV SOLN
INTRAVENOUS | Status: DC
  Administered 2018-09-28 (×3): via INTRAVENOUS

## 2018-09-28 MED ORDER — SODIUM CHLORIDE 0.9% FLUSH: 3.0000 mL | Freq: Two times a day (BID) | INTRAVENOUS | Status: DC

## 2018-09-28 MED ORDER — ACETAMINOPHEN 325 MG PO TABS: 325.0000 mg | ORAL_TABLET | ORAL | Status: DC | PRN

## 2018-09-28 MED ORDER — OXYCODONE HCL 5 MG PO TABS
5.0000 mg | ORAL_TABLET | Freq: Once | ORAL | Status: AC | PRN
  Administered 2018-09-28: 5 mg via ORAL

## 2018-09-28 MED ORDER — EPHEDRINE SULFATE-NACL 50-0.9 MG/10ML-% IV SOSY
PREFILLED_SYRINGE | INTRAVENOUS | Status: DC | PRN
  Administered 2018-09-28 (×2): 10 mg via INTRAVENOUS

## 2018-09-28 MED ORDER — SCOPOLAMINE 1 MG/3DAYS TD PT72
1.0000 | MEDICATED_PATCH | Freq: Once | TRANSDERMAL | Status: DC | PRN
  Administered 2018-09-28: 1.5 mg via TRANSDERMAL

## 2018-09-28 MED ORDER — MIDAZOLAM HCL 2 MG/2ML IJ SOLN
INTRAMUSCULAR | Status: AC
  Filled 2018-09-28: qty 2

## 2018-09-28 MED ORDER — ACETAMINOPHEN 325 MG PO TABS: 650.0000 mg | ORAL_TABLET | ORAL | Status: DC | PRN

## 2018-09-28 MED ORDER — ONDANSETRON HCL 4 MG/2ML IJ SOLN
INTRAMUSCULAR | Status: DC | PRN
  Administered 2018-09-28 (×2): 4 mg via INTRAVENOUS

## 2018-09-28 MED ORDER — SODIUM CHLORIDE 0.9 % IV SOLN
INTRAVENOUS | Status: DC | PRN
  Administered 2018-09-28: 500 mL

## 2018-09-28 MED ORDER — CEFAZOLIN SODIUM-DEXTROSE 2-4 GM/100ML-% IV SOLN
2.0000 g | INTRAVENOUS | Status: AC
  Administered 2018-09-28: 2 g via INTRAVENOUS

## 2018-09-28 MED ORDER — KETOROLAC TROMETHAMINE 30 MG/ML IJ SOLN
30.0000 mg | Freq: Once | INTRAMUSCULAR | Status: DC | PRN
  Administered 2018-09-28: 30 mg via INTRAVENOUS

## 2018-09-28 MED ORDER — SODIUM CHLORIDE 0.9 % IV SOLN: 250.0000 mL | INTRAVENOUS | Status: DC | PRN

## 2018-09-28 MED ORDER — OXYCODONE HCL 5 MG/5ML PO SOLN: 5.0000 mg | Freq: Once | ORAL | Status: AC | PRN

## 2018-09-28 MED ORDER — LIDOCAINE HCL (CARDIAC) PF 100 MG/5ML IV SOSY
PREFILLED_SYRINGE | INTRAVENOUS | Status: DC | PRN
  Administered 2018-09-28: 100 mg via INTRAVENOUS

## 2018-09-28 MED ORDER — HYDROMORPHONE HCL 1 MG/ML IJ SOLN
INTRAMUSCULAR | Status: AC
  Filled 2018-09-28: qty 0.5

## 2018-09-28 MED ORDER — ONDANSETRON HCL 4 MG/2ML IJ SOLN
INTRAMUSCULAR | Status: AC
  Filled 2018-09-28: qty 2

## 2018-09-28 MED ORDER — PROPOFOL 500 MG/50ML IV EMUL
INTRAVENOUS | Status: DC | PRN
  Administered 2018-09-28: 15 ug/kg/min via INTRAVENOUS

## 2018-09-28 MED ORDER — MEPERIDINE HCL 25 MG/ML IJ SOLN: 6.2500 mg | INTRAMUSCULAR | Status: DC | PRN

## 2018-09-28 MED ORDER — MIDAZOLAM HCL 2 MG/2ML IJ SOLN
1.0000 mg | INTRAMUSCULAR | Status: DC | PRN
  Administered 2018-09-28: 2 mg via INTRAVENOUS

## 2018-09-28 SURGICAL SUPPLY — 69 items
ADH SKN CLS APL DERMABOND .7 (GAUZE/BANDAGES/DRESSINGS) ×2
BAG DECANTER FOR FLEXI CONT (MISCELLANEOUS) ×3 IMPLANT
BINDER BREAST LRG (GAUZE/BANDAGES/DRESSINGS) ×3 IMPLANT
BINDER BREAST MEDIUM (GAUZE/BANDAGES/DRESSINGS) IMPLANT
BINDER BREAST XLRG (GAUZE/BANDAGES/DRESSINGS) IMPLANT
BINDER BREAST XXLRG (GAUZE/BANDAGES/DRESSINGS) IMPLANT
BIOPATCH RED 1 DISK 7.0 (GAUZE/BANDAGES/DRESSINGS) ×2 IMPLANT
BLADE HEX COATED 2.75 (ELECTRODE) ×3 IMPLANT
BLADE SURG 10 STRL SS (BLADE) ×3 IMPLANT
BLADE SURG 15 STRL LF DISP TIS (BLADE) ×4 IMPLANT
BLADE SURG 15 STRL SS (BLADE) ×6
BNDG GAUZE ELAST 4 BULKY (GAUZE/BANDAGES/DRESSINGS) IMPLANT
CANISTER SUCT 1200ML W/VALVE (MISCELLANEOUS) ×3 IMPLANT
CHLORAPREP W/TINT 26ML (MISCELLANEOUS) ×3 IMPLANT
CORD BIPOLAR FORCEPS 12FT (ELECTRODE) IMPLANT
COVER BACK TABLE 60X90IN (DRAPES) ×3 IMPLANT
COVER MAYO STAND STRL (DRAPES) ×3 IMPLANT
COVER WAND RF STERILE (DRAPES) IMPLANT
DECANTER SPIKE VIAL GLASS SM (MISCELLANEOUS) IMPLANT
DERMABOND ADVANCED (GAUZE/BANDAGES/DRESSINGS) ×1
DERMABOND ADVANCED .7 DNX12 (GAUZE/BANDAGES/DRESSINGS) ×2 IMPLANT
DRAIN CHANNEL 15F RND FF W/TCR (WOUND CARE) ×3 IMPLANT
DRAIN CHANNEL 19F RND (DRAIN) IMPLANT
DRAPE LAPAROSCOPIC ABDOMINAL (DRAPES) ×3 IMPLANT
DRSG PAD ABDOMINAL 8X10 ST (GAUZE/BANDAGES/DRESSINGS) ×6 IMPLANT
ELECT BLADE 4.0 EZ CLEAN MEGAD (MISCELLANEOUS) ×3
ELECT REM PT RETURN 9FT ADLT (ELECTROSURGICAL) ×3
ELECTRODE BLDE 4.0 EZ CLN MEGD (MISCELLANEOUS) ×2 IMPLANT
ELECTRODE REM PT RTRN 9FT ADLT (ELECTROSURGICAL) ×2 IMPLANT
EVACUATOR SILICONE 100CC (DRAIN) ×2 IMPLANT
GAUZE SPONGE 4X4 12PLY STRL LF (GAUZE/BANDAGES/DRESSINGS) IMPLANT
GLOVE BIO SURGEON STRL SZ 6.5 (GLOVE) IMPLANT
GLOVE SURG SS PI 6.5 STRL IVOR (GLOVE) ×21 IMPLANT
GOWN STRL REUS W/ TWL LRG LVL3 (GOWN DISPOSABLE) ×4 IMPLANT
GOWN STRL REUS W/TWL LRG LVL3 (GOWN DISPOSABLE) ×6
IMPL BREAST SILICONE 250CC (Breast) ×4 IMPLANT
IMPLANT BREAST SILICONE 250CC (Breast) ×6 IMPLANT
IV NS 1000ML (IV SOLUTION)
IV NS 1000ML BAXH (IV SOLUTION) IMPLANT
IV NS 500ML (IV SOLUTION)
IV NS 500ML BAXH (IV SOLUTION) IMPLANT
KIT FILL SYSTEM UNIVERSAL (SET/KITS/TRAYS/PACK) IMPLANT
NDL SAFETY ECLIPSE 18X1.5 (NEEDLE) ×2 IMPLANT
NEEDLE HYPO 18GX1.5 SHARP (NEEDLE) ×3
NEEDLE HYPO 25X1 1.5 SAFETY (NEEDLE) ×3 IMPLANT
PACK BASIN DAY SURGERY FS (CUSTOM PROCEDURE TRAY) ×3 IMPLANT
PENCIL BUTTON HOLSTER BLD 10FT (ELECTRODE) ×3 IMPLANT
PIN SAFETY STERILE (MISCELLANEOUS) ×2 IMPLANT
SIZER GEL REUSE 250CC (SIZER) ×3 IMPLANT
SLEEVE SCD COMPRESS KNEE MED (MISCELLANEOUS) ×3 IMPLANT
SPONGE LAP 18X18 RF (DISPOSABLE) ×9 IMPLANT
STRIP SUTURE WOUND CLOSURE 1/2 (SUTURE) IMPLANT
SUT MNCRL AB 4-0 PS2 18 (SUTURE) ×6 IMPLANT
SUT MON AB 3-0 SH 27 (SUTURE) ×12
SUT MON AB 3-0 SH27 (SUTURE) ×8 IMPLANT
SUT MON AB 5-0 PS2 18 (SUTURE) ×6 IMPLANT
SUT PDS 3-0 CT2 (SUTURE) ×6
SUT PDS AB 2-0 CT2 27 (SUTURE) IMPLANT
SUT PDS II 3-0 CT2 27 ABS (SUTURE) ×4 IMPLANT
SUT SILK 2 0 SH (SUTURE) ×3 IMPLANT
SUT VIC AB 3-0 SH 27 (SUTURE)
SUT VIC AB 3-0 SH 27X BRD (SUTURE) IMPLANT
SUT VICRYL 4-0 PS2 18IN ABS (SUTURE) IMPLANT
SYR BULB IRRIGATION 50ML (SYRINGE) ×3 IMPLANT
SYR CONTROL 10ML LL (SYRINGE) ×3 IMPLANT
TOWEL GREEN STERILE FF (TOWEL DISPOSABLE) ×6 IMPLANT
TUBE CONNECTING 20X1/4 (TUBING) ×3 IMPLANT
UNDERPAD 30X30 (UNDERPADS AND DIAPERS) ×6 IMPLANT
YANKAUER SUCT BULB TIP NO VENT (SUCTIONS) ×3 IMPLANT

## 2018-09-28 NOTE — Discharge Instructions (Signed)
Benadryl 25mg  given at 1:45pm Zofran 4mg  given at 4:09pm Tylenol 1,000mg  given at 5:15pm Toradol 30mg  given at 5:12pm Oxycodone 5mg  given at 6:48pm    INSTRUCTIONS FOR AFTER BREAST SURGERY   You are getting ready to undergo breast surgery.  You will likely have some questions about what to expect following your operation.  The following information will help you and your family understand what to expect when you are discharged from the hospital.  Following these guidelines will help ensure a smooth recovery and reduce risks of complications.   Postoperative instructions include information on: diet, wound care, medications and physical activity.  AFTER SURGERY Expect to go home after the procedure.  In some cases, you may need to spend one night in the hospital for observation.  DIET Breast surgery does not require a specific diet.  However, I have to mention that the healthier you eat the better your body can start healing. It is important to increasing your protein intake.  This means limiting the foods with sugar and carbohydrates.  Focus on vegetables and some meat.  If you have any liposuction during your procedure be sure to drink water.  If your urine is bright yellow, then it is concentrated, and you need to drink more water.  As a general rule after surgery, you should have 8 ounces of water every hour while awake.  If you find you are persistently nauseated or unable to take in liquids let us know.  NO TOBACCO USE or EXPOSURE.  This will slow your healing process and increase the risk of a wound.  WOUND CARE You can shower the day after surgery if you don't have a drain.  Use fragrance free soap.  Dial, [place] and [place] are usually mild on the skin. If you have a drain clean with baby wipes until the drain is removed.  If you have steri-strips / tape directly attached to your skin leave them in place. It is OK to get these wet.  No baths, pools or hot tubs for two weeks. We close your  incision to leave the smallest and best-looking scar. No ointment or creams on your incisions until given the go ahead.  Especially not Neosporin (Too many skin reactions with this one).  A few weeks after surgery you can use Mederma and start massaging the scar. We ask you to wear your binder or sports bra for the first 6 weeks around the clock, including while sleeping. This provides added comfort and helps reduce the fluid accumulation at the surgery site.  ACTIVITY No heavy lifting until cleared by the doctor.  This usually means no more than a half-gallon of milk.  It is OK to walk and climb stairs. In fact, moving your legs is very important to decrease your risk of a blood clot.  It will also help keep you from getting deconditioned.  Every 1 to 2 hours get up and walk for 5 minutes. This will help with a quicker recovery back to normal.  Let pain be your guide so you don't do too much.  NO, you cannot do the spring cleaning and don't plan on taking care of anyone else.  This is your time for TLC.  You will be more comfortable if you sleep and rest with your head elevated either with a few pillows under you or in a recliner.  No stomach sleeping for a few months.  WORK Everyone returns to work at different times. As a rough guide, most  people take at least 1 - 2 weeks off prior to returning to work. If you need documentation for your job, bring the forms to your postoperative follow up visit.  DRIVING Arrange for someone to bring you home from the hospital.  You may be able to drive a few days after surgery but not while taking any narcotics or valium.  BOWEL MOVEMENTS Constipation can occur after anesthesia and while taking pain medication.  It is important to stay ahead for your comfort.  We recommend taking Milk of Magnesia (2 tablespoons; twice a day) while taking the pain pills.  SEROMA This is fluid your body tried to put in the surgical site.  This is normal but if it creates tight  skinny skin let us know.  It usually decreases in a few weeks.  WHEN TO CALL Call your surgeon's office if any of the following occur:  Fever 101 degrees F or greater  Excessive bleeding or fluid from the incision site.  Pain that increases over time without aid from the medications  Redness, warmth, or pus draining from incision sites  Persistent nausea or inability to take in liquids  Severe misshapen area that underwent the operation.  Here are some resources:  1. Plastic surgery website: https://www.plasticsurgery.org/for-medical-professionals/education-and-resources/publications/breast-reconstruction-magazine 2. Breast Reconstruction Awareness Campaign:  HotelLives.co.nz 3. Plastic surgery Implant information:  https://www.plasticsurgery.org/patient-safety/breast-implant-safety   Post Anesthesia Home Care Instructions  Activity: Get plenty of rest for the remainder of the day. A responsible individual must stay with you for 24 hours following the procedure.  For the next 24 hours, DO NOT: -Drive a car -Paediatric nurse -Drink alcoholic beverages -Take any medication unless instructed by your physician -Make any legal decisions or sign important papers.  Meals: Start with liquid foods such as gelatin or soup. Progress to regular foods as tolerated. Avoid greasy, spicy, heavy foods. If nausea and/or vomiting occur, drink only clear liquids until the nausea and/or vomiting subsides. Call your physician if vomiting continues.  Special Instructions/Symptoms: Your throat may feel dry or sore from the anesthesia or the breathing tube placed in your throat during surgery. If this causes discomfort, gargle with warm salt water. The discomfort should disappear within 24 hours.  If you had a scopolamine patch placed behind your ear for the management of post- operative nausea and/or vomiting:  1. The medication in the patch is effective for 72 hours, after which it  should be removed.  Wrap patch in a tissue and discard in the trash. Wash hands thoroughly with soap and water. 2. You may remove the patch earlier than 72 hours if you experience unpleasant side effects which may include dry mouth, dizziness or visual disturbances. 3. Avoid touching the patch. Wash your hands with soap and water after contact with the patch.    About my Jackson-Pratt Bulb Drain  What is a Jackson-Pratt bulb? A Jackson-Pratt is a soft, round device used to collect drainage. It is connected to a long, thin drainage catheter, which is held in place by one or two small stiches near your surgical incision site. When the bulb is squeezed, it forms a vacuum, forcing the drainage to empty into the bulb.  Emptying the Jackson-Pratt bulb- To empty the bulb: 1. Release the plug on the top of the bulb. 2. Pour the bulb's contents into a measuring container which your nurse will provide. 3. Record the time emptied and amount of drainage. Empty the drain(s) as often as your     doctor or nurse recommends.  Date                  Time                    Amount (Drain 1)                 Amount (Drain 2)  _____________________________________________________________________  _____________________________________________________________________  _____________________________________________________________________  _____________________________________________________________________  _____________________________________________________________________  _____________________________________________________________________  _____________________________________________________________________  _____________________________________________________________________  Squeezing the Jackson-Pratt Bulb- To squeeze the bulb: 1. Make sure the plug at the top of the bulb is open. 2. Squeeze the bulb tightly in your fist. You will hear air squeezing from the bulb. 3. Replace the plug while  the bulb is squeezed. 4. Use a safety pin to attach the bulb to your clothing. This will keep the catheter from     pulling at the bulb insertion site.  When to call your doctor- Call your doctor if:  Drain site becomes red, swollen or hot.  You have a fever greater than 101 degrees F.  There is oozing at the drain site.  Drain falls out (apply a guaze bandage over the drain hole and secure it with tape).  Drainage increases daily not related to activity patterns. (You will usually have more drainage when you are active than when you are resting.)  Drainage has a bad odor.

## 2018-09-28 NOTE — Anesthesia Procedure Notes (Signed)
Procedure Name: LMA Insertion Date/Time: 09/28/2018 1:40 PM Performed by: Willa Frater, CRNA Pre-anesthesia Checklist: Patient identified, Emergency Drugs available, Suction available and Patient being monitored Patient Re-evaluated:Patient Re-evaluated prior to induction Oxygen Delivery Method: Circle system utilized Preoxygenation: Pre-oxygenation with 100% oxygen Induction Type: IV induction Ventilation: Mask ventilation without difficulty LMA: LMA inserted LMA Size: 4.0 Number of attempts: 1 Airway Equipment and Method: Bite block Placement Confirmation: positive ETCO2 Tube secured with: Tape Dental Injury: Teeth and Oropharynx as per pre-operative assessment

## 2018-09-28 NOTE — Op Note (Addendum)
Op report Bilateral Exchange   DATE OF OPERATION: 09/28/2018  LOCATION: [place]  SURGICAL DIVISION: Plastic Surgery  PREOPERATIVE DIAGNOSES:  1. History of breast cancer.  2. Acquired absence of bilateral breast.   POSTOPERATIVE DIAGNOSES:  1. History of breast cancer.  2. Acquired absence of bilateral breast.   PROCEDURE:  1. Bilateral removal of implants (right ruptured)  2. Placement of bilateral breast implants.  2. Bilateral capsulotomies for implant respositioning.  SURGEON: Jazlyn Tippens [place] Starlynn Klinkner, DO  ASSISTANT: Ronette Deter, PA  ANESTHESIA:  General.   COMPLICATIONS: None.   IMPLANTS: Left - Mentor Smooth Round Moderate Plus Profile Gel 250cc. Ref #[id number].  Serial Number [id number] Right - Mentor Smooth Round Moderate Plus Profile Gel 250cc. Ref #[id number].  Serial Number [id number]  INDICATIONS FOR PROCEDURE:  The patient, Felicia Cain, is a 63 y.o. female born on 1956/04/12, is here for treatment after bilateral mastectomies.  She had silicone implants placed years ago.  She was found to have capsule contractures and a right ruptured silicone implant on her recent MRI. She also had pain from the contractures.  She presents for exchange and capsulectomies. MRN: [id number]  CONSENT:  Informed consent was obtained directly from the patient. Risks, benefits and alternatives were fully discussed. Specific risks including but not limited to bleeding, infection, hematoma, seroma, scarring, pain, implant infection, implant extrusion, capsular contracture, asymmetry, wound healing problems, and need for further surgery were all discussed. The patient did have an ample opportunity to have her questions answered to her satisfaction.   DESCRIPTION OF PROCEDURE:  The patient was taken to the operating room. SCDs were placed and IV antibiotics were given. The patient's chest was prepped and draped in a sterile fashion. A time out was performed and  the implants to be used were identified.    On the right breast: One percent Lidocaine with epinephrine was used to infiltrate at the incision site. The old mastectomy scar was incised.  The mastectomy flaps from the superior and inferior flaps were raised over the pectoralis major muscle as able to help decrease the tension for the closure.  The muscle was extremely thin and only had fibers in some places.  Between the Bovie and the scissors the entire capsule was excised with the implant.  The implant was ruptured in multiple places.  There was no silicone noted outside of the capsule.  Inspection of the pocket showed a healthy tissue but extremely thin muscle and soft tissue for the flaps.  I did not see any signs of biologic matrix.  The pocket was irrigated with antibiotic solution.  Hemostasis was achieved with electrocautery.  The pocket was irrigated with antibiotic solution. Measurements were made and a sizer used to confirm adequate pocket size for the implant dimensions.  New gloves were placed. The implant was soaked in antibiotic solution and then placed in the pocket and oriented appropriately. The pectoralis major muscle was closed with a 3-0 Monocryl suture.  A combination of figure-of-eight and running sutures were utilized. There was significant excess skin that was trimmed but not made too tight. The remaining skin was closed with 4-0 Monocryl deep dermal and 5-0 Monocryl subcuticular stitches.  A drain was placed and secured to the skin with a 3-0 silk.  On the left breast: The old mastectomy scar was incised.  The mastectomy flaps from the superior and inferior flaps were raised over the pectoralis major muscle for several centimeters to minimize tension for the closure. The pectoralis  muscle and serratus were to expose and remove the implant.  Inspection of the pocket showed a reasonable capsule and very very thin.   Lateral capsulectomies were performed to allow for breast pocket  expansion and repositioning of the capsule more medially for better positioning of the implant.  Measurements were made and a sizer utilized to confirm adequate pocket size for the implant dimensions.  Hemostasis was ensured with the electrocautery.  New gloves were applied. The implant was soaked in antibiotic solution and placed in the pocket and oriented appropriately.  The pocket was irrigated with antibiotic solution. The pectoralis major muscle and capsule with the serratus were closed with a 3-0 Monocryl suture. The remaining skin was closed with 4-0 Monocryl deep dermal and 5-0 Monocryl subcuticular stitches.  Dermabond was applied to the incision site. A breast binder and ABDs were placed.  The patient was allowed to wake from anesthesia and taken to the recovery room in satisfactory condition.  The advanced practice practitioner (APP) assisted throughout the case.  The APP was essential in retraction and counter traction when needed to make the case progress smoothly.  This retraction and assistance made it possible to see the tissue plans for the procedure.  The assistance was needed for blood control, tissue re-approximation and assisted with closure of the incision site.

## 2018-09-28 NOTE — Anesthesia Preprocedure Evaluation (Addendum)
Anesthesia Evaluation  Patient identified by MRN, date of birth, ID band Patient awake    Reviewed: Allergy & Precautions, NPO status , Patient's Chart, lab work & pertinent test results  History of Anesthesia Complications (+) PONV and history of anesthetic complications  Airway Mallampati: III  TM Distance: <3 FB Neck ROM: Full    Dental  (+) Teeth Intact   Pulmonary sleep apnea ,    breath sounds clear to auscultation       Cardiovascular negative cardio ROS   Rhythm:Regular     Neuro/Psych  Headaches, Anxiety Myasthenia gravis in remission   Neuromuscular disease    GI/Hepatic Neg liver ROS, GERD  Controlled,  Endo/Other  negative endocrine ROS  Renal/GU      Musculoskeletal  (+) Arthritis , Fibromyalgia -Right hipp fx   Abdominal   Peds  Hematology   Anesthesia Other Findings   Reproductive/Obstetrics                             Anesthesia Physical  Anesthesia Plan  ASA: II  Anesthesia Plan: General   Post-op Pain Management:    Induction: Intravenous  PONV Risk Score and Plan: Scopolamine patch - Pre-op, Midazolam, Dexamethasone, Ondansetron, Treatment may vary due to age or medical condition and Propofol infusion  Airway Management Planned: Oral ETT and LMA  Additional Equipment: None  Intra-op Plan:   Post-operative Plan: Extubation in OR  Informed Consent: I have reviewed the patients History and Physical, chart, labs and discussed the procedure including the risks, benefits and alternatives for the proposed anesthesia with the patient or authorized representative who has indicated his/her understanding and acceptance.     Dental advisory given  Plan Discussed with: CRNA, Surgeon and Anesthesiologist  Anesthesia Plan Comments:        Anesthesia Quick Evaluation

## 2018-09-28 NOTE — Interval H&P Note (Signed)
History and Physical Interval Note:  09/28/2018 11:53 AM  Felicia Cain  has presented today for surgery, with the diagnosis of Capsular Contracture Of Breast Implant, Initial Encounter  The various methods of treatment have been discussed with the patient and family. After consideration of risks, benefits and other options for treatment, the patient has consented to  Procedure(s): Removal of breast bilateral implants and capsulectomies due to rupture and placement of new implants (Bilateral) as a surgical intervention .  The patient's history has been reviewed, patient examined, no change in status, stable for surgery.  I have reviewed the patient's chart and labs.  Questions were answered to the patient's satisfaction.     Felicia Cain Felicia Cain

## 2018-09-28 NOTE — Transfer of Care (Signed)
Immediate Anesthesia Transfer of Care Note  Patient: Felicia Cain  Procedure(s) Performed: REMOVAL OF BILATERAL BREAST IMPLANTS WITH CAPSULECTOMIES AND REPLACEMENTS OF IMPLANTS (Bilateral Breast)  Patient Location: PACU  Anesthesia Type:General  Level of Consciousness: sedated and responds to stimulation  Airway & Oxygen Therapy: Patient Spontanous Breathing and Patient connected to face mask oxygen  Post-op Assessment: Report given to RN and Post -op Vital signs reviewed and stable  Post vital signs: Reviewed and stable  Last Vitals:  Vitals Value Taken Time  BP 102/57 09/28/2018  4:24 PM  Temp    Pulse 78 09/28/2018  4:26 PM  Resp 11 09/28/2018  4:26 PM  SpO2 100 % 09/28/2018  4:26 PM  Vitals shown include unvalidated device data.  Last Pain:  Vitals:   09/28/18 1204  TempSrc: Oral  PainSc: 6       Patients Stated Pain Goal: 3 (62/83/15 1761)  Complications: No apparent anesthesia complications

## 2018-09-28 NOTE — Anesthesia Postprocedure Evaluation (Signed)
   Anesthesia Post Note  Patient: Felicia Cain  Procedure(s) Performed: REMOVAL OF BILATERAL BREAST IMPLANTS WITH CAPSULECTOMIES AND REPLACEMENTS OF IMPLANTS (Bilateral Breast)     Patient location during evaluation: PACU Anesthesia Type: General Level of consciousness: awake and alert Pain management: pain level controlled Vital Signs Assessment: post-procedure vital signs reviewed and stable Respiratory status: spontaneous breathing, nonlabored ventilation, respiratory function stable and patient connected to nasal cannula oxygen Cardiovascular status: blood pressure returned to baseline and stable Postop Assessment: no apparent nausea or vomiting Anesthetic complications: no    Last Vitals:  Vitals:   09/28/18 1645 09/28/18 1700  BP: 119/71 113/65  Pulse: 83 93  Resp: 13 18  Temp:    SpO2: 100% 100%    Last Pain:  Vitals:   09/28/18 1652  TempSrc:   PainSc: 10-Worst pain ever                 Adeja Sarratt

## 2018-09-30 ENCOUNTER — Encounter (HOSPITAL_BASED_OUTPATIENT_CLINIC_OR_DEPARTMENT_OTHER): Payer: Self-pay | Admitting: Plastic Surgery

## 2018-09-30 ENCOUNTER — Telehealth: Payer: Self-pay

## 2018-09-30 MED ORDER — DIAZEPAM 2 MG PO TABS: 2.0000 mg | ORAL_TABLET | Freq: Two times a day (BID) | ORAL | 0 refills | Status: DC | PRN

## 2018-09-30 MED FILL — diazePAM 2 MG TABS: 2 | 5 days supply | Qty: 10 | Fill #0

## 2018-09-30 NOTE — Addendum Note (Signed)
Addended by: Wallace Going on: 09/30/2018 01:27 PM   Modules accepted: Orders

## 2018-09-30 NOTE — Telephone Encounter (Signed)
Call from pt. regarding increased "burning pain" (R) breast/chest - medial aspect. She states she has tried the prescribed pain medication as well as OTC- Ibuprofen, with no relief. She denies any fever or shortness of breath. She states the drain on the right side is draining.  Pt has a f/u appointment on 10/04/2018  I spoke to Dr. Marla Roe, and she suggested a RX of Valium for muscle relaxation  Pt. agrees with plan of care  [place], RNFA

## 2018-10-04 ENCOUNTER — Encounter: Payer: Self-pay | Admitting: Plastic Surgery

## 2018-10-04 ENCOUNTER — Ambulatory Visit (INDEPENDENT_AMBULATORY_CARE_PROVIDER_SITE_OTHER): Payer: 59 | Admitting: Plastic Surgery

## 2018-10-04 VITALS — BP 122/79 | HR 85 | Ht 66.0 in | Wt 151.0 lb

## 2018-10-04 DIAGNOSIS — T8543XA Leakage of breast prosthesis and implant, initial encounter: Secondary | ICD-10-CM

## 2018-10-04 DIAGNOSIS — Z9013 Acquired absence of bilateral breasts and nipples: Secondary | ICD-10-CM

## 2018-10-04 NOTE — Progress Notes (Signed)
Felicia Cain is a 63 year old female here for follow-up on her bilateral breast surgery.  She states she is doing much better than it was several days ago.  The pain has improved.  Her drain output is minimal there is no sign of infection.  The incision is intact and healing well.  She has not looked previously.  Today when she looked she is pleased so far.   The drain was removed without any problems.  She can now shower.  Vaseline to the drain site.  She can expect a little bit of drainage.  I would like to see her back in 2 weeks.

## 2018-10-10 DIAGNOSIS — Z9013 Acquired absence of bilateral breasts and nipples: Secondary | ICD-10-CM | POA: Diagnosis not present

## 2018-10-17 MED FILL — FOLIC ACID 1 MG TABS: 1 | 30 days supply | Qty: 60 | Fill #1

## 2018-10-18 ENCOUNTER — Encounter: Payer: Self-pay | Admitting: Plastic Surgery

## 2018-10-18 ENCOUNTER — Ambulatory Visit (INDEPENDENT_AMBULATORY_CARE_PROVIDER_SITE_OTHER): Payer: 59 | Admitting: Plastic Surgery

## 2018-10-18 VITALS — BP 114/72 | HR 85 | Ht 66.0 in | Wt 150.0 lb

## 2018-10-18 DIAGNOSIS — Z9013 Acquired absence of bilateral breasts and nipples: Secondary | ICD-10-CM

## 2018-10-18 NOTE — Progress Notes (Signed)
Felicia Cain is a 63 year old female here for follow-up after having breast surgery.  She had both implants removed and replaced.  She is doing extremely well.  The right breast was tight.  It is showing signs of loosening around the middle part of the breast.  All the incisions are intact and healing well.  There is no sign of seroma or hematoma.  There is a slight bit of rippling as expected for the thinness of her skin.  The suture knots were removed. She can start to wear padded bras and 2 weeks.

## 2018-10-20 MED FILL — SHINGRIX 50 MCG SUS: 50 | 1 days supply | Qty: 1 | Fill #1

## 2018-10-24 ENCOUNTER — Other Ambulatory Visit: Payer: Self-pay | Admitting: Neurology

## 2018-10-24 MED FILL — CELECOXIB 200 MG CAP: 200 | 30 days supply | Qty: 60 | Fill #1

## 2018-10-24 MED FILL — PYRIDOSTIGMINE BR 60 MG TAB: 60 | 30 days supply | Qty: 90 | Fill #0

## 2018-10-25 ENCOUNTER — Other Ambulatory Visit: Payer: Self-pay | Admitting: Neurology

## 2018-10-25 MED FILL — MODAFINIL 100 MG TABLET: 100 | 90 days supply | Qty: 90 | Fill #0

## 2018-10-27 ENCOUNTER — Other Ambulatory Visit (INDEPENDENT_AMBULATORY_CARE_PROVIDER_SITE_OTHER): Payer: Self-pay | Admitting: Orthopaedic Surgery

## 2018-10-27 ENCOUNTER — Telehealth (INDEPENDENT_AMBULATORY_CARE_PROVIDER_SITE_OTHER): Payer: Self-pay

## 2018-10-27 MED ORDER — TRAMADOL HCL 50 MG PO TABS: 50.0000 mg | ORAL_TABLET | Freq: Four times a day (QID) | ORAL | 0 refills | Status: DC | PRN

## 2018-10-27 MED FILL — traMADol HCL 50 MG TABS: 50 | 4 days supply | Qty: 30 | Fill #0

## 2018-10-27 NOTE — Telephone Encounter (Signed)
Patient requests refill of tramadol. °

## 2018-10-28 ENCOUNTER — Ambulatory Visit (INDEPENDENT_AMBULATORY_CARE_PROVIDER_SITE_OTHER): Payer: 59 | Admitting: Plastic Surgery

## 2018-10-28 ENCOUNTER — Encounter: Payer: Self-pay | Admitting: Plastic Surgery

## 2018-10-28 VITALS — BP 109/73 | HR 68 | Temp 97.6°F | Ht 66.0 in | Wt 150.0 lb

## 2018-10-28 DIAGNOSIS — T8543XA Leakage of breast prosthesis and implant, initial encounter: Secondary | ICD-10-CM

## 2018-10-28 DIAGNOSIS — Z9013 Acquired absence of bilateral breasts and nipples: Secondary | ICD-10-CM

## 2018-10-28 DIAGNOSIS — T8544XA Capsular contracture of breast implant, initial encounter: Secondary | ICD-10-CM

## 2018-10-28 NOTE — Progress Notes (Signed)
   Subjective:    Patient ID: Felicia Cain, female    DOB: 07-06-1956, 63 y.o.   MRN: [id number]  Felicia Cain is a 63 year old white female here for follow-up after bilateral breast reconstruction.  She had removal of ruptured implants and capsular contracture a month ago.  New and smaller implants were placed.  She is happy with the appearance.  She plans to go to [employer] for a sports bra and implants for her bras.  The bra she is using now is a compression bra and is tight on her skin.  She is concerned that the right breast shows some rippling and a slight blue discoloration.  This has been stable since her last visit and does not appear to be any worse.  I see the discoloration that she is referring to and the rippling.  This is because of the thinness of the flaps.  The collar may improve in time but I do not think the rippling will improve.  She asked about fat grafting.  This is an option but would be for several months from now.  I do not see any fluid collection.  The incisions are healing well   Review of Systems  Constitutional: Negative.  Negative for activity change.  HENT: Negative.   Eyes: Negative.   Respiratory: Negative.   Cardiovascular: Negative.   Genitourinary: Negative.        Objective:   Physical Exam Vitals signs and nursing note reviewed.  Constitutional:      Appearance: Normal appearance.  HENT:     Nose: Nose normal.  Cardiovascular:     Rate and Rhythm: Normal rate.  Pulmonary:     Effort: Pulmonary effort is normal.  Neurological:     Mental Status: She is alert.  Psychiatric:        Mood and Affect: Mood normal.        Thought Content: Thought content normal.        Judgment: Judgment normal.        Assessment & Plan:  S/P bilateral mastectomy  Rupture of implant of right breast, initial encounter  Acquired absence of both breasts  Capsular contracture of breast implant, initial encounter  The patient had questions about the  compression bra.  She can stop wearing the compression and move into a sports bra.  She asked about laying on her stomach.  I do not think that this is a good idea and she should wait another month.  In 2 weeks she can start using mederma.  I would like to see her back in 1 to 2 months

## 2018-10-31 ENCOUNTER — Encounter (INDEPENDENT_AMBULATORY_CARE_PROVIDER_SITE_OTHER): Payer: Self-pay | Admitting: Physician Assistant

## 2018-10-31 ENCOUNTER — Ambulatory Visit (INDEPENDENT_AMBULATORY_CARE_PROVIDER_SITE_OTHER): Payer: 59 | Admitting: Physician Assistant

## 2018-10-31 ENCOUNTER — Other Ambulatory Visit (INDEPENDENT_AMBULATORY_CARE_PROVIDER_SITE_OTHER): Payer: Self-pay | Admitting: Orthopaedic Surgery

## 2018-10-31 DIAGNOSIS — Z9013 Acquired absence of bilateral breasts and nipples: Secondary | ICD-10-CM | POA: Diagnosis not present

## 2018-10-31 DIAGNOSIS — M25551 Pain in right hip: Secondary | ICD-10-CM

## 2018-10-31 DIAGNOSIS — M25572 Pain in left ankle and joints of left foot: Secondary | ICD-10-CM | POA: Diagnosis not present

## 2018-10-31 MED FILL — GABAPENTIN 300 MG CAPSULE: 300 | 30 days supply | Qty: 90 | Fill #0

## 2018-10-31 MED FILL — CLOBETASOL PROPIONATE 0.05: 0.05 | 30 days supply | Qty: 50 | Fill #0

## 2018-10-31 NOTE — Progress Notes (Addendum)
HPI Mrs.Sentell comes in today due to right hip pain.  Again she underwent cannulated pinning of the right hip fracture 01/26/2017.  She continues to have pain in the hip.  She ambulates with a cane.  She is also having left ankle pain is been told she has edema of the bone marrow and cartilage tear.  She is currently wearing a left ankle ASO.  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  She is asking for prescription for physical therapy to work on range of motion of her ankle and of her hip.  She is also needing refill her tramadol and gabapentin which was done by Dr. Ninfa Linden already last week. She was seen by a hip specialist in [facility] who felt she had significant right hip anteversion compared to the left hip.  He recommended potential osteotomy to rotate the femur using an IM nail.  However she does not wish to proceed with any surgery for this.  Again she would like to undergo therapy.  Physical exam: Right hip she has severe pain with internal rotation and rotation of the hip internally is severely limited.  External rotation slightly decreased but not particularly painful.  She ambulates with a cane and an antalgic gait.  Impression: Chronic right hip pain status post cannulated pinning 01/26/2017 Left ankle pain/anterior lateral ankle impingement  Plan: We did call the pharmacy and they have both the prescription for the tramadol and gabapentin available for her.  She is given a prescription for physical therapy to work on gait balance strengthening of lower extremities.  She will follow-up with [place] on as-needed basis.

## 2018-10-31 NOTE — Telephone Encounter (Signed)
Please advise 

## 2018-11-01 DIAGNOSIS — L814 Other melanin hyperpigmentation: Secondary | ICD-10-CM | POA: Diagnosis not present

## 2018-11-01 DIAGNOSIS — L661 Lichen planopilaris: Secondary | ICD-10-CM | POA: Diagnosis not present

## 2018-11-01 DIAGNOSIS — L719 Rosacea, unspecified: Secondary | ICD-10-CM | POA: Diagnosis not present

## 2018-11-01 DIAGNOSIS — Z79899 Other long term (current) drug therapy: Secondary | ICD-10-CM | POA: Diagnosis not present

## 2018-11-11 DIAGNOSIS — M25651 Stiffness of right hip, not elsewhere classified: Secondary | ICD-10-CM | POA: Diagnosis not present

## 2018-11-11 DIAGNOSIS — M25671 Stiffness of right ankle, not elsewhere classified: Secondary | ICD-10-CM | POA: Diagnosis not present

## 2018-11-11 DIAGNOSIS — M6281 Muscle weakness (generalized): Secondary | ICD-10-CM | POA: Diagnosis not present

## 2018-11-11 DIAGNOSIS — Z9013 Acquired absence of bilateral breasts and nipples: Secondary | ICD-10-CM | POA: Diagnosis not present

## 2018-11-11 DIAGNOSIS — H30142 Acute posterior multifocal placoid pigment epitheliopathy, left eye: Secondary | ICD-10-CM | POA: Diagnosis not present

## 2018-11-11 DIAGNOSIS — M25372 Other instability, left ankle: Secondary | ICD-10-CM | POA: Diagnosis not present

## 2018-11-16 ENCOUNTER — Other Ambulatory Visit: Payer: Self-pay

## 2018-11-16 ENCOUNTER — Ambulatory Visit
Admission: RE | Admit: 2018-11-16 | Discharge: 2018-11-16 | Disposition: A | Discharge status: Home / Self Care | Payer: 59 | Source: Ambulatory Visit | Attending: Sports Medicine | Admitting: Sports Medicine

## 2018-11-16 ENCOUNTER — Ambulatory Visit (INDEPENDENT_AMBULATORY_CARE_PROVIDER_SITE_OTHER): Payer: 59 | Admitting: Sports Medicine

## 2018-11-16 VITALS — BP 108/74 | Ht 66.0 in | Wt 150.0 lb

## 2018-11-16 DIAGNOSIS — R0781 Pleurodynia: Secondary | ICD-10-CM

## 2018-11-16 DIAGNOSIS — M25671 Stiffness of right ankle, not elsewhere classified: Secondary | ICD-10-CM | POA: Diagnosis not present

## 2018-11-16 DIAGNOSIS — R0789 Other chest pain: Secondary | ICD-10-CM | POA: Diagnosis not present

## 2018-11-16 DIAGNOSIS — M6281 Muscle weakness (generalized): Secondary | ICD-10-CM | POA: Diagnosis not present

## 2018-11-16 DIAGNOSIS — S299XXA Unspecified injury of thorax, initial encounter: Secondary | ICD-10-CM | POA: Diagnosis not present

## 2018-11-16 DIAGNOSIS — M25651 Stiffness of right hip, not elsewhere classified: Secondary | ICD-10-CM | POA: Diagnosis not present

## 2018-11-16 DIAGNOSIS — H30142 Acute posterior multifocal placoid pigment epitheliopathy, left eye: Secondary | ICD-10-CM | POA: Diagnosis not present

## 2018-11-16 DIAGNOSIS — M25372 Other instability, left ankle: Secondary | ICD-10-CM | POA: Diagnosis not present

## 2018-11-16 NOTE — Progress Notes (Signed)
   HPI  CC: Right-sided rib pain  Felicia Cain is a 63 year old female who presents for right rib pain.  She fell on 11/10/2018.  She hit her right ribs on the corner of a desk as she fell.  She states she had acute bruising and pain after this.  She states the pain is persisted since that time.  She states she has had pain taking deep breaths.  She is also had pain with any acute motions.  She states the pain is waking her from sleep.  She has tramadol and oxycodone, but has not been taking them.  She denies any diffuse abdominal pain.  She denies any fevers or chills.  She denies any change in bowel function.  See HPI and/or previous note for associated ROS.  Objective: BP 108/74   Ht 5\' 6"  (1.676 m)   Wt 150 lb (68 kg)   BMI 24.21 kg/m  Gen: Right-Hand Dominant. NAD, well groomed, a/o x3, normal affect.  CV: Well-perfused. Warm.  Resp: Non-labored.  Neuro: Sensation intact throughout. No gross coordination deficits.  Gait: Nonpathologic posture, unremarkable stride without signs of limp or balance issues.  Abdominal pain: No erythema, warmth, swelling noted.  Ecchymosis noted along the left 12th rib on the lateral aspect of the right side.  No crepitus or step-offs noted.  No tenderness palpation over the spleen or the liver.  No plateau or splenomegaly noted.  Bowel sounds present in all 4 quadrants.  No peritoneal signs.  No rebound tenderness.  Assessment and plan: Right-sided rib pain, status post fall on March 5.  We discussed treatment options at today's visit.  I will obtain x-rays of her ribs at today's visit to assess for rib fractures.  She has tramadol and oxycodone for pain relief already, advised her to start using 1 of these for nighttime pain.  I did give her a rib belt today to for acute pain relief.  I will review the x-rays and call her with the results.  I will see her back in 7 to 10 days for follow-up on this.  Lewanda Rife, MD [facility] 11/16/2018  12:35 PM  I was the preceptor for this visit and available for immediate consultation Shellia Cleverly, DO

## 2018-11-17 ENCOUNTER — Encounter: Payer: Self-pay | Admitting: Sports Medicine

## 2018-11-17 ENCOUNTER — Ambulatory Visit: Payer: 59 | Admitting: Sports Medicine

## 2018-11-21 DIAGNOSIS — C50912 Malignant neoplasm of unspecified site of left female breast: Secondary | ICD-10-CM | POA: Diagnosis not present

## 2018-11-21 DIAGNOSIS — Z9011 Acquired absence of right breast and nipple: Secondary | ICD-10-CM | POA: Diagnosis not present

## 2018-11-22 DIAGNOSIS — M6281 Muscle weakness (generalized): Secondary | ICD-10-CM | POA: Diagnosis not present

## 2018-11-22 DIAGNOSIS — M25372 Other instability, left ankle: Secondary | ICD-10-CM | POA: Diagnosis not present

## 2018-11-22 DIAGNOSIS — L738 Other specified follicular disorders: Secondary | ICD-10-CM | POA: Diagnosis not present

## 2018-11-22 DIAGNOSIS — I781 Nevus, non-neoplastic: Secondary | ICD-10-CM | POA: Diagnosis not present

## 2018-11-22 DIAGNOSIS — M25651 Stiffness of right hip, not elsewhere classified: Secondary | ICD-10-CM | POA: Diagnosis not present

## 2018-11-22 DIAGNOSIS — L719 Rosacea, unspecified: Secondary | ICD-10-CM | POA: Diagnosis not present

## 2018-11-23 DIAGNOSIS — M25372 Other instability, left ankle: Secondary | ICD-10-CM | POA: Diagnosis not present

## 2018-11-23 DIAGNOSIS — M25651 Stiffness of right hip, not elsewhere classified: Secondary | ICD-10-CM | POA: Diagnosis not present

## 2018-11-23 DIAGNOSIS — M6281 Muscle weakness (generalized): Secondary | ICD-10-CM | POA: Diagnosis not present

## 2018-11-24 ENCOUNTER — Ambulatory Visit (INDEPENDENT_AMBULATORY_CARE_PROVIDER_SITE_OTHER): Payer: 59 | Admitting: Sports Medicine

## 2018-11-24 ENCOUNTER — Other Ambulatory Visit: Payer: Self-pay

## 2018-11-24 DIAGNOSIS — R0781 Pleurodynia: Secondary | ICD-10-CM | POA: Diagnosis not present

## 2018-11-24 DIAGNOSIS — H468 Other optic neuritis: Secondary | ICD-10-CM | POA: Diagnosis not present

## 2018-11-24 DIAGNOSIS — H16223 Keratoconjunctivitis sicca, not specified as Sjogren's, bilateral: Secondary | ICD-10-CM | POA: Diagnosis not present

## 2018-11-24 DIAGNOSIS — H40012 Open angle with borderline findings, low risk, left eye: Secondary | ICD-10-CM | POA: Diagnosis not present

## 2018-11-24 DIAGNOSIS — H04123 Dry eye syndrome of bilateral lacrimal glands: Secondary | ICD-10-CM | POA: Diagnosis not present

## 2018-11-24 NOTE — Assessment & Plan Note (Signed)
Cont with conservative care  We changed to a female rib belt to better cover a larger area  Female belt not working  Ibuprofen  Ice  Reck if problems

## 2018-11-24 NOTE — Progress Notes (Signed)
CC; Rib pain  Evaluated 3/11 Fall onto edge os a desk and rib pain that awakened her from sleep XR on 3/11 did not show rib fractures Still put in rib belt which helps  Pain was not better with Oxycodone or tramadol Ibuprofen 600 has helped more than anything so she continues with this  If she takes rib belt off the ribs pop and hurt  ROS No trouble breathing No SOB No Numbness Has some pain under breasts but also had recent surgery on both  PE Pleasant F in NAD BP 109/65   Temp 97.9 F (36.6 C) (Oral)   Ht 5\' 6"  (1.676 m)   Wt 150 lb (68 kg)   BMI 24.21 kg/m   Lungs clear A and P TTP over "floating ribs and particularly T 10 With deep breaths the lower ribs "pop" However, no step off No severe TTP or bruising

## 2018-11-25 ENCOUNTER — Encounter: Payer: Self-pay | Admitting: Plastic Surgery

## 2018-11-25 ENCOUNTER — Ambulatory Visit (INDEPENDENT_AMBULATORY_CARE_PROVIDER_SITE_OTHER): Payer: 59 | Admitting: Plastic Surgery

## 2018-11-25 ENCOUNTER — Ambulatory Visit: Payer: 59 | Admitting: Plastic Surgery

## 2018-11-25 VITALS — BP 118/76 | HR 71 | Temp 97.8°F | Ht 66.0 in | Wt 158.6 lb

## 2018-11-25 DIAGNOSIS — H30142 Acute posterior multifocal placoid pigment epitheliopathy, left eye: Secondary | ICD-10-CM | POA: Diagnosis not present

## 2018-11-25 DIAGNOSIS — M6281 Muscle weakness (generalized): Secondary | ICD-10-CM | POA: Diagnosis not present

## 2018-11-25 DIAGNOSIS — M25372 Other instability, left ankle: Secondary | ICD-10-CM | POA: Diagnosis not present

## 2018-11-25 DIAGNOSIS — M25671 Stiffness of right ankle, not elsewhere classified: Secondary | ICD-10-CM | POA: Diagnosis not present

## 2018-11-25 DIAGNOSIS — Z9013 Acquired absence of bilateral breasts and nipples: Secondary | ICD-10-CM

## 2018-11-25 DIAGNOSIS — M25651 Stiffness of right hip, not elsewhere classified: Secondary | ICD-10-CM | POA: Diagnosis not present

## 2018-11-25 NOTE — Progress Notes (Signed)
   Subjective:    Patient ID: Felicia Cain, female    DOB: 11-18-1955, 63 y.o.   MRN: [id number]  The patient is a 63 yrs old wf here for follow up on her bilateral breast reconstuction.  She underwent removal of implants and capsule contracture.  The incisions are healing nicely.  She has rippling due to the thin skin and muscle.  No sign of infection.  There is some relaxation of the right implant compared to the left.  This is not unexpected due to the severe thinness of the tissue.   Review of Systems  Constitutional: Positive for activity change. Negative for appetite change.  HENT: Negative.   Eyes: Negative.   Respiratory: Negative.  Negative for cough, chest tightness and shortness of breath.   Cardiovascular: Negative.   Gastrointestinal: Negative.   Genitourinary: Negative.   Musculoskeletal: Negative.   Hematological: Negative.   Psychiatric/Behavioral: Negative.        Objective:   Physical Exam Constitutional:      Appearance: Normal appearance.  HENT:     Head: Normocephalic and atraumatic.     Nose: Nose normal.  Cardiovascular:     Rate and Rhythm: Normal rate.     Pulses: Normal pulses.  Abdominal:     General: Abdomen is flat. There is no distension.  Skin:    General: Skin is warm.  Neurological:     Mental Status: She is alert. Mental status is at baseline.  Psychiatric:        Mood and Affect: Mood normal.        Behavior: Behavior normal.        Assessment & Plan:  Acquired absence of both breasts  Light massage.  Consider lighter bra implants.  Fat grafting is an option for improved contour.  She would like to do that before September.

## 2018-11-28 DIAGNOSIS — M6281 Muscle weakness (generalized): Secondary | ICD-10-CM | POA: Diagnosis not present

## 2018-11-28 DIAGNOSIS — G4733 Obstructive sleep apnea (adult) (pediatric): Secondary | ICD-10-CM | POA: Diagnosis not present

## 2018-11-28 DIAGNOSIS — G7000 Myasthenia gravis without (acute) exacerbation: Secondary | ICD-10-CM | POA: Diagnosis not present

## 2018-11-28 DIAGNOSIS — M25372 Other instability, left ankle: Secondary | ICD-10-CM | POA: Diagnosis not present

## 2018-11-28 DIAGNOSIS — M25651 Stiffness of right hip, not elsewhere classified: Secondary | ICD-10-CM | POA: Diagnosis not present

## 2018-11-30 DIAGNOSIS — M25651 Stiffness of right hip, not elsewhere classified: Secondary | ICD-10-CM | POA: Diagnosis not present

## 2018-11-30 DIAGNOSIS — M6281 Muscle weakness (generalized): Secondary | ICD-10-CM | POA: Diagnosis not present

## 2018-11-30 DIAGNOSIS — M25372 Other instability, left ankle: Secondary | ICD-10-CM | POA: Diagnosis not present

## 2018-12-02 DIAGNOSIS — M25372 Other instability, left ankle: Secondary | ICD-10-CM | POA: Diagnosis not present

## 2018-12-02 DIAGNOSIS — Z9011 Acquired absence of right breast and nipple: Secondary | ICD-10-CM | POA: Diagnosis not present

## 2018-12-02 DIAGNOSIS — M25651 Stiffness of right hip, not elsewhere classified: Secondary | ICD-10-CM | POA: Diagnosis not present

## 2018-12-02 DIAGNOSIS — M6281 Muscle weakness (generalized): Secondary | ICD-10-CM | POA: Diagnosis not present

## 2018-12-02 DIAGNOSIS — C50912 Malignant neoplasm of unspecified site of left female breast: Secondary | ICD-10-CM | POA: Diagnosis not present

## 2018-12-05 DIAGNOSIS — M25651 Stiffness of right hip, not elsewhere classified: Secondary | ICD-10-CM | POA: Diagnosis not present

## 2018-12-05 DIAGNOSIS — M6281 Muscle weakness (generalized): Secondary | ICD-10-CM | POA: Diagnosis not present

## 2018-12-05 DIAGNOSIS — M25372 Other instability, left ankle: Secondary | ICD-10-CM | POA: Diagnosis not present

## 2018-12-05 DIAGNOSIS — C50912 Malignant neoplasm of unspecified site of left female breast: Secondary | ICD-10-CM | POA: Diagnosis not present

## 2018-12-05 DIAGNOSIS — Z9011 Acquired absence of right breast and nipple: Secondary | ICD-10-CM | POA: Diagnosis not present

## 2018-12-05 MED FILL — CLOBETASOL PROPIONATE 0.05: 0.05 | 30 days supply | Qty: 50 | Fill #0

## 2018-12-05 MED FILL — PIMECROLIMUS 1 % CREA: 1 | 20 days supply | Qty: 30 | Fill #0

## 2018-12-07 DIAGNOSIS — M6281 Muscle weakness (generalized): Secondary | ICD-10-CM | POA: Diagnosis not present

## 2018-12-07 DIAGNOSIS — M25372 Other instability, left ankle: Secondary | ICD-10-CM | POA: Diagnosis not present

## 2018-12-07 DIAGNOSIS — M25651 Stiffness of right hip, not elsewhere classified: Secondary | ICD-10-CM | POA: Diagnosis not present

## 2018-12-07 MED FILL — OLOPATADINE HCL 0.2% EYE DR: 0.2 | 25 days supply | Qty: 3 | Fill #1

## 2018-12-08 DIAGNOSIS — M25372 Other instability, left ankle: Secondary | ICD-10-CM | POA: Diagnosis not present

## 2018-12-08 DIAGNOSIS — M6281 Muscle weakness (generalized): Secondary | ICD-10-CM | POA: Diagnosis not present

## 2018-12-08 DIAGNOSIS — M25651 Stiffness of right hip, not elsewhere classified: Secondary | ICD-10-CM | POA: Diagnosis not present

## 2018-12-08 DIAGNOSIS — M25671 Stiffness of right ankle, not elsewhere classified: Secondary | ICD-10-CM | POA: Diagnosis not present

## 2018-12-08 DIAGNOSIS — H30142 Acute posterior multifocal placoid pigment epitheliopathy, left eye: Secondary | ICD-10-CM | POA: Diagnosis not present

## 2018-12-19 ENCOUNTER — Telehealth: Payer: Self-pay | Admitting: Plastic Surgery

## 2018-12-19 MED FILL — DOXYCYCLINE HYC 50 MG CAP: 50 | 90 days supply | Qty: 90 | Fill #2

## 2018-12-19 MED FILL — GABAPENTIN 300 MG CAPSULE: 300 | 30 days supply | Qty: 90 | Fill #1

## 2018-12-19 NOTE — Telephone Encounter (Signed)
Patient called stating she has questions for Dr. Marla Roe and requesting call back upon earliest convenience

## 2018-12-20 DIAGNOSIS — M25671 Stiffness of right ankle, not elsewhere classified: Secondary | ICD-10-CM | POA: Diagnosis not present

## 2018-12-20 DIAGNOSIS — M25372 Other instability, left ankle: Secondary | ICD-10-CM | POA: Diagnosis not present

## 2018-12-20 DIAGNOSIS — H30142 Acute posterior multifocal placoid pigment epitheliopathy, left eye: Secondary | ICD-10-CM | POA: Diagnosis not present

## 2018-12-20 DIAGNOSIS — M6281 Muscle weakness (generalized): Secondary | ICD-10-CM | POA: Diagnosis not present

## 2018-12-20 DIAGNOSIS — M25651 Stiffness of right hip, not elsewhere classified: Secondary | ICD-10-CM | POA: Diagnosis not present

## 2018-12-20 NOTE — Telephone Encounter (Signed)
Call to pt- she reports that she is having increased pain from sternum to axilla- right breast- no pain on left side She also c/o right breast has dropped significantly - she also notes that the left breast is "lumpy" & asymetrical compared to the right I offered to see if she would like to come back in for further evaluation- she requests that I consult with Dr. Marla Roe- & together they make a determination for plan of care  [facility]

## 2018-12-23 DIAGNOSIS — Z9011 Acquired absence of right breast and nipple: Secondary | ICD-10-CM | POA: Diagnosis not present

## 2018-12-23 DIAGNOSIS — C50912 Malignant neoplasm of unspecified site of left female breast: Secondary | ICD-10-CM | POA: Diagnosis not present

## 2018-12-28 DIAGNOSIS — C50912 Malignant neoplasm of unspecified site of left female breast: Secondary | ICD-10-CM | POA: Diagnosis not present

## 2018-12-28 DIAGNOSIS — Z9011 Acquired absence of right breast and nipple: Secondary | ICD-10-CM | POA: Diagnosis not present

## 2018-12-29 DIAGNOSIS — M25651 Stiffness of right hip, not elsewhere classified: Secondary | ICD-10-CM | POA: Diagnosis not present

## 2018-12-29 DIAGNOSIS — H30142 Acute posterior multifocal placoid pigment epitheliopathy, left eye: Secondary | ICD-10-CM | POA: Diagnosis not present

## 2018-12-29 DIAGNOSIS — M6281 Muscle weakness (generalized): Secondary | ICD-10-CM | POA: Diagnosis not present

## 2018-12-29 DIAGNOSIS — M25372 Other instability, left ankle: Secondary | ICD-10-CM | POA: Diagnosis not present

## 2018-12-29 DIAGNOSIS — M25671 Stiffness of right ankle, not elsewhere classified: Secondary | ICD-10-CM | POA: Diagnosis not present

## 2019-01-02 DIAGNOSIS — M25671 Stiffness of right ankle, not elsewhere classified: Secondary | ICD-10-CM | POA: Diagnosis not present

## 2019-01-02 DIAGNOSIS — M25651 Stiffness of right hip, not elsewhere classified: Secondary | ICD-10-CM | POA: Diagnosis not present

## 2019-01-02 DIAGNOSIS — H30142 Acute posterior multifocal placoid pigment epitheliopathy, left eye: Secondary | ICD-10-CM | POA: Diagnosis not present

## 2019-01-02 DIAGNOSIS — M25372 Other instability, left ankle: Secondary | ICD-10-CM | POA: Diagnosis not present

## 2019-01-02 DIAGNOSIS — M6281 Muscle weakness (generalized): Secondary | ICD-10-CM | POA: Diagnosis not present

## 2019-01-04 DIAGNOSIS — M25671 Stiffness of right ankle, not elsewhere classified: Secondary | ICD-10-CM | POA: Diagnosis not present

## 2019-01-04 DIAGNOSIS — M65321 Trigger finger, right index finger: Secondary | ICD-10-CM | POA: Diagnosis not present

## 2019-01-04 DIAGNOSIS — M25651 Stiffness of right hip, not elsewhere classified: Secondary | ICD-10-CM | POA: Diagnosis not present

## 2019-01-04 DIAGNOSIS — H30142 Acute posterior multifocal placoid pigment epitheliopathy, left eye: Secondary | ICD-10-CM | POA: Diagnosis not present

## 2019-01-04 DIAGNOSIS — M65342 Trigger finger, left ring finger: Secondary | ICD-10-CM | POA: Diagnosis not present

## 2019-01-04 DIAGNOSIS — M25372 Other instability, left ankle: Secondary | ICD-10-CM | POA: Diagnosis not present

## 2019-01-04 DIAGNOSIS — M6281 Muscle weakness (generalized): Secondary | ICD-10-CM | POA: Diagnosis not present

## 2019-01-12 DIAGNOSIS — M25651 Stiffness of right hip, not elsewhere classified: Secondary | ICD-10-CM | POA: Diagnosis not present

## 2019-01-12 DIAGNOSIS — M25372 Other instability, left ankle: Secondary | ICD-10-CM | POA: Diagnosis not present

## 2019-01-12 DIAGNOSIS — M6281 Muscle weakness (generalized): Secondary | ICD-10-CM | POA: Diagnosis not present

## 2019-01-13 ENCOUNTER — Encounter: Payer: Self-pay | Admitting: Gastroenterology

## 2019-01-16 ENCOUNTER — Other Ambulatory Visit (INDEPENDENT_AMBULATORY_CARE_PROVIDER_SITE_OTHER): Payer: Self-pay | Admitting: Orthopaedic Surgery

## 2019-01-16 MED FILL — GABAPENTIN 300 MG CAPSULE: 300 | 30 days supply | Qty: 90 | Fill #0

## 2019-01-16 MED FILL — OLOPATADINE HCL 0.2% EYE DR: 0.2 | 25 days supply | Qty: 3 | Fill #2

## 2019-01-16 NOTE — Telephone Encounter (Signed)
Please advise. OK for refill? 

## 2019-01-17 DIAGNOSIS — M6281 Muscle weakness (generalized): Secondary | ICD-10-CM | POA: Diagnosis not present

## 2019-01-17 DIAGNOSIS — M25671 Stiffness of right ankle, not elsewhere classified: Secondary | ICD-10-CM | POA: Diagnosis not present

## 2019-01-17 DIAGNOSIS — H30142 Acute posterior multifocal placoid pigment epitheliopathy, left eye: Secondary | ICD-10-CM | POA: Diagnosis not present

## 2019-01-17 DIAGNOSIS — M25372 Other instability, left ankle: Secondary | ICD-10-CM | POA: Diagnosis not present

## 2019-01-17 DIAGNOSIS — M25651 Stiffness of right hip, not elsewhere classified: Secondary | ICD-10-CM | POA: Diagnosis not present

## 2019-01-17 MED FILL — CLOBETASOL PROPIONATE 0.05: 0.05 | 30 days supply | Qty: 50 | Fill #0

## 2019-01-19 DIAGNOSIS — R110 Nausea: Secondary | ICD-10-CM | POA: Diagnosis not present

## 2019-01-19 DIAGNOSIS — R1310 Dysphagia, unspecified: Secondary | ICD-10-CM | POA: Diagnosis not present

## 2019-01-19 DIAGNOSIS — Z1211 Encounter for screening for malignant neoplasm of colon: Secondary | ICD-10-CM | POA: Diagnosis not present

## 2019-01-19 DIAGNOSIS — M25372 Other instability, left ankle: Secondary | ICD-10-CM | POA: Diagnosis not present

## 2019-01-19 DIAGNOSIS — M6281 Muscle weakness (generalized): Secondary | ICD-10-CM | POA: Diagnosis not present

## 2019-01-19 DIAGNOSIS — K219 Gastro-esophageal reflux disease without esophagitis: Secondary | ICD-10-CM | POA: Diagnosis not present

## 2019-01-19 DIAGNOSIS — H30142 Acute posterior multifocal placoid pigment epitheliopathy, left eye: Secondary | ICD-10-CM | POA: Diagnosis not present

## 2019-01-19 DIAGNOSIS — M25651 Stiffness of right hip, not elsewhere classified: Secondary | ICD-10-CM | POA: Diagnosis not present

## 2019-01-19 DIAGNOSIS — M25671 Stiffness of right ankle, not elsewhere classified: Secondary | ICD-10-CM | POA: Diagnosis not present

## 2019-01-19 MED FILL — PEG-3350 AND ELECTROLYTES S: 236 | 1 days supply | Qty: 4000 | Fill #0

## 2019-01-20 ENCOUNTER — Other Ambulatory Visit: Payer: Self-pay | Admitting: Gastroenterology

## 2019-01-20 DIAGNOSIS — R1011 Right upper quadrant pain: Secondary | ICD-10-CM

## 2019-01-20 MED FILL — PANTOPRAZOLE SOD DR 40 MG T: 40 | 90 days supply | Qty: 90 | Fill #0

## 2019-01-24 DIAGNOSIS — M6281 Muscle weakness (generalized): Secondary | ICD-10-CM | POA: Diagnosis not present

## 2019-01-24 DIAGNOSIS — M25651 Stiffness of right hip, not elsewhere classified: Secondary | ICD-10-CM | POA: Diagnosis not present

## 2019-01-24 DIAGNOSIS — M25671 Stiffness of right ankle, not elsewhere classified: Secondary | ICD-10-CM | POA: Diagnosis not present

## 2019-01-24 DIAGNOSIS — H30142 Acute posterior multifocal placoid pigment epitheliopathy, left eye: Secondary | ICD-10-CM | POA: Diagnosis not present

## 2019-01-24 DIAGNOSIS — M25372 Other instability, left ankle: Secondary | ICD-10-CM | POA: Diagnosis not present

## 2019-01-25 ENCOUNTER — Telehealth: Payer: Self-pay | Admitting: Physician Assistant

## 2019-01-25 DIAGNOSIS — M25371 Other instability, right ankle: Secondary | ICD-10-CM | POA: Diagnosis not present

## 2019-01-25 DIAGNOSIS — M25372 Other instability, left ankle: Secondary | ICD-10-CM | POA: Diagnosis not present

## 2019-01-25 NOTE — Telephone Encounter (Signed)
Called patient

## 2019-01-25 NOTE — Telephone Encounter (Signed)
Pt came into the office wanting to talk to [place] and asked if she can have him call her.  Pt wouldn't tell me what she needed.

## 2019-01-25 NOTE — Telephone Encounter (Signed)
See below

## 2019-01-26 DIAGNOSIS — M62561 Muscle wasting and atrophy, not elsewhere classified, right lower leg: Secondary | ICD-10-CM | POA: Diagnosis not present

## 2019-01-26 DIAGNOSIS — M25372 Other instability, left ankle: Secondary | ICD-10-CM | POA: Diagnosis not present

## 2019-01-26 DIAGNOSIS — M129 Arthropathy, unspecified: Secondary | ICD-10-CM | POA: Diagnosis not present

## 2019-01-26 DIAGNOSIS — M62562 Muscle wasting and atrophy, not elsewhere classified, left lower leg: Secondary | ICD-10-CM | POA: Diagnosis not present

## 2019-01-26 DIAGNOSIS — M6281 Muscle weakness (generalized): Secondary | ICD-10-CM | POA: Diagnosis not present

## 2019-01-26 DIAGNOSIS — M25651 Stiffness of right hip, not elsewhere classified: Secondary | ICD-10-CM | POA: Diagnosis not present

## 2019-01-26 DIAGNOSIS — M5416 Radiculopathy, lumbar region: Secondary | ICD-10-CM | POA: Diagnosis not present

## 2019-01-27 DIAGNOSIS — L661 Lichen planopilaris: Secondary | ICD-10-CM | POA: Diagnosis not present

## 2019-01-27 DIAGNOSIS — Z79899 Other long term (current) drug therapy: Secondary | ICD-10-CM | POA: Diagnosis not present

## 2019-01-31 ENCOUNTER — Ambulatory Visit (INDEPENDENT_AMBULATORY_CARE_PROVIDER_SITE_OTHER): Payer: 59 | Admitting: Family Medicine

## 2019-01-31 ENCOUNTER — Encounter: Payer: Self-pay | Admitting: Family Medicine

## 2019-01-31 ENCOUNTER — Other Ambulatory Visit: Payer: Self-pay | Admitting: *Deleted

## 2019-01-31 ENCOUNTER — Other Ambulatory Visit: Payer: Self-pay | Admitting: Family Medicine

## 2019-01-31 ENCOUNTER — Ambulatory Visit
Admission: RE | Admit: 2019-01-31 | Discharge: 2019-01-31 | Disposition: A | Discharge status: Home / Self Care | Payer: 59 | Source: Ambulatory Visit | Attending: Family Medicine | Admitting: Family Medicine

## 2019-01-31 ENCOUNTER — Other Ambulatory Visit: Payer: Self-pay

## 2019-01-31 ENCOUNTER — Ambulatory Visit: Payer: Self-pay

## 2019-01-31 VITALS — BP 120/78 | Ht 66.0 in | Wt 150.0 lb

## 2019-01-31 DIAGNOSIS — M25562 Pain in left knee: Secondary | ICD-10-CM

## 2019-01-31 MED FILL — FOLIC ACID 1 MG TABS: 1 | 30 days supply | Qty: 60 | Fill #0

## 2019-01-31 MED FILL — METHOTREXATE SODIUM 2.5 MG: 2.5 | 28 days supply | Qty: 20 | Fill #2

## 2019-01-31 NOTE — Patient Instructions (Signed)
Your x-rays and ultrasound are reassuring. You have a knee contusion. Knee brace when up and walking around for support. Do simple motion exercises daily, try to do straight leg raises when tolerated. Icing 15 minutes at a time 3-4 times a day. Tylenol 500mg  1-2 tabs three times a day. Topical aspercreme up to 4 times a day. Salon pas patches may also help with pain. Use the walker when up and walking around also. Follow up with me in 1 week for reevaluation.

## 2019-01-31 NOTE — Progress Notes (Signed)
   HPI  CC: Left knee pain  Felicia Cain is a 63 year old female who presents for left knee pain.  She states that 3 days ago she fell.  She states she has a history of neuropathy as of late, which is being worked up by her Publishing rights manager.  She states she has several falls related to this.  She reports weakness of her legs.  Acutely, she fell tripping over her right leg.  She landed on her left knee.  She states since that time she had trouble extending or bending her knee.  She has been using a cane to ambulate.  She walks with a limp since that time.  She reports some swelling in the knee.  She reports no improvement.  She has been taking Tylenol as needed for pain relief.  She is not taking any NSAIDs due to her colonoscopy coming up.  She denies any numbness tingling in her leg.  She does report general weakness in lower extremity, which is not new.  She denies any recent fevers or chills.  See HPI and/or previous note for associated ROS.  Objective: BP 120/78   Ht 5\' 6"  (1.676 m)   Wt 150 lb (68 kg)   BMI 24.21 kg/m  Gen: Right-Hand Dominant. NAD, well groomed, a/o x3, normal affect.  CV: Well-perfused. Warm.  Resp: Non-labored.  Neuro: Sensation intact throughout. No gross coordination deficits.  Gait: Nonpathologic posture, ambulating with a limp, with a cane.  Left knee exam: No erythema or warmth.  Mild swelling on patellar ballottement.  Tenderness palpation over the patellar tendon at the inferior edge of the patella.  Range of motion with around a 5 degree lag in extension.  She can get to around 90 degrees in flexion.  Pain with both these movements.  Strength 4+ out of 5 in knee extension.  Strength 5-/5 knee flexion.  Negative ligamentous testing.  Negative McMurray test.  Right knee exam: No erythema, warmth, swelling.  No tenderness palpation on exam.  Full range of motion throughout all exam.  Strength 4+ out of 5 in knee extension.  Strength 5 out of 5 in knee flexion.  Negative  ligamentous testing.  Negative McMurray test.  ULTRASOUND: Knee, left Diagnostic limited ultrasound imaging obtained of patient's left knee.  - Quadriceps tendon: No appreciated signs of tearing, edema, or calcification. No (compressible) fluid/edema noted within the suprapatellar pouch.  - Patellar tendon: No appreciated signs of tearing, edema, or calcification. No infrapatellar or tibial tuberosity fluid or abnormality appreciated.  - Medial joint line: No signs concerning for meniscal pathology appreciated. No increased fluid presence noted. No evidence of osteophyte development or significant joint space loss.  - Lateral joint line: No signs concerning for meniscal pathology appreciated. No increased fluid presence noted. No evidence of osteophyte development or significant joint space loss.  IMPRESSION: findings consistent with normal knee.   Assessment and plan: Left knee pain, likely secondary to contusion to patellar tendon and bony structures following fall on 01/28/2019.  We discussed treatment options at today's visit.  I believe she would greatly benefit from rest and observation.  She can wear a knee sleeve as needed.  We also advised use a walker to help get around, due to the neuropathy she is experiencing and being worked up for by her neurosurgeon.  We will see her back for follow-up in 1 week.    Lewanda Rife, MD [facility] Fellow 01/31/2019 4:14 PM

## 2019-02-01 ENCOUNTER — Ambulatory Visit (HOSPITAL_COMMUNITY)
Admission: RE | Admit: 2019-02-01 | Discharge: 2019-02-01 | Disposition: A | Discharge status: Home / Self Care | Payer: 59 | Source: Ambulatory Visit | Attending: Gastroenterology | Admitting: Gastroenterology

## 2019-02-01 ENCOUNTER — Encounter (HOSPITAL_COMMUNITY)
Admission: RE | Admit: 2019-02-01 | Discharge: 2019-02-01 | Disposition: A | Discharge status: Home / Self Care | Payer: 59 | Source: Ambulatory Visit | Attending: Gastroenterology | Admitting: Gastroenterology

## 2019-02-01 DIAGNOSIS — R110 Nausea: Secondary | ICD-10-CM | POA: Diagnosis not present

## 2019-02-01 DIAGNOSIS — M65321 Trigger finger, right index finger: Secondary | ICD-10-CM | POA: Diagnosis not present

## 2019-02-01 DIAGNOSIS — R1011 Right upper quadrant pain: Secondary | ICD-10-CM | POA: Diagnosis not present

## 2019-02-01 DIAGNOSIS — M65342 Trigger finger, left ring finger: Secondary | ICD-10-CM | POA: Diagnosis not present

## 2019-02-01 DIAGNOSIS — K219 Gastro-esophageal reflux disease without esophagitis: Secondary | ICD-10-CM | POA: Diagnosis not present

## 2019-02-01 DIAGNOSIS — C50912 Malignant neoplasm of unspecified site of left female breast: Secondary | ICD-10-CM | POA: Diagnosis not present

## 2019-02-01 DIAGNOSIS — C50911 Malignant neoplasm of unspecified site of right female breast: Secondary | ICD-10-CM | POA: Diagnosis not present

## 2019-02-01 MED ORDER — TECHNETIUM TC 99M MEBROFENIN IV KIT
5.3000 | PACK | Freq: Once | INTRAVENOUS | Status: AC | PRN
  Administered 2019-02-01: 10:00:00 5.3 via INTRAVENOUS

## 2019-02-02 DIAGNOSIS — H30142 Acute posterior multifocal placoid pigment epitheliopathy, left eye: Secondary | ICD-10-CM | POA: Diagnosis not present

## 2019-02-02 DIAGNOSIS — M25671 Stiffness of right ankle, not elsewhere classified: Secondary | ICD-10-CM | POA: Diagnosis not present

## 2019-02-02 DIAGNOSIS — M25651 Stiffness of right hip, not elsewhere classified: Secondary | ICD-10-CM | POA: Diagnosis not present

## 2019-02-02 DIAGNOSIS — M25372 Other instability, left ankle: Secondary | ICD-10-CM | POA: Diagnosis not present

## 2019-02-02 DIAGNOSIS — M6281 Muscle weakness (generalized): Secondary | ICD-10-CM | POA: Diagnosis not present

## 2019-02-04 ENCOUNTER — Encounter: Payer: Self-pay | Admitting: Family Medicine

## 2019-02-06 DIAGNOSIS — R1310 Dysphagia, unspecified: Secondary | ICD-10-CM | POA: Diagnosis not present

## 2019-02-06 DIAGNOSIS — K5730 Diverticulosis of large intestine without perforation or abscess without bleeding: Secondary | ICD-10-CM | POA: Diagnosis not present

## 2019-02-06 DIAGNOSIS — R110 Nausea: Secondary | ICD-10-CM | POA: Diagnosis not present

## 2019-02-06 DIAGNOSIS — K219 Gastro-esophageal reflux disease without esophagitis: Secondary | ICD-10-CM | POA: Diagnosis not present

## 2019-02-06 DIAGNOSIS — K2970 Gastritis, unspecified, without bleeding: Secondary | ICD-10-CM | POA: Diagnosis not present

## 2019-02-06 DIAGNOSIS — Z1211 Encounter for screening for malignant neoplasm of colon: Secondary | ICD-10-CM | POA: Diagnosis not present

## 2019-02-07 DIAGNOSIS — H16212 Exposure keratoconjunctivitis, left eye: Secondary | ICD-10-CM | POA: Diagnosis not present

## 2019-02-07 DIAGNOSIS — H11823 Conjunctivochalasis, bilateral: Secondary | ICD-10-CM | POA: Diagnosis not present

## 2019-02-07 DIAGNOSIS — G4733 Obstructive sleep apnea (adult) (pediatric): Secondary | ICD-10-CM | POA: Diagnosis not present

## 2019-02-09 ENCOUNTER — Other Ambulatory Visit: Payer: Self-pay

## 2019-02-09 ENCOUNTER — Ambulatory Visit (INDEPENDENT_AMBULATORY_CARE_PROVIDER_SITE_OTHER): Payer: 59 | Admitting: Sports Medicine

## 2019-02-09 VITALS — BP 100/60

## 2019-02-09 DIAGNOSIS — M25562 Pain in left knee: Secondary | ICD-10-CM | POA: Diagnosis not present

## 2019-02-09 DIAGNOSIS — S8002XS Contusion of left knee, sequela: Secondary | ICD-10-CM

## 2019-02-09 NOTE — Progress Notes (Signed)
Subjective:    Felicia Cain - 63 y.o. female MRN [id number]  Date of birth: October 17, 1955  CC:  Felicia Cain is here for follow-up of left knee pain.  HPI: Patient reports that since her last appointment, her bruising and pain of her left knee have both improved, but she still experiences pain especially when extending her left leg.  She says that it will often "catch" when extending.  She often feels a sense of instability when she puts weight on it.  She has been doing the hip flexion exercises were prescribed and has been wearing the knee brace provided at her last visit for part of the each day.  Soc Hx Patient worked as a Chiropractor to [place] Health Maintenance:  Health Maintenance Due  Topic Date Due  . Hepatitis C Screening  October 19, 1955  . TETANUS/TDAP  09/14/1974  . MAMMOGRAM  09/14/2005  . PAP SMEAR-Modifier  12/09/2013  . COLONOSCOPY  12/22/2018    -  reports that she has never smoked. She has never used smokeless tobacco. - Review of Systems: Per HPI. - Past Medical History: Patient Active Problem List   Diagnosis Date Noted  . Patellar contusion, left, sequela 02/09/2019  . Rib pain on right side 11/24/2018  . Acquired absence of both breasts 09/20/2018  . Ruptured right breast implant 08/26/2018  . History of reconstruction of both breasts 07/08/2018  . Breast implant capsular contracture 07/08/2018  . Breast pain 07/08/2018  . 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 Status post-operative repair of closed fracture of right hip 09/08/2017  . Closed right hip fracture, sequela 08/03/2017  . Acute pain of right knee 05/24/2017  . Hip fracture ([place]) 01/25/2017  . Closed left hip fracture ([place]) 01/25/2017  . Closed hip fracture, right, initial encounter ([place])   . Preop examination   . Fibromyalgia   . Neuropathy   . Corneal abrasion   . Nausea   . Fall   . SIRS (systemic inflammatory response syndrome)  ([place]) 07/19/2015  . Seronegative myasthenia gravis ([place]) 07/19/2015  . Acute exacerbation of chronic low back pain 07/19/2015  . Leukocytosis 07/19/2015  . Myasthenia gravis in remission ([place]) 10/23/2014  . OSA on CPAP 10/23/2014  . Hypersomnia, persistent 10/23/2014  . Neurogenic hypoventilation 08/14/2014  . Hypoxemia 08/14/2014  . Myasthenia gravis with exacerbation ([place]) 08/14/2014  . Myasthenia gravis with exacerbation, ocular ([place]) 12/08/2012  . Right shoulder pain 08/02/2012  . 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  . History of optic neuritis 06/27/2012  . Rosacea 06/27/2012  . S/P laminectomy 06/27/2012  . 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 S/P bilateral mastectomy 06/26/2012  . BRCA negative 06/26/2012  . GERD (gastroesophageal reflux disease) 06/26/2012  . Menopause 06/26/2012  . Ankle pain 06/02/2012   - Medications: reviewed and updated   Objective:   Physical Exam BP 100/60  Gen: NAD, alert, cooperative with exam, well-appearing Musculoskeletal: Left knee: Mild swelling visualized on the superomedial aspect of the knee. FROM with 4/5 strength. Tender to palpation on medial side of patella Normal Lachman and drawer tests, valgus and varus stress tests, McMurray test, some pain on Thessaly test. NVI distally. Right knee: No deformity. FROM with 4/5 strength. No tenderness to palpation. NVI distally.          Assessment & Plan:   Patellar contusion, left, sequela Patient has sustained a contusion to her patellofemoral cartilage, which is causing her pain and catching  sensation.  She was reassured that this will heal with time, although it will likely take at least 4 to 6 weeks.  She was encouraged to continue exercising as tolerated and to let us know if she has not noted significant improvement in this timeframe.    Maia Breslow, M.D. 02/09/2019, 4:41 PM PGY-2, [place]

## 2019-02-09 NOTE — Assessment & Plan Note (Signed)
Patient has sustained a contusion to her patellofemoral cartilage, which is causing her pain and catching sensation.  She was reassured that this will heal with time, although it will likely take at least 4 to 6 weeks.  She was encouraged to continue exercising as tolerated and to let us know if she has not noted significant improvement in this timeframe.

## 2019-02-10 DIAGNOSIS — M25651 Stiffness of right hip, not elsewhere classified: Secondary | ICD-10-CM | POA: Diagnosis not present

## 2019-02-10 DIAGNOSIS — M25372 Other instability, left ankle: Secondary | ICD-10-CM | POA: Diagnosis not present

## 2019-02-10 DIAGNOSIS — M6281 Muscle weakness (generalized): Secondary | ICD-10-CM | POA: Diagnosis not present

## 2019-02-14 DIAGNOSIS — M25671 Stiffness of right ankle, not elsewhere classified: Secondary | ICD-10-CM | POA: Diagnosis not present

## 2019-02-14 DIAGNOSIS — H30142 Acute posterior multifocal placoid pigment epitheliopathy, left eye: Secondary | ICD-10-CM | POA: Diagnosis not present

## 2019-02-14 DIAGNOSIS — M6281 Muscle weakness (generalized): Secondary | ICD-10-CM | POA: Diagnosis not present

## 2019-02-14 DIAGNOSIS — M25372 Other instability, left ankle: Secondary | ICD-10-CM | POA: Diagnosis not present

## 2019-02-14 DIAGNOSIS — L661 Lichen planopilaris: Secondary | ICD-10-CM | POA: Diagnosis not present

## 2019-02-14 DIAGNOSIS — Z79899 Other long term (current) drug therapy: Secondary | ICD-10-CM | POA: Diagnosis not present

## 2019-02-14 DIAGNOSIS — M25651 Stiffness of right hip, not elsewhere classified: Secondary | ICD-10-CM | POA: Diagnosis not present

## 2019-02-15 DIAGNOSIS — C50912 Malignant neoplasm of unspecified site of left female breast: Secondary | ICD-10-CM | POA: Diagnosis not present

## 2019-02-15 DIAGNOSIS — Z9011 Acquired absence of right breast and nipple: Secondary | ICD-10-CM | POA: Diagnosis not present

## 2019-02-16 ENCOUNTER — Encounter: Payer: Self-pay | Admitting: Neurology

## 2019-02-16 ENCOUNTER — Other Ambulatory Visit: Payer: Self-pay

## 2019-02-16 ENCOUNTER — Ambulatory Visit (INDEPENDENT_AMBULATORY_CARE_PROVIDER_SITE_OTHER): Payer: 59 | Admitting: Neurology

## 2019-02-16 DIAGNOSIS — G959 Disease of spinal cord, unspecified: Secondary | ICD-10-CM | POA: Diagnosis not present

## 2019-02-16 DIAGNOSIS — R2681 Unsteadiness on feet: Secondary | ICD-10-CM

## 2019-02-16 DIAGNOSIS — G7001 Myasthenia gravis with (acute) exacerbation: Secondary | ICD-10-CM | POA: Diagnosis not present

## 2019-02-16 DIAGNOSIS — M6281 Muscle weakness (generalized): Secondary | ICD-10-CM | POA: Diagnosis not present

## 2019-02-16 DIAGNOSIS — C801 Malignant (primary) neoplasm, unspecified: Secondary | ICD-10-CM | POA: Diagnosis not present

## 2019-02-16 DIAGNOSIS — Z853 Personal history of malignant neoplasm of breast: Secondary | ICD-10-CM | POA: Diagnosis not present

## 2019-02-16 DIAGNOSIS — R413 Other amnesia: Secondary | ICD-10-CM | POA: Diagnosis not present

## 2019-02-16 DIAGNOSIS — G992 Myelopathy in diseases classified elsewhere: Secondary | ICD-10-CM | POA: Diagnosis not present

## 2019-02-16 DIAGNOSIS — M25372 Other instability, left ankle: Secondary | ICD-10-CM | POA: Diagnosis not present

## 2019-02-16 DIAGNOSIS — M25671 Stiffness of right ankle, not elsewhere classified: Secondary | ICD-10-CM | POA: Diagnosis not present

## 2019-02-16 DIAGNOSIS — G3184 Mild cognitive impairment, so stated: Secondary | ICD-10-CM

## 2019-02-16 DIAGNOSIS — H30142 Acute posterior multifocal placoid pigment epitheliopathy, left eye: Secondary | ICD-10-CM | POA: Diagnosis not present

## 2019-02-16 DIAGNOSIS — M25651 Stiffness of right hip, not elsewhere classified: Secondary | ICD-10-CM | POA: Diagnosis not present
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
## 2019-02-16 NOTE — Patient Instructions (Signed)
Myasthenia Gravis  Myasthenia gravis (MG) is a long-term (chronic) condition that causes weakness in the muscles you can control (voluntary muscles). MG can affect any voluntary muscle. The muscles most often affected are the ones that control:   Eye movement.   Facial movements.   Swallowing.  MG is a disease in which the body's disease-fighting system (immune system) attacks its own healthy tissues (autoimmune disease). When you have MG, your immune system makes proteins (antibodies) that block the chemical (acetylcholine) that your body needs to send nerve signals to your muscles. This causes muscle weakness.  What are the causes?  The exact cause of MG is not known.  What increases the risk?  The following factors may make you more likely to develop this condition:   Having an enlarged thymus gland. The thymus gland is located under the breastbone. It makes certain cells for the immune system.   Having a family history of MG.  What are the signs or symptoms?  Symptoms of MG may include:   Drooping eyelids.   Double vision.   Muscle weakness that gets worse with activity and gets better after rest.   Difficulty walking.   Trouble chewing and swallowing.   Trouble making facial expressions.   Slurred speech.   Weakness of the arms, hands, and legs.  Sudden, severe difficulty breathing (myasthenic crisis) may develop after having:   An infection.   A fever.   A bad reaction to a medicine.  Myasthenic crisis requires emergency breathing support.  Sometimes symptoms of MG go away for a while (remission) and then come back later.  How is this diagnosed?  This condition may be diagnosed based on:   Your symptoms and medical history.   A physical exam.   Blood tests.   Tests of your muscle strength and function.   Imaging tests, such as a CT scan or an MRI.  How is this treated?  The goal of treatment is to improve muscle strength. Treatment may include:   Taking medicine.   Making lifestyle  changes that focus on saving your energy.   Doing physical therapy to gain strength.   Having surgery to remove the thymus gland (thymectomy). This may result in a long remission for some people.   Having a procedure to remove the acetylcholine antibodies (plasmapheresis).   Getting emergency breathing support, if you experience myasthenic crisis.  If you experience remission, you may be able to stop treatment and then resume treatment when your symptoms return.  Follow these instructions at home:     Take over-the-counter and prescription medicines only as told by your health care provider.   Get plenty of rest and sleep. Take frequent breaks to rest your eyes, especially when in bright light or working on a computer.   Maintain a healthy diet and a healthy weight. Work with your health care provider or a diet and nutrition specialist (dietitian) if you need help.   Do exercises as told by your health care provider or physical therapist.   Do not use any products that contain nicotine or tobacco, such as cigarettes and e-cigarettes. If you need help quitting, ask your health care provider.   Prevent infections by:  ? Washing your hands often with soap and water. If soap and water are not available, use hand sanitizer.  ? Avoiding contact with other people who are sick.  ? Avoiding touching your eyes, nose, and mouth.  ? Cleaning surfaces in your home that are touched   often using a disinfectant.   Keep all follow-up visits as told by your health care provider. This is important.  Contact a health care provider if:   Your symptoms change or get worse, especially after having a fever or infection.  Get help right away if:   You have trouble breathing.  Summary   Myasthenia gravis (MG) is a long-term (chronic) condition that causes weakness in the muscles you can control (voluntary muscles).   A symptom of MG is muscle weakness that gets worse with activity and gets better after rest.   Sudden, severe  difficulty breathing (myasthenic crisis) may develop after having an infection, a fever, or a bad reaction to a medicine.   The goal of treatment is to improve muscle strength. Treatment may include medicines, lifestyle changes, physical therapy, surgery, plasmapheresis, or emergency breathing support.  This information is not intended to replace advice given to you by your health care provider. Make sure you discuss any questions you have with your health care provider.  Document Released: 11/30/2000 Document Revised: 09/06/2017 Document Reviewed: 09/06/2017  Elsevier Interactive Patient Education  2019 Elsevier Inc.

## 2019-02-18 ENCOUNTER — Encounter: Payer: Self-pay | Admitting: Neurology

## 2019-02-20 ENCOUNTER — Telehealth: Payer: Self-pay | Admitting: Neurology

## 2019-02-20 NOTE — Telephone Encounter (Signed)
I spoke to the patient and scheduled her MRI's for 02/21/19 at [facility].  She also stated that she would either like to talk to [facility] or Dr. Brett Fairy.

## 2019-02-20 NOTE — Telephone Encounter (Signed)
I had routed the questions that she had sent through Mychart to Dr Brett Fairy.

## 2019-02-20 NOTE — Telephone Encounter (Signed)
no to the covid-19 questions MR Brain w/wo contrast, MR Cervical spine w/wo contrast & MR Thoracic spine w/wo contrast Dr. Lorenza Cambridge UMR Auth: [place] Ref # [id number]. Patient is scheduled at [facility] for 02/21/19

## 2019-02-21 ENCOUNTER — Ambulatory Visit (INDEPENDENT_AMBULATORY_CARE_PROVIDER_SITE_OTHER): Payer: 59

## 2019-02-21 ENCOUNTER — Telehealth: Payer: Self-pay | Admitting: Neurology

## 2019-02-21 ENCOUNTER — Other Ambulatory Visit: Payer: Self-pay

## 2019-02-21 DIAGNOSIS — Z9989 Dependence on other enabling machines and devices: Secondary | ICD-10-CM

## 2019-02-21 DIAGNOSIS — G7001 Myasthenia gravis with (acute) exacerbation: Secondary | ICD-10-CM | POA: Diagnosis not present

## 2019-02-21 DIAGNOSIS — M25671 Stiffness of right ankle, not elsewhere classified: Secondary | ICD-10-CM | POA: Diagnosis not present

## 2019-02-21 DIAGNOSIS — M25372 Other instability, left ankle: Secondary | ICD-10-CM | POA: Diagnosis not present

## 2019-02-21 DIAGNOSIS — M6281 Muscle weakness (generalized): Secondary | ICD-10-CM | POA: Diagnosis not present

## 2019-02-21 DIAGNOSIS — R413 Other amnesia: Secondary | ICD-10-CM | POA: Diagnosis not present

## 2019-02-21 DIAGNOSIS — G4733 Obstructive sleep apnea (adult) (pediatric): Secondary | ICD-10-CM

## 2019-02-21 DIAGNOSIS — G992 Myelopathy in diseases classified elsewhere: Secondary | ICD-10-CM

## 2019-02-21 DIAGNOSIS — G7000 Myasthenia gravis without (acute) exacerbation: Secondary | ICD-10-CM | POA: Diagnosis not present

## 2019-02-21 DIAGNOSIS — Z853 Personal history of malignant neoplasm of breast: Secondary | ICD-10-CM

## 2019-02-21 DIAGNOSIS — H30142 Acute posterior multifocal placoid pigment epitheliopathy, left eye: Secondary | ICD-10-CM | POA: Diagnosis not present

## 2019-02-21 DIAGNOSIS — G959 Disease of spinal cord, unspecified: Secondary | ICD-10-CM | POA: Diagnosis not present

## 2019-02-21 DIAGNOSIS — G3184 Mild cognitive impairment, so stated: Secondary | ICD-10-CM

## 2019-02-21 DIAGNOSIS — C801 Malignant (primary) neoplasm, unspecified: Secondary | ICD-10-CM

## 2019-02-21 DIAGNOSIS — M25651 Stiffness of right hip, not elsewhere classified: Secondary | ICD-10-CM | POA: Diagnosis not present

## 2019-02-21 MED ORDER — GADOBENATE DIMEGLUMINE 529 MG/ML IV SOLN
14.0000 mL | Freq: Once | INTRAVENOUS | Status: AC | PRN
  Administered 2019-02-21: 14 mL via INTRAVENOUS

## 2019-02-21 NOTE — Telephone Encounter (Signed)
Order and referral for PT placed for the patient

## 2019-02-21 NOTE — Addendum Note (Signed)
Addended by: Darleen Crocker on: 02/21/2019 04:34 PM   Modules accepted: Orders

## 2019-02-21 NOTE — Telephone Encounter (Signed)
Pt presented today for her MRI's and she had some concerns related to her CPAP. She states that she is using her machine and it is showing her apnea events. She is averaging AHI of 6 on her CPAP through phillips machine, which she had gotten through [email or web address]. This auto CPAP 5-12 cm water pressure. She started this machine in aug 2019. This has increased from 4.2 times a hr back in Jan. The patient is curious about the pressure if it needs adjusting. If so a order would be needed to send to [email or web address].   The patient also mentioned a Referral for PT was discussed previously last visit but it doesn't appeared to be entered. Advised I would confirm thi with Dr Brett Fairy and if she doesn't want that I would enter for her. She prefers to use the PT office through [facility] and their phone number is [phone]. Advised the patient I will reach out to her with what Dr Brett Fairy states and let her know what we will do. She verbalized understanding and was appreciative.

## 2019-02-21 NOTE — Telephone Encounter (Signed)
I would place the pressure settings at 6 through 14 with 3 cm EPPR.

## 2019-02-22 ENCOUNTER — Encounter: Payer: Self-pay | Admitting: Neurology

## 2019-02-22 ENCOUNTER — Other Ambulatory Visit: Payer: 59

## 2019-02-23 DIAGNOSIS — M62830 Muscle spasm of back: Secondary | ICD-10-CM | POA: Diagnosis not present

## 2019-02-23 DIAGNOSIS — G7001 Myasthenia gravis with (acute) exacerbation: Secondary | ICD-10-CM | POA: Diagnosis not present

## 2019-02-23 DIAGNOSIS — M6281 Muscle weakness (generalized): Secondary | ICD-10-CM | POA: Diagnosis not present

## 2019-02-23 DIAGNOSIS — M25671 Stiffness of right ankle, not elsewhere classified: Secondary | ICD-10-CM | POA: Diagnosis not present

## 2019-02-23 DIAGNOSIS — H30142 Acute posterior multifocal placoid pigment epitheliopathy, left eye: Secondary | ICD-10-CM | POA: Diagnosis not present

## 2019-02-23 DIAGNOSIS — M25372 Other instability, left ankle: Secondary | ICD-10-CM | POA: Diagnosis not present

## 2019-02-23 DIAGNOSIS — M62838 Other muscle spasm: Secondary | ICD-10-CM | POA: Diagnosis not present

## 2019-02-27 ENCOUNTER — Encounter: Payer: Self-pay | Admitting: Neurology

## 2019-02-28 DIAGNOSIS — M62838 Other muscle spasm: Secondary | ICD-10-CM | POA: Diagnosis not present

## 2019-02-28 DIAGNOSIS — M62830 Muscle spasm of back: Secondary | ICD-10-CM | POA: Diagnosis not present

## 2019-02-28 DIAGNOSIS — M6281 Muscle weakness (generalized): Secondary | ICD-10-CM | POA: Diagnosis not present

## 2019-02-28 DIAGNOSIS — M25671 Stiffness of right ankle, not elsewhere classified: Secondary | ICD-10-CM | POA: Diagnosis not present

## 2019-02-28 DIAGNOSIS — H30142 Acute posterior multifocal placoid pigment epitheliopathy, left eye: Secondary | ICD-10-CM | POA: Diagnosis not present

## 2019-02-28 DIAGNOSIS — M25372 Other instability, left ankle: Secondary | ICD-10-CM | POA: Diagnosis not present

## 2019-02-28 DIAGNOSIS — G7001 Myasthenia gravis with (acute) exacerbation: Secondary | ICD-10-CM | POA: Diagnosis not present

## 2019-03-06 ENCOUNTER — Other Ambulatory Visit: Payer: Self-pay | Admitting: Neurology

## 2019-03-06 ENCOUNTER — Ambulatory Visit
Admission: RE | Admit: 2019-03-06 | Discharge: 2019-03-06 | Disposition: A | Discharge status: Home / Self Care | Payer: 59 | Source: Ambulatory Visit | Attending: Sports Medicine | Admitting: Sports Medicine

## 2019-03-06 ENCOUNTER — Encounter: Payer: Self-pay | Admitting: Sports Medicine

## 2019-03-06 ENCOUNTER — Ambulatory Visit (INDEPENDENT_AMBULATORY_CARE_PROVIDER_SITE_OTHER): Payer: 59 | Admitting: Sports Medicine

## 2019-03-06 ENCOUNTER — Other Ambulatory Visit: Payer: Self-pay

## 2019-03-06 VITALS — BP 120/70 | Ht 66.0 in | Wt 150.0 lb

## 2019-03-06 DIAGNOSIS — M25522 Pain in left elbow: Secondary | ICD-10-CM

## 2019-03-06 DIAGNOSIS — S59902A Unspecified injury of left elbow, initial encounter: Secondary | ICD-10-CM | POA: Diagnosis not present

## 2019-03-06 DIAGNOSIS — M25562 Pain in left knee: Secondary | ICD-10-CM | POA: Diagnosis not present

## 2019-03-06 MED FILL — GABAPENTIN 300 MG CAPSULE: 300 | 30 days supply | Qty: 90 | Fill #1

## 2019-03-06 MED FILL — FOLIC ACID 1 MG TABS: 1 | 30 days supply | Qty: 60 | Fill #0

## 2019-03-07 ENCOUNTER — Other Ambulatory Visit: Payer: Self-pay | Admitting: Neurology

## 2019-03-07 MED FILL — OLOPATADINE HCL 0.2% EYE DR: 0.2 | 25 days supply | Qty: 3 | Fill #3

## 2019-03-07 MED FILL — DESVENLAFAXINE SUC ER 50 MG: 50 | 90 days supply | Qty: 90 | Fill #0

## 2019-03-07 NOTE — Progress Notes (Signed)
   Subjective:    Patient ID: Felicia Cain, female    DOB: 1955-11-16, 63 y.o.   MRN: [id number]  HPI   Patient comes in today with persistent left knee pain.  She suffered an injury on May 23.  She has been treated with physical therapy but continues to have problems.  Main complaint is locking and catching of the knee.  In fact, her knee locked on her 4 days ago causing her to fall and injured her left elbow.  She not noticed any swelling but did notice some deformity along the medial elbow.  She has difficulty extending the elbow.  She is right-hand dominant.   Review of Systems    As above Objective:   Physical Exam  Well-developed, well-nourished.  No acute distress.  Awake alert and oriented x3.  Vital signs reviewed.  Left elbow: Patient lacks about 10 degrees of extension.  Full flexion.  Good pronation and supination.  She is diffusely tender along medial and lateral elbow.  Elbow is stable to ligamentous exam.  There is a small area of soft tissue swelling along the medial elbow.  Good pulses distally.  Left knee: Full range of motion.  No effusion.  She is tender to palpation along the medial joint line with a positive Thessaly's.  No tenderness along lateral joint line.  Knee is grossly stable ligamentous exam.  Neurovascularly intact distally.  Walking with a limp.  X-ray of the left knee from Jan 31, 2019 is unremarkable.  I also reviewed bedside MSK ultrasound done around that same time which was also unremarkable. X-ray of the left elbow including AP, lateral, and radial head views show no obvious fracture.  No joint effusion.     Assessment & Plan:   Left elbow pain secondary to contusion Left knee pain-rule out medial meniscal tear  Reassurance regarding her left elbow pain.  I encouraged active range of motion and resumption of normal activity as tolerated.  For her left knee, her injury is now 3 weeks old.  X-rays and ultrasound are unremarkable.  She may have a  meniscal tear which is causing her knee to lock up.  We will get an MRI specifically to rule out a medial meniscal tear and I will call her with those findings when available.  We will delineate further treatment based on those findings.  This note was dictated using Dragon naturally speaking software and may contain errors in syntax, spelling, or content which have not been identified prior to signing this note.

## 2019-03-08 ENCOUNTER — Telehealth: Payer: Self-pay | Admitting: Neurology

## 2019-03-08 NOTE — Telephone Encounter (Signed)
Called the pt and made her aware the MRI reports were up. Pt states that she has reviewed them and had no questions.

## 2019-03-08 NOTE — Telephone Encounter (Signed)
-----   Message from Larey Seat, MD sent at 02/24/2019 12:19 PM EDT ----- IMPRESSION: Slightly abnormal MRI scan cervical spine showing mild spondylitic changes most prominent at C5-6 and C6-7 where there is mild canal narrowing but no significant compression.     INTERPRETING PHYSICIAN:  Antony Contras, MD

## 2019-03-09 ENCOUNTER — Ambulatory Visit (INDEPENDENT_AMBULATORY_CARE_PROVIDER_SITE_OTHER): Payer: 59 | Admitting: Neurology

## 2019-03-09 ENCOUNTER — Other Ambulatory Visit: Payer: Self-pay

## 2019-03-09 DIAGNOSIS — G5732 Lesion of lateral popliteal nerve, left lower limb: Secondary | ICD-10-CM

## 2019-03-09 DIAGNOSIS — M545 Low back pain, unspecified: Secondary | ICD-10-CM

## 2019-03-09 DIAGNOSIS — R29898 Other symptoms and signs involving the musculoskeletal system: Secondary | ICD-10-CM | POA: Diagnosis not present

## 2019-03-09 DIAGNOSIS — Z0289 Encounter for other administrative examinations: Secondary | ICD-10-CM

## 2019-03-09 NOTE — Progress Notes (Signed)
See procedure note.

## 2019-03-09 NOTE — Progress Notes (Signed)
History: I reviewed notes from Dr. Melven Sartorius notes at [facility] neurosurgery.  Patient noting progressive worsening of weakness and atrophy of both her legs, worse on the right.  She was advised to have an EMG nerve conduction study from physician at [facility] of her right leg because she is complaining of weakness and muscle loss in both legs Dr. Vertell Limber recommended assessment of both lower extremities.  Had a discussion with patient, she had a hip fracture in 1224 and had complicated course, her leg was positioned wrong, 6 months was unable to walk, a year recovery with a cane, she never fully recovered and the right leg remains weak. Last fall was 6 weeks ago and injured her knee. Weakness R>L leg, no weight loss or atrophy but the circumference of the right gastrocnemius decreased as opposed to the left. Dry needling helped a little. She had back surgery 20 years ago with chronic back pain. She is followed by Dr. Vertell Limber and has had MRI lumbar spine in 2018 as well as MR Brain, cervical and Thoracic by my colleague Dr. Brett Fairy. She would like the report sent to Dr. Lucia Gaskins at [facility], Dr. Brett Fairy at [facility] and Dr. Vertell Limber at [facility]. Reviewed with patient, she has a slight slowing of the peroneal nerve across the left peroneal head, she reports she crosses her legs often and compresses that area which is likely the cause. I recommended stopping crossing of legs and monitoring clinically. Otherwise emg/ncs is normal.  Discussed findings with patient. 15 minutes not including emg/ncs.   Cc:Dr. Lucia Gaskins at [facility], Dr. Brett Fairy at [facility] and Dr. Vertell Limber at [facility] total of 15 minutes was spent face-to-face with this patient. Over half this time was spent on counseling patient on the  1. 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  This does not include time spent on emg/ncs.

## 2019-03-12 NOTE — Progress Notes (Signed)
Full Name: Dr. York Grice Gender: Female MRN #: [id number] Date of Birth: [birth year]    Visit Date: 03/09/2019 07:56 Age: 63 Years 25 Months Old Examining Physician: Sarina Ill, MD  Referring Physician: [name], MD  History: Patient noting progressive weakness and atrophy of both her legs, worse on the right.  Summary: EMG/NCS was performed on the bilateral lower extremities.The left peroneal motor nerve showed decreased conduction velocity(Pop fossa-Fib head, 66m/s, N>44).All remaining nerves (as indicated in the following tables) were within normal limits.  All muscles (as indicated in the following tables) were within normal limits.      Conclusion:  1. Nerve conduction studies showed a mild slowing of the peroneal nerve across the left fibular head. Patient gives clinical history of crossing her legs often in a pattern that would compress this nerve which is the likely cause. 2. All remaining nerve conductions were normal. All muscles tested on EMG were within normal limits. No electrophysiologic evidence for neuromuscular disease, lumbar radiculopathy, polyneuropathy or other etiology to explain her symptoms.   Sarina Ill M.D.  [facility] [place],  [address] Tel: [phone] Fax: [phone]        [facility]    Nerve / Sites Muscle Latency Ref. Amplitude Ref. Rel Amp Segments Distance Velocity Ref. Area    ms ms mV mV %  cm m/s m/s mVms  L Peroneal - EDB     Ankle EDB 4.4 ?6.5 2.6 ?2.0 100 Ankle - EDB 9   12.9     Fib head EDB 10.7  2.0  77 Fib head - Ankle 28 44 ?44 11.7     Pop fossa EDB 13.5  2.0  100 Pop fossa - Fib head 10 36 ?44 9.1         Pop fossa - Ankle      R Peroneal - EDB     Ankle EDB 4.7 ?6.5 5.9 ?2.0 100 Ankle - EDB 9   22.2     Fib head EDB 10.7  5.2  88.1 Fib head - Ankle 27 45 ?44 20.4     Pop fossa EDB 13.0  4.6  88.6 Pop fossa - Fib head 10 45 ?44 17.7         Pop fossa - Ankle      L Tibial - AH      Ankle AH 3.7 ?5.8 13.0 ?4.0 100 Ankle - AH 9   28.6     Pop fossa AH 12.7  9.3  71.5 Pop fossa - Ankle 38 42 ?41 23.8  R Tibial - AH     Ankle AH 4.3 ?5.8 13.6 ?4.0 100 Ankle - AH 9   28.0     Pop fossa AH 13.3  9.5  70 Pop fossa - Ankle 39 43 ?41 26.3             SNC    Nerve / Sites Rec. Site Peak Lat Ref.  Amp Ref. Segments Distance    ms ms V V  cm  L Sural - Ankle (Calf)     Calf Ankle 4.4 ?4.4 6 ?6 Calf - Ankle 14  R Sural - Ankle (Calf)     Calf Ankle 4.1 ?4.4 6 ?6 Calf - Ankle 14  L Superficial peroneal - Ankle     Lat leg Ankle 4.4 ?4.4 6 ?6 Lat leg - Ankle 14  R Superficial peroneal - Ankle  Lat leg Ankle 4.2 ?4.4 6 ?6 Lat leg - Ankle 14              F  Wave    Nerve F Lat Ref.   ms ms  L Tibial - AH 55.4 ?56.0  R Tibial - AH 52.7 ?56.0         H Reflex    Nerve H Lat   ms   Left Right Ref.  Tibial - Soleus 32.4 31.5 ?35.0         EMG full       EMG Summary Table    Spontaneous MUAP Recruitment  Muscle IA Fib PSW Fasc Other Amp Dur. Poly Pattern  L. Biceps femoris (long head) Normal None None None _______ Normal Normal Normal Normal  R. Biceps femoris (long head) Normal None None None _______ Normal Normal Normal Normal  L. Vastus medialis Normal None None None _______ Normal Normal Normal Normal  R. Vastus medialis Normal None None None _______ Normal Normal Normal Normal  L. Tibialis anterior Normal None None None _______ Normal Normal Normal Normal  R. Tibialis anterior Normal None None None _______ Normal Normal Normal Normal  L. Gastrocnemius (Medial head) Normal None None None _______ Normal Normal Normal Normal  R. Gastrocnemius (Medial head) Normal None None None _______ Normal Normal Normal Normal  L. Peroneus longus Normal None None None _______ Normal Normal Normal Normal  R. Peroneus longus Normal None None None _______ Normal Normal Normal Normal  L. Lumbar paraspinals (low) Normal None None None _______ Normal Normal Normal Normal   R. Lumbar paraspinals (low) Normal None None None _______ Normal Normal Normal Normal  L. Gluteus maximus Normal None None None _______ Normal Normal Normal Normal  R. Gluteus maximus Normal None None None _______ Normal Normal Normal Normal  L. Gluteus medius Normal None None None _______ Normal Normal Normal Normal  R. Gluteus medius Normal None None None _______ Normal Normal Normal Normal  Bilateral Iliopsoas Normal None None None _______ Normal Normal Normal Normal

## 2019-03-17 ENCOUNTER — Other Ambulatory Visit: Payer: Self-pay | Admitting: Neurology

## 2019-03-17 DIAGNOSIS — G7000 Myasthenia gravis without (acute) exacerbation: Secondary | ICD-10-CM

## 2019-03-17 DIAGNOSIS — D0510 Intraductal carcinoma in situ of unspecified breast: Secondary | ICD-10-CM

## 2019-03-17 DIAGNOSIS — Z1371 Encounter for nonprocreative screening for genetic disease carrier status: Secondary | ICD-10-CM

## 2019-03-17 DIAGNOSIS — G7001 Myasthenia gravis with (acute) exacerbation: Secondary | ICD-10-CM

## 2019-03-17 DIAGNOSIS — Z8669 Personal history of other diseases of the nervous system and sense organs: Secondary | ICD-10-CM

## 2019-03-19 DIAGNOSIS — G5732 Lesion of lateral popliteal nerve, left lower limb: Secondary | ICD-10-CM | POA: Insufficient documentation

## 2019-03-19 NOTE — Procedures (Signed)
Full Name: Dr. York Grice Gender: Female MRN #: [id number] Date of Birth: Feb 13, 1956    Visit Date: 03/09/2019 07:56 Age: 63 Years 75 Months Old Examining Physician: Sarina Ill, MD  Referring Physician: [name], MD  History: Patient noting progressive weakness and atrophy of both her legs, worse on the right.  Summary: EMG/NCS was performed on the bilateral lower extremities.The left peroneal motor nerve showed decreased conduction velocity(Pop fossa-Fib head, 22m/s, N>44).All remaining nerves (as indicated in the following tables) were within normal limits.  All muscles (as indicated in the following tables) were within normal limits.      Conclusion:  1. Nerve conduction studies showed a mild slowing of the peroneal nerve across the left fibular head. Patient gives clinical history of crossing her legs often in a pattern that would compress this nerve which is the likely cause. 2. All remaining nerve conductions were normal. All muscles tested on EMG were within normal limits. No electrophysiologic evidence for neuromuscular disease, lumbar radiculopathy, polyneuropathy or other etiology to explain her symptoms.   Sarina Ill M.D.  [facility] [place], [place] [address] Tel: [phone] Fax: [phone]        [facility]    Nerve / Sites Muscle Latency Ref. Amplitude Ref. Rel Amp Segments Distance Velocity Ref. Area    ms ms mV mV %  cm m/s m/s mVms  L Peroneal - EDB     Ankle EDB 4.4 ?6.5 2.6 ?2.0 100 Ankle - EDB 9   12.9     Fib head EDB 10.7  2.0  77 Fib head - Ankle 28 44 ?44 11.7     Pop fossa EDB 13.5  2.0  100 Pop fossa - Fib head 10 36 ?44 9.1         Pop fossa - Ankle      R Peroneal - EDB     Ankle EDB 4.7 ?6.5 5.9 ?2.0 100 Ankle - EDB 9   22.2     Fib head EDB 10.7  5.2  88.1 Fib head - Ankle 27 45 ?44 20.4     Pop fossa EDB 13.0  4.6  88.6 Pop fossa - Fib head 10 45 ?44 17.7         Pop fossa - Ankle      L Tibial - AH      Ankle AH 3.7 ?5.8 13.0 ?4.0 100 Ankle - AH 9   28.6     Pop fossa AH 12.7  9.3  71.5 Pop fossa - Ankle 38 42 ?41 23.8  R Tibial - AH     Ankle AH 4.3 ?5.8 13.6 ?4.0 100 Ankle - AH 9   28.0     Pop fossa AH 13.3  9.5  70 Pop fossa - Ankle 39 43 ?41 26.3             SNC    Nerve / Sites Rec. Site Peak Lat Ref.  Amp Ref. Segments Distance    ms ms V V  cm  L Sural - Ankle (Calf)     Calf Ankle 4.4 ?4.4 6 ?6 Calf - Ankle 14  R Sural - Ankle (Calf)     Calf Ankle 4.1 ?4.4 6 ?6 Calf - Ankle 14  L Superficial peroneal - Ankle     Lat leg Ankle 4.4 ?4.4 6 ?6 Lat leg - Ankle 14  R Superficial peroneal - Ankle  Lat leg Ankle 4.2 ?4.4 6 ?6 Lat leg - Ankle 14              F  Wave    Nerve F Lat Ref.   ms ms  L Tibial - AH 55.4 ?56.0  R Tibial - AH 52.7 ?56.0         H Reflex    Nerve H Lat   ms   Left Right Ref.  Tibial - Soleus 32.4 31.5 ?35.0         EMG full       EMG Summary Table    Spontaneous MUAP Recruitment  Muscle IA Fib PSW Fasc Other Amp Dur. Poly Pattern  L. Biceps femoris (long head) Normal None None None _______ Normal Normal Normal Normal  R. Biceps femoris (long head) Normal None None None _______ Normal Normal Normal Normal  L. Vastus medialis Normal None None None _______ Normal Normal Normal Normal  R. Vastus medialis Normal None None None _______ Normal Normal Normal Normal  L. Tibialis anterior Normal None None None _______ Normal Normal Normal Normal  R. Tibialis anterior Normal None None None _______ Normal Normal Normal Normal  L. Gastrocnemius (Medial head) Normal None None None _______ Normal Normal Normal Normal  R. Gastrocnemius (Medial head) Normal None None None _______ Normal Normal Normal Normal  L. Peroneus longus Normal None None None _______ Normal Normal Normal Normal  R. Peroneus longus Normal None None None _______ Normal Normal Normal Normal  L. Lumbar paraspinals (low) Normal None None None _______ Normal Normal Normal Normal   R. Lumbar paraspinals (low) Normal None None None _______ Normal Normal Normal Normal  L. Gluteus maximus Normal None None None _______ Normal Normal Normal Normal  R. Gluteus maximus Normal None None None _______ Normal Normal Normal Normal  L. Gluteus medius Normal None None None _______ Normal Normal Normal Normal  R. Gluteus medius Normal None None None _______ Normal Normal Normal Normal  Bilateral Iliopsoas Normal None None None _______ Normal Normal Normal Normal

## 2019-03-21 ENCOUNTER — Encounter: Payer: Self-pay | Admitting: Sports Medicine

## 2019-03-21 ENCOUNTER — Other Ambulatory Visit: Payer: Self-pay

## 2019-03-21 ENCOUNTER — Ambulatory Visit (INDEPENDENT_AMBULATORY_CARE_PROVIDER_SITE_OTHER): Payer: 59 | Admitting: Sports Medicine

## 2019-03-21 DIAGNOSIS — M25522 Pain in left elbow: Secondary | ICD-10-CM | POA: Diagnosis not present

## 2019-03-21 NOTE — Progress Notes (Signed)
   Subjective:    Patient ID: Felicia Cain, female    DOB: Mar 02, 1956, 63 y.o.   MRN: [id number]  HPI   Patient comes in today for follow-up on left elbow pain.  She still experiencing diffuse pain around the elbow.  She continues to endorse swelling along the medial elbow.  Main pain is with full extension.  She states she has pain over the tip of the olecranon with direct pressure.  Also radiating pain down into the forearm.  X-rays of her elbow done recently showed no signs of fracture or elbow effusion.  She has tried a compression sleeve but finds it uncomfortable.   Review of Systems As above    Objective:   Physical Exam  Well-developed, well-nourished.  No acute distress.  Awake alert and oriented x3.  Vital signs reviewed  Left elbow: Full active range of motion.  No obvious effusion.  There is some soft tissue swelling along the medial elbow but no palpable hematoma.  Ulnar collateral ligament is stable to stressing.  She is tender to palpation diffusely around medial and lateral elbow.  No olecranon swelling.  She has full strength with resisted wrist extension and wrist flexion.  Biceps tendon is intact.  Neurovascularly intact distally.      Assessment & Plan:   Persistent left elbow pain likely secondary to soft tissue contusion  In reviewing the patient's mechanism of injury, she fell directly onto the left elbow.  X-rays do not show any obvious fracture or effusion.  I suspect that she has a significant soft tissue contusion and I recommended that we try a simple Ace wrap for compression.  I encouraged her to continue active and passive range of motion to prevent elbow stiffness.  If symptoms do not start to improve over the next week or 2 then I would recommend further advanced imaging in the form of an MRI.  Patient will follow-up for ongoing or recalcitrant issues.  This note was dictated using Dragon naturally speaking software and may contain errors in syntax,  spelling, or content which have not been identified prior to signing this note.

## 2019-03-27 ENCOUNTER — Telehealth: Payer: Self-pay | Admitting: Internal Medicine

## 2019-03-27 ENCOUNTER — Telehealth: Payer: Self-pay | Admitting: Sports Medicine

## 2019-03-27 DIAGNOSIS — M25522 Pain in left elbow: Secondary | ICD-10-CM

## 2019-03-27 NOTE — Addendum Note (Signed)
Addended by: Cyd Silence on: 03/27/2019 01:39 PM   Modules accepted: Orders

## 2019-03-27 NOTE — Telephone Encounter (Signed)
Her knee has gotten better but she does want an MRI of the elbow.  Please call her to discuss, [phone]

## 2019-03-27 NOTE — Telephone Encounter (Signed)
  I received a telephone call from the patient today stating that her knee is feeling better but her left elbow continues to be quite painful.  Therefore, we will cancel the MRI of her knee but will schedule an MRI of her left elbow.  Patient will follow-up with me in the office after that study to review the results and delineate further treatment.

## 2019-03-29 ENCOUNTER — Other Ambulatory Visit: Payer: 59

## 2019-03-29 ENCOUNTER — Ambulatory Visit
Admission: RE | Admit: 2019-03-29 | Discharge: 2019-03-29 | Disposition: A | Discharge status: Home / Self Care | Payer: 59 | Source: Ambulatory Visit | Attending: Sports Medicine | Admitting: Sports Medicine

## 2019-03-29 DIAGNOSIS — M67824 Other specified disorders of tendon, left elbow: Secondary | ICD-10-CM | POA: Diagnosis not present

## 2019-03-29 DIAGNOSIS — M19022 Primary osteoarthritis, left elbow: Secondary | ICD-10-CM | POA: Diagnosis not present

## 2019-03-29 DIAGNOSIS — M25522 Pain in left elbow: Secondary | ICD-10-CM

## 2019-04-03 ENCOUNTER — Telehealth: Payer: Self-pay | Admitting: Sports Medicine

## 2019-04-03 NOTE — Telephone Encounter (Signed)
  I spoke with the patient on the phone today after reviewing MRI findings of her left elbow.  MRI shows some very mild common flexor and common extensor tendinopathy.  No full-thickness tearing is seen.  I recommended physical therapy but the patient would like to wait on that for now.  She is questioning whether or not she may have a nerve injury as she is now endorsing some radiating pain down the forearm into the thumb which feels neuropathic to her.  I explained that she certainly could have a peripheral nerve injury as a result of her fall.  She has apparently seen Dr. Nicholaus Bloom in the past for similar problems and had good results with treatment through his office.  I am happy to make a referral if she would like. She is also asking about trying a compression sleeve instead of an Ace wrap for her elbow.  I certainly think that is reasonable to try.  She will come by the office sometime within the next day or two to pick this up.  She will call me if she reconsiders PT.  Otherwise, follow-up as needed.

## 2019-04-04 DIAGNOSIS — M25522 Pain in left elbow: Secondary | ICD-10-CM | POA: Diagnosis not present

## 2019-04-05 ENCOUNTER — Other Ambulatory Visit (INDEPENDENT_AMBULATORY_CARE_PROVIDER_SITE_OTHER): Payer: Self-pay | Admitting: Orthopaedic Surgery

## 2019-04-05 MED FILL — OLOPATADINE HCL 0.2 % SOLN: 0.2 | 75 days supply | Qty: 8 | Fill #4

## 2019-04-05 MED FILL — DOXYCYCLINE HYC 50 MG CAP: 50 | 90 days supply | Qty: 90 | Fill #3

## 2019-04-05 MED FILL — GABAPENTIN 300 MG CAPSULE: 300 | 30 days supply | Qty: 180 | Fill #0

## 2019-04-05 MED FILL — FOLIC ACID 1 MG TABS: 1 | 30 days supply | Qty: 60 | Fill #1

## 2019-04-05 MED FILL — METHOTREXATE SODIUM 2.5 MG: 2.5 | 28 days supply | Qty: 12 | Fill #0

## 2019-04-05 NOTE — Telephone Encounter (Signed)
Please advise 

## 2019-04-12 DIAGNOSIS — M5416 Radiculopathy, lumbar region: Secondary | ICD-10-CM | POA: Diagnosis not present

## 2019-04-12 DIAGNOSIS — M62562 Muscle wasting and atrophy, not elsewhere classified, left lower leg: Secondary | ICD-10-CM | POA: Diagnosis not present

## 2019-04-12 DIAGNOSIS — M62561 Muscle wasting and atrophy, not elsewhere classified, right lower leg: Secondary | ICD-10-CM | POA: Diagnosis not present

## 2019-04-12 DIAGNOSIS — M4316 Spondylolisthesis, lumbar region: Secondary | ICD-10-CM | POA: Diagnosis not present

## 2019-04-12 DIAGNOSIS — M545 Low back pain: Secondary | ICD-10-CM | POA: Diagnosis not present

## 2019-04-19 DIAGNOSIS — L719 Rosacea, unspecified: Secondary | ICD-10-CM | POA: Diagnosis not present

## 2019-04-19 DIAGNOSIS — Z79899 Other long term (current) drug therapy: Secondary | ICD-10-CM | POA: Diagnosis not present

## 2019-04-19 DIAGNOSIS — L661 Lichen planopilaris: Secondary | ICD-10-CM | POA: Diagnosis not present

## 2019-04-19 MED FILL — CLOBETASOL PROPIONATE 0.05: 0.05 | 30 days supply | Qty: 50 | Fill #0

## 2019-04-20 MED FILL — METHOTREXATE SODIUM 2.5 MG: 2.5 | 28 days supply | Qty: 20 | Fill #0

## 2019-05-04 DIAGNOSIS — H16223 Keratoconjunctivitis sicca, not specified as Sjogren's, bilateral: Secondary | ICD-10-CM | POA: Diagnosis not present

## 2019-05-04 DIAGNOSIS — H43812 Vitreous degeneration, left eye: Secondary | ICD-10-CM | POA: Diagnosis not present

## 2019-05-04 DIAGNOSIS — H30142 Acute posterior multifocal placoid pigment epitheliopathy, left eye: Secondary | ICD-10-CM | POA: Diagnosis not present

## 2019-05-04 DIAGNOSIS — H468 Other optic neuritis: Secondary | ICD-10-CM | POA: Diagnosis not present

## 2019-05-04 DIAGNOSIS — H3554 Dystrophies primarily involving the retinal pigment epithelium: Secondary | ICD-10-CM | POA: Diagnosis not present

## 2019-05-04 DIAGNOSIS — H40012 Open angle with borderline findings, low risk, left eye: Secondary | ICD-10-CM | POA: Diagnosis not present

## 2019-05-04 MED FILL — CEQUA 0.09 % SOLN: 0.09 | 30 days supply | Qty: 60 | Fill #0

## 2019-05-09 DIAGNOSIS — L661 Lichen planopilaris: Secondary | ICD-10-CM | POA: Diagnosis not present

## 2019-05-09 DIAGNOSIS — Z5181 Encounter for therapeutic drug level monitoring: Secondary | ICD-10-CM | POA: Diagnosis not present

## 2019-05-12 MED FILL — TRETINOIN 0.1 % CREA: 0.1 | 30 days supply | Qty: 45 | Fill #0

## 2019-05-16 MED FILL — GABAPENTIN 300 MG CAPSULE: 300 | 30 days supply | Qty: 180 | Fill #1

## 2019-05-16 MED FILL — FOLIC ACID 1 MG TABS: 1 | 30 days supply | Qty: 60 | Fill #2

## 2019-05-16 MED FILL — METHOTREXATE SODIUM 2.5 MG: 2.5 | 28 days supply | Qty: 20 | Fill #0

## 2019-05-30 DIAGNOSIS — G4733 Obstructive sleep apnea (adult) (pediatric): Secondary | ICD-10-CM | POA: Diagnosis not present

## 2019-05-30 DIAGNOSIS — G7000 Myasthenia gravis without (acute) exacerbation: Secondary | ICD-10-CM | POA: Diagnosis not present

## 2019-05-31 DIAGNOSIS — F321 Major depressive disorder, single episode, moderate: Secondary | ICD-10-CM | POA: Diagnosis not present

## 2019-06-01 DIAGNOSIS — H16212 Exposure keratoconjunctivitis, left eye: Secondary | ICD-10-CM | POA: Diagnosis not present

## 2019-06-01 DIAGNOSIS — G7000 Myasthenia gravis without (acute) exacerbation: Secondary | ICD-10-CM | POA: Diagnosis not present

## 2019-06-01 DIAGNOSIS — H16223 Keratoconjunctivitis sicca, not specified as Sjogren's, bilateral: Secondary | ICD-10-CM | POA: Diagnosis not present

## 2019-06-01 DIAGNOSIS — G4733 Obstructive sleep apnea (adult) (pediatric): Secondary | ICD-10-CM | POA: Diagnosis not present

## 2019-06-01 DIAGNOSIS — H2513 Age-related nuclear cataract, bilateral: Secondary | ICD-10-CM | POA: Diagnosis not present

## 2019-06-01 DIAGNOSIS — H30142 Acute posterior multifocal placoid pigment epitheliopathy, left eye: Secondary | ICD-10-CM | POA: Diagnosis not present

## 2019-06-01 DIAGNOSIS — H40012 Open angle with borderline findings, low risk, left eye: Secondary | ICD-10-CM | POA: Diagnosis not present

## 2019-06-01 DIAGNOSIS — H468 Other optic neuritis: Secondary | ICD-10-CM | POA: Diagnosis not present

## 2019-06-02 MED FILL — DESVENLAFAXINE SUC ER 50 MG: 50 | 90 days supply | Qty: 90 | Fill #1

## 2019-06-02 MED FILL — CEQUA 0.09 % SOLN: 0.09 | 30 days supply | Qty: 60 | Fill #1

## 2019-06-02 MED FILL — CLOBETASOL PROPIONATE 0.05: 0.05 | 30 days supply | Qty: 50 | Fill #1

## 2019-06-07 DIAGNOSIS — Z23 Encounter for immunization: Secondary | ICD-10-CM | POA: Diagnosis not present

## 2019-06-07 MED FILL — PYRIDOSTIGMINE BR 60 MG TAB: 60 | 30 days supply | Qty: 90 | Fill #1

## 2019-06-21 MED FILL — FINASTERIDE 5 MG TABLET: 5 | 28 days supply | Qty: 7 | Fill #0

## 2019-06-21 MED FILL — METHOTREXATE SODIUM 2.5 MG: 2.5 | 28 days supply | Qty: 20 | Fill #0

## 2019-06-21 MED FILL — FOLIC ACID 1 MG TABS: 1 | 30 days supply | Qty: 60 | Fill #0

## 2019-06-27 MED FILL — DOXYCYCLINE HYC 50 MG CAP: 50 | 30 days supply | Qty: 30 | Fill #4

## 2019-07-10 DIAGNOSIS — M6281 Muscle weakness (generalized): Secondary | ICD-10-CM | POA: Diagnosis not present

## 2019-07-10 DIAGNOSIS — R262 Difficulty in walking, not elsewhere classified: Secondary | ICD-10-CM | POA: Diagnosis not present

## 2019-07-10 DIAGNOSIS — R296 Repeated falls: Secondary | ICD-10-CM | POA: Diagnosis not present

## 2019-07-12 DIAGNOSIS — M6281 Muscle weakness (generalized): Secondary | ICD-10-CM | POA: Diagnosis not present

## 2019-07-12 DIAGNOSIS — R262 Difficulty in walking, not elsewhere classified: Secondary | ICD-10-CM | POA: Diagnosis not present

## 2019-07-12 DIAGNOSIS — R296 Repeated falls: Secondary | ICD-10-CM | POA: Diagnosis not present

## 2019-07-14 DIAGNOSIS — M9902 Segmental and somatic dysfunction of thoracic region: Secondary | ICD-10-CM | POA: Diagnosis not present

## 2019-07-14 DIAGNOSIS — M9904 Segmental and somatic dysfunction of sacral region: Secondary | ICD-10-CM | POA: Diagnosis not present

## 2019-07-14 DIAGNOSIS — M9901 Segmental and somatic dysfunction of cervical region: Secondary | ICD-10-CM | POA: Diagnosis not present

## 2019-07-14 DIAGNOSIS — M9903 Segmental and somatic dysfunction of lumbar region: Secondary | ICD-10-CM | POA: Diagnosis not present

## 2019-07-14 DIAGNOSIS — M9905 Segmental and somatic dysfunction of pelvic region: Secondary | ICD-10-CM | POA: Diagnosis not present

## 2019-07-14 DIAGNOSIS — M5030 Other cervical disc degeneration, unspecified cervical region: Secondary | ICD-10-CM | POA: Diagnosis not present

## 2019-07-17 DIAGNOSIS — M6281 Muscle weakness (generalized): Secondary | ICD-10-CM | POA: Diagnosis not present

## 2019-07-17 DIAGNOSIS — R296 Repeated falls: Secondary | ICD-10-CM | POA: Diagnosis not present

## 2019-07-17 DIAGNOSIS — R262 Difficulty in walking, not elsewhere classified: Secondary | ICD-10-CM | POA: Diagnosis not present

## 2019-07-17 MED FILL — FINASTERIDE 5 MG TABLET: 5 | 28 days supply | Qty: 7 | Fill #1

## 2019-07-17 MED FILL — METHOTREXATE SODIUM 2.5 MG: 2.5 | 28 days supply | Qty: 20 | Fill #1

## 2019-07-18 DIAGNOSIS — M9904 Segmental and somatic dysfunction of sacral region: Secondary | ICD-10-CM | POA: Diagnosis not present

## 2019-07-18 DIAGNOSIS — M9905 Segmental and somatic dysfunction of pelvic region: Secondary | ICD-10-CM | POA: Diagnosis not present

## 2019-07-18 DIAGNOSIS — M5030 Other cervical disc degeneration, unspecified cervical region: Secondary | ICD-10-CM | POA: Diagnosis not present

## 2019-07-18 DIAGNOSIS — M9902 Segmental and somatic dysfunction of thoracic region: Secondary | ICD-10-CM | POA: Diagnosis not present

## 2019-07-18 DIAGNOSIS — M9903 Segmental and somatic dysfunction of lumbar region: Secondary | ICD-10-CM | POA: Diagnosis not present

## 2019-07-18 DIAGNOSIS — M9901 Segmental and somatic dysfunction of cervical region: Secondary | ICD-10-CM | POA: Diagnosis not present

## 2019-07-19 DIAGNOSIS — M9903 Segmental and somatic dysfunction of lumbar region: Secondary | ICD-10-CM | POA: Diagnosis not present

## 2019-07-19 DIAGNOSIS — M9902 Segmental and somatic dysfunction of thoracic region: Secondary | ICD-10-CM | POA: Diagnosis not present

## 2019-07-19 DIAGNOSIS — M9901 Segmental and somatic dysfunction of cervical region: Secondary | ICD-10-CM | POA: Diagnosis not present

## 2019-07-19 DIAGNOSIS — M9904 Segmental and somatic dysfunction of sacral region: Secondary | ICD-10-CM | POA: Diagnosis not present

## 2019-07-19 DIAGNOSIS — M5030 Other cervical disc degeneration, unspecified cervical region: Secondary | ICD-10-CM | POA: Diagnosis not present

## 2019-07-19 DIAGNOSIS — M9905 Segmental and somatic dysfunction of pelvic region: Secondary | ICD-10-CM | POA: Diagnosis not present

## 2019-07-20 ENCOUNTER — Ambulatory Visit
Admission: RE | Admit: 2019-07-20 | Discharge: 2019-07-20 | Disposition: A | Discharge status: Home / Self Care | Payer: PPO | Source: Ambulatory Visit | Attending: Sports Medicine | Admitting: Sports Medicine

## 2019-07-20 ENCOUNTER — Ambulatory Visit (INDEPENDENT_AMBULATORY_CARE_PROVIDER_SITE_OTHER): Payer: PPO | Admitting: Sports Medicine

## 2019-07-20 ENCOUNTER — Encounter: Payer: Self-pay | Admitting: Sports Medicine

## 2019-07-20 ENCOUNTER — Other Ambulatory Visit: Payer: Self-pay

## 2019-07-20 VITALS — BP 110/78 | Ht 66.0 in | Wt 150.0 lb

## 2019-07-20 DIAGNOSIS — S79911A Unspecified injury of right hip, initial encounter: Secondary | ICD-10-CM | POA: Diagnosis not present

## 2019-07-20 DIAGNOSIS — M25522 Pain in left elbow: Secondary | ICD-10-CM | POA: Diagnosis not present

## 2019-07-20 DIAGNOSIS — M25551 Pain in right hip: Secondary | ICD-10-CM | POA: Diagnosis not present

## 2019-07-20 NOTE — Progress Notes (Signed)
Felicia Cain - 63 y.o. female MRN [id number]  Date of birth: [birth year]  SUBJECTIVE:   CC: Follow-up for left elbow pain and new complaint of right hip pain  Left elbow pain Patient presents for 16-month follow-up of left elbow pain s/p fall.  In 03/2019, MRI showing mild medial and lateral epicondylitis and mild degenerative changes.  At that time patient wished to pursue conservative therapy with home exercises and NSAID treatment.  Since that time patient endorses mild worsening of pain, numbness, weakness in left upper extremity.  She states that she frequently drops items with her left hand.  Endorses intermittent numbness/tingling in fourth and fifth digits.  Moderate to significant pain at medial and lateral elbow when lifting objects.  She has not had physical therapy in the past and is interested in pursuing this option at this time.  Right hip pain New onset since fall in 06/2019.  Patient states that she tripped and fell, with majority of weight on right hip.  She was able to weight-bear afterwards, so she did not seek medical attention.  Since that time, she is experienced locking and clicking in anterior right hip, significant pain and weakness in right hip.  Denies radiation of pain/numbness/tingling.  Denies additional falls, trauma.  Patient has history of right femoral neck fracture s/p repair in 2018.  She has started to use a 1 pronged walker for stability.  ROS: No unexpected weight loss, fever, chills, swelling, instability, muscle pain, numbness/tingling, redness, otherwise see HPI   PMHx - Updated and reviewed.  Contributory factors include: Negative PSHx - Updated and reviewed.  Contributory factors include:  Negative FHx - Updated and reviewed.  Contributory factors include:  Negative Social Hx - Updated and reviewed. Contributory factors include: Negative Medications - reviewed   DATA REVIEWED: Prior notes and imaging  PHYSICAL EXAM:  VS: BP:110/78  HR: bpm   TEMP: ( )  RESP:   HT:5\' 6"  (167.6 cm)   WT:150 lb (68 kg)  BMI:24.22 PHYSICAL EXAM:   Musculoskeletal:  -Upper extremity: Full active range of motion bilateral shoulders, elbows, wrists.  No obvious effusion.  There is some soft tissue swelling along the medial left elbow but no palpable hematoma.  Ulnar collateral ligament is stable to stressing.  She is tender to palpation diffusely around medial and lateral elbow.  No olecranon swelling.  She has full strength with resisted wrist extension and wrist flexion, but both with tenderness.  Biceps tendon is intact.  Neurovascularly intact distally.  Biceps, brachioradialis, triceps reflexes 2+ bilaterally. -Lower extremities: Flexion limited in right hip due to weakness/pain.  Patient able to fully flex hip with concurrent abduction.  Otherwise full range of motion of hips bilaterally.  Muscle strength right quadriceps 4/5 and otherwise muscle strength lower extremities 5/5 bilaterally.  No gross deformity.  Leg length equal.  Neurovascularly intact distally.   ASSESSMENT & PLAN:   #Lateral epicondylitis, left #Medial epicondylitis, left -Chronic and mildly worsening -New neurologic findings including numbness in fourth and fifth digit and frequently dropping items -Start PT for lateral medial epicondylitis -Ordered EMG left upper extremity to further evaluate progressing neurologic findings -Continue NSAIDs/Tylenol as needed for pain control  #Right hip pain -Acute -History of right femoral neck fracture s/p ORIF in 2018 -Ordered right hip x-ray -We will call patient with x-ray results and discuss treatment plan based on findings -Encourage patient to use 1 prong walker for support -Continue NSAIDs/Tylenol as needed for pain control   Portions of this  note were written by medical resident, Glennon Mac, PGY 3.   Patient seen and evaluated with the resident.  I agree with the above plan of care.  X-rays of the right hip show  nothing acute.  Hardware is properly located.  Patient will proceed with EMG/nerve conduction study of the left upper extremity and I will call her with those results when available.  She will also start formal physical therapy for lateral and medial epicondylitis of the left elbow.

## 2019-07-24 DIAGNOSIS — R262 Difficulty in walking, not elsewhere classified: Secondary | ICD-10-CM | POA: Diagnosis not present

## 2019-07-24 DIAGNOSIS — R296 Repeated falls: Secondary | ICD-10-CM | POA: Diagnosis not present

## 2019-07-24 DIAGNOSIS — M6281 Muscle weakness (generalized): Secondary | ICD-10-CM | POA: Diagnosis not present

## 2019-07-26 DIAGNOSIS — R262 Difficulty in walking, not elsewhere classified: Secondary | ICD-10-CM | POA: Diagnosis not present

## 2019-07-26 DIAGNOSIS — M6281 Muscle weakness (generalized): Secondary | ICD-10-CM | POA: Diagnosis not present

## 2019-07-26 DIAGNOSIS — R296 Repeated falls: Secondary | ICD-10-CM | POA: Diagnosis not present

## 2019-07-28 DIAGNOSIS — M9901 Segmental and somatic dysfunction of cervical region: Secondary | ICD-10-CM | POA: Diagnosis not present

## 2019-07-28 DIAGNOSIS — M9904 Segmental and somatic dysfunction of sacral region: Secondary | ICD-10-CM | POA: Diagnosis not present

## 2019-07-28 DIAGNOSIS — M9903 Segmental and somatic dysfunction of lumbar region: Secondary | ICD-10-CM | POA: Diagnosis not present

## 2019-07-28 DIAGNOSIS — M9902 Segmental and somatic dysfunction of thoracic region: Secondary | ICD-10-CM | POA: Diagnosis not present

## 2019-07-28 DIAGNOSIS — M5030 Other cervical disc degeneration, unspecified cervical region: Secondary | ICD-10-CM | POA: Diagnosis not present

## 2019-07-28 DIAGNOSIS — M9905 Segmental and somatic dysfunction of pelvic region: Secondary | ICD-10-CM | POA: Diagnosis not present

## 2019-08-01 DIAGNOSIS — M85862 Other specified disorders of bone density and structure, left lower leg: Secondary | ICD-10-CM | POA: Diagnosis not present

## 2019-08-01 DIAGNOSIS — K219 Gastro-esophageal reflux disease without esophagitis: Secondary | ICD-10-CM | POA: Diagnosis not present

## 2019-08-01 DIAGNOSIS — Z79899 Other long term (current) drug therapy: Secondary | ICD-10-CM | POA: Diagnosis not present

## 2019-08-01 DIAGNOSIS — M25542 Pain in joints of left hand: Secondary | ICD-10-CM | POA: Diagnosis not present

## 2019-08-01 DIAGNOSIS — M25541 Pain in joints of right hand: Secondary | ICD-10-CM | POA: Diagnosis not present

## 2019-08-01 DIAGNOSIS — Z0000 Encounter for general adult medical examination without abnormal findings: Secondary | ICD-10-CM | POA: Diagnosis not present

## 2019-08-01 DIAGNOSIS — M65342 Trigger finger, left ring finger: Secondary | ICD-10-CM | POA: Diagnosis not present

## 2019-08-02 DIAGNOSIS — M9904 Segmental and somatic dysfunction of sacral region: Secondary | ICD-10-CM | POA: Diagnosis not present

## 2019-08-02 DIAGNOSIS — M25542 Pain in joints of left hand: Secondary | ICD-10-CM | POA: Diagnosis not present

## 2019-08-02 DIAGNOSIS — Z79899 Other long term (current) drug therapy: Secondary | ICD-10-CM | POA: Diagnosis not present

## 2019-08-02 DIAGNOSIS — M9901 Segmental and somatic dysfunction of cervical region: Secondary | ICD-10-CM | POA: Diagnosis not present

## 2019-08-02 DIAGNOSIS — M5030 Other cervical disc degeneration, unspecified cervical region: Secondary | ICD-10-CM | POA: Diagnosis not present

## 2019-08-02 DIAGNOSIS — M85862 Other specified disorders of bone density and structure, left lower leg: Secondary | ICD-10-CM | POA: Diagnosis not present

## 2019-08-02 DIAGNOSIS — Z0000 Encounter for general adult medical examination without abnormal findings: Secondary | ICD-10-CM | POA: Diagnosis not present

## 2019-08-02 DIAGNOSIS — Z13220 Encounter for screening for lipoid disorders: Secondary | ICD-10-CM | POA: Diagnosis not present

## 2019-08-02 DIAGNOSIS — M9902 Segmental and somatic dysfunction of thoracic region: Secondary | ICD-10-CM | POA: Diagnosis not present

## 2019-08-02 DIAGNOSIS — M9903 Segmental and somatic dysfunction of lumbar region: Secondary | ICD-10-CM | POA: Diagnosis not present

## 2019-08-02 DIAGNOSIS — M9905 Segmental and somatic dysfunction of pelvic region: Secondary | ICD-10-CM | POA: Diagnosis not present

## 2019-08-07 ENCOUNTER — Other Ambulatory Visit: Payer: Self-pay | Admitting: Geriatric Medicine

## 2019-08-07 DIAGNOSIS — M6281 Muscle weakness (generalized): Secondary | ICD-10-CM | POA: Diagnosis not present

## 2019-08-07 DIAGNOSIS — R296 Repeated falls: Secondary | ICD-10-CM | POA: Diagnosis not present

## 2019-08-07 DIAGNOSIS — M8580 Other specified disorders of bone density and structure, unspecified site: Secondary | ICD-10-CM

## 2019-08-07 DIAGNOSIS — R262 Difficulty in walking, not elsewhere classified: Secondary | ICD-10-CM | POA: Diagnosis not present

## 2019-08-07 MED FILL — FOLIC ACID 1 MG TABS: 1 | 60 days supply | Qty: 120 | Fill #1

## 2019-08-07 MED FILL — DOXYCYCLINE HYC 50 MG CAP: 50 | 30 days supply | Qty: 30 | Fill #5

## 2019-08-07 MED FILL — FINASTERIDE 5 MG TABLET: 5 | 28 days supply | Qty: 7 | Fill #2

## 2019-08-09 DIAGNOSIS — R262 Difficulty in walking, not elsewhere classified: Secondary | ICD-10-CM | POA: Diagnosis not present

## 2019-08-09 DIAGNOSIS — R296 Repeated falls: Secondary | ICD-10-CM | POA: Diagnosis not present

## 2019-08-09 DIAGNOSIS — M6281 Muscle weakness (generalized): Secondary | ICD-10-CM | POA: Diagnosis not present

## 2019-08-11 DIAGNOSIS — M9903 Segmental and somatic dysfunction of lumbar region: Secondary | ICD-10-CM | POA: Diagnosis not present

## 2019-08-11 DIAGNOSIS — M9901 Segmental and somatic dysfunction of cervical region: Secondary | ICD-10-CM | POA: Diagnosis not present

## 2019-08-11 DIAGNOSIS — M9905 Segmental and somatic dysfunction of pelvic region: Secondary | ICD-10-CM | POA: Diagnosis not present

## 2019-08-11 DIAGNOSIS — M9902 Segmental and somatic dysfunction of thoracic region: Secondary | ICD-10-CM | POA: Diagnosis not present

## 2019-08-11 DIAGNOSIS — M5030 Other cervical disc degeneration, unspecified cervical region: Secondary | ICD-10-CM | POA: Diagnosis not present

## 2019-08-11 DIAGNOSIS — M9904 Segmental and somatic dysfunction of sacral region: Secondary | ICD-10-CM | POA: Diagnosis not present

## 2019-08-12 DIAGNOSIS — R0981 Nasal congestion: Secondary | ICD-10-CM | POA: Diagnosis not present

## 2019-08-22 DIAGNOSIS — M1990 Unspecified osteoarthritis, unspecified site: Secondary | ICD-10-CM | POA: Diagnosis not present

## 2019-08-22 DIAGNOSIS — M79642 Pain in left hand: Secondary | ICD-10-CM | POA: Diagnosis not present

## 2019-08-22 DIAGNOSIS — M791 Myalgia, unspecified site: Secondary | ICD-10-CM | POA: Diagnosis not present

## 2019-08-22 DIAGNOSIS — M2560 Stiffness of unspecified joint, not elsewhere classified: Secondary | ICD-10-CM | POA: Diagnosis not present

## 2019-08-22 DIAGNOSIS — M7989 Other specified soft tissue disorders: Secondary | ICD-10-CM | POA: Diagnosis not present

## 2019-08-22 DIAGNOSIS — R1310 Dysphagia, unspecified: Secondary | ICD-10-CM | POA: Diagnosis not present

## 2019-08-22 DIAGNOSIS — K219 Gastro-esophageal reflux disease without esophagitis: Secondary | ICD-10-CM | POA: Diagnosis not present

## 2019-08-22 DIAGNOSIS — L661 Lichen planopilaris: Secondary | ICD-10-CM | POA: Diagnosis not present

## 2019-08-22 DIAGNOSIS — M79641 Pain in right hand: Secondary | ICD-10-CM | POA: Diagnosis not present

## 2019-08-22 DIAGNOSIS — M79643 Pain in unspecified hand: Secondary | ICD-10-CM | POA: Diagnosis not present

## 2019-08-22 DIAGNOSIS — Z79899 Other long term (current) drug therapy: Secondary | ICD-10-CM | POA: Diagnosis not present

## 2019-08-22 DIAGNOSIS — M5136 Other intervertebral disc degeneration, lumbar region: Secondary | ICD-10-CM | POA: Diagnosis not present

## 2019-08-22 DIAGNOSIS — M79671 Pain in right foot: Secondary | ICD-10-CM | POA: Diagnosis not present

## 2019-08-22 DIAGNOSIS — M549 Dorsalgia, unspecified: Secondary | ICD-10-CM | POA: Diagnosis not present

## 2019-08-22 DIAGNOSIS — R110 Nausea: Secondary | ICD-10-CM | POA: Diagnosis not present

## 2019-08-22 DIAGNOSIS — M79672 Pain in left foot: Secondary | ICD-10-CM | POA: Diagnosis not present

## 2019-08-22 DIAGNOSIS — M2550 Pain in unspecified joint: Secondary | ICD-10-CM | POA: Diagnosis not present

## 2019-08-22 DIAGNOSIS — M533 Sacrococcygeal disorders, not elsewhere classified: Secondary | ICD-10-CM | POA: Diagnosis not present

## 2019-08-28 MED FILL — METHOTREXATE SODIUM 2.5 MG: 2.5 | 28 days supply | Qty: 20 | Fill #2

## 2019-08-29 ENCOUNTER — Ambulatory Visit
Admission: RE | Admit: 2019-08-29 | Discharge: 2019-08-29 | Disposition: A | Discharge status: Home / Self Care | Payer: PPO | Source: Ambulatory Visit | Attending: Sports Medicine | Admitting: Sports Medicine

## 2019-08-29 ENCOUNTER — Ambulatory Visit: Payer: PPO | Admitting: Sports Medicine

## 2019-08-29 ENCOUNTER — Other Ambulatory Visit: Payer: Self-pay

## 2019-08-29 ENCOUNTER — Other Ambulatory Visit: Payer: Self-pay | Admitting: *Deleted

## 2019-08-29 ENCOUNTER — Encounter: Payer: Self-pay | Admitting: Sports Medicine

## 2019-08-29 VITALS — BP 105/62 | Ht 66.0 in | Wt 153.0 lb

## 2019-08-29 DIAGNOSIS — M25571 Pain in right ankle and joints of right foot: Secondary | ICD-10-CM

## 2019-08-29 DIAGNOSIS — W19XXXA Unspecified fall, initial encounter: Secondary | ICD-10-CM | POA: Diagnosis not present

## 2019-08-29 DIAGNOSIS — M25579 Pain in unspecified ankle and joints of unspecified foot: Secondary | ICD-10-CM

## 2019-08-29 DIAGNOSIS — M25372 Other instability, left ankle: Secondary | ICD-10-CM

## 2019-08-29 DIAGNOSIS — M79671 Pain in right foot: Secondary | ICD-10-CM | POA: Diagnosis not present

## 2019-08-29 DIAGNOSIS — M25572 Pain in left ankle and joints of left foot: Secondary | ICD-10-CM | POA: Diagnosis not present

## 2019-08-29 NOTE — Progress Notes (Signed)
   [facility] [address] [place], [place] [address] Phone: [phone] Fax: [phone]   Patient Name: Felicia Cain Date of Birth: 09/30/1955 Medical Record Number: [id number] Gender: female Date of Encounter: 08/29/2019  SUBJECTIVE:      Chief Complaint:  Bilateral ankle pain   HPI:  Julietta is a 63 year old F presenting with acute bilateral ankle pain.  Yesterday she was walking into her house and tripped over a pile of leaves.  She describes her left ankle rolling in on itself.  She describes her right foot hitting the brake on the base of her house.  She was unable to get up without assistance.  She had to utilize a walker last night.  In regards to medications, she tried oxycodone, gabapentin, ibuprofen last night with little relief.  She describes vomiting from the pain.  This morning she states she was unable to bear weight and had to get around via wheelchair.  She feels the right foot is swollen.  She denies any numbness, tingling, erythema, bruising, or skin changes.  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   ROS:     See HPI.   PERTINENT  PMH / PSH / FH / SH:  Past Medical, Surgical, Social, and Family History Reviewed & Updated in the EMR. Pertinent findings include:  Myasthenia gravis, left hip fracture, right hip fracture.  She is seeing a rheumatologist and has had extensive work-up for inflammatory disease.   OBJECTIVE:  BP 105/62   Ht 5\' 6"  (1.676 m)   Wt 153 lb (69.4 kg)   BMI 24.69 kg/m  Physical Exam:  Vital signs are reviewed.   GEN: Alert and oriented, NAD Pulm: Breathing unlabored PSY: normal mood, congruent affect  MSK: Right foot No erythema Mild swelling at dorsum of right foot Pain with active and passive dorsiflexion Strength is weak at ankle, but is equal bilaterally TTP at base fifth metatarsal, top of foot between first and second  metatarsal, and at ankle Pain with resisted toe flexion Negative compression test Normal transverse and medial arch NVI  Right ankle No gross deformity, swelling, ecchymoses Negative ant drawer and talar tilt.   Negative syndesmotic compression. Thompsons test negative. Positive Klieger's NV intact distally.  Left ankle No gross deformity, swelling, ecchymoses FROM with increased laxity at subtalar joint Mild TTP to lateral ankle and foot Negative ant drawer and talar tilt.   Negative syndesmotic compression. Thompsons test negative. NV intact distally.   ASSESSMENT & PLAN:   1. Bilateral foot pain status post mechanical fall  We reviewed the XR of bilateral ankles and right foot demonstrate no fracture or dislocation. We have given patient a prescription for a seated rolling walker.  We have also provided her with a 56-month handicap placard to use when driving.  Recommended she continue the work-up with her rheumatologist and can follow-up with [place] on an as-needed basis.   Lanier Clam, DO, ATC Sports Medicine Fellow  Patient seen and evaluated with the sports medicine fellow.  I agree with the above plan of care.  X-rays show no evidence of fracture.  Prescription for a seated walker provided.  Of also given her a 30-month handicap placard for her chronic musculoskeletal issues.  Follow-up as needed.

## 2019-08-29 NOTE — Progress Notes (Signed)
d 

## 2019-09-04 ENCOUNTER — Telehealth: Payer: Self-pay | Admitting: Orthopaedic Surgery

## 2019-09-04 DIAGNOSIS — S93601A Unspecified sprain of right foot, initial encounter: Secondary | ICD-10-CM | POA: Diagnosis not present

## 2019-09-04 NOTE — Telephone Encounter (Signed)
Patient called. Was asking if she needs to come in for an appointment or if she can just have forms filled out for disability. Her call back number is [phone]

## 2019-09-04 NOTE — Telephone Encounter (Signed)
She needs to see [place] in the office

## 2019-09-05 MED FILL — CEQUA 0.09 % SOLN: 0.09 | 30 days supply | Qty: 60 | Fill #2

## 2019-09-06 DIAGNOSIS — L661 Lichen planopilaris: Secondary | ICD-10-CM | POA: Diagnosis not present

## 2019-09-06 DIAGNOSIS — Z79899 Other long term (current) drug therapy: Secondary | ICD-10-CM | POA: Diagnosis not present

## 2019-09-06 DIAGNOSIS — R262 Difficulty in walking, not elsewhere classified: Secondary | ICD-10-CM | POA: Diagnosis not present

## 2019-09-06 DIAGNOSIS — S93401D Sprain of unspecified ligament of right ankle, subsequent encounter: Secondary | ICD-10-CM | POA: Diagnosis not present

## 2019-09-06 DIAGNOSIS — M1990 Unspecified osteoarthritis, unspecified site: Secondary | ICD-10-CM | POA: Diagnosis not present

## 2019-09-06 DIAGNOSIS — M7989 Other specified soft tissue disorders: Secondary | ICD-10-CM | POA: Diagnosis not present

## 2019-09-06 DIAGNOSIS — R296 Repeated falls: Secondary | ICD-10-CM | POA: Diagnosis not present

## 2019-09-06 DIAGNOSIS — M6281 Muscle weakness (generalized): Secondary | ICD-10-CM | POA: Diagnosis not present

## 2019-09-06 DIAGNOSIS — M791 Myalgia, unspecified site: Secondary | ICD-10-CM | POA: Diagnosis not present

## 2019-09-06 DIAGNOSIS — M79643 Pain in unspecified hand: Secondary | ICD-10-CM | POA: Diagnosis not present

## 2019-09-06 DIAGNOSIS — M130 Polyarthritis, unspecified: Secondary | ICD-10-CM | POA: Diagnosis not present

## 2019-09-06 DIAGNOSIS — M549 Dorsalgia, unspecified: Secondary | ICD-10-CM | POA: Diagnosis not present

## 2019-09-06 DIAGNOSIS — M0609 Rheumatoid arthritis without rheumatoid factor, multiple sites: Secondary | ICD-10-CM | POA: Diagnosis not present

## 2019-09-06 MED FILL — predniSONE 5 MG (21) TBPK: 5 | 6 days supply | Qty: 21 | Fill #0

## 2019-09-06 MED FILL — DOXYCYCLINE HYC 50 MG CAP: 50 | 90 days supply | Qty: 90 | Fill #0

## 2019-09-13 ENCOUNTER — Encounter: Payer: Self-pay | Admitting: Orthopaedic Surgery

## 2019-09-13 ENCOUNTER — Other Ambulatory Visit: Payer: Self-pay

## 2019-09-13 ENCOUNTER — Ambulatory Visit (INDEPENDENT_AMBULATORY_CARE_PROVIDER_SITE_OTHER): Payer: PPO | Admitting: Orthopaedic Surgery

## 2019-09-13 ENCOUNTER — Telehealth: Payer: Self-pay | Admitting: Orthopaedic Surgery

## 2019-09-13 DIAGNOSIS — M25551 Pain in right hip: Secondary | ICD-10-CM | POA: Diagnosis not present

## 2019-09-13 DIAGNOSIS — Z8781 Personal history of (healed) traumatic fracture: Secondary | ICD-10-CM | POA: Diagnosis not present

## 2019-09-13 DIAGNOSIS — Z9889 Other specified postprocedural states: Secondary | ICD-10-CM | POA: Diagnosis not present

## 2019-09-13 MED ORDER — TRAMADOL HCL 50 MG PO TABS: 50.0000 mg | ORAL_TABLET | Freq: Four times a day (QID) | ORAL | 0 refills | Status: DC | PRN

## 2019-09-13 MED FILL — traMADol HCL 50 MG TABS: 50 | 3 days supply | Qty: 30 | Fill #0

## 2019-09-13 NOTE — Telephone Encounter (Signed)
Please advise 

## 2019-09-13 NOTE — Telephone Encounter (Signed)
Pt was checking out and said she forgot to ask for refill on her medication Tramadol, please have that sent to [facility] long outpatient.   [phone]

## 2019-09-13 NOTE — Progress Notes (Signed)
The patient is very pleasant 64 year old female who is 2-1/2 years out from cannulated screw fixation of a nondisplaced right femoral neck fracture.  She since healed the fracture and we confirmed this on plain films as well as CT scan and MRI.  Unfortunately she still has severe chronic pain as it relates to her right hip.  She was told in [place] that the hip is slightly malrotated and she states that someone recommended rotational osteotomy.  On exam though, she does not appear to have any significant malrotation.  This was a nondisplaced fracture that we fixed anatomically.  I was able to look at the CT scan again and even scalp films and her films do not show any clinically significant malrotation.  She walks with a considerable limp.  She has debilitating pain when I even attempted further her hip through any motion or examine her knees feet or ankle.  When I have her stand both knees have slight valgus malalignment but I do not see a severe malrotation as it relates to her feet which will be coming from the hip as well.  X-rays from just 2 months ago of the right hip show the fracture is healed completely and there is no evidence of osteonecrosis of the femoral head.  A mild loss of what else that I could recommend for her.  I do feel that if she wanted to pursue a hip replacement that I would have her see some other tertiary center for this given her body's response to pain.  It is unfortunate that this is caused such a debilitating condition for her.  I do agree with her being on disability because she cannot sit for long periods of time without pain, she cannot stand for long periods without having severe pain.  She cannot walk any distance without pain.  She cannot even drive due to the severity of her pain.  She is very tearful in the office as she describes this to me and I am absolutely generalizing her pain due to her body's response to this.  I am at a loss what else that I can provide for her.   When we do have any paperwork as it pertains to her full body disability I am happy to fill this out for her.  I do not see her being able to participate in any meaningful manual or sedentary labor for the next 1 to 3 years.

## 2019-09-14 MED FILL — FINASTERIDE 5 MG TABLET: 5 | 28 days supply | Qty: 7 | Fill #0

## 2019-09-15 DIAGNOSIS — R296 Repeated falls: Secondary | ICD-10-CM | POA: Diagnosis not present

## 2019-09-15 DIAGNOSIS — S93401D Sprain of unspecified ligament of right ankle, subsequent encounter: Secondary | ICD-10-CM | POA: Diagnosis not present

## 2019-09-15 DIAGNOSIS — R262 Difficulty in walking, not elsewhere classified: Secondary | ICD-10-CM | POA: Diagnosis not present

## 2019-09-15 DIAGNOSIS — M6281 Muscle weakness (generalized): Secondary | ICD-10-CM | POA: Diagnosis not present

## 2019-09-15 MED FILL — METHOTREXATE SODIUM 2.5 MG: 2.5 | 28 days supply | Qty: 28 | Fill #0

## 2019-09-20 ENCOUNTER — Encounter: Payer: Self-pay | Admitting: Neurology

## 2019-09-20 ENCOUNTER — Other Ambulatory Visit: Payer: Self-pay

## 2019-09-20 ENCOUNTER — Ambulatory Visit (INDEPENDENT_AMBULATORY_CARE_PROVIDER_SITE_OTHER): Payer: PPO | Admitting: Neurology

## 2019-09-20 VITALS — BP 110/67 | HR 81 | Temp 98.2°F | Ht 66.0 in | Wt 157.0 lb

## 2019-09-20 DIAGNOSIS — M06032 Rheumatoid arthritis without rheumatoid factor, left wrist: Secondary | ICD-10-CM

## 2019-09-20 DIAGNOSIS — G7001 Myasthenia gravis with (acute) exacerbation: Secondary | ICD-10-CM

## 2019-09-20 DIAGNOSIS — G7000 Myasthenia gravis without (acute) exacerbation: Secondary | ICD-10-CM | POA: Diagnosis not present

## 2019-09-20 DIAGNOSIS — G4733 Obstructive sleep apnea (adult) (pediatric): Secondary | ICD-10-CM

## 2019-09-20 DIAGNOSIS — D0510 Intraductal carcinoma in situ of unspecified breast: Secondary | ICD-10-CM

## 2019-09-20 DIAGNOSIS — Z9989 Dependence on other enabling machines and devices: Secondary | ICD-10-CM | POA: Diagnosis not present

## 2019-09-20 DIAGNOSIS — M06031 Rheumatoid arthritis without rheumatoid factor, right wrist: Secondary | ICD-10-CM | POA: Diagnosis not present

## 2019-09-20 DIAGNOSIS — Z8669 Personal history of other diseases of the nervous system and sense organs: Secondary | ICD-10-CM | POA: Diagnosis not present

## 2019-09-20 NOTE — Patient Instructions (Signed)
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
## 2019-09-21 DIAGNOSIS — Z23 Encounter for immunization: Secondary | ICD-10-CM | POA: Diagnosis not present

## 2019-09-21 DIAGNOSIS — L661 Lichen planopilaris: Secondary | ICD-10-CM | POA: Diagnosis not present

## 2019-09-21 DIAGNOSIS — B353 Tinea pedis: Secondary | ICD-10-CM | POA: Diagnosis not present

## 2019-09-21 MED FILL — FOLIC ACID 1 MG TABS: 1 | 30 days supply | Qty: 60 | Fill #0

## 2019-09-21 MED FILL — KETOCONAZOLE 2% CREAM: 2 | 15 days supply | Qty: 60 | Fill #0

## 2019-09-22 DIAGNOSIS — R296 Repeated falls: Secondary | ICD-10-CM | POA: Diagnosis not present

## 2019-09-22 DIAGNOSIS — R262 Difficulty in walking, not elsewhere classified: Secondary | ICD-10-CM | POA: Diagnosis not present

## 2019-09-22 DIAGNOSIS — M6281 Muscle weakness (generalized): Secondary | ICD-10-CM | POA: Diagnosis not present

## 2019-09-22 DIAGNOSIS — S93401D Sprain of unspecified ligament of right ankle, subsequent encounter: Secondary | ICD-10-CM | POA: Diagnosis not present

## 2019-09-25 ENCOUNTER — Ambulatory Visit: Payer: 59 | Admitting: Neurology

## 2019-09-25 DIAGNOSIS — R262 Difficulty in walking, not elsewhere classified: Secondary | ICD-10-CM | POA: Diagnosis not present

## 2019-09-25 DIAGNOSIS — M6281 Muscle weakness (generalized): Secondary | ICD-10-CM | POA: Diagnosis not present

## 2019-09-25 DIAGNOSIS — R296 Repeated falls: Secondary | ICD-10-CM | POA: Diagnosis not present

## 2019-09-25 DIAGNOSIS — S93401D Sprain of unspecified ligament of right ankle, subsequent encounter: Secondary | ICD-10-CM | POA: Diagnosis not present

## 2019-09-27 DIAGNOSIS — R296 Repeated falls: Secondary | ICD-10-CM | POA: Diagnosis not present

## 2019-09-27 DIAGNOSIS — R262 Difficulty in walking, not elsewhere classified: Secondary | ICD-10-CM | POA: Diagnosis not present

## 2019-09-27 DIAGNOSIS — G4733 Obstructive sleep apnea (adult) (pediatric): Secondary | ICD-10-CM | POA: Diagnosis not present

## 2019-09-27 DIAGNOSIS — M6281 Muscle weakness (generalized): Secondary | ICD-10-CM | POA: Diagnosis not present

## 2019-09-27 DIAGNOSIS — S93401D Sprain of unspecified ligament of right ankle, subsequent encounter: Secondary | ICD-10-CM | POA: Diagnosis not present

## 2019-10-02 DIAGNOSIS — R296 Repeated falls: Secondary | ICD-10-CM | POA: Diagnosis not present

## 2019-10-02 DIAGNOSIS — R262 Difficulty in walking, not elsewhere classified: Secondary | ICD-10-CM | POA: Diagnosis not present

## 2019-10-02 DIAGNOSIS — M6281 Muscle weakness (generalized): Secondary | ICD-10-CM | POA: Diagnosis not present

## 2019-10-02 DIAGNOSIS — G4733 Obstructive sleep apnea (adult) (pediatric): Secondary | ICD-10-CM | POA: Diagnosis not present

## 2019-10-02 DIAGNOSIS — S93401D Sprain of unspecified ligament of right ankle, subsequent encounter: Secondary | ICD-10-CM | POA: Diagnosis not present

## 2019-10-03 ENCOUNTER — Telehealth: Payer: Self-pay

## 2019-10-03 DIAGNOSIS — Z9989 Dependence on other enabling machines and devices: Secondary | ICD-10-CM

## 2019-10-03 DIAGNOSIS — G4733 Obstructive sleep apnea (adult) (pediatric): Secondary | ICD-10-CM

## 2019-10-03 NOTE — Telephone Encounter (Signed)
Called the patient and made her aware that I would make the change to her new CPAP from 5-15 cm water pressure to 4-12 cm water pressure. Verbal order was given by Dr Brett Fairy and order will be sent to [facility] for them to update for her. Pt verbalized understanding and was appreciative.

## 2019-10-03 NOTE — Telephone Encounter (Signed)
States she is having  issues with the cpap machine prescription and would prefer to discuss with RN directly.   Please follow up.

## 2019-10-04 DIAGNOSIS — M1990 Unspecified osteoarthritis, unspecified site: Secondary | ICD-10-CM | POA: Diagnosis not present

## 2019-10-04 DIAGNOSIS — M0609 Rheumatoid arthritis without rheumatoid factor, multiple sites: Secondary | ICD-10-CM | POA: Diagnosis not present

## 2019-10-04 DIAGNOSIS — M549 Dorsalgia, unspecified: Secondary | ICD-10-CM | POA: Diagnosis not present

## 2019-10-04 DIAGNOSIS — Z79899 Other long term (current) drug therapy: Secondary | ICD-10-CM | POA: Diagnosis not present

## 2019-10-04 DIAGNOSIS — M7989 Other specified soft tissue disorders: Secondary | ICD-10-CM | POA: Diagnosis not present

## 2019-10-04 DIAGNOSIS — L661 Lichen planopilaris: Secondary | ICD-10-CM | POA: Diagnosis not present

## 2019-10-04 DIAGNOSIS — M79643 Pain in unspecified hand: Secondary | ICD-10-CM | POA: Diagnosis not present

## 2019-10-04 DIAGNOSIS — M791 Myalgia, unspecified site: Secondary | ICD-10-CM | POA: Diagnosis not present

## 2019-10-04 DIAGNOSIS — M6250 Muscle wasting and atrophy, not elsewhere classified, unspecified site: Secondary | ICD-10-CM | POA: Diagnosis not present

## 2019-10-04 DIAGNOSIS — M130 Polyarthritis, unspecified: Secondary | ICD-10-CM | POA: Diagnosis not present

## 2019-10-05 DIAGNOSIS — H11823 Conjunctivochalasis, bilateral: Secondary | ICD-10-CM | POA: Diagnosis not present

## 2019-10-05 DIAGNOSIS — G7000 Myasthenia gravis without (acute) exacerbation: Secondary | ICD-10-CM | POA: Diagnosis not present

## 2019-10-05 DIAGNOSIS — H16223 Keratoconjunctivitis sicca, not specified as Sjogren's, bilateral: Secondary | ICD-10-CM | POA: Diagnosis not present

## 2019-10-05 MED FILL — LOTEPREDNOL ETABONATE 0.5 %: 0.5 | 25 days supply | Qty: 10 | Fill #0

## 2019-10-05 MED FILL — ERYTHROMYCIN EYE OINTMENT: 5 | 7 days supply | Qty: 4 | Fill #0

## 2019-10-05 MED FILL — LEUCOVORIN CALCIUM 5 MG TAB: 5 | 35 days supply | Qty: 5 | Fill #0

## 2019-10-06 MED FILL — METHOTREXATE SODIUM 2.5 MG: 2.5 | 35 days supply | Qty: 45 | Fill #0

## 2019-10-18 DIAGNOSIS — M898X7 Other specified disorders of bone, ankle and foot: Secondary | ICD-10-CM | POA: Diagnosis not present

## 2019-10-20 DIAGNOSIS — S93401D Sprain of unspecified ligament of right ankle, subsequent encounter: Secondary | ICD-10-CM | POA: Diagnosis not present

## 2019-10-20 DIAGNOSIS — R262 Difficulty in walking, not elsewhere classified: Secondary | ICD-10-CM | POA: Diagnosis not present

## 2019-10-20 DIAGNOSIS — R296 Repeated falls: Secondary | ICD-10-CM | POA: Diagnosis not present

## 2019-10-20 DIAGNOSIS — M6281 Muscle weakness (generalized): Secondary | ICD-10-CM | POA: Diagnosis not present

## 2019-10-23 DIAGNOSIS — S93401D Sprain of unspecified ligament of right ankle, subsequent encounter: Secondary | ICD-10-CM | POA: Diagnosis not present

## 2019-10-23 DIAGNOSIS — R262 Difficulty in walking, not elsewhere classified: Secondary | ICD-10-CM | POA: Diagnosis not present

## 2019-10-23 DIAGNOSIS — M6281 Muscle weakness (generalized): Secondary | ICD-10-CM | POA: Diagnosis not present

## 2019-10-23 DIAGNOSIS — R296 Repeated falls: Secondary | ICD-10-CM | POA: Diagnosis not present

## 2019-10-23 MED FILL — FINASTERIDE 5 MG TABLET: 5 | 28 days supply | Qty: 7 | Fill #0

## 2019-10-25 MED FILL — GABAPENTIN 300 MG CAPSULE: 300 | 30 days supply | Qty: 90 | Fill #0

## 2019-10-26 ENCOUNTER — Other Ambulatory Visit: Payer: PPO

## 2019-10-28 DIAGNOSIS — G4733 Obstructive sleep apnea (adult) (pediatric): Secondary | ICD-10-CM | POA: Diagnosis not present

## 2019-10-30 DIAGNOSIS — R262 Difficulty in walking, not elsewhere classified: Secondary | ICD-10-CM | POA: Diagnosis not present

## 2019-10-30 DIAGNOSIS — M6281 Muscle weakness (generalized): Secondary | ICD-10-CM | POA: Diagnosis not present

## 2019-10-30 DIAGNOSIS — S93401D Sprain of unspecified ligament of right ankle, subsequent encounter: Secondary | ICD-10-CM | POA: Diagnosis not present

## 2019-10-30 DIAGNOSIS — R296 Repeated falls: Secondary | ICD-10-CM | POA: Diagnosis not present

## 2019-11-01 DIAGNOSIS — S93401D Sprain of unspecified ligament of right ankle, subsequent encounter: Secondary | ICD-10-CM | POA: Diagnosis not present

## 2019-11-01 DIAGNOSIS — R262 Difficulty in walking, not elsewhere classified: Secondary | ICD-10-CM | POA: Diagnosis not present

## 2019-11-01 DIAGNOSIS — R296 Repeated falls: Secondary | ICD-10-CM | POA: Diagnosis not present

## 2019-11-01 DIAGNOSIS — M6281 Muscle weakness (generalized): Secondary | ICD-10-CM | POA: Diagnosis not present

## 2019-11-07 DIAGNOSIS — B353 Tinea pedis: Secondary | ICD-10-CM | POA: Diagnosis not present

## 2019-11-08 MED FILL — NAFTIN 2 % GEL: 2 | 14 days supply | Qty: 45 | Fill #0

## 2019-11-10 MED FILL — CLOBETASOL 0.05% SOLUTION: 0.05 | 30 days supply | Qty: 50 | Fill #0

## 2019-11-14 DIAGNOSIS — Z23 Encounter for immunization: Secondary | ICD-10-CM | POA: Diagnosis not present

## 2019-11-15 DIAGNOSIS — M1990 Unspecified osteoarthritis, unspecified site: Secondary | ICD-10-CM | POA: Diagnosis not present

## 2019-11-15 DIAGNOSIS — M7989 Other specified soft tissue disorders: Secondary | ICD-10-CM | POA: Diagnosis not present

## 2019-11-15 DIAGNOSIS — M79643 Pain in unspecified hand: Secondary | ICD-10-CM | POA: Diagnosis not present

## 2019-11-15 DIAGNOSIS — M549 Dorsalgia, unspecified: Secondary | ICD-10-CM | POA: Diagnosis not present

## 2019-11-15 DIAGNOSIS — L661 Lichen planopilaris: Secondary | ICD-10-CM | POA: Diagnosis not present

## 2019-11-15 DIAGNOSIS — M0609 Rheumatoid arthritis without rheumatoid factor, multiple sites: Secondary | ICD-10-CM | POA: Diagnosis not present

## 2019-11-15 DIAGNOSIS — M791 Myalgia, unspecified site: Secondary | ICD-10-CM | POA: Diagnosis not present

## 2019-11-15 DIAGNOSIS — M130 Polyarthritis, unspecified: Secondary | ICD-10-CM | POA: Diagnosis not present

## 2019-11-15 DIAGNOSIS — Z79899 Other long term (current) drug therapy: Secondary | ICD-10-CM | POA: Diagnosis not present

## 2019-11-16 ENCOUNTER — Other Ambulatory Visit: Payer: Self-pay

## 2019-11-16 ENCOUNTER — Ambulatory Visit
Admission: RE | Admit: 2019-11-16 | Discharge: 2019-11-16 | Disposition: A | Discharge status: Home / Self Care | Payer: PPO | Source: Ambulatory Visit | Attending: Geriatric Medicine | Admitting: Geriatric Medicine

## 2019-11-16 DIAGNOSIS — M8588 Other specified disorders of bone density and structure, other site: Secondary | ICD-10-CM | POA: Diagnosis not present

## 2019-11-16 DIAGNOSIS — M810 Age-related osteoporosis without current pathological fracture: Secondary | ICD-10-CM | POA: Diagnosis not present

## 2019-11-16 DIAGNOSIS — Z780 Asymptomatic menopausal state: Secondary | ICD-10-CM | POA: Diagnosis not present

## 2019-11-16 DIAGNOSIS — M8580 Other specified disorders of bone density and structure, unspecified site: Secondary | ICD-10-CM

## 2019-11-16 MED FILL — METHOTREXATE 25 MG/ML VIAL: 50 | 28 days supply | Qty: 4 | Fill #0

## 2019-11-16 MED FILL — sulfaSALAzine 500 MG TABS: 500 | 33 days supply | Qty: 60 | Fill #0

## 2019-11-17 MED FILL — GABAPENTIN 300 MG CAPSULE: 300 | 30 days supply | Qty: 180 | Fill #0

## 2019-11-25 DIAGNOSIS — G4733 Obstructive sleep apnea (adult) (pediatric): Secondary | ICD-10-CM | POA: Diagnosis not present

## 2019-11-29 MED FILL — FINASTERIDE 5 MG TABLET: 5 | 28 days supply | Qty: 7 | Fill #1

## 2019-11-29 MED FILL — DOXYCYCLINE HYC 50 MG CAP: 50 | 90 days supply | Qty: 90 | Fill #1

## 2019-11-29 MED FILL — LEUCOVORIN CALCIUM 5 MG TAB: 5 | 35 days supply | Qty: 5 | Fill #1

## 2019-12-01 DIAGNOSIS — R197 Diarrhea, unspecified: Secondary | ICD-10-CM | POA: Diagnosis not present

## 2019-12-01 DIAGNOSIS — M810 Age-related osteoporosis without current pathological fracture: Secondary | ICD-10-CM | POA: Diagnosis not present

## 2019-12-06 DIAGNOSIS — H16223 Keratoconjunctivitis sicca, not specified as Sjogren's, bilateral: Secondary | ICD-10-CM | POA: Diagnosis not present

## 2019-12-06 DIAGNOSIS — H40012 Open angle with borderline findings, low risk, left eye: Secondary | ICD-10-CM | POA: Diagnosis not present

## 2019-12-06 DIAGNOSIS — H16212 Exposure keratoconjunctivitis, left eye: Secondary | ICD-10-CM | POA: Diagnosis not present

## 2019-12-06 DIAGNOSIS — H468 Other optic neuritis: Secondary | ICD-10-CM | POA: Diagnosis not present

## 2019-12-07 DIAGNOSIS — M0609 Rheumatoid arthritis without rheumatoid factor, multiple sites: Secondary | ICD-10-CM | POA: Diagnosis not present

## 2019-12-11 ENCOUNTER — Encounter: Payer: Self-pay | Admitting: Neurology

## 2019-12-11 MED FILL — sulfaSALAzine 500 MG TABS: 500 | 30 days supply | Qty: 60 | Fill #0

## 2019-12-11 MED FILL — METHOTREXATE 25 MG/ML VIAL: 50 | 28 days supply | Qty: 4 | Fill #0

## 2019-12-12 DIAGNOSIS — Z23 Encounter for immunization: Secondary | ICD-10-CM | POA: Diagnosis not present

## 2019-12-13 ENCOUNTER — Other Ambulatory Visit: Payer: Self-pay

## 2019-12-13 ENCOUNTER — Ambulatory Visit: Payer: PPO | Admitting: Neurology

## 2019-12-13 ENCOUNTER — Encounter: Payer: Self-pay | Admitting: Neurology

## 2019-12-13 VITALS — BP 104/69 | HR 83 | Temp 96.9°F | Ht 66.0 in | Wt 154.0 lb

## 2019-12-13 DIAGNOSIS — M06031 Rheumatoid arthritis without rheumatoid factor, right wrist: Secondary | ICD-10-CM

## 2019-12-13 DIAGNOSIS — M06032 Rheumatoid arthritis without rheumatoid factor, left wrist: Secondary | ICD-10-CM

## 2019-12-13 DIAGNOSIS — G7001 Myasthenia gravis with (acute) exacerbation: Secondary | ICD-10-CM

## 2019-12-13 DIAGNOSIS — Z9989 Dependence on other enabling machines and devices: Secondary | ICD-10-CM

## 2019-12-13 DIAGNOSIS — D0510 Intraductal carcinoma in situ of unspecified breast: Secondary | ICD-10-CM | POA: Diagnosis not present

## 2019-12-13 DIAGNOSIS — G4733 Obstructive sleep apnea (adult) (pediatric): Secondary | ICD-10-CM | POA: Diagnosis not present

## 2019-12-13 DIAGNOSIS — Z8669 Personal history of other diseases of the nervous system and sense organs: Secondary | ICD-10-CM

## 2019-12-13 MED ORDER — SUNOSI 150 MG PO TABS: 150.0000 mg | ORAL_TABLET | Freq: Every day | ORAL | 5 refills | Status: DC

## 2019-12-13 NOTE — Addendum Note (Signed)
Addended by: Larey Seat on: 12/13/2019 03:21 PM   Modules accepted: Orders
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
## 2019-12-18 DIAGNOSIS — M65341 Trigger finger, right ring finger: Secondary | ICD-10-CM | POA: Diagnosis not present

## 2019-12-18 DIAGNOSIS — M65342 Trigger finger, left ring finger: Secondary | ICD-10-CM | POA: Diagnosis not present

## 2019-12-20 DIAGNOSIS — Z23 Encounter for immunization: Secondary | ICD-10-CM | POA: Diagnosis not present

## 2019-12-20 DIAGNOSIS — L661 Lichen planopilaris: Secondary | ICD-10-CM | POA: Diagnosis not present

## 2019-12-20 DIAGNOSIS — B353 Tinea pedis: Secondary | ICD-10-CM | POA: Diagnosis not present

## 2019-12-20 MED FILL — CLOBETASOL 0.05% SOLUTION: 0.05 | 30 days supply | Qty: 50 | Fill #1

## 2019-12-21 MED FILL — NAFTIN 2 % GEL: 2 | 28 days supply | Qty: 60 | Fill #0

## 2019-12-26 DIAGNOSIS — G4733 Obstructive sleep apnea (adult) (pediatric): Secondary | ICD-10-CM | POA: Diagnosis not present

## 2019-12-28 ENCOUNTER — Telehealth: Payer: Self-pay | Admitting: *Deleted

## 2019-12-28 DIAGNOSIS — Z9011 Acquired absence of right breast and nipple: Secondary | ICD-10-CM | POA: Diagnosis not present

## 2019-12-28 DIAGNOSIS — C50912 Malignant neoplasm of unspecified site of left female breast: Secondary | ICD-10-CM | POA: Diagnosis not present

## 2019-12-28 NOTE — Telephone Encounter (Addendum)
Received DME Standard Written Order via of fax from [facility].  Requesting signature and return.  Given to provider to sign.  Order signed and faxed to [facility].  Confirmation received.  Copy scanned into the chart.//AB/CMA

## 2020-01-02 DIAGNOSIS — G4733 Obstructive sleep apnea (adult) (pediatric): Secondary | ICD-10-CM | POA: Diagnosis not present

## 2020-01-04 MED FILL — METHOTREXATE 25 MG/ML VIAL: 50 | 28 days supply | Qty: 4 | Fill #1

## 2020-01-04 MED FILL — LEUCOVORIN CALCIUM 5 MG TAB: 5 | 35 days supply | Qty: 5 | Fill #2

## 2020-01-04 MED FILL — SULFASALAZINE 500 MG TABS: 500 | 30 days supply | Qty: 60 | Fill #1

## 2020-01-04 MED FILL — DESVENLAFAXINE SUC ER 50 MG: 50 | 90 days supply | Qty: 90 | Fill #2

## 2020-01-04 MED FILL — FINASTERIDE 5 MG TABLET: 5 | 28 days supply | Qty: 7 | Fill #0

## 2020-01-11 DIAGNOSIS — M130 Polyarthritis, unspecified: Secondary | ICD-10-CM | POA: Diagnosis not present

## 2020-01-11 DIAGNOSIS — M791 Myalgia, unspecified site: Secondary | ICD-10-CM | POA: Diagnosis not present

## 2020-01-11 DIAGNOSIS — Z79899 Other long term (current) drug therapy: Secondary | ICD-10-CM | POA: Diagnosis not present

## 2020-01-11 DIAGNOSIS — L661 Lichen planopilaris: Secondary | ICD-10-CM | POA: Diagnosis not present

## 2020-01-11 DIAGNOSIS — M0609 Rheumatoid arthritis without rheumatoid factor, multiple sites: Secondary | ICD-10-CM | POA: Diagnosis not present

## 2020-01-11 DIAGNOSIS — M79643 Pain in unspecified hand: Secondary | ICD-10-CM | POA: Diagnosis not present

## 2020-01-11 DIAGNOSIS — M1990 Unspecified osteoarthritis, unspecified site: Secondary | ICD-10-CM | POA: Diagnosis not present

## 2020-01-11 DIAGNOSIS — M7989 Other specified soft tissue disorders: Secondary | ICD-10-CM | POA: Diagnosis not present

## 2020-01-11 DIAGNOSIS — M549 Dorsalgia, unspecified: Secondary | ICD-10-CM | POA: Diagnosis not present

## 2020-01-11 MED FILL — FOLIC ACID 1 MG TABS: 1 | 30 days supply | Qty: 30 | Fill #0

## 2020-01-11 MED FILL — BD TB SYRINGE 21GX1: 21G X 1" | 28 days supply | Qty: 4 | Fill #0

## 2020-01-15 ENCOUNTER — Other Ambulatory Visit (HOSPITAL_COMMUNITY): Payer: Self-pay | Admitting: Rheumatology

## 2020-01-17 DIAGNOSIS — M65342 Trigger finger, left ring finger: Secondary | ICD-10-CM | POA: Diagnosis not present

## 2020-01-20 IMAGING — CR RIGHT RIBS - 2 VIEW
4 series · 4 of 4 positions shown · non-contrast
Comparison: None.

CLINICAL DATA: Fall against desk 5 days prior. Acute right lower
chest wall pain. Progression over the last 3 days.

EXAM:
RIGHT RIBS - 2 VIEW

[w chest pa]
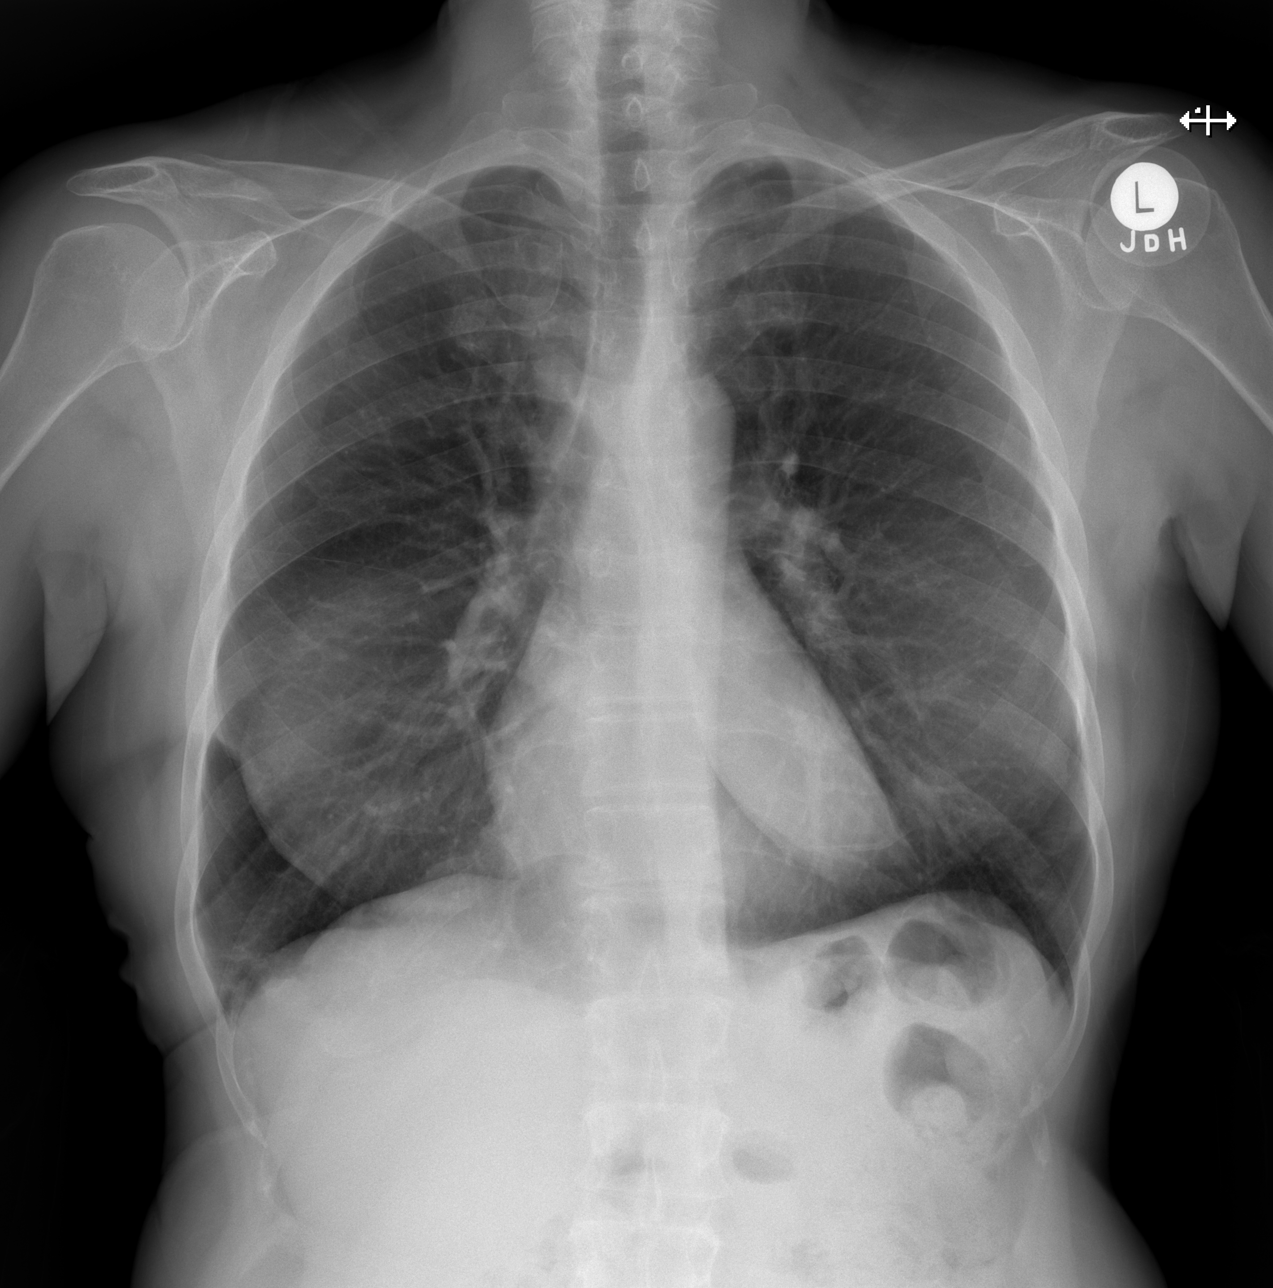

[w ribs ap lower right]
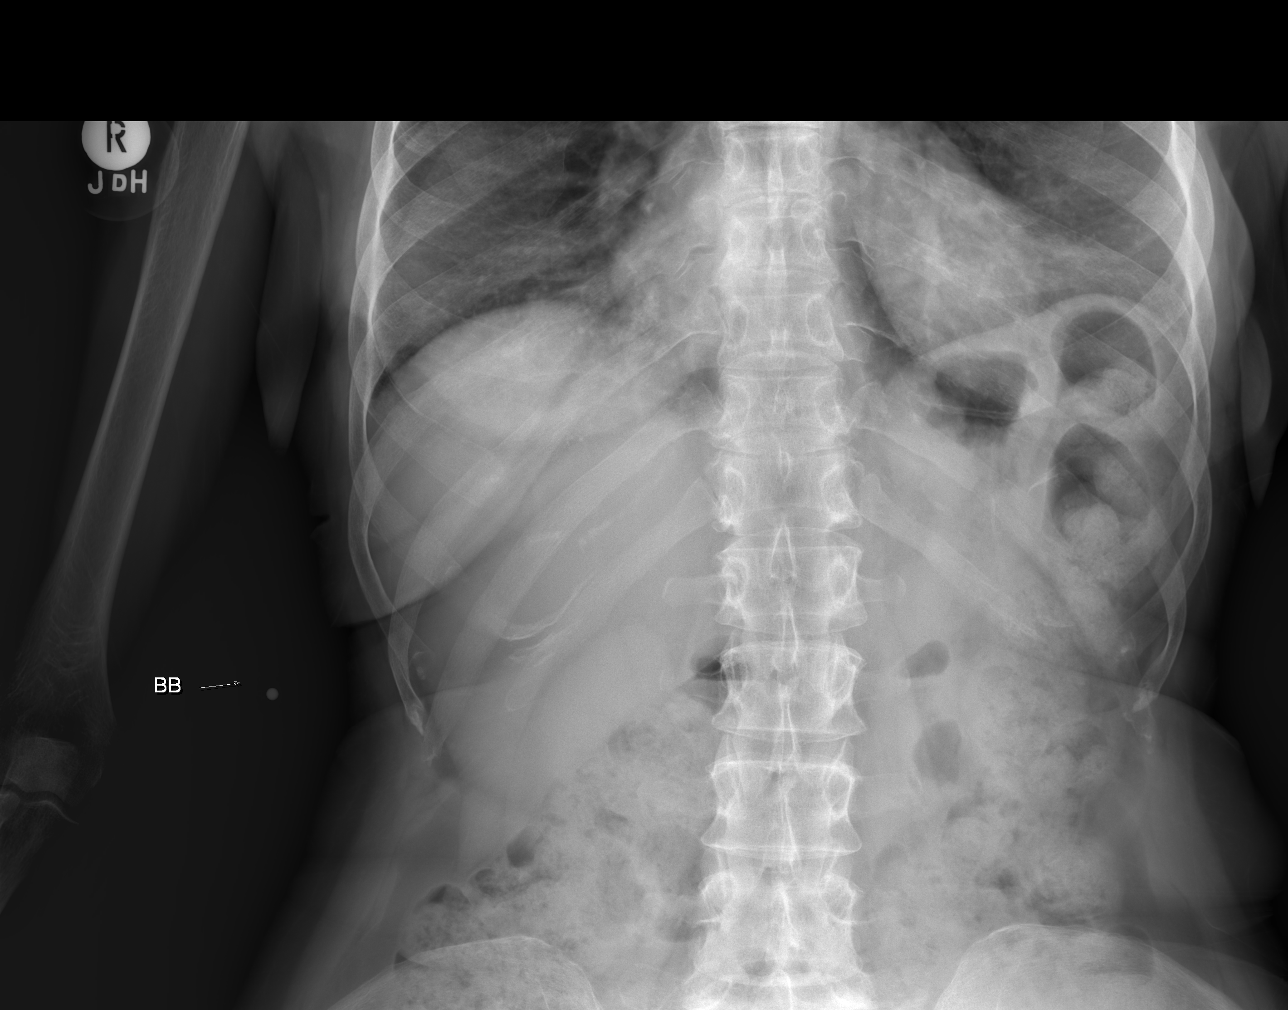

[w ribs obl right (1 of 2)]
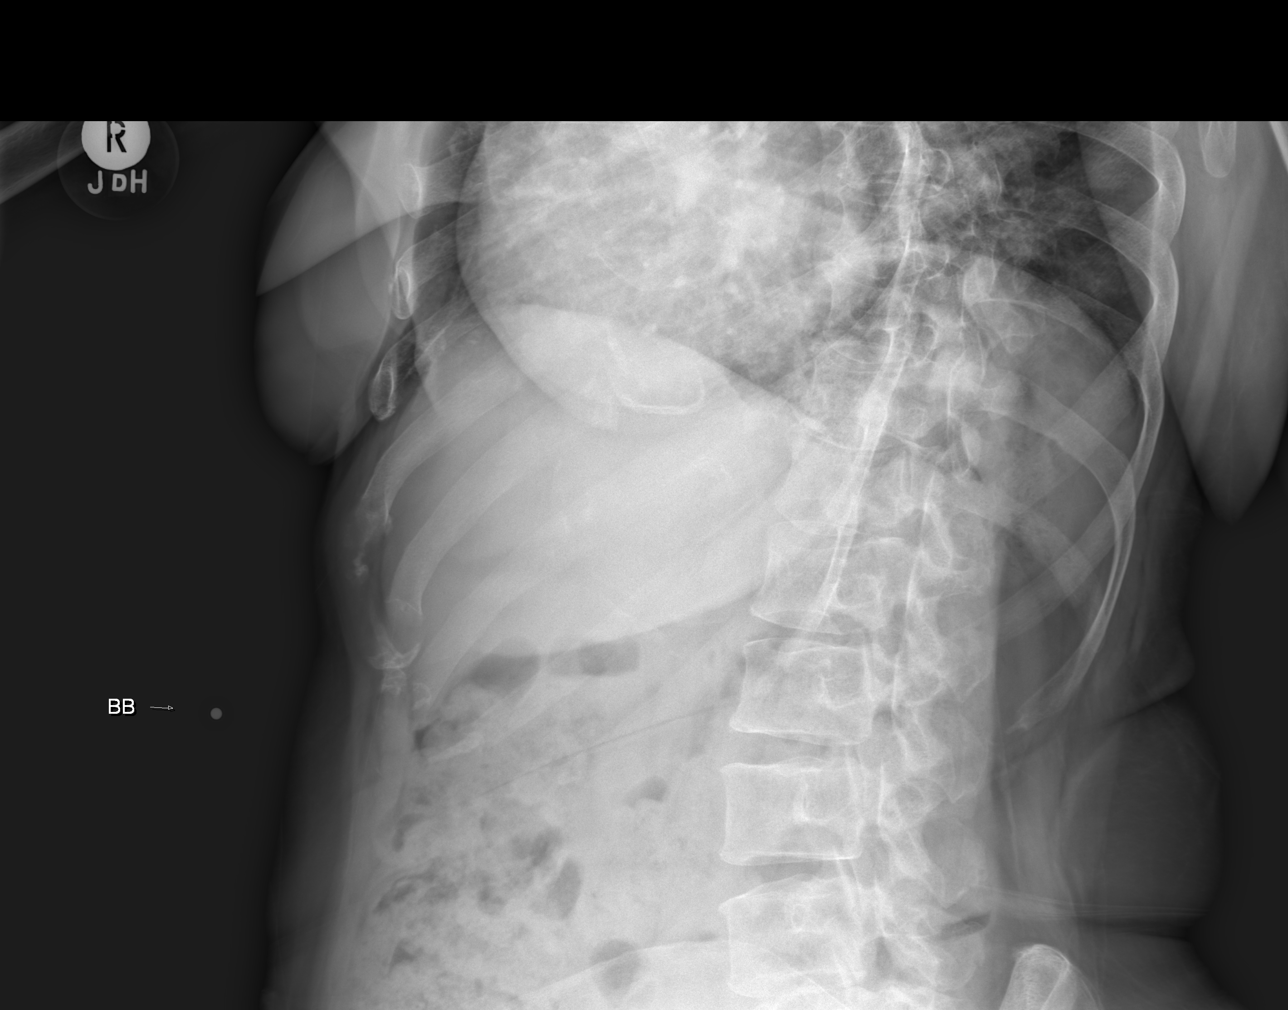

[w ribs obl right (2 of 2)]
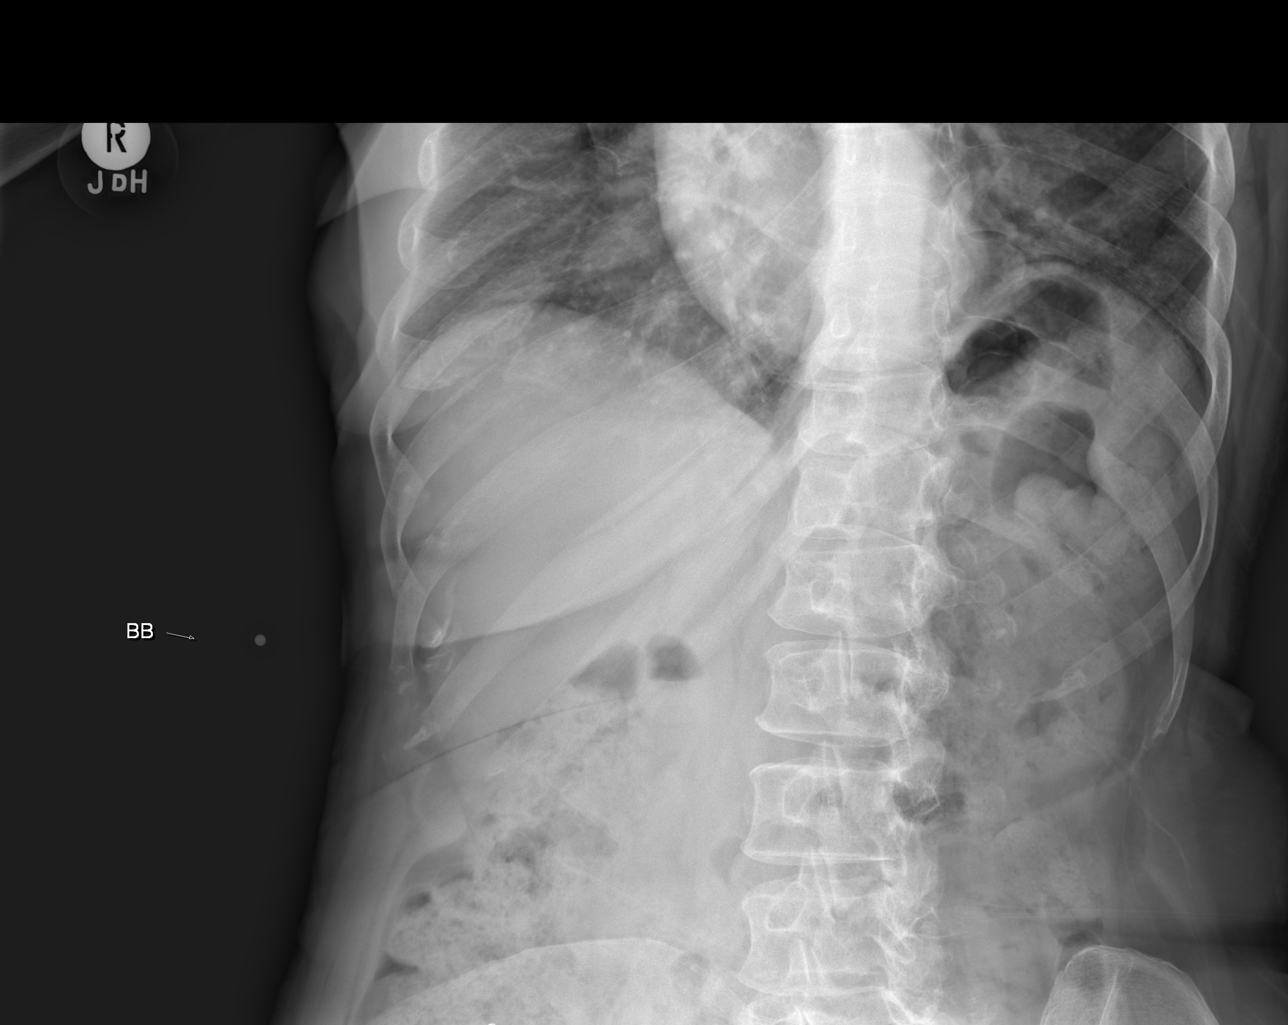

[4 of 4 positions shown; findings below may reference images not displayed]

FINDINGS: The heart size is normal. There is no edema or effusion. No focal
airspace disease is present. The visualized soft tissues and bony
thorax are unremarkable.

Dedicated imaging of the right ribs demonstrates no acute or healing
fracture. There is no pneumothorax.
IMPRESSION: Negative radiographs of the chest and right ribs.

## 2020-01-22 MED FILL — AMOXICILLIN 500 MG CAPSULE: 500 | 2 days supply | Qty: 8 | Fill #0

## 2020-01-22 MED FILL — FOLIC ACID 1 MG TABS: 1 | 30 days supply | Qty: 60 | Fill #0

## 2020-01-25 DIAGNOSIS — G4733 Obstructive sleep apnea (adult) (pediatric): Secondary | ICD-10-CM | POA: Diagnosis not present

## 2020-02-01 MED FILL — GABAPENTIN 300 MG CAPSULE: 300 | 30 days supply | Qty: 180 | Fill #1

## 2020-02-01 MED FILL — NAFTIN 2 % GEL: 2 | 14 days supply | Qty: 45 | Fill #0

## 2020-02-01 MED FILL — SULFASALAZINE 500 MG TABS: 500 | 30 days supply | Qty: 60 | Fill #1

## 2020-02-01 MED FILL — METHOTREXATE 25 MG/ML VIAL: 50 | 28 days supply | Qty: 4 | Fill #2

## 2020-02-01 MED FILL — BD TB SYRINGE 21GX1: 21G X 1" | 28 days supply | Qty: 4 | Fill #1

## 2020-02-01 MED FILL — FINASTERIDE 5 MG TABLET: 5 | 28 days supply | Qty: 7 | Fill #0

## 2020-02-01 MED FILL — BD NEEDLES 30GX0.5: 30G X 1/2" | 28 days supply | Qty: 4 | Fill #1

## 2020-02-08 ENCOUNTER — Other Ambulatory Visit: Payer: Self-pay | Admitting: Gastroenterology

## 2020-02-08 ENCOUNTER — Other Ambulatory Visit (HOSPITAL_COMMUNITY): Payer: Self-pay | Admitting: Gastroenterology

## 2020-02-08 DIAGNOSIS — K219 Gastro-esophageal reflux disease without esophagitis: Secondary | ICD-10-CM | POA: Diagnosis not present

## 2020-02-08 DIAGNOSIS — R1084 Generalized abdominal pain: Secondary | ICD-10-CM

## 2020-02-08 DIAGNOSIS — R634 Abnormal weight loss: Secondary | ICD-10-CM | POA: Diagnosis not present

## 2020-02-08 DIAGNOSIS — K582 Mixed irritable bowel syndrome: Secondary | ICD-10-CM | POA: Diagnosis not present

## 2020-02-08 DIAGNOSIS — R194 Change in bowel habit: Secondary | ICD-10-CM | POA: Diagnosis not present

## 2020-02-12 DIAGNOSIS — R634 Abnormal weight loss: Secondary | ICD-10-CM | POA: Diagnosis not present

## 2020-02-12 DIAGNOSIS — K582 Mixed irritable bowel syndrome: Secondary | ICD-10-CM | POA: Diagnosis not present

## 2020-02-12 DIAGNOSIS — K219 Gastro-esophageal reflux disease without esophagitis: Secondary | ICD-10-CM | POA: Diagnosis not present

## 2020-02-12 DIAGNOSIS — R194 Change in bowel habit: Secondary | ICD-10-CM | POA: Diagnosis not present

## 2020-02-15 ENCOUNTER — Ambulatory Visit (HOSPITAL_COMMUNITY): Payer: PPO

## 2020-02-16 ENCOUNTER — Other Ambulatory Visit: Payer: Self-pay | Admitting: Gastroenterology

## 2020-02-16 ENCOUNTER — Other Ambulatory Visit (HOSPITAL_COMMUNITY): Payer: Self-pay | Admitting: Gastroenterology

## 2020-02-16 DIAGNOSIS — R1310 Dysphagia, unspecified: Secondary | ICD-10-CM

## 2020-02-19 ENCOUNTER — Other Ambulatory Visit (HOSPITAL_COMMUNITY): Payer: Self-pay | Admitting: Gastroenterology

## 2020-02-19 DIAGNOSIS — R1310 Dysphagia, unspecified: Secondary | ICD-10-CM

## 2020-02-20 ENCOUNTER — Ambulatory Visit (HOSPITAL_COMMUNITY)
Admission: RE | Admit: 2020-02-20 | Discharge: 2020-02-20 | Disposition: A | Discharge status: Home / Self Care | Payer: PPO | Source: Ambulatory Visit | Attending: Gastroenterology | Admitting: Gastroenterology

## 2020-02-20 ENCOUNTER — Other Ambulatory Visit: Payer: Self-pay

## 2020-02-20 DIAGNOSIS — Z01419 Encounter for gynecological examination (general) (routine) without abnormal findings: Secondary | ICD-10-CM | POA: Diagnosis not present

## 2020-02-20 DIAGNOSIS — M810 Age-related osteoporosis without current pathological fracture: Secondary | ICD-10-CM | POA: Diagnosis not present

## 2020-02-20 DIAGNOSIS — R1084 Generalized abdominal pain: Secondary | ICD-10-CM | POA: Diagnosis not present

## 2020-02-20 DIAGNOSIS — R110 Nausea: Secondary | ICD-10-CM | POA: Diagnosis not present

## 2020-02-20 LAB — POCT I-STAT CREATININE: Creatinine, Ser: 0.5 mg/dL (ref 0.44–1.00)

## 2020-02-20 MED ORDER — IOHEXOL 9 MG/ML PO SOLN: 1000.0000 mL | ORAL | Status: AC

## 2020-02-20 MED ORDER — SODIUM CHLORIDE (PF) 0.9 % IJ SOLN
INTRAMUSCULAR | Status: AC
  Filled 2020-02-20: qty 50

## 2020-02-20 MED ORDER — IOHEXOL 300 MG/ML  SOLN
100.0000 mL | Freq: Once | INTRAMUSCULAR | Status: AC | PRN
  Administered 2020-02-20: 100 mL via INTRAVENOUS

## 2020-02-20 MED ORDER — IOHEXOL 9 MG/ML PO SOLN
ORAL | Status: AC
  Filled 2020-02-20: qty 1000

## 2020-02-23 ENCOUNTER — Other Ambulatory Visit: Payer: Self-pay

## 2020-02-23 ENCOUNTER — Ambulatory Visit (HOSPITAL_COMMUNITY)
Admission: RE | Admit: 2020-02-23 | Discharge: 2020-02-23 | Disposition: A | Discharge status: Home / Self Care | Payer: PPO | Source: Ambulatory Visit | Attending: Gastroenterology | Admitting: Gastroenterology

## 2020-02-23 DIAGNOSIS — R1310 Dysphagia, unspecified: Secondary | ICD-10-CM | POA: Insufficient documentation

## 2020-02-23 DIAGNOSIS — K219 Gastro-esophageal reflux disease without esophagitis: Secondary | ICD-10-CM | POA: Diagnosis not present

## 2020-02-25 DIAGNOSIS — G4733 Obstructive sleep apnea (adult) (pediatric): Secondary | ICD-10-CM | POA: Diagnosis not present

## 2020-02-27 ENCOUNTER — Other Ambulatory Visit (HOSPITAL_COMMUNITY): Payer: Self-pay | Admitting: Obstetrics and Gynecology

## 2020-02-28 ENCOUNTER — Encounter: Payer: Self-pay | Admitting: Family Medicine

## 2020-02-28 ENCOUNTER — Other Ambulatory Visit: Payer: Self-pay

## 2020-02-28 ENCOUNTER — Ambulatory Visit: Payer: PPO | Admitting: Family Medicine

## 2020-02-28 DIAGNOSIS — M79604 Pain in right leg: Secondary | ICD-10-CM | POA: Diagnosis not present

## 2020-02-28 DIAGNOSIS — M25551 Pain in right hip: Secondary | ICD-10-CM

## 2020-02-28 MED ORDER — TRAMADOL HCL 50 MG PO TABS: 50.0000 mg | ORAL_TABLET | Freq: Four times a day (QID) | ORAL | 0 refills | Status: DC | PRN

## 2020-02-28 MED FILL — PROLIA 60 MG/ML SOLN: 60 | 180 days supply | Qty: 1 | Fill #0

## 2020-02-28 MED FILL — traMADol HCL 50 MG TABS: 50 | 4 days supply | Qty: 30 | Fill #0

## 2020-02-28 MED FILL — DOXYCYCLINE HYC 50 MG CAP: 50 | 90 days supply | Qty: 90 | Fill #2

## 2020-02-28 NOTE — Progress Notes (Signed)
Office Visit Note   Patient: Felicia Cain           Date of Birth: 1955-10-10           MRN: [id number] Visit Date: 02/28/2020 Requested by: Lajean Manes, MD [address] [place],  [place] [address] PCP: Lajean Manes, MD  Subjective: Chief Complaint  Patient presents with  . Right Hip - Pain    Pain in groin and down anterior leg. She is very "unstable" with walking. Fell last week. Cannot put weight on just the rt leg > 20 seconds without pain in the groin - that is new for her. The left leg cramps up & gets weak.    HPI: She is here with right leg pain.  Longstanding problems with her hip and leg status post fracture ORIF in 2018.  She has learned to deal with her pain, but last week she stumbled and fell forward hitting her lower leg against something.  She did not fall to the ground, but she had immediate pain in her lower leg.  About 18 hours later she started noticing increasing pain in her right groin area.  Since then she has not been able to put full weight on her leg for more than 20 seconds without having to shift positions.  Now her left leg is starting to hurt and cramp intermittently.  In the past she has done well with physical therapy for this type of pain, and she is interested in trying that again.  She also takes tramadol rarely, and would like a refill of that.  She has seronegative rheumatoid arthritis that affects her hands.  She also has myasthenia gravis.                ROS:   All other systems were reviewed and are negative.  Objective: Vital Signs: There were no vitals taken for this visit.  Physical Exam:  General:  Alert and oriented, in no acute distress. Pulm:  Breathing unlabored. Psy:  Normal mood, congruent affect. Skin: There is some bruising on her right lateral calf.  No skin breakdown. Right leg: She has substantial pain with passive flexion and internal rotation.  She has pain with hip flexion and adduction against  resistance.  She has slight weakness as well.  Knee extension, foot dorsiflexion, eversion and plantar flexion strength are normal.  Imaging: None today   Assessment & Plan: 1.  Right greater than left leg pain 1 week status post fall with aggravation of chronic hip pain.  Now experiencing left leg pain from favoring the right. -We will try physical therapy at [facility].  Question whether laser treatments might help her as well.     Procedures: No procedures performed  No notes on file     PMFS History: Patient Active Problem List   Diagnosis Date Noted  . 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 ([place]) 09/20/2019  . Peroneal neuropathy at knee, left 03/19/2019  . Patellar contusion, left, sequela 02/09/2019  . Rib pain on right side 11/24/2018  . Acquired absence of both breasts 09/20/2018  . Ruptured right breast implant 08/26/2018  . History of reconstruction of both breasts 07/08/2018  . Breast implant capsular contracture 07/08/2018  . Breast pain 07/08/2018  . 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 Status post-operative repair of closed fracture of right hip 09/08/2017  . Closed right hip fracture, sequela 08/03/2017  . Acute pain  of right knee 05/24/2017  . Hip fracture ([place]) 01/25/2017  . Closed left hip fracture ([place]) 01/25/2017  . Closed hip fracture, right, initial encounter ([place])   . Preop examination   . Fibromyalgia   . Neuropathy   . Corneal abrasion   . Nausea   . Fall   . SIRS (systemic inflammatory response syndrome) ([place]) 07/19/2015  . Seronegative myasthenia gravis ([place]) 07/19/2015  . Acute exacerbation of chronic low back pain 07/19/2015  . Leukocytosis 07/19/2015  . Myasthenia gravis in remission ([place] ) 10/23/2014  . OSA on CPAP 10/23/2014  . Hypersomnia, persistent 10/23/2014  . Neurogenic hypoventilation 08/14/2014  . Hypoxemia 08/14/2014  . Myasthenia gravis with  exacerbation ([place]) 08/14/2014  . Myasthenia gravis with exacerbation, ocular ([place]) 12/08/2012  . Right shoulder pain 08/02/2012  . 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  . History of optic neuritis 06/27/2012  . Rosacea 06/27/2012  . S/P laminectomy 06/27/2012  . 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 S/P bilateral mastectomy 06/26/2012  . BRCA negative 06/26/2012  . GERD (gastroesophageal reflux disease) 06/26/2012  . Menopause 06/26/2012  . Ankle pain 06/02/2012   Past Medical History:  Diagnosis Date  . Anxiety   . Arthritis    "back" (01/25/2017)  . Brachial neuritis    neuropathy  . Chronic lower back pain   . DCIS (ductal carcinoma in situ)    "left side?"  . Esophageal stricture    stricture with dysphasia  . Fibromyalgia   . GERD (gastroesophageal reflux disease)   . H/O Doppler ultrasound 2006   see scanned study  . H/O echocardiogram 2004, 2013  . Hepatitis A 1961   "epidemic in [place] city"  . History of cardiac monitoring 2006  . History of nuclear stress test 2004   see scanned study  . Migraine    "in the past; cycle related" (01/25/2017)  . Myasthenia gravis in crisis [facility]) 04/2014   respiratory crisis  . NAION (non-arteritic anterior ischemic optic neuropathy), right   . Near syncope   . Neuromuscular disorder ([place])   . Neuropathy   . Optic neuritis    ischaemic optic neuritis, non arteric  . Optic neuropathy   . PONV (postoperative nausea and vomiting)   . Posterior optic neuritis   . Ptosis of eyelid   . Sleep apnea    "I wear Moses device; I don't wear CPAP" (01/25/2017)  . Spinal stenosis     Family History  Problem Relation Age of Onset  . Stroke Mother   . Hypertension Mother   . Early death Father   . Cancer Father 89       sarcoma  . Parkinson's disease Maternal Aunt   . Cancer Maternal Uncle   . Cancer Paternal Grandmother        breast    Past Surgical History:  Procedure Laterality Date  . AUGMENTATION MAMMAPLASTY     2000,  after breast cancer surgery,redone 2010  . BACK SURGERY    . BREAST BIOPSY    . BREAST CAPSULECTOMY WITH IMPLANT EXCHANGE Bilateral 09/28/2018   Procedure: REMOVAL OF BILATERAL BREAST IMPLANTS WITH CAPSULECTOMIES AND REPLACEMENTS OF IMPLANTS;  Surgeon: Wallace Going, DO;  Location: [facility];  Service: Plastics;  Laterality: Bilateral;  . CARPAL TUNNEL RELEASE Bilateral   . CESAREAN SECTION  1989  . ESOPHAGOGASTRODUODENOSCOPY (EGD) WITH ESOPHAGEAL DILATION    . HIP PINNING,CANNULATED Right 01/26/2017   Procedure: CANNULATED SCREWS/RIGHT HIP PINNING;  Surgeon: Jean Rosenthal  Y, MD;  Location: [facility];  Service: Orthopedics;  Laterality: Right;  . INNER EAR SURGERY Right 1995  . LUMBAR LAMINECTOMY Right 1999   [name]  . MASTECTOMY Bilateral    Social History   Occupational History  . Not on file  Tobacco Use  . Smoking status: Never Smoker  . Smokeless tobacco: Never Used  Vaping Use  . Vaping Use: Never used  Substance and Sexual Activity  . Alcohol use: No  . Drug use: No  . Sexual activity: Not Currently

## 2020-03-05 DIAGNOSIS — I809 Phlebitis and thrombophlebitis of unspecified site: Secondary | ICD-10-CM | POA: Diagnosis not present

## 2020-03-05 DIAGNOSIS — L661 Lichen planopilaris: Secondary | ICD-10-CM | POA: Diagnosis not present

## 2020-03-05 DIAGNOSIS — M810 Age-related osteoporosis without current pathological fracture: Secondary | ICD-10-CM | POA: Diagnosis not present

## 2020-03-05 DIAGNOSIS — B353 Tinea pedis: Secondary | ICD-10-CM | POA: Diagnosis not present

## 2020-03-05 MED FILL — NAFTIN 2 % GEL: 2 | 30 days supply | Qty: 60 | Fill #0

## 2020-03-12 MED FILL — SULFASALAZINE 500 MG TABS: 500 | 30 days supply | Qty: 60 | Fill #2

## 2020-03-12 MED FILL — FINASTERIDE 5 MG TABLET: 5 | 28 days supply | Qty: 7 | Fill #0

## 2020-03-12 MED FILL — FOLIC ACID 1 MG TABS: 1 | 30 days supply | Qty: 60 | Fill #1

## 2020-03-14 DIAGNOSIS — M0609 Rheumatoid arthritis without rheumatoid factor, multiple sites: Secondary | ICD-10-CM | POA: Diagnosis not present

## 2020-03-14 DIAGNOSIS — M130 Polyarthritis, unspecified: Secondary | ICD-10-CM | POA: Diagnosis not present

## 2020-03-14 DIAGNOSIS — Z79899 Other long term (current) drug therapy: Secondary | ICD-10-CM | POA: Diagnosis not present

## 2020-03-14 DIAGNOSIS — B353 Tinea pedis: Secondary | ICD-10-CM | POA: Diagnosis not present

## 2020-03-14 DIAGNOSIS — L661 Lichen planopilaris: Secondary | ICD-10-CM | POA: Diagnosis not present

## 2020-03-14 DIAGNOSIS — M549 Dorsalgia, unspecified: Secondary | ICD-10-CM | POA: Diagnosis not present

## 2020-03-14 DIAGNOSIS — M79643 Pain in unspecified hand: Secondary | ICD-10-CM | POA: Diagnosis not present

## 2020-03-14 DIAGNOSIS — M1990 Unspecified osteoarthritis, unspecified site: Secondary | ICD-10-CM | POA: Diagnosis not present

## 2020-03-14 DIAGNOSIS — M791 Myalgia, unspecified site: Secondary | ICD-10-CM | POA: Diagnosis not present

## 2020-03-19 MED FILL — METHOTREXATE 25 MG/ML VIAL: 50 | 28 days supply | Qty: 4 | Fill #1

## 2020-03-19 MED FILL — ERYTHROMYCIN EYE OINTMENT: 5 | 7 days supply | Qty: 4 | Fill #1

## 2020-03-19 MED FILL — BD TB SYRINGE 21GX1: 21G X 1" | 28 days supply | Qty: 4 | Fill #2

## 2020-03-19 MED FILL — BD NEEDLES 30GX0.5: 30G X 1/2" | 28 days supply | Qty: 4 | Fill #2

## 2020-03-20 DIAGNOSIS — M79604 Pain in right leg: Secondary | ICD-10-CM | POA: Diagnosis not present

## 2020-03-20 DIAGNOSIS — M79605 Pain in left leg: Secondary | ICD-10-CM | POA: Diagnosis not present

## 2020-03-22 ENCOUNTER — Other Ambulatory Visit (HOSPITAL_COMMUNITY): Payer: Self-pay | Admitting: Rheumatology

## 2020-03-22 MED FILL — LEUCOVORIN CALCIUM 5 MG TAB: 5 | 84 days supply | Qty: 12 | Fill #0

## 2020-03-26 DIAGNOSIS — M79604 Pain in right leg: Secondary | ICD-10-CM | POA: Diagnosis not present

## 2020-03-26 DIAGNOSIS — G4733 Obstructive sleep apnea (adult) (pediatric): Secondary | ICD-10-CM | POA: Diagnosis not present

## 2020-03-26 DIAGNOSIS — M79605 Pain in left leg: Secondary | ICD-10-CM | POA: Diagnosis not present

## 2020-03-28 ENCOUNTER — Ambulatory Visit
Admission: RE | Admit: 2020-03-28 | Discharge: 2020-03-28 | Disposition: A | Discharge status: Home / Self Care | Payer: PPO | Source: Ambulatory Visit | Attending: Sports Medicine | Admitting: Sports Medicine

## 2020-03-28 ENCOUNTER — Other Ambulatory Visit: Payer: Self-pay

## 2020-03-28 DIAGNOSIS — M25532 Pain in left wrist: Secondary | ICD-10-CM

## 2020-03-28 DIAGNOSIS — S6992XA Unspecified injury of left wrist, hand and finger(s), initial encounter: Secondary | ICD-10-CM | POA: Diagnosis not present

## 2020-04-02 DIAGNOSIS — M79605 Pain in left leg: Secondary | ICD-10-CM | POA: Diagnosis not present

## 2020-04-02 DIAGNOSIS — M79604 Pain in right leg: Secondary | ICD-10-CM | POA: Diagnosis not present

## 2020-04-04 ENCOUNTER — Encounter: Payer: Self-pay | Admitting: Neurology

## 2020-04-13 MED FILL — FOLIC ACID 1 MG TABS: 1 | 30 days supply | Qty: 60 | Fill #2

## 2020-04-13 MED FILL — FINASTERIDE 5 MG TABLET: 5 | 28 days supply | Qty: 7 | Fill #1

## 2020-04-13 MED FILL — METHOTREXATE 25 MG/ML VIAL: 50 | 28 days supply | Qty: 4 | Fill #2

## 2020-04-13 MED FILL — sulfaSALAzine 500 MG TABS: 500 | 30 days supply | Qty: 60 | Fill #2

## 2020-04-18 MED FILL — NAFTIN 2 % GEL: 2 | 30 days supply | Qty: 60 | Fill #0

## 2020-04-22 ENCOUNTER — Other Ambulatory Visit (HOSPITAL_COMMUNITY): Payer: Self-pay | Admitting: Rheumatology

## 2020-04-22 MED FILL — BD NEEDLES 30GX0.5: 30G X 1/2" | 28 days supply | Qty: 4 | Fill #0

## 2020-04-23 ENCOUNTER — Other Ambulatory Visit: Payer: Self-pay

## 2020-04-23 ENCOUNTER — Ambulatory Visit: Payer: PPO | Admitting: Sports Medicine

## 2020-04-23 VITALS — BP 104/72 | Ht 65.5 in | Wt 145.0 lb

## 2020-04-23 DIAGNOSIS — M25532 Pain in left wrist: Secondary | ICD-10-CM

## 2020-04-24 ENCOUNTER — Encounter: Payer: Self-pay | Admitting: Sports Medicine

## 2020-04-24 NOTE — Progress Notes (Addendum)
   Subjective:    Patient ID: Felicia Cain, female    DOB: May 08, 1956, 64 y.o.   MRN: [id number]  HPI chief complaint: Left wrist pain  Patient comes in today complaining of approximately 6 to 8 weeks of left wrist pain.  Pain began after she fell on an outstretched hand.  Pain was initially in the wrist but now radiates up into the forearm.  X-rays done on July 22 showed no evidence of fracture.  She has continued to have pain primarily along the dorsum of the wrist.  She is having trouble holding objects.  He did initially have some bruising and swelling but that has subsided.    Review of Systems As above    Objective:   Physical Exam  Well-developed, well-nourished.  No acute distress  Left wrist: Patient does lack active and passive dorsiflexion on the left compared to the right.  She has only about 50% of active dorsiflexion on the left.  Ulnar and radial deviation are normal.  No effusion.  No soft tissue swelling.  She is tender to palpation both in the anatomic snuffbox as well as over the area of the scapholunate ligament.  Positive liftoff test.  Good pulses.  X-ray as above      Assessment & Plan:   Persistent left wrist pain status post [place] injury  There is concern here for possible occult scaphoid fracture versus scapholunate ligament tear.  Therefore, I would like to order an MRI arthrogram of the left wrist to evaluate further.  Phone follow-up with those results when available.  We will delineate further treatment based on those findings.  In the meantime, I recommended that she try simple body helix compression sleeve for comfort.

## 2020-04-26 DIAGNOSIS — G4733 Obstructive sleep apnea (adult) (pediatric): Secondary | ICD-10-CM | POA: Diagnosis not present

## 2020-04-26 DIAGNOSIS — F411 Generalized anxiety disorder: Secondary | ICD-10-CM | POA: Diagnosis not present

## 2020-05-01 DIAGNOSIS — F411 Generalized anxiety disorder: Secondary | ICD-10-CM | POA: Diagnosis not present

## 2020-05-07 ENCOUNTER — Ambulatory Visit
Admission: RE | Admit: 2020-05-07 | Discharge: 2020-05-07 | Disposition: A | Discharge status: Home / Self Care | Payer: PPO | Source: Ambulatory Visit | Attending: Sports Medicine | Admitting: Sports Medicine

## 2020-05-07 ENCOUNTER — Other Ambulatory Visit: Payer: Self-pay

## 2020-05-07 ENCOUNTER — Other Ambulatory Visit (HOSPITAL_COMMUNITY): Payer: Self-pay | Admitting: Dermatology

## 2020-05-07 DIAGNOSIS — M25532 Pain in left wrist: Secondary | ICD-10-CM | POA: Diagnosis not present

## 2020-05-07 MED ORDER — IOPAMIDOL (ISOVUE-M 200) INJECTION 41%
2.0000 mL | Freq: Once | INTRAMUSCULAR | Status: AC
  Administered 2020-05-07: 2 mL via INTRA_ARTICULAR

## 2020-05-07 MED FILL — CLOBETASOL 0.05% SOLUTION: 0.05 | 30 days supply | Qty: 50 | Fill #0

## 2020-05-07 MED FILL — FINASTERIDE 5 MG TABLET: 5 | 28 days supply | Qty: 7 | Fill #2

## 2020-05-08 DIAGNOSIS — R509 Fever, unspecified: Secondary | ICD-10-CM | POA: Diagnosis not present

## 2020-05-08 DIAGNOSIS — Z03818 Encounter for observation for suspected exposure to other biological agents ruled out: Secondary | ICD-10-CM | POA: Diagnosis not present

## 2020-05-08 DIAGNOSIS — R05 Cough: Secondary | ICD-10-CM | POA: Diagnosis not present

## 2020-05-08 MED FILL — PROMETHAZINE W/DM SYRUP: 6.25-15 | 6 days supply | Qty: 120 | Fill #0

## 2020-05-08 MED FILL — ALBUTEROL SULFATE HFA 108 (: 108 (90 BAS | 25 days supply | Qty: 9 | Fill #0

## 2020-05-09 ENCOUNTER — Telehealth: Payer: Self-pay | Admitting: Sports Medicine

## 2020-05-09 MED FILL — METHOTREXATE 25 MG/ML VIAL: 50 | 30 days supply | Qty: 4 | Fill #0

## 2020-05-09 MED FILL — FOLIC ACID 1 MG TABS: 1 | 30 days supply | Qty: 30 | Fill #0

## 2020-05-09 NOTE — Telephone Encounter (Signed)
  Patient notified that her recent left wrist MR arthrogram is unremarkable. Reassurance is offered. No further work-up or treatment recommended at this time.

## 2020-05-14 ENCOUNTER — Other Ambulatory Visit (HOSPITAL_COMMUNITY): Payer: Self-pay | Admitting: Student

## 2020-05-14 MED FILL — GABAPENTIN 300 MG CAPSULE: 300 | 30 days supply | Qty: 180 | Fill #0

## 2020-05-15 ENCOUNTER — Other Ambulatory Visit (HOSPITAL_COMMUNITY): Payer: Self-pay | Admitting: Rheumatology

## 2020-05-15 DIAGNOSIS — F411 Generalized anxiety disorder: Secondary | ICD-10-CM | POA: Diagnosis not present

## 2020-05-15 MED FILL — sulfaSALAzine 500 MG TABS: 500 | 30 days supply | Qty: 60 | Fill #0

## 2020-05-20 ENCOUNTER — Other Ambulatory Visit: Payer: Self-pay | Admitting: Neurology

## 2020-05-20 ENCOUNTER — Telehealth: Payer: Self-pay | Admitting: Neurology

## 2020-05-20 ENCOUNTER — Ambulatory Visit: Payer: PPO | Admitting: Neurology

## 2020-05-20 ENCOUNTER — Encounter: Payer: Self-pay | Admitting: Neurology

## 2020-05-20 VITALS — BP 117/71 | HR 86 | Ht 66.0 in | Wt 151.0 lb

## 2020-05-20 DIAGNOSIS — M06031 Rheumatoid arthritis without rheumatoid factor, right wrist: Secondary | ICD-10-CM

## 2020-05-20 DIAGNOSIS — M06032 Rheumatoid arthritis without rheumatoid factor, left wrist: Secondary | ICD-10-CM | POA: Diagnosis not present

## 2020-05-20 DIAGNOSIS — R29898 Other symptoms and signs involving the musculoskeletal system: Secondary | ICD-10-CM

## 2020-05-20 DIAGNOSIS — G7001 Myasthenia gravis with (acute) exacerbation: Secondary | ICD-10-CM

## 2020-05-20 DIAGNOSIS — G7000 Myasthenia gravis without (acute) exacerbation: Secondary | ICD-10-CM | POA: Diagnosis not present

## 2020-05-20 MED ORDER — MODAFINIL 100 MG PO TABS: 100.0000 mg | ORAL_TABLET | Freq: Every day | ORAL | 1 refills | Status: DC

## 2020-05-20 MED ORDER — PYRIDOSTIGMINE BROMIDE 60 MG PO TABS: ORAL_TABLET | ORAL | 5 refills | Status: DC

## 2020-05-20 MED FILL — FOLIC ACID 1 MG TABS: 1 | 90 days supply | Qty: 180 | Fill #0

## 2020-05-20 NOTE — Telephone Encounter (Signed)
Patient went next door to PT - they told her they couldnot transfer the referral - she wants to go to [facility] [phone]. Her best call back is [phone]

## 2020-05-20 NOTE — Patient Instructions (Signed)
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
## 2020-05-21 NOTE — Telephone Encounter (Signed)
Sent referral for PT telephone [phone]-fax [phone]

## 2020-05-22 ENCOUNTER — Other Ambulatory Visit (HOSPITAL_COMMUNITY): Payer: Self-pay | Admitting: Dermatology

## 2020-05-22 DIAGNOSIS — L853 Xerosis cutis: Secondary | ICD-10-CM | POA: Diagnosis not present

## 2020-05-22 DIAGNOSIS — F411 Generalized anxiety disorder: Secondary | ICD-10-CM | POA: Diagnosis not present

## 2020-05-22 DIAGNOSIS — L661 Lichen planopilaris: Secondary | ICD-10-CM | POA: Diagnosis not present

## 2020-05-24 DIAGNOSIS — H11823 Conjunctivochalasis, bilateral: Secondary | ICD-10-CM | POA: Diagnosis not present

## 2020-05-24 DIAGNOSIS — H5203 Hypermetropia, bilateral: Secondary | ICD-10-CM | POA: Diagnosis not present

## 2020-05-24 DIAGNOSIS — H2513 Age-related nuclear cataract, bilateral: Secondary | ICD-10-CM | POA: Diagnosis not present

## 2020-05-24 DIAGNOSIS — H40012 Open angle with borderline findings, low risk, left eye: Secondary | ICD-10-CM | POA: Diagnosis not present

## 2020-05-24 DIAGNOSIS — H524 Presbyopia: Secondary | ICD-10-CM | POA: Diagnosis not present

## 2020-05-24 DIAGNOSIS — H47291 Other optic atrophy, right eye: Secondary | ICD-10-CM | POA: Diagnosis not present

## 2020-05-24 DIAGNOSIS — H52223 Regular astigmatism, bilateral: Secondary | ICD-10-CM | POA: Diagnosis not present

## 2020-05-24 DIAGNOSIS — H30142 Acute posterior multifocal placoid pigment epitheliopathy, left eye: Secondary | ICD-10-CM | POA: Diagnosis not present

## 2020-05-24 DIAGNOSIS — H16223 Keratoconjunctivitis sicca, not specified as Sjogren's, bilateral: Secondary | ICD-10-CM | POA: Diagnosis not present

## 2020-05-29 DIAGNOSIS — F411 Generalized anxiety disorder: Secondary | ICD-10-CM | POA: Diagnosis not present

## 2020-05-29 MED FILL — FINASTERIDE 5 MG TABLET: 5 | 30 days supply | Qty: 15 | Fill #0

## 2020-05-29 MED FILL — DOXYCYCLINE HYC 50 MG CAP: 50 | 30 days supply | Qty: 30 | Fill #3

## 2020-05-30 DIAGNOSIS — Z20822 Contact with and (suspected) exposure to covid-19: Secondary | ICD-10-CM | POA: Diagnosis not present

## 2020-06-06 DIAGNOSIS — M6281 Muscle weakness (generalized): Secondary | ICD-10-CM | POA: Diagnosis not present

## 2020-06-06 DIAGNOSIS — M0689 Other specified rheumatoid arthritis, multiple sites: Secondary | ICD-10-CM | POA: Diagnosis not present

## 2020-06-06 DIAGNOSIS — G7000 Myasthenia gravis without (acute) exacerbation: Secondary | ICD-10-CM | POA: Diagnosis not present

## 2020-06-06 DIAGNOSIS — R262 Difficulty in walking, not elsewhere classified: Secondary | ICD-10-CM | POA: Diagnosis not present

## 2020-06-06 MED FILL — METHOTREXATE 25 MG/ML VIAL: 50 | 30 days supply | Qty: 4 | Fill #1

## 2020-06-07 MED FILL — sulfaSALAzine 500 MG TABS: 500 | 30 days supply | Qty: 60 | Fill #1

## 2020-06-10 MED FILL — BD NEEDLES 30GX0.5: 30G X 1/2" | 28 days supply | Qty: 4 | Fill #1

## 2020-06-13 ENCOUNTER — Ambulatory Visit: Payer: PPO | Admitting: Neurology

## 2020-06-13 DIAGNOSIS — R262 Difficulty in walking, not elsewhere classified: Secondary | ICD-10-CM | POA: Diagnosis not present

## 2020-06-13 DIAGNOSIS — M06839 Other specified rheumatoid arthritis, unspecified wrist: Secondary | ICD-10-CM | POA: Diagnosis not present

## 2020-06-13 DIAGNOSIS — G7000 Myasthenia gravis without (acute) exacerbation: Secondary | ICD-10-CM | POA: Diagnosis not present

## 2020-06-13 DIAGNOSIS — M6281 Muscle weakness (generalized): Secondary | ICD-10-CM | POA: Diagnosis not present

## 2020-06-14 DIAGNOSIS — Z23 Encounter for immunization: Secondary | ICD-10-CM | POA: Diagnosis not present

## 2020-06-14 DIAGNOSIS — R262 Difficulty in walking, not elsewhere classified: Secondary | ICD-10-CM | POA: Diagnosis not present

## 2020-06-14 DIAGNOSIS — G7000 Myasthenia gravis without (acute) exacerbation: Secondary | ICD-10-CM | POA: Diagnosis not present

## 2020-06-14 DIAGNOSIS — M06839 Other specified rheumatoid arthritis, unspecified wrist: Secondary | ICD-10-CM | POA: Diagnosis not present

## 2020-06-14 DIAGNOSIS — M6281 Muscle weakness (generalized): Secondary | ICD-10-CM | POA: Diagnosis not present

## 2020-06-17 DIAGNOSIS — R300 Dysuria: Secondary | ICD-10-CM | POA: Diagnosis not present

## 2020-06-18 DIAGNOSIS — Z79899 Other long term (current) drug therapy: Secondary | ICD-10-CM | POA: Diagnosis not present

## 2020-06-18 DIAGNOSIS — M130 Polyarthritis, unspecified: Secondary | ICD-10-CM | POA: Diagnosis not present

## 2020-06-18 DIAGNOSIS — L661 Lichen planopilaris: Secondary | ICD-10-CM | POA: Diagnosis not present

## 2020-06-18 DIAGNOSIS — R059 Cough, unspecified: Secondary | ICD-10-CM | POA: Diagnosis not present

## 2020-06-18 DIAGNOSIS — M1990 Unspecified osteoarthritis, unspecified site: Secondary | ICD-10-CM | POA: Diagnosis not present

## 2020-06-18 DIAGNOSIS — M79643 Pain in unspecified hand: Secondary | ICD-10-CM | POA: Diagnosis not present

## 2020-06-18 DIAGNOSIS — M549 Dorsalgia, unspecified: Secondary | ICD-10-CM | POA: Diagnosis not present

## 2020-06-18 DIAGNOSIS — M791 Myalgia, unspecified site: Secondary | ICD-10-CM | POA: Diagnosis not present

## 2020-06-18 DIAGNOSIS — M0609 Rheumatoid arthritis without rheumatoid factor, multiple sites: Secondary | ICD-10-CM | POA: Diagnosis not present

## 2020-06-19 DIAGNOSIS — F411 Generalized anxiety disorder: Secondary | ICD-10-CM | POA: Diagnosis not present

## 2020-06-25 ENCOUNTER — Other Ambulatory Visit (HOSPITAL_COMMUNITY): Payer: Self-pay | Admitting: Rheumatology

## 2020-06-25 DIAGNOSIS — R262 Difficulty in walking, not elsewhere classified: Secondary | ICD-10-CM | POA: Diagnosis not present

## 2020-06-25 DIAGNOSIS — M06839 Other specified rheumatoid arthritis, unspecified wrist: Secondary | ICD-10-CM | POA: Diagnosis not present

## 2020-06-25 DIAGNOSIS — G7000 Myasthenia gravis without (acute) exacerbation: Secondary | ICD-10-CM | POA: Diagnosis not present

## 2020-06-25 DIAGNOSIS — M6281 Muscle weakness (generalized): Secondary | ICD-10-CM | POA: Diagnosis not present

## 2020-06-25 MED FILL — BD NEEDLES 30GX0.5: 30G X 1/2" | 28 days supply | Qty: 4 | Fill #0

## 2020-06-25 MED FILL — BD TB SYRINGE 21GX1: 21G X 1" | 28 days supply | Qty: 4 | Fill #0

## 2020-06-25 MED FILL — BD NEEDLE 23GX1: 23G X 1" | 28 days supply | Qty: 4 | Fill #0

## 2020-06-28 DIAGNOSIS — M06839 Other specified rheumatoid arthritis, unspecified wrist: Secondary | ICD-10-CM | POA: Diagnosis not present

## 2020-06-28 DIAGNOSIS — R262 Difficulty in walking, not elsewhere classified: Secondary | ICD-10-CM | POA: Diagnosis not present

## 2020-06-28 DIAGNOSIS — M6281 Muscle weakness (generalized): Secondary | ICD-10-CM | POA: Diagnosis not present

## 2020-06-28 DIAGNOSIS — G7000 Myasthenia gravis without (acute) exacerbation: Secondary | ICD-10-CM | POA: Diagnosis not present

## 2020-07-01 DIAGNOSIS — F411 Generalized anxiety disorder: Secondary | ICD-10-CM | POA: Diagnosis not present

## 2020-07-02 MED FILL — METHOTREXATE 25 MG/ML VIAL: 50 | 30 days supply | Qty: 4 | Fill #2

## 2020-07-02 MED FILL — sulfaSALAzine 500 MG TABS: 500 | 30 days supply | Qty: 60 | Fill #2

## 2020-07-02 MED FILL — FINASTERIDE 5 MG TABLET: 5 | 30 days supply | Qty: 15 | Fill #1

## 2020-07-02 MED FILL — BD LUER-LOK SYR 3 ML 25GX5/: 25G X 5/8" | 84 days supply | Qty: 12 | Fill #0

## 2020-07-03 ENCOUNTER — Other Ambulatory Visit (HOSPITAL_COMMUNITY): Payer: Self-pay | Admitting: Ophthalmology

## 2020-07-03 MED FILL — DOXYCYCLINE HYC 50 MG CAP: 50 | 90 days supply | Qty: 90 | Fill #0

## 2020-07-03 MED FILL — CEQUA 0.09 % SOLN: 0.09 | 30 days supply | Qty: 60 | Fill #0

## 2020-07-05 DIAGNOSIS — M06839 Other specified rheumatoid arthritis, unspecified wrist: Secondary | ICD-10-CM | POA: Diagnosis not present

## 2020-07-05 DIAGNOSIS — R262 Difficulty in walking, not elsewhere classified: Secondary | ICD-10-CM | POA: Diagnosis not present

## 2020-07-05 DIAGNOSIS — G7000 Myasthenia gravis without (acute) exacerbation: Secondary | ICD-10-CM | POA: Diagnosis not present

## 2020-07-05 DIAGNOSIS — M6281 Muscle weakness (generalized): Secondary | ICD-10-CM | POA: Diagnosis not present

## 2020-07-08 ENCOUNTER — Other Ambulatory Visit (HOSPITAL_COMMUNITY): Payer: Self-pay | Admitting: Orthopedic Surgery

## 2020-07-08 DIAGNOSIS — M79642 Pain in left hand: Secondary | ICD-10-CM | POA: Diagnosis not present

## 2020-07-08 DIAGNOSIS — M79641 Pain in right hand: Secondary | ICD-10-CM | POA: Diagnosis not present

## 2020-07-08 MED FILL — MELOXICAM 7.5 MG TABLET: 7.5 | 30 days supply | Qty: 30 | Fill #0

## 2020-07-09 DIAGNOSIS — R262 Difficulty in walking, not elsewhere classified: Secondary | ICD-10-CM | POA: Diagnosis not present

## 2020-07-09 DIAGNOSIS — G7000 Myasthenia gravis without (acute) exacerbation: Secondary | ICD-10-CM | POA: Diagnosis not present

## 2020-07-09 DIAGNOSIS — M6281 Muscle weakness (generalized): Secondary | ICD-10-CM | POA: Diagnosis not present

## 2020-07-09 DIAGNOSIS — M06839 Other specified rheumatoid arthritis, unspecified wrist: Secondary | ICD-10-CM | POA: Diagnosis not present

## 2020-07-12 DIAGNOSIS — R262 Difficulty in walking, not elsewhere classified: Secondary | ICD-10-CM | POA: Diagnosis not present

## 2020-07-12 DIAGNOSIS — M6281 Muscle weakness (generalized): Secondary | ICD-10-CM | POA: Diagnosis not present

## 2020-07-12 DIAGNOSIS — M06839 Other specified rheumatoid arthritis, unspecified wrist: Secondary | ICD-10-CM | POA: Diagnosis not present

## 2020-07-12 DIAGNOSIS — G7000 Myasthenia gravis without (acute) exacerbation: Secondary | ICD-10-CM | POA: Diagnosis not present

## 2020-07-16 ENCOUNTER — Other Ambulatory Visit (HOSPITAL_COMMUNITY): Payer: Self-pay | Admitting: Rheumatology

## 2020-07-16 DIAGNOSIS — M1990 Unspecified osteoarthritis, unspecified site: Secondary | ICD-10-CM | POA: Diagnosis not present

## 2020-07-16 DIAGNOSIS — M6281 Muscle weakness (generalized): Secondary | ICD-10-CM | POA: Diagnosis not present

## 2020-07-16 DIAGNOSIS — I7300 Raynaud's syndrome without gangrene: Secondary | ICD-10-CM | POA: Diagnosis not present

## 2020-07-16 DIAGNOSIS — M549 Dorsalgia, unspecified: Secondary | ICD-10-CM | POA: Diagnosis not present

## 2020-07-16 DIAGNOSIS — R262 Difficulty in walking, not elsewhere classified: Secondary | ICD-10-CM | POA: Diagnosis not present

## 2020-07-16 DIAGNOSIS — Z79899 Other long term (current) drug therapy: Secondary | ICD-10-CM | POA: Diagnosis not present

## 2020-07-16 DIAGNOSIS — M130 Polyarthritis, unspecified: Secondary | ICD-10-CM | POA: Diagnosis not present

## 2020-07-16 DIAGNOSIS — M06839 Other specified rheumatoid arthritis, unspecified wrist: Secondary | ICD-10-CM | POA: Diagnosis not present

## 2020-07-16 DIAGNOSIS — M7989 Other specified soft tissue disorders: Secondary | ICD-10-CM | POA: Diagnosis not present

## 2020-07-16 DIAGNOSIS — G7000 Myasthenia gravis without (acute) exacerbation: Secondary | ICD-10-CM | POA: Diagnosis not present

## 2020-07-16 DIAGNOSIS — L661 Lichen planopilaris: Secondary | ICD-10-CM | POA: Diagnosis not present

## 2020-07-16 DIAGNOSIS — M0609 Rheumatoid arthritis without rheumatoid factor, multiple sites: Secondary | ICD-10-CM | POA: Diagnosis not present

## 2020-07-16 DIAGNOSIS — M79643 Pain in unspecified hand: Secondary | ICD-10-CM | POA: Diagnosis not present

## 2020-07-16 DIAGNOSIS — M791 Myalgia, unspecified site: Secondary | ICD-10-CM | POA: Diagnosis not present

## 2020-07-16 MED FILL — AMLODIPINE BESYLATE 2.5 MG: 2.5 | 30 days supply | Qty: 30 | Fill #0

## 2020-07-23 DIAGNOSIS — M6281 Muscle weakness (generalized): Secondary | ICD-10-CM | POA: Diagnosis not present

## 2020-07-23 DIAGNOSIS — G7000 Myasthenia gravis without (acute) exacerbation: Secondary | ICD-10-CM | POA: Diagnosis not present

## 2020-07-23 DIAGNOSIS — M06839 Other specified rheumatoid arthritis, unspecified wrist: Secondary | ICD-10-CM | POA: Diagnosis not present

## 2020-07-23 DIAGNOSIS — R262 Difficulty in walking, not elsewhere classified: Secondary | ICD-10-CM | POA: Diagnosis not present

## 2020-07-26 DIAGNOSIS — R262 Difficulty in walking, not elsewhere classified: Secondary | ICD-10-CM | POA: Diagnosis not present

## 2020-07-26 DIAGNOSIS — G7000 Myasthenia gravis without (acute) exacerbation: Secondary | ICD-10-CM | POA: Diagnosis not present

## 2020-07-26 DIAGNOSIS — M06839 Other specified rheumatoid arthritis, unspecified wrist: Secondary | ICD-10-CM | POA: Diagnosis not present

## 2020-07-26 DIAGNOSIS — M6281 Muscle weakness (generalized): Secondary | ICD-10-CM | POA: Diagnosis not present

## 2020-07-30 ENCOUNTER — Other Ambulatory Visit (HOSPITAL_COMMUNITY): Payer: Self-pay | Admitting: Dermatology

## 2020-07-30 DIAGNOSIS — L739 Follicular disorder, unspecified: Secondary | ICD-10-CM | POA: Diagnosis not present

## 2020-07-30 DIAGNOSIS — B353 Tinea pedis: Secondary | ICD-10-CM | POA: Diagnosis not present

## 2020-07-30 DIAGNOSIS — L661 Lichen planopilaris: Secondary | ICD-10-CM | POA: Diagnosis not present

## 2020-07-30 DIAGNOSIS — Z411 Encounter for cosmetic surgery: Secondary | ICD-10-CM | POA: Diagnosis not present

## 2020-07-30 MED FILL — TRETINOIN 0.1% CREAM: 0.1 | 30 days supply | Qty: 20 | Fill #0

## 2020-07-30 MED FILL — TACROLIMUS 0.1 % OINT: 0.1 | 30 days supply | Qty: 60 | Fill #0

## 2020-07-30 MED FILL — NAFTIN 2 % GEL: 2 | 30 days supply | Qty: 60 | Fill #0

## 2020-08-12 MED FILL — FINASTERIDE 5 MG TABLET: 5 | 30 days supply | Qty: 15 | Fill #2

## 2020-08-12 MED FILL — AMLODIPINE BESYLATE 2.5 MG: 2.5 | 30 days supply | Qty: 30 | Fill #1

## 2020-08-12 MED FILL — METHOTREXATE 25 MG/ML VIAL: 50 | 28 days supply | Qty: 4 | Fill #0

## 2020-08-13 ENCOUNTER — Other Ambulatory Visit (HOSPITAL_COMMUNITY): Payer: Self-pay | Admitting: Rheumatology

## 2020-08-13 DIAGNOSIS — M6281 Muscle weakness (generalized): Secondary | ICD-10-CM | POA: Diagnosis not present

## 2020-08-13 DIAGNOSIS — M06839 Other specified rheumatoid arthritis, unspecified wrist: Secondary | ICD-10-CM | POA: Diagnosis not present

## 2020-08-13 DIAGNOSIS — G7000 Myasthenia gravis without (acute) exacerbation: Secondary | ICD-10-CM | POA: Diagnosis not present

## 2020-08-13 DIAGNOSIS — R262 Difficulty in walking, not elsewhere classified: Secondary | ICD-10-CM | POA: Diagnosis not present

## 2020-08-13 MED FILL — CLOBETASOL 0.05% SOLUTION: 0.05 | 30 days supply | Qty: 50 | Fill #1

## 2020-08-13 MED FILL — sulfaSALAzine 500 MG TABS: 500 | 30 days supply | Qty: 60 | Fill #0

## 2020-08-26 ENCOUNTER — Other Ambulatory Visit (HOSPITAL_COMMUNITY): Payer: Self-pay | Admitting: Dermatology

## 2020-08-26 MED FILL — ERYTHROMYCIN EYE OINTMENT: 5 | 7 days supply | Qty: 4 | Fill #2

## 2020-08-27 ENCOUNTER — Other Ambulatory Visit (HOSPITAL_COMMUNITY): Payer: Self-pay | Admitting: Ophthalmology

## 2020-08-27 MED FILL — SILVADENE 1% CREAM: 1 | 7 days supply | Qty: 50 | Fill #0

## 2020-08-27 MED FILL — OLOPATADINE HCL 0.2 % SOLN: 0.2 | 25 days supply | Qty: 3 | Fill #0

## 2020-08-29 ENCOUNTER — Other Ambulatory Visit (HOSPITAL_COMMUNITY): Payer: Self-pay | Admitting: Dermatology

## 2020-08-29 MED FILL — UREA 40 % CREA: 40 | 20 days supply | Qty: 28 | Fill #0

## 2020-09-03 DIAGNOSIS — G7000 Myasthenia gravis without (acute) exacerbation: Secondary | ICD-10-CM | POA: Diagnosis not present

## 2020-09-03 DIAGNOSIS — M810 Age-related osteoporosis without current pathological fracture: Secondary | ICD-10-CM | POA: Diagnosis not present

## 2020-09-03 DIAGNOSIS — G250 Essential tremor: Secondary | ICD-10-CM | POA: Diagnosis not present

## 2020-09-03 DIAGNOSIS — Z79899 Other long term (current) drug therapy: Secondary | ICD-10-CM | POA: Diagnosis not present

## 2020-09-03 DIAGNOSIS — K219 Gastro-esophageal reflux disease without esophagitis: Secondary | ICD-10-CM | POA: Diagnosis not present

## 2020-09-03 DIAGNOSIS — Z0000 Encounter for general adult medical examination without abnormal findings: Secondary | ICD-10-CM | POA: Diagnosis not present

## 2020-09-03 DIAGNOSIS — Z1389 Encounter for screening for other disorder: Secondary | ICD-10-CM | POA: Diagnosis not present

## 2020-09-03 DIAGNOSIS — G629 Polyneuropathy, unspecified: Secondary | ICD-10-CM | POA: Diagnosis not present

## 2020-09-03 DIAGNOSIS — F331 Major depressive disorder, recurrent, moderate: Secondary | ICD-10-CM | POA: Diagnosis not present

## 2020-09-03 DIAGNOSIS — Z136 Encounter for screening for cardiovascular disorders: Secondary | ICD-10-CM | POA: Diagnosis not present

## 2020-09-04 DIAGNOSIS — M1990 Unspecified osteoarthritis, unspecified site: Secondary | ICD-10-CM | POA: Diagnosis not present

## 2020-09-04 DIAGNOSIS — M791 Myalgia, unspecified site: Secondary | ICD-10-CM | POA: Diagnosis not present

## 2020-09-04 DIAGNOSIS — M7989 Other specified soft tissue disorders: Secondary | ICD-10-CM | POA: Diagnosis not present

## 2020-09-04 DIAGNOSIS — M549 Dorsalgia, unspecified: Secondary | ICD-10-CM | POA: Diagnosis not present

## 2020-09-04 DIAGNOSIS — L661 Lichen planopilaris: Secondary | ICD-10-CM | POA: Diagnosis not present

## 2020-09-04 DIAGNOSIS — M79643 Pain in unspecified hand: Secondary | ICD-10-CM | POA: Diagnosis not present

## 2020-09-04 DIAGNOSIS — I7300 Raynaud's syndrome without gangrene: Secondary | ICD-10-CM | POA: Diagnosis not present

## 2020-09-04 DIAGNOSIS — Z79899 Other long term (current) drug therapy: Secondary | ICD-10-CM | POA: Diagnosis not present

## 2020-09-04 DIAGNOSIS — M130 Polyarthritis, unspecified: Secondary | ICD-10-CM | POA: Diagnosis not present

## 2020-09-04 DIAGNOSIS — M0609 Rheumatoid arthritis without rheumatoid factor, multiple sites: Secondary | ICD-10-CM | POA: Diagnosis not present

## 2020-09-05 ENCOUNTER — Other Ambulatory Visit (HOSPITAL_COMMUNITY): Payer: Self-pay | Admitting: Dermatology

## 2020-09-05 DIAGNOSIS — L409 Psoriasis, unspecified: Secondary | ICD-10-CM | POA: Diagnosis not present

## 2020-09-05 DIAGNOSIS — L304 Erythema intertrigo: Secondary | ICD-10-CM | POA: Diagnosis not present

## 2020-09-05 DIAGNOSIS — L710 Perioral dermatitis: Secondary | ICD-10-CM | POA: Diagnosis not present

## 2020-09-05 MED FILL — HYDROCORTISONE 2.5% OINT: 2.5 | 30 days supply | Qty: 57 | Fill #0

## 2020-09-05 MED FILL — TRIAMCINOLONE 0.1% CREAM: 0.1 | 11 days supply | Qty: 80 | Fill #0

## 2020-09-09 ENCOUNTER — Other Ambulatory Visit (HOSPITAL_COMMUNITY): Payer: Self-pay | Admitting: Rheumatology

## 2020-09-09 MED FILL — sulfaSALAzine 500 MG TABS: 500 | 30 days supply | Qty: 60 | Fill #0

## 2020-09-09 MED FILL — PROLIA 60 MG/ML SOLN: 60 | 180 days supply | Qty: 1 | Fill #1

## 2020-09-11 MED FILL — AMLODIPINE BESYLATE 2.5 MG: 2.5 | 30 days supply | Qty: 30 | Fill #2

## 2020-09-11 MED FILL — GABAPENTIN 300 MG CAPSULE: 300 | 30 days supply | Qty: 180 | Fill #1

## 2020-09-11 MED FILL — FINASTERIDE 5 MG TABLET: 5 | 30 days supply | Qty: 15 | Fill #3

## 2020-09-12 ENCOUNTER — Other Ambulatory Visit: Payer: PPO

## 2020-09-17 DIAGNOSIS — N952 Postmenopausal atrophic vaginitis: Secondary | ICD-10-CM | POA: Diagnosis not present

## 2020-09-17 DIAGNOSIS — R350 Frequency of micturition: Secondary | ICD-10-CM | POA: Diagnosis not present

## 2020-09-17 DIAGNOSIS — M810 Age-related osteoporosis without current pathological fracture: Secondary | ICD-10-CM | POA: Diagnosis not present

## 2020-09-19 DIAGNOSIS — Z23 Encounter for immunization: Secondary | ICD-10-CM | POA: Diagnosis not present

## 2020-10-02 ENCOUNTER — Other Ambulatory Visit (HOSPITAL_COMMUNITY): Payer: Self-pay | Admitting: Neurosurgery

## 2020-10-02 DIAGNOSIS — G7000 Myasthenia gravis without (acute) exacerbation: Secondary | ICD-10-CM | POA: Diagnosis not present

## 2020-10-02 DIAGNOSIS — R030 Elevated blood-pressure reading, without diagnosis of hypertension: Secondary | ICD-10-CM | POA: Diagnosis not present

## 2020-10-02 DIAGNOSIS — M545 Low back pain, unspecified: Secondary | ICD-10-CM | POA: Diagnosis not present

## 2020-10-02 DIAGNOSIS — M5416 Radiculopathy, lumbar region: Secondary | ICD-10-CM | POA: Diagnosis not present

## 2020-10-02 DIAGNOSIS — M4316 Spondylolisthesis, lumbar region: Secondary | ICD-10-CM | POA: Diagnosis not present

## 2020-10-04 ENCOUNTER — Other Ambulatory Visit: Payer: Self-pay | Admitting: Neurosurgery

## 2020-10-04 ENCOUNTER — Other Ambulatory Visit: Payer: Medicare Other

## 2020-10-04 DIAGNOSIS — Z20822 Contact with and (suspected) exposure to covid-19: Secondary | ICD-10-CM

## 2020-10-04 DIAGNOSIS — M4316 Spondylolisthesis, lumbar region: Secondary | ICD-10-CM

## 2020-10-06 LAB — SARS-COV-2, NAA 2 DAY TAT

## 2020-10-06 LAB — NOVEL CORONAVIRUS, NAA: SARS-CoV-2, NAA: NOT DETECTED

## 2020-10-07 ENCOUNTER — Telehealth: Payer: Self-pay | Admitting: General Practice

## 2020-10-07 MED FILL — PYRIDOSTIGMINE BR 60 MG TAB: 60 | 30 days supply | Qty: 90 | Fill #0

## 2020-10-07 MED FILL — FINASTERIDE 5 MG TABLET: 5 | 30 days supply | Qty: 15 | Fill #4

## 2020-10-07 MED FILL — sulfaSALAzine 500 MG TABS: 500 | 30 days supply | Qty: 60 | Fill #1

## 2020-10-07 MED FILL — DOXYCYCLINE HYC 50 MG CAP: 50 | 90 days supply | Qty: 90 | Fill #1

## 2020-10-07 NOTE — Telephone Encounter (Signed)
Patient called to get COVID results. Made her aware they were negative.

## 2020-10-09 ENCOUNTER — Encounter: Payer: Self-pay | Admitting: Physician Assistant

## 2020-10-09 ENCOUNTER — Ambulatory Visit (INDEPENDENT_AMBULATORY_CARE_PROVIDER_SITE_OTHER): Payer: Medicare Other | Admitting: Physician Assistant

## 2020-10-09 ENCOUNTER — Ambulatory Visit (INDEPENDENT_AMBULATORY_CARE_PROVIDER_SITE_OTHER): Payer: Medicare Other

## 2020-10-09 DIAGNOSIS — G8929 Other chronic pain: Secondary | ICD-10-CM

## 2020-10-09 DIAGNOSIS — M25561 Pain in right knee: Secondary | ICD-10-CM

## 2020-10-09 DIAGNOSIS — Z9889 Other specified postprocedural states: Secondary | ICD-10-CM

## 2020-10-09 DIAGNOSIS — M25551 Pain in right hip: Secondary | ICD-10-CM

## 2020-10-09 DIAGNOSIS — Z8781 Personal history of (healed) traumatic fracture: Secondary | ICD-10-CM

## 2020-10-09 NOTE — Progress Notes (Signed)
Office Visit Note   Patient: Felicia Cain           Date of Birth: [birth year]           MRN: [id number] Visit Date: 10/09/2020              Requested by: Lajean Manes, MD [address] [place],  [place] [address] PCP: Lajean Manes, MD   Assessment & Plan: Visit Diagnoses:  1. Pain in right hip   2. Chronic pain of right knee   3. Status post-operative repair of closed fracture of right hip     Plan:  Recommend MRI arthrogram right hip to rule out labral tear.  Patient is having a MRI of her lumbar spine with and without contrast and would like to avoid insult to her kidneys in a short time span.  Therefore she will call us whenever she wants to proceed with the right hip arthrogram MRI.  See her back after the MRI to go over results discuss further treatment.  Disability paperwork was filled out for patient today.  Questions were encouraged and answered at length.  Follow-Up Instructions: Return After MRI arthrogram.   Orders:  Orders Placed This Encounter  Procedures  . XR HIP UNILAT W OR W/O PELVIS 2-3 VIEWS RIGHT  . XR Knee 1-2 Views Right   No orders of the defined types were placed in this encounter.     Procedures: No procedures performed   Clinical Data: No additional findings.   Subjective: Chief Complaint  Patient presents with  . Right Hip - Pain  . Right Knee - Pain    HPI Felicia Cain comes in today for right hip knee pain.  She has had pain since undergoing cannulated pinning of her right hip fracture on 01/26/2017.  States is getting more more difficult to walk.  She is having pain when sitting.  She states she cannot sit for prolonged period time less than 30 minutes cannot stand for more than 30 minutes due to the hip pain.  She constantly has to hips shift her leg.  She did have a fall on snow that we had recently and states since that time she has had a clicking painful pop in her right hip.  She also notes that her feet feel  cold and numbness tingling in her feet.  She is seeing Dr. Dorian Heckle and he is working up her lumbar spine she has an MRI scheduled for later on this month.  She also unfortunately has rheumatoid arthritis has difficulty grasping objects with her hands unable use a cane.  Review of Systems See HPI otherwise negative  Objective: Vital Signs: There were no vitals taken for this visit.  Physical Exam General: Well-developed well-nourished pleasant female in no acute distress.  Sits with her right leg extended out at all times. Psych alert and oriented x3. Ortho Exam Left hip excellent range of motion without pain.  Right hip she has pain with internal and external rotation and limited range of motion.  Leg lengths are near equal she is to slightly shorter on the right than the left. Bilateral knees excellent range of motion of both knees and without pain.  She has no tenderness along medial lateral joint line of either knee no effusion abnormal warmth erythema of either knee.  Specialty Comments:  No specialty comments available.  Imaging: XR HIP UNILAT W OR W/O PELVIS 2-3 VIEWS RIGHT  Result Date: 10/09/2020 AP pelvis lateral view  right hip: Patient status post cannulated pinning.  There is no evidence of osteonecrosis of the femoral head.  The hip joint is overall well-preserved.  Hips well located.  No hardware failure or backing out of the screws.  No acute findings.  XR Knee 1-2 Views Right  Result Date: 10/09/2020 Right knee 2 views: No acute fractures or acute findings.  All 3 compartments are well preserved.  No effusion.  No soft tissue swelling.  Knee is well located.  No bony abnormalities.    PMFS History: Patient Active Problem List   Diagnosis Date Noted  . 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 Peroneal neuropathy at knee, left 03/19/2019  . Patellar contusion, left, sequela 02/09/2019  . Rib pain on right side  11/24/2018  . Acquired absence of both breasts 09/20/2018  . Ruptured right breast implant 08/26/2018  . History of reconstruction of both breasts 07/08/2018  . Breast implant capsular contracture 07/08/2018  . Breast pain 07/08/2018  . 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 Status post-operative repair of closed fracture of right hip 09/08/2017  . Acute pain of right knee 05/24/2017  . Hip fracture ([place]) 01/25/2017  . Closed hip fracture, right, initial encounter ([place])   . Preop examination   . Fibromyalgia   . Neuropathy   . Corneal abrasion   . Nausea   . Fall   . SIRS (systemic inflammatory response syndrome) ([place]) 07/19/2015  . Seronegative myasthenia gravis ([place]) 07/19/2015  . Acute exacerbation of chronic low back pain 07/19/2015  . Leukocytosis 07/19/2015  . Myasthenia gravis in remission ([place]) 10/23/2014  . OSA on CPAP 10/23/2014  . Hypersomnia, persistent 10/23/2014  . Neurogenic hypoventilation 08/14/2014  . Hypoxemia 08/14/2014  . Myasthenia gravis with exacerbation ([place]) 08/14/2014  . Myasthenia gravis with exacerbation, ocular ([place]) 12/08/2012  . Right shoulder pain 08/02/2012  . 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  . History of optic neuritis 06/27/2012  . Rosacea 06/27/2012  . S/P laminectomy 06/27/2012  . 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 S/P bilateral mastectomy 06/26/2012  . BRCA negative 06/26/2012  . GERD (gastroesophageal reflux disease) 06/26/2012  . Menopause 06/26/2012  . Ankle pain 06/02/2012   Past Medical History:  Diagnosis Date  . Anxiety   . Arthritis    "back" (01/25/2017)  . Brachial neuritis    neuropathy  . Chronic lower back pain   . DCIS (ductal carcinoma in situ)    "left side?"  . Esophageal stricture    stricture with dysphasia  . Fibromyalgia   . GERD (gastroesophageal reflux disease)   . H/O Doppler ultrasound 2006   see scanned study  . H/O echocardiogram 2004, 2013  .  Hepatitis A 1961   [place]epidemic in my city"  . History of cardiac monitoring 2006  . History of nuclear stress test 2004   see scanned study  . Migraine    "in the past; cycle related" (01/25/2017)  . Myasthenia gravis in crisis [facility]) 04/2014   respiratory crisis  . NAION (non-arteritic anterior ischemic optic neuropathy), right   . Near syncope   . Neuromuscular disorder ([place])   . Neuropathy   . Optic neuritis    ischaemic optic neuritis, non arteric  . Optic neuropathy   . PONV (postoperative nausea and vomiting)   . Posterior optic neuritis   . Ptosis of eyelid   . Sleep apnea    "I wear Moses  device; I don't wear CPAP" (01/25/2017)  . Spinal stenosis     Family History  Problem Relation Age of Onset  . Stroke Mother   . Hypertension Mother   . Early death Father   . Cancer Father 42       sarcoma  . Parkinson's disease Maternal Aunt   . Cancer Maternal Uncle   . Cancer Paternal Grandmother        breast    Past Surgical History:  Procedure Laterality Date  . AUGMENTATION MAMMAPLASTY     2000, after breast cancer surgery,redone 2010  . BACK SURGERY    . BREAST BIOPSY    . BREAST CAPSULECTOMY WITH IMPLANT EXCHANGE Bilateral 09/28/2018   Procedure: REMOVAL OF BILATERAL BREAST IMPLANTS WITH CAPSULECTOMIES AND REPLACEMENTS OF IMPLANTS;  Surgeon: Wallace Going, DO;  Location: [place];  Service: Plastics;  Laterality: Bilateral;  . CARPAL TUNNEL RELEASE Bilateral   . CESAREAN SECTION  1989  . ESOPHAGOGASTRODUODENOSCOPY (EGD) WITH ESOPHAGEAL DILATION    . HIP PINNING,CANNULATED Right 01/26/2017   Procedure: CANNULATED SCREWS/RIGHT HIP PINNING;  Surgeon: Mcarthur Rossetti, MD;  Location: [place];  Service: Orthopedics;  Laterality: Right;  . INNER EAR SURGERY Right 1995  . LUMBAR LAMINECTOMY Right 1999   [name]  . MASTECTOMY Bilateral    Social History   Occupational History  . Not on file  Tobacco Use  . Smoking status: Never Smoker  .  Smokeless tobacco: Never Used  Vaping Use  . Vaping Use: Never used  Substance and Sexual Activity  . Alcohol use: No  . Drug use: No  . Sexual activity: Not Currently

## 2020-10-27 ENCOUNTER — Ambulatory Visit
Admission: RE | Admit: 2020-10-27 | Discharge: 2020-10-27 | Disposition: A | Discharge status: Home / Self Care | Payer: Medicare Other | Source: Ambulatory Visit | Attending: Neurosurgery | Admitting: Neurosurgery

## 2020-10-27 ENCOUNTER — Other Ambulatory Visit: Payer: Self-pay

## 2020-10-27 DIAGNOSIS — M4316 Spondylolisthesis, lumbar region: Secondary | ICD-10-CM

## 2020-10-27 DIAGNOSIS — M5125 Other intervertebral disc displacement, thoracolumbar region: Secondary | ICD-10-CM | POA: Diagnosis not present

## 2020-10-27 MED ORDER — GADOBENATE DIMEGLUMINE 529 MG/ML IV SOLN
12.0000 mL | Freq: Once | INTRAVENOUS | Status: AC | PRN
  Administered 2020-10-27: 12 mL via INTRAVENOUS

## 2020-10-30 ENCOUNTER — Other Ambulatory Visit: Payer: Medicare Other

## 2020-10-30 DIAGNOSIS — M545 Low back pain, unspecified: Secondary | ICD-10-CM | POA: Diagnosis not present

## 2020-10-30 DIAGNOSIS — G7000 Myasthenia gravis without (acute) exacerbation: Secondary | ICD-10-CM | POA: Diagnosis not present

## 2020-10-30 DIAGNOSIS — M5416 Radiculopathy, lumbar region: Secondary | ICD-10-CM | POA: Diagnosis not present

## 2020-11-04 DIAGNOSIS — R3915 Urgency of urination: Secondary | ICD-10-CM | POA: Diagnosis not present

## 2020-11-04 DIAGNOSIS — N952 Postmenopausal atrophic vaginitis: Secondary | ICD-10-CM | POA: Diagnosis not present

## 2020-11-04 DIAGNOSIS — R350 Frequency of micturition: Secondary | ICD-10-CM | POA: Diagnosis not present

## 2020-11-05 ENCOUNTER — Other Ambulatory Visit (HOSPITAL_COMMUNITY): Payer: Self-pay | Admitting: Rheumatology

## 2020-11-05 MED FILL — GABAPENTIN 300 MG CAPSULE: 300 | 30 days supply | Qty: 180 | Fill #0

## 2020-11-05 MED FILL — AMLODIPINE BESYLATE 2.5 MG: 2.5 | 30 days supply | Qty: 30 | Fill #0

## 2020-11-05 MED FILL — FINASTERIDE 5 MG TABLET: 5 | 30 days supply | Qty: 15 | Fill #5

## 2020-11-05 MED FILL — sulfaSALAzine 500 MG TABS: 500 | 30 days supply | Qty: 60 | Fill #2

## 2020-11-06 ENCOUNTER — Other Ambulatory Visit (HOSPITAL_COMMUNITY): Payer: Self-pay | Admitting: Dermatology

## 2020-11-06 DIAGNOSIS — L719 Rosacea, unspecified: Secondary | ICD-10-CM | POA: Diagnosis not present

## 2020-11-06 DIAGNOSIS — L304 Erythema intertrigo: Secondary | ICD-10-CM | POA: Diagnosis not present

## 2020-11-06 DIAGNOSIS — L661 Lichen planopilaris: Secondary | ICD-10-CM | POA: Diagnosis not present

## 2020-11-06 DIAGNOSIS — L409 Psoriasis, unspecified: Secondary | ICD-10-CM | POA: Diagnosis not present

## 2020-11-06 MED FILL — CLOBETASOL 0.05% SOLUTION: 0.05 | 30 days supply | Qty: 50 | Fill #0

## 2020-11-06 MED FILL — TRIAMCINOLONE 0.1% CREAM: 0.1 | 30 days supply | Qty: 100 | Fill #0

## 2020-11-06 MED FILL — TACROLIMUS 0.1% OINTMENT: 0.1 | 30 days supply | Qty: 60 | Fill #0

## 2020-11-06 MED FILL — HYDROCORTISONE 2.5% OINT: 2.5 | 30 days supply | Qty: 60 | Fill #0

## 2020-11-20 DIAGNOSIS — N3941 Urge incontinence: Secondary | ICD-10-CM | POA: Diagnosis not present

## 2020-11-20 DIAGNOSIS — M6281 Muscle weakness (generalized): Secondary | ICD-10-CM | POA: Diagnosis not present

## 2020-11-20 DIAGNOSIS — R3915 Urgency of urination: Secondary | ICD-10-CM | POA: Diagnosis not present

## 2020-11-20 DIAGNOSIS — R350 Frequency of micturition: Secondary | ICD-10-CM | POA: Diagnosis not present

## 2020-11-20 DIAGNOSIS — M6289 Other specified disorders of muscle: Secondary | ICD-10-CM | POA: Diagnosis not present

## 2020-11-20 DIAGNOSIS — M62838 Other muscle spasm: Secondary | ICD-10-CM | POA: Diagnosis not present

## 2020-11-22 ENCOUNTER — Other Ambulatory Visit (HOSPITAL_COMMUNITY): Payer: Self-pay | Admitting: Ophthalmology

## 2020-11-22 DIAGNOSIS — G7000 Myasthenia gravis without (acute) exacerbation: Secondary | ICD-10-CM | POA: Diagnosis not present

## 2020-11-22 DIAGNOSIS — H11823 Conjunctivochalasis, bilateral: Secondary | ICD-10-CM | POA: Diagnosis not present

## 2020-11-22 DIAGNOSIS — H40012 Open angle with borderline findings, low risk, left eye: Secondary | ICD-10-CM | POA: Diagnosis not present

## 2020-12-02 ENCOUNTER — Other Ambulatory Visit (HOSPITAL_COMMUNITY): Payer: Self-pay | Admitting: Ophthalmology

## 2020-12-03 ENCOUNTER — Other Ambulatory Visit (HOSPITAL_COMMUNITY): Payer: Self-pay

## 2020-12-09 ENCOUNTER — Other Ambulatory Visit (HOSPITAL_COMMUNITY): Payer: Self-pay

## 2020-12-09 DIAGNOSIS — R3915 Urgency of urination: Secondary | ICD-10-CM | POA: Diagnosis not present

## 2020-12-09 DIAGNOSIS — N3941 Urge incontinence: Secondary | ICD-10-CM | POA: Diagnosis not present

## 2020-12-09 DIAGNOSIS — M62838 Other muscle spasm: Secondary | ICD-10-CM | POA: Diagnosis not present

## 2020-12-09 DIAGNOSIS — M6281 Muscle weakness (generalized): Secondary | ICD-10-CM | POA: Diagnosis not present

## 2020-12-09 DIAGNOSIS — M6289 Other specified disorders of muscle: Secondary | ICD-10-CM | POA: Diagnosis not present

## 2020-12-09 DIAGNOSIS — R350 Frequency of micturition: Secondary | ICD-10-CM | POA: Diagnosis not present

## 2020-12-09 MED ORDER — FINASTERIDE 5 MG PO TABS
2.5000 mg | ORAL_TABLET | Freq: Every day | ORAL | 5 refills | Status: DC
  Filled 2020-12-09: qty 15, 30d supply, fill #0
  Filled 2021-01-01: qty 15, 30d supply, fill #1

## 2020-12-09 MED FILL — Doxycycline Hyclate Cap 50 MG: ORAL | 90 days supply | Qty: 90 | Fill #0 | Status: CN

## 2020-12-09 MED FILL — Sulfasalazine Tab 500 MG: ORAL | 30 days supply | Qty: 60 | Fill #0 | Status: AC

## 2020-12-10 ENCOUNTER — Other Ambulatory Visit (HOSPITAL_COMMUNITY): Payer: Self-pay

## 2020-12-16 ENCOUNTER — Encounter: Payer: Self-pay | Admitting: Physician Assistant

## 2020-12-16 ENCOUNTER — Ambulatory Visit (INDEPENDENT_AMBULATORY_CARE_PROVIDER_SITE_OTHER): Payer: Medicare Other | Admitting: Physician Assistant

## 2020-12-16 DIAGNOSIS — Z9889 Other specified postprocedural states: Secondary | ICD-10-CM | POA: Diagnosis not present

## 2020-12-16 DIAGNOSIS — M25551 Pain in right hip: Secondary | ICD-10-CM

## 2020-12-16 DIAGNOSIS — Z8781 Personal history of (healed) traumatic fracture: Secondary | ICD-10-CM | POA: Diagnosis not present

## 2020-12-16 NOTE — Progress Notes (Signed)
HPI: [name] returns today due to right hip pain.  She states that she feels that the hip pain is slightly better.  The clicking inside the hip is slightly better also.  She saw a DO over in [place] who did a pelvic adjustment for her and this . she wants to hold off on the MRI arthrogram of the right hip.  She would like referral to DO Dr. Paulla Cain for possible pelvic adjustments.  She also wants to get a physical therapy for range of motion strengthening her right hip.  And is asking for handicap sticker.  Still having pain deep within the right hip.  Pain is worse when sitting on the stool.  Review of systems: See HPI otherwise negative  Physical exam: Right hip she has limited motion of the hip with extremes of internal and external rotation causes pain.  She ambulates without any assistive device today.  Impression: Status post cannulated pinning right hip fracture 01/26/2017 Chronic right hip pain  Plan: At this point time will refer to physical therapy for range of motion strengthening right hip.  Also referred to Dr. Reece Cain for possible pelvic adjustments.  She will follow up with [place] as needed.  Questions were encouraged and answered.  Handicap form was given to that she can obtain a handicap sticker.

## 2020-12-19 ENCOUNTER — Other Ambulatory Visit (HOSPITAL_COMMUNITY): Payer: Self-pay

## 2020-12-24 DIAGNOSIS — Z23 Encounter for immunization: Secondary | ICD-10-CM | POA: Diagnosis not present

## 2020-12-30 DIAGNOSIS — Z20822 Contact with and (suspected) exposure to covid-19: Secondary | ICD-10-CM | POA: Diagnosis not present

## 2021-01-01 ENCOUNTER — Other Ambulatory Visit (HOSPITAL_COMMUNITY): Payer: Self-pay

## 2021-01-01 DIAGNOSIS — H9201 Otalgia, right ear: Secondary | ICD-10-CM | POA: Diagnosis not present

## 2021-01-01 DIAGNOSIS — J040 Acute laryngitis: Secondary | ICD-10-CM | POA: Diagnosis not present

## 2021-01-01 DIAGNOSIS — G7000 Myasthenia gravis without (acute) exacerbation: Secondary | ICD-10-CM | POA: Diagnosis not present

## 2021-01-01 DIAGNOSIS — J3489 Other specified disorders of nose and nasal sinuses: Secondary | ICD-10-CM | POA: Diagnosis not present

## 2021-01-01 MED ORDER — PREDNISONE 10 MG (21) PO TBPK
ORAL_TABLET | ORAL | 0 refills | Status: DC
  Filled 2021-01-01: qty 21, 6d supply, fill #0

## 2021-01-01 MED ORDER — VALACYCLOVIR HCL 1 G PO TABS: ORAL_TABLET | ORAL | 0 refills | Status: DC

## 2021-01-01 MED ORDER — VALACYCLOVIR HCL 1 G PO TABS
ORAL_TABLET | ORAL | 0 refills | Status: DC
  Filled 2021-01-01: qty 21, 7d supply, fill #0

## 2021-01-01 MED FILL — Doxycycline Hyclate Cap 50 MG: ORAL | 90 days supply | Qty: 90 | Fill #0 | Status: AC

## 2021-01-01 MED FILL — Sulfasalazine Tab 500 MG: ORAL | 30 days supply | Qty: 60 | Fill #1 | Status: AC

## 2021-01-01 MED FILL — Erythromycin Ophth Oint 5 MG/GM: OPHTHALMIC | 30 days supply | Qty: 10.5 | Fill #0 | Status: AC

## 2021-01-02 ENCOUNTER — Other Ambulatory Visit (HOSPITAL_COMMUNITY): Payer: Self-pay

## 2021-01-02 DIAGNOSIS — G7000 Myasthenia gravis without (acute) exacerbation: Secondary | ICD-10-CM | POA: Diagnosis not present

## 2021-01-02 DIAGNOSIS — Z8669 Personal history of other diseases of the nervous system and sense organs: Secondary | ICD-10-CM | POA: Diagnosis not present

## 2021-01-03 ENCOUNTER — Other Ambulatory Visit (HOSPITAL_COMMUNITY): Payer: Self-pay

## 2021-01-04 ENCOUNTER — Other Ambulatory Visit (HOSPITAL_COMMUNITY): Payer: Self-pay

## 2021-01-07 ENCOUNTER — Other Ambulatory Visit (HOSPITAL_COMMUNITY): Payer: Self-pay

## 2021-01-09 DIAGNOSIS — M1990 Unspecified osteoarthritis, unspecified site: Secondary | ICD-10-CM | POA: Diagnosis not present

## 2021-01-09 DIAGNOSIS — M6281 Muscle weakness (generalized): Secondary | ICD-10-CM | POA: Diagnosis not present

## 2021-01-09 DIAGNOSIS — L661 Lichen planopilaris: Secondary | ICD-10-CM | POA: Diagnosis not present

## 2021-01-09 DIAGNOSIS — Z4889 Encounter for other specified surgical aftercare: Secondary | ICD-10-CM | POA: Diagnosis not present

## 2021-01-09 DIAGNOSIS — M791 Myalgia, unspecified site: Secondary | ICD-10-CM | POA: Diagnosis not present

## 2021-01-09 DIAGNOSIS — L4050 Arthropathic psoriasis, unspecified: Secondary | ICD-10-CM | POA: Diagnosis not present

## 2021-01-09 DIAGNOSIS — L409 Psoriasis, unspecified: Secondary | ICD-10-CM | POA: Diagnosis not present

## 2021-01-09 DIAGNOSIS — M130 Polyarthritis, unspecified: Secondary | ICD-10-CM | POA: Diagnosis not present

## 2021-01-09 DIAGNOSIS — I7300 Raynaud's syndrome without gangrene: Secondary | ICD-10-CM | POA: Diagnosis not present

## 2021-01-09 DIAGNOSIS — M7989 Other specified soft tissue disorders: Secondary | ICD-10-CM | POA: Diagnosis not present

## 2021-01-09 DIAGNOSIS — M0609 Rheumatoid arthritis without rheumatoid factor, multiple sites: Secondary | ICD-10-CM | POA: Diagnosis not present

## 2021-01-09 DIAGNOSIS — R262 Difficulty in walking, not elsewhere classified: Secondary | ICD-10-CM | POA: Diagnosis not present

## 2021-01-09 DIAGNOSIS — M549 Dorsalgia, unspecified: Secondary | ICD-10-CM | POA: Diagnosis not present

## 2021-01-09 DIAGNOSIS — M79643 Pain in unspecified hand: Secondary | ICD-10-CM | POA: Diagnosis not present

## 2021-01-09 DIAGNOSIS — Z79899 Other long term (current) drug therapy: Secondary | ICD-10-CM | POA: Diagnosis not present

## 2021-01-10 ENCOUNTER — Other Ambulatory Visit (HOSPITAL_COMMUNITY): Payer: Self-pay

## 2021-01-10 MED ORDER — SULFASALAZINE 500 MG PO TABS
ORAL_TABLET | ORAL | 3 refills | Status: DC
  Filled 2021-01-10: qty 60, 30d supply, fill #0

## 2021-01-13 ENCOUNTER — Other Ambulatory Visit (HOSPITAL_COMMUNITY): Payer: Self-pay

## 2021-01-13 DIAGNOSIS — H73891 Other specified disorders of tympanic membrane, right ear: Secondary | ICD-10-CM | POA: Diagnosis not present

## 2021-01-13 DIAGNOSIS — H9201 Otalgia, right ear: Secondary | ICD-10-CM | POA: Diagnosis not present

## 2021-01-13 DIAGNOSIS — K219 Gastro-esophageal reflux disease without esophagitis: Secondary | ICD-10-CM | POA: Diagnosis not present

## 2021-01-13 MED ORDER — PANTOPRAZOLE SODIUM 40 MG PO TBEC
1.0000 | DELAYED_RELEASE_TABLET | Freq: Every day | ORAL | 5 refills | Status: DC
  Filled 2021-01-13: qty 30, 30d supply, fill #0

## 2021-01-13 MED ORDER — FAMOTIDINE 40 MG PO TABS
ORAL_TABLET | ORAL | 5 refills | Status: DC
  Filled 2021-01-13: qty 30, 30d supply, fill #0

## 2021-01-14 NOTE — Progress Notes (Signed)
Primary Physician/Referring:  Felicia Manes, MD  Patient ID: Felicia Cain, female    DOB: March 30, 1956, 65 y.o.   MRN: [id number]  Chief Complaint  Patient presents with  . Shortness of Breath  . Tachycardia  . Coronary Artery Disease   HPI:    Felicia Cain  is a 65 y.o. female patient with carcinoma in situ SP bilateral mastectomy, no history of chemo or radiation therapy in 2000, ocular myasthenia gravis, rheumatoid factor negative arthritis, made an appointment to see me due to recent episodes of palpitations that started about a month ago described as rapid beats that happens mostly during routine activities or at rest, elevated resting heart rate, decreased exercise tolerance with dyspnea on exertion and fatigue ongoing for a few months, no PND or orthopnea or leg edema, also had an episode of chest tightness with radiation to right arm that occurred on Mother's Day at night, also noticed an episode that occurred while she was on a treadmill few days ago.  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  In the interim it was felt that her symptoms of arthritis was probably related to psoriatic arthritis.  She is still being worked up.  She has no history of hypertension, hyperlipidemia, diabetes, tobacco use.  Past Medical History:  Diagnosis Date  . Anxiety   . Arthritis    "back" (01/25/2017)  . Brachial neuritis    neuropathy  . Chronic lower back pain   . DCIS (ductal carcinoma in situ)    "left side?"  . Esophageal stricture    stricture with dysphasia  . Fibromyalgia   . GERD (gastroesophageal reflux disease)   . H/O Doppler ultrasound 2006   see scanned study  . H/O echocardiogram 2004, 2013  . Hepatitis A 1961   "epidemic in my city"   . History of cardiac monitoring 2006  . History of nuclear stress test 2004   see scanned study  . Migraine    "in the past; cycle related" (01/25/2017)  . Myasthenia gravis in crisis [facility]) 04/2014   respiratory crisis  . NAION (non-arteritic anterior ischemic optic neuropathy), right   . Near syncope   . Neuromuscular disorder ([place])   . Neuropathy   . Optic neuritis    ischaemic optic neuritis, non arteric  . Optic neuropathy   . PONV (postoperative nausea and vomiting)   . Posterior optic neuritis   . Ptosis of eyelid   . Sleep apnea    "I wear Moses device; I don't wear CPAP" (01/25/2017)  . Spinal stenosis    Past Surgical History:  Procedure Laterality Date  . AUGMENTATION MAMMAPLASTY     2000, after breast cancer surgery,redone 2010  . BACK SURGERY    . BREAST BIOPSY    . BREAST CAPSULECTOMY WITH IMPLANT EXCHANGE Bilateral 09/28/2018   Procedure: REMOVAL OF BILATERAL BREAST IMPLANTS WITH CAPSULECTOMIES AND REPLACEMENTS OF IMPLANTS;  Surgeon: Wallace Going, DO;  Location: [place];  Service: Plastics;  Laterality: Bilateral;  . CARPAL TUNNEL RELEASE Bilateral   . CESAREAN SECTION  1989  . ESOPHAGOGASTRODUODENOSCOPY (EGD) WITH ESOPHAGEAL DILATION    . HIP PINNING,CANNULATED Right 01/26/2017   Procedure: CANNULATED SCREWS/RIGHT HIP PINNING;  Surgeon: Mcarthur Rossetti, MD;  Location: [place];  Service: Orthopedics;  Laterality: Right;  . INNER EAR SURGERY Right 1995  . LUMBAR LAMINECTOMY Right 1999   [name]  . MASTECTOMY Bilateral    Family History  Problem Relation Age of Onset  . Stroke Mother   . Hypertension Mother   . Early death Father   . Cancer Father 47       sarcoma  . Parkinson's disease Maternal Aunt   . Cancer Maternal Uncle   . Cancer Paternal Grandmother        breast    Social History   Tobacco Use  . Smoking status: Never Smoker  . Smokeless tobacco: Never Used  Substance Use Topics  . Alcohol use: No   Marital  Status: Widowed   ROS  Review of Systems  Constitutional: Positive for malaise/fatigue.  Cardiovascular: Positive for chest pain, dyspnea on exertion and palpitations. Negative for leg swelling.  Endocrine: Positive for cold intolerance.  Musculoskeletal: Positive for back pain.  Gastrointestinal: Negative for melena.    Objective  Blood pressure 129/72, pulse 93, temperature 98.3 F (36.8 C), height '5\' 6"'  (1.676 m), weight 153 lb (69.4 kg), SpO2 99 %.  Vitals with BMI 01/15/2021 05/20/2020 04/23/2020  Height '5\' 6"'  '5\' 6"'  5' 5.5"  Weight 153 lbs 151 lbs 145 lbs  BMI 24.71 43.15 40.08  Systolic 676 195 093  Diastolic 72 71 72  Pulse 93 86 -      Physical Exam Neck:     Vascular: No carotid bruit or JVD.  Cardiovascular:     Rate and Rhythm: Normal rate and regular rhythm.     Pulses: Intact distal pulses.     Heart sounds: Normal heart sounds. No murmur heard. No gallop.   Pulmonary:     Effort: Pulmonary effort is normal.     Breath sounds: Normal breath sounds.  Abdominal:     General: Bowel sounds are normal.     Palpations: Abdomen is soft.  Musculoskeletal:        General: No swelling.     Laboratory examination:   Recent Labs    02/20/20 1608  CREATININE 0.50   CrCl cannot be calculated (Patient's most recent lab result is older than the maximum 21 days allowed.).  CMP Latest Ref Rng & Units 02/20/2020 02/05/2017 01/29/2017  Glucose 65 - 99 mg/dL - 98 96  BUN 6 - 20 mg/dL - 22(H) 14  Creatinine 0.44 - 1.00 mg/dL 0.50 0.64 0.59  Sodium 135 - 145 mmol/L - 139 139  Potassium 3.5 - 5.1 mmol/L - 4.4 4.7  Chloride 101 - 111 mmol/L - 104 104  CO2 22 - 32 mmol/L - 26 27  Calcium 8.9 - 10.3 mg/dL - 9.2 8.8(L)  Total Protein 6.5 - 8.1 g/dL - - -  Total Bilirubin 0.3 - 1.2 mg/dL - - -  Alkaline Phos 38 - 126 U/L - - -  AST 15 - 41 U/L - - -  ALT 14 - 54 U/L - - -   CBC Latest Ref Rng & Units 02/05/2017 01/29/2017 01/28/2017  WBC 4.0 - 10.5 K/uL 9.2 6.4 8.0  Hemoglobin  12.0 - 15.0 g/dL 10.3(L) 9.7(L) 9.6(L)  Hematocrit 36.0 - 46.0 % 31.4(L) 29.3(L) 29.5(L)  Platelets 150 - 400 K/uL 434(H) 241 231    Lipid Panel No results for input(s): CHOL, TRIG, LDLCALC, VLDL, HDL, CHOLHDL, LDLDIRECT in the last 8760 hours.  HEMOGLOBIN A1C No results found for: HGBA1C, MPG TSH No results for input(s): TSH in the last 8760  hours.  External labs:   Labs 01/09/2021:  Hb 12.9/HCT 36.8, platelets 343.  Normal indicis.  CRP 0.03.  Sedimentation rate <2.  Labs 09/04/2020:  Serum glucose 80 mg, BUN 12, creatinine 0.63, EGFR greater than 60 mL, potassium 4.1.  CMP otherwise normal.  Total cholesterol 202, triglycerides 64, HDL 82, LDL 108.  Non-HDL cholesterol 120.  Medications and allergies   Allergies  Allergen Reactions  . Other Other (See Comments) and Rash    blisters  Blistering on site, can only use paper tape or surgical.  . Tape Rash and Other (See Comments)     Blistering on site, can only use paper tape or surgical.  . Adderall [Amphetamine-Dextroamphetamine] Other (See Comments)    headache  . Latex Itching and Swelling     Outpatient Medications Prior to Visit  Medication Sig Dispense Refill  . aspirin 81 MG chewable tablet Chew 81 mg by mouth daily.     Marland Kitchen CALCIUM-VITAMIN D PO Take 750 mg by mouth in the morning and at bedtime.    . cholecalciferol (VITAMIN D) 1000 UNITS tablet Take 5,000 Units by mouth daily.     Marland Kitchen denosumab (PROLIA) 60 MG/ML SOSY injection INJECT 1 ML EVERY 6 MONTHS UNDER THE SKIN 1 mL 1  . Doxycycline Hyclate 50 MG TABS Take 50 mg by mouth daily.     . finasteride (PROSCAR) 5 MG tablet TAKE 1/2 TABLET BY MOUTH ONCE A DAY 15 tablet 5  . gabapentin (NEURONTIN) 300 MG capsule TAKE 1 CAPSULE BY MOUTH 3 TIMES DAILY (Patient taking differently: Take 600 mg by mouth 2 (two) times daily.) 90 capsule 1  . Multiple Vitamins-Minerals (PRESERVISION AREDS 2) CAPS Take 1 capsule by mouth in the morning and at bedtime.    . pantoprazole  (PROTONIX) 40 MG tablet Take 1 tablet (40 mg total) by mouth daily. 30 tablet 5  . sulfaDIAZINE 500 MG tablet Take 500 mg by mouth 2 (two) times daily.    . Adalimumab 40 MG/0.8ML PNKT INJECT 0.8 ML SUBCUTANEOUSLY EVERY 2 WEEKS 2 each 2  . albuterol (VENTOLIN HFA) 108 (90 Base) MCG/ACT inhaler INHALE 2 PUFFS EVERY 4-6 HOURS AS NEEDED FOR WHEEZING    . amLODipine (NORVASC) 2.5 MG tablet 1 tablet    . amLODipine (NORVASC) 2.5 MG tablet TAKE 1 TABLET BY MOUTH ONCE A DAY 30 tablet 2  . amLODipine (NORVASC) 2.5 MG tablet TAKE 1 TABLET BY MOUTH ONCE A DAY 30 tablet 2  . clobetasol (TEMOVATE) 0.05 % external solution 3 (three) times a week.     . clobetasol (TEMOVATE) 0.05 % external solution APPLY TO AFFECTED AREA(S) EVERY OTHER DAY AS DIRECTED 50 mL 2  . clobetasol (TEMOVATE) 0.05 % external solution APPLY TO THE AFFECTED AREA(S) ONCE A DAY AS DIRECTED 50 mL 1  . cycloSPORINE, PF, (CEQUA) 0.09 % SOLN INSTILL 1 DROP INTO EYE TWICE DAILY    . cycloSPORINE, PF, 0.09 % SOLN PLACE 1 DROP IN BOTH EYES 2 TIMES DAILY 180 each 3  . cycloSPORINE, PF, 0.09 % SOLN INSTILL 1 DROP INTO EYE TWICE DAILY 60 each 3  . doxycycline (VIBRAMYCIN) 50 MG capsule TAKE 1 CAPSULE BY MOUTH ONCE A DAY 90 capsule 3  . erythromycin ophthalmic ointment PLACE INTO BOTH EYES NIGHTLY AS DIRECTED 10.5 g 3  . finasteride (PROSCAR) 5 MG tablet TAKE 1/2 TABLET BY MOUTH ONCE A DAY 15 tablet 5  . finasteride (PROSCAR) 5 MG tablet Take 1/2 tablet (2.5 mg total) by  mouth daily. 15 tablet 5  . gabapentin (NEURONTIN) 300 MG capsule TAKE 2 CAPSULES BY MOUTH EVERY 8 HOURS 180 capsule 6  . gabapentin (NEURONTIN) 300 MG capsule TAKE 2 CAPSULES BY MOUTH EVERY 8 HOURS 180 capsule 1  . hydrocortisone 2.5 % ointment APPLY TOPICALLY TWICE A DAY M-F ONLY AS DIRECTED 60 g 0  . hydrocortisone 2.5 % ointment APPLY TOPICALLY TWICE A DAY MONDAY THROUGH FRIDAY ONLY 56.7 g 0  . meloxicam (MOBIC) 7.5 MG tablet TAKE 1 TABLET BY MOUTH ONCE DAILY WITH FOOD 30  tablet 0  . methotrexate 50 MG/2ML injection Inject 25 mg into the skin once a week.    . methotrexate 50 MG/2ML injection INJECT 1 ML UNDER THE SKIN ONCE WEEKLY 4 mL 2  . modafinil (PROVIGIL) 100 MG tablet Take 1 tablet (100 mg total) by mouth daily. 90 tablet 1  . Naftifine HCl 2 % GEL APPLY TO THE AFFECTED AREA(S) ONCE A DAY 60 g 1  . NAFTIN 2 % GEL APPLY TO THE AFFECTED AREA(S) TWO TIMES DAILY FOR 14 DAYS    . NEEDLE, DISP, 30 G 30G X 1/2" MISC USE AS DIRECTED ONCE WEEKLY WITH METHOTREXATE 4 each 3  . NEEDLE, DISP, 30 G 30G X 1/2" MISC USE AS DIRECTED ONCE WEEKLY WITH METHOTREXATE 4 each 2  . Olopatadine HCl 0.2 % SOLN As needed  3  . Olopatadine HCl 0.2 % SOLN PLACE 1 DROP INTO BOTH EYES DAILY AT 6PM. 15 mL 3  . Olopatadine HCl 0.2 % SOLN PLACE 1 DROP IN BOTH EYES ONCE A DAY 7.5 mL 3  . predniSONE (STERAPRED UNI-PAK 21 TAB) 10 MG (21) TBPK tablet Take as directed on dose pack for 6 days. 21 each 0  . PROLIA 60 MG/ML SOSY injection SMARTSIG:1 Milliliter(s) SUB-Q Twice a Year    . pyridostigmine (MESTINON) 60 MG tablet TAKE 1 TABLET BY MOUTH UP TO 3 TIMES DAILY AS NEEDED 90 tablet 5  . SILVADENE 1 % cream APPLY TO THE AFFECTED AREA(S) TWICE DAILY FOR 7 DAYS 50 g 0  . Solriamfetol HCl (SUNOSI) 150 MG TABS Take 150 mg by mouth daily. 30 tablet 5  . sulfaSALAzine (AZULFIDINE) 500 MG tablet TAKE 1 TABLET BY MOUTH TWICE A DAY 60 tablet 3  . sulfaSALAzine (AZULFIDINE) 500 MG tablet TAKE 1 TABLET BY MOUTH 2 TIMES DAILY 60 tablet 2  . sulfaSALAzine (AZULFIDINE) 500 MG tablet TAKE 1 TABLET BY MOUTH TWICE DAILY 60 tablet 2  . sulfaSALAzine (AZULFIDINE) 500 MG tablet TAKE 1 TABLET BY MOUTH TWICE DAILY 60 tablet 3  . SYRINGE-NEEDLE, DISP, 3 ML 25G X 5/8" 3 ML MISC USE AS DIRECTED WITH METHOTREXATE 4 each 2  . tacrolimus (PROTOPIC) 0.1 % ointment APPLY TOPICALLY AS DIRECTED ONCE DAILY IN THE EVENING 60 g 1  . tacrolimus (PROTOPIC) 0.1 % ointment APPLY TO AFFECTED AREA(S) ONCE DAILY AS DIRECTED 60 g 30   . traMADol (ULTRAM) 50 MG tablet Take 1-2 tablets (50-100 mg total) by mouth every 6 (six) hours as needed. 30 tablet 0  . tretinoin (RETIN-A) 0.1 % cream APPLY TO THE AFFECTED AREA(S) OF THE FACE IN THE EVENING AS DIRECTED 20 g 5  . triamcinolone (KENALOG) 0.1 % APPLY TO FEET TWICE DAILY MONDAY-FRIDAY AS NEEDED. 100 g 0  . triamcinolone (KENALOG) 0.1 % APPLY TO THE AFFECTED AREA(S) OF FEET 2 TIMES DAILY AS NEEDED MONDAY THROUGH FRIDAY ONLY 100 g 0  . urea (CARMOL) 40 % CREA APPLY TO AFFECTED  AREA ONCE DAILY AS NEEDED. 85.05 g 0  . valACYclovir (VALTREX) 1000 MG tablet Take 1 tablet (1,000 mg total) by mouth 3 times daily for 7 days. 21 tablet 0  . valACYclovir (VALTREX) 1000 MG tablet Take 1 tablet (1,000 mg total) by mouth 3 times daily for 7 days. 21 tablet 0  . Varenicline Tartrate 0.03 MG/ACT SOLN USE 1 SPRAY IN EACH NOSTRIL 2 TIMES DAILY. 8.4 mL 5   No facility-administered medications prior to visit.     Radiology:   No results found.  Cardiac Studies:   None  EKG:   EKG 01/15/2021: Sinus tachycardia at rate of 100 bpm, normal axis, no evidence of ischemia, otherwise normal EKG.  Assessment     ICD-10-CM   1. SOB (shortness of breath)  R06.02 EKG 12-Lead    CT CARDIAC SCORING ([facility] LOCATIONS ONLY)    PCV CARDIAC STRESS TEST    PCV ECHOCARDIOGRAM COMPLETE  2. Palpitations  R00.2 EKG 12-Lead    LONG TERM MONITOR (3-14 DAYS)  3. Atypical chest pain  R07.89   4. Precordial pain   R07.2 CT CARDIAC SCORING ([facility] LOCATIONS ONLY)    PCV CARDIAC STRESS TEST    No orders of the defined types were placed in this encounter.   Recommendations:   Felicia Cain is a 65 y.o. female patient with carcinoma in situ SP bilateral mastectomy, no history of chemo or radiation therapy in 2000, ocular myasthenia gravis, rheumatoid factor negative arthritis, made an appointment to see me due to  : Palpitations that started about a month ago described as rapid beats that happens mostly  during routine activities or at rest, elevated resting heart rate : Decreased exercise tolerance with dyspnea on exertion and fatigue ongoing for a few months, no PND or orthopnea or leg edema. : Episode of chest tightness with radiation to right arm that occurred on Mother's Day at night, also noticed an episode that occurred while she was on a treadmill few days ago.  Physical examination is unremarkable and EKG revealed sinus tachycardia but otherwise no other significant abnormality.  Will obtain coronary calcium score for restratification. Schedule for routine treadmill stress test. Will schedule for an echocardiogram. I will set up for a Event/Extended EKG monitor for 14 days. Explained how to use it and to activate the device.  Office visit following the work-up/investigations. Reviewed her external labs.    Adrian Prows, PA-C 01/15/2021, 1:06 PM Office: [phone]  CC: Debbe Mounts, MD

## 2021-01-15 ENCOUNTER — Encounter: Payer: Self-pay | Admitting: Neurology

## 2021-01-15 ENCOUNTER — Ambulatory Visit: Payer: Medicare Other | Admitting: Cardiology

## 2021-01-15 ENCOUNTER — Encounter: Payer: Self-pay | Admitting: Student

## 2021-01-15 VITALS — BP 129/72 | HR 93 | Temp 98.3°F | Ht 66.0 in | Wt 153.0 lb

## 2021-01-15 DIAGNOSIS — R072 Precordial pain: Secondary | ICD-10-CM

## 2021-01-15 DIAGNOSIS — R0602 Shortness of breath: Secondary | ICD-10-CM | POA: Diagnosis not present

## 2021-01-15 DIAGNOSIS — R0789 Other chest pain: Secondary | ICD-10-CM

## 2021-01-15 DIAGNOSIS — R002 Palpitations: Secondary | ICD-10-CM | POA: Diagnosis not present

## 2021-01-16 DIAGNOSIS — R262 Difficulty in walking, not elsewhere classified: Secondary | ICD-10-CM | POA: Diagnosis not present

## 2021-01-16 DIAGNOSIS — Z4889 Encounter for other specified surgical aftercare: Secondary | ICD-10-CM | POA: Diagnosis not present

## 2021-01-16 DIAGNOSIS — M6281 Muscle weakness (generalized): Secondary | ICD-10-CM | POA: Diagnosis not present

## 2021-01-17 ENCOUNTER — Ambulatory Visit: Payer: Medicare Other

## 2021-01-17 ENCOUNTER — Other Ambulatory Visit: Payer: Self-pay

## 2021-01-17 DIAGNOSIS — R0602 Shortness of breath: Secondary | ICD-10-CM

## 2021-01-17 DIAGNOSIS — R072 Precordial pain: Secondary | ICD-10-CM | POA: Diagnosis not present

## 2021-01-17 DIAGNOSIS — K2100 Gastro-esophageal reflux disease with esophagitis, without bleeding: Secondary | ICD-10-CM

## 2021-01-20 ENCOUNTER — Other Ambulatory Visit (HOSPITAL_COMMUNITY): Payer: Self-pay

## 2021-01-20 ENCOUNTER — Ambulatory Visit (INDEPENDENT_AMBULATORY_CARE_PROVIDER_SITE_OTHER): Payer: Medicare Other | Admitting: Neurology

## 2021-01-20 ENCOUNTER — Encounter: Payer: Self-pay | Admitting: Neurology

## 2021-01-20 VITALS — BP 104/69 | HR 91 | Ht 66.0 in | Wt 153.0 lb

## 2021-01-20 DIAGNOSIS — Z9989 Dependence on other enabling machines and devices: Secondary | ICD-10-CM

## 2021-01-20 DIAGNOSIS — R29898 Other symptoms and signs involving the musculoskeletal system: Secondary | ICD-10-CM | POA: Insufficient documentation

## 2021-01-20 DIAGNOSIS — G4733 Obstructive sleep apnea (adult) (pediatric): Secondary | ICD-10-CM

## 2021-01-20 DIAGNOSIS — R3915 Urgency of urination: Secondary | ICD-10-CM | POA: Diagnosis not present

## 2021-01-20 DIAGNOSIS — R2681 Unsteadiness on feet: Secondary | ICD-10-CM | POA: Diagnosis not present

## 2021-01-20 DIAGNOSIS — R350 Frequency of micturition: Secondary | ICD-10-CM | POA: Diagnosis not present

## 2021-01-20 DIAGNOSIS — N3941 Urge incontinence: Secondary | ICD-10-CM | POA: Diagnosis not present

## 2021-01-20 DIAGNOSIS — N952 Postmenopausal atrophic vaginitis: Secondary | ICD-10-CM | POA: Diagnosis not present

## 2021-01-20 MED ORDER — GABAPENTIN 300 MG PO CAPS
600.0000 mg | ORAL_CAPSULE | Freq: Two times a day (BID) | ORAL | 1 refills | Status: DC
  Filled 2021-01-20: qty 180, 45d supply, fill #0
  Filled 2021-06-26: qty 180, 45d supply, fill #1

## 2021-01-20 MED ORDER — PYRIDOSTIGMINE BROMIDE 60 MG PO TABS
30.0000 mg | ORAL_TABLET | Freq: Three times a day (TID) | ORAL | 5 refills | Status: DC
  Filled 2021-01-20: qty 30, 20d supply, fill #0

## 2021-01-20 NOTE — Patient Instructions (Signed)
Adductor Muscle Strain With Rehab Ask your health care provider which exercises are safe for you. Do exercises exactly as told by your health care provider and adjust them as directed. It is normal to feel mild stretching, pulling, tightness, or mild discomfort as you do these exercises. Stop right away if you feel sudden pain or your pain gets worse. Do not begin these exercises until told by your health care provider. Strengthening exercises These exercises build strength and endurance in your thighs. Endurance is the ability to use your muscles for a long time, even after your muscles get tired. Hip adductor isometrics This exercise is sometimes called inner thigh squeeze. 1. Sit on a firm chair that positions your knees at about the same height as your hips. 2. Place a large ball, firm pillow, or rolled-up bath towel between your thighs. 3. Squeeze your thighs together, gradually building tension. 4. Hold for __________ seconds. 5. Release the tension gradually. Allow your inner thigh muscles to relax completely before you start the next repetition. Repeat __________ times. Complete this exercise __________ times a day.   Hip adduction This exercise is sometimes called side lying straight leg raises. 1. Lie on your side so your head, shoulder, knee, and hip are in a straight line with each other. To help maintain your balance, you may put the foot of your top leg in front of the leg that is on the floor. Your left / right leg should be on the bottom. 2. Roll your hips slightly forward so your hips are stacked directly over each other and your left / right knee is facing forward. 3. Tense the muscles of your inner thigh and lift your bottom leg 4-6 inches (10-15 cm). 4. Hold this position for __________ seconds. 5. Slowly lower your leg to the starting position. 6. Allow your muscles to relax completely before you start the next repetition. Repeat __________ times. Complete this exercise  __________ times a day.   Hip extension This exercise is sometimes called prone (on your belly) straight leg raises. 1. Lie on your belly on a bed or a firm surface with a pillow under your hips. 2. Tense your buttock muscles and lift your left / right thigh off the bed. Your left / right knee can be bent or straight, but do not let your back arch. 3. Hold this position for __________ seconds. 4. Slowly return to the starting position. 5. Allow your muscles to relax completely before you start the next repetition. Repeat __________ times. Complete this exercise __________ times a day.   Balance exercises These exercises improve or maintain your balance. Balance is important in preventing falls. Single-leg balance 1. Stand near a railing or by a door frame that you can hold onto as needed. 2. Stand on your left / right foot. Keep your big toe down on the floor and try to keep your arch lifted. 3. If this is too easy, you can stand with your eyes closed or stand on a pillow. 4. Hold this position for __________ seconds. Repeat __________ times. Complete this exercise __________ times a day. Side lunges 1. Stand with your feet together. 2. Keeping one foot in place, step to the side with your other foot about __________ inches (__________ cm). Do not step so far that you feel discomfort in your middle thigh. 3. Push off from your stepping foot to return to the starting position. Repeat __________ times. Complete this exercise __________ times a day. This information is not  intended to replace advice given to you by your health care provider. Make sure you discuss any questions you have with your health care provider. Document Revised: 12/13/2018 Document Reviewed: 05/24/2018 [employer] Patient Education  [place].
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
## 2021-01-20 NOTE — Addendum Note (Signed)
Addended by: Larey Seat on: 01/20/2021 12:22 PM   Modules accepted: Orders

## 2021-01-21 ENCOUNTER — Other Ambulatory Visit (HOSPITAL_COMMUNITY): Payer: Self-pay

## 2021-01-21 DIAGNOSIS — L309 Dermatitis, unspecified: Secondary | ICD-10-CM | POA: Diagnosis not present

## 2021-01-22 ENCOUNTER — Other Ambulatory Visit (HOSPITAL_COMMUNITY): Payer: Self-pay

## 2021-01-22 MED ORDER — ESTRADIOL 0.1 MG/GM VA CREA
TOPICAL_CREAM | VAGINAL | 3 refills | Status: DC
  Filled 2021-01-22: qty 85, 57d supply, fill #0

## 2021-01-23 ENCOUNTER — Other Ambulatory Visit: Payer: Self-pay

## 2021-01-23 ENCOUNTER — Inpatient Hospital Stay: Payer: Medicare Other

## 2021-01-23 ENCOUNTER — Ambulatory Visit: Payer: Medicare Other

## 2021-01-23 DIAGNOSIS — R0602 Shortness of breath: Secondary | ICD-10-CM

## 2021-01-23 DIAGNOSIS — R002 Palpitations: Secondary | ICD-10-CM | POA: Diagnosis not present

## 2021-01-23 DIAGNOSIS — R262 Difficulty in walking, not elsewhere classified: Secondary | ICD-10-CM | POA: Diagnosis not present

## 2021-01-23 DIAGNOSIS — Z4889 Encounter for other specified surgical aftercare: Secondary | ICD-10-CM | POA: Diagnosis not present

## 2021-01-23 DIAGNOSIS — M6281 Muscle weakness (generalized): Secondary | ICD-10-CM | POA: Diagnosis not present

## 2021-01-27 DIAGNOSIS — M6281 Muscle weakness (generalized): Secondary | ICD-10-CM | POA: Diagnosis not present

## 2021-01-27 DIAGNOSIS — R262 Difficulty in walking, not elsewhere classified: Secondary | ICD-10-CM | POA: Diagnosis not present

## 2021-01-27 DIAGNOSIS — Z4889 Encounter for other specified surgical aftercare: Secondary | ICD-10-CM | POA: Diagnosis not present

## 2021-01-27 MED ORDER — PANTOPRAZOLE SODIUM 40 MG PO TBEC
1.0000 | DELAYED_RELEASE_TABLET | Freq: Two times a day (BID) | ORAL | 1 refills | Status: DC
  Filled 2021-01-27: qty 60, 30d supply, fill #0

## 2021-01-27 NOTE — Progress Notes (Signed)
Change her visit to PRN and cancel 02/17/21

## 2021-01-27 NOTE — Progress Notes (Unsigned)
I have discussed the results of the echocardiogram and the stress test with the patient, she recently was also found to have significant GERD and mild esophagitis as well.  Symptoms of palpitations is improved, she has started to feel better.  She still has some symptoms of chest pain, advised her to use Protonix 40 mg twice daily for 1 month and then change it back to once a day and then potentially go to 1 tablet as needed as needed but to follow-up with GI for further recommendations.  All questions answered, I will change her visits to as needed.    ICD-10-CM   1. Gastroesophageal reflux disease with esophagitis without hemorrhage  K21.00 pantoprazole (PROTONIX) 40 MG tablet  2. SOB (shortness of breath)  R06.02 PCV CARDIAC STRESS TEST  3. Precordial pain  R07.2 PCV CARDIAC STRESS TEST    Meds ordered this encounter  Medications  . pantoprazole (PROTONIX) 40 MG tablet    Sig: Take 1 tablet (40 mg total) by mouth 2 (two) times daily.    Dispense:  60 tablet    Refill:  1     Adrian Prows, MD, [facility] 01/27/2021, 8:17 PM Office: [phone] Fax: [phone] Pager: [phone]   CC: Lajean Manes, MD

## 2021-01-28 ENCOUNTER — Other Ambulatory Visit (HOSPITAL_COMMUNITY): Payer: Self-pay

## 2021-01-28 MED ORDER — PROLIA 60 MG/ML ~~LOC~~ SOSY
PREFILLED_SYRINGE | SUBCUTANEOUS | 0 refills | Status: DC
  Filled 2021-03-11: qty 1, 180d supply, fill #0

## 2021-01-29 ENCOUNTER — Other Ambulatory Visit (HOSPITAL_COMMUNITY): Payer: Self-pay

## 2021-01-29 MED ORDER — PROLIA 60 MG/ML ~~LOC~~ SOSY
PREFILLED_SYRINGE | SUBCUTANEOUS | 2 refills | Status: DC
  Filled 2021-01-30: qty 1, 180d supply, fill #0

## 2021-01-30 ENCOUNTER — Other Ambulatory Visit (HOSPITAL_COMMUNITY): Payer: Self-pay

## 2021-01-30 DIAGNOSIS — R262 Difficulty in walking, not elsewhere classified: Secondary | ICD-10-CM | POA: Diagnosis not present

## 2021-01-30 DIAGNOSIS — M6281 Muscle weakness (generalized): Secondary | ICD-10-CM | POA: Diagnosis not present

## 2021-01-30 DIAGNOSIS — Z4889 Encounter for other specified surgical aftercare: Secondary | ICD-10-CM | POA: Diagnosis not present

## 2021-01-31 ENCOUNTER — Other Ambulatory Visit (HOSPITAL_COMMUNITY): Payer: Self-pay

## 2021-01-31 MED FILL — Finasteride Tab 5 MG: ORAL | 30 days supply | Qty: 15 | Fill #0 | Status: AC

## 2021-02-04 ENCOUNTER — Other Ambulatory Visit (HOSPITAL_COMMUNITY): Payer: Self-pay

## 2021-02-04 DIAGNOSIS — R262 Difficulty in walking, not elsewhere classified: Secondary | ICD-10-CM | POA: Diagnosis not present

## 2021-02-04 DIAGNOSIS — M6281 Muscle weakness (generalized): Secondary | ICD-10-CM | POA: Diagnosis not present

## 2021-02-04 DIAGNOSIS — Z4889 Encounter for other specified surgical aftercare: Secondary | ICD-10-CM | POA: Diagnosis not present

## 2021-02-04 MED ORDER — SULFASALAZINE 500 MG PO TABS
ORAL_TABLET | ORAL | 3 refills | Status: DC
  Filled 2021-02-04: qty 60, 30d supply, fill #0
  Filled 2021-03-28: qty 60, 30d supply, fill #1
  Filled 2021-04-26: qty 60, 30d supply, fill #2
  Filled 2021-05-27: qty 60, 30d supply, fill #3

## 2021-02-07 DIAGNOSIS — R002 Palpitations: Secondary | ICD-10-CM | POA: Diagnosis not present

## 2021-02-10 ENCOUNTER — Other Ambulatory Visit (HOSPITAL_COMMUNITY): Payer: Self-pay

## 2021-02-14 ENCOUNTER — Ambulatory Visit
Admission: RE | Admit: 2021-02-14 | Discharge: 2021-02-14 | Disposition: A | Discharge status: Home / Self Care | Payer: No Typology Code available for payment source | Source: Ambulatory Visit | Attending: Cardiology | Admitting: Cardiology

## 2021-02-14 DIAGNOSIS — R072 Precordial pain: Secondary | ICD-10-CM

## 2021-02-14 DIAGNOSIS — R0602 Shortness of breath: Secondary | ICD-10-CM

## 2021-02-14 NOTE — Progress Notes (Signed)
Coronary calcium score 02/14/2021: Coronary calcium score of 0.  MeSA database percentile 0.  Normal aortic measurements.  No other significant extracardiac abnormality.

## 2021-02-16 DIAGNOSIS — R002 Palpitations: Secondary | ICD-10-CM | POA: Diagnosis not present

## 2021-02-17 ENCOUNTER — Ambulatory Visit: Payer: PRIVATE HEALTH INSURANCE | Admitting: Cardiology

## 2021-02-17 ENCOUNTER — Ambulatory Visit: Payer: Medicare Other | Admitting: Cardiology

## 2021-02-17 ENCOUNTER — Encounter: Payer: Self-pay | Admitting: Cardiology

## 2021-02-17 ENCOUNTER — Other Ambulatory Visit: Payer: Self-pay

## 2021-02-17 VITALS — BP 121/65 | HR 94 | Temp 98.4°F | Resp 16 | Ht 66.0 in | Wt 153.8 lb

## 2021-02-17 DIAGNOSIS — R002 Palpitations: Secondary | ICD-10-CM

## 2021-02-17 DIAGNOSIS — R0602 Shortness of breath: Secondary | ICD-10-CM

## 2021-02-17 DIAGNOSIS — R0789 Other chest pain: Secondary | ICD-10-CM

## 2021-02-17 NOTE — Progress Notes (Signed)
Primary Physician/Referring:  Lajean Manes, MD  Patient ID: Felicia Cain, female    DOB: [birth year], 65 y.o.   MRN: [id number]  Chief Complaint  Patient presents with   Results    Heart Monitor, stress test, echocardiogram   HPI:    Felicia Cain  is a 65 y.o. 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  She is also noticed mild decreased exercise tolerance and dyspnea.  She had also had chest pain which has improved on increasing Protonix to twice daily dosing, but still states that she still has mild discomfort when she swallows.  She has history of esophageal stricture and GERD.  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  In the interim it was felt that her symptoms of arthritis was probably related to psoriatic arthritis.  She is still being worked up.   Past Medical History:  Diagnosis Date   Anxiety    Arthritis    "back" (01/25/2017)   Brachial neuritis    neuropathy   Chronic lower back pain    DCIS (ductal carcinoma in situ)    "left side?"   Esophageal stricture    stricture with dysphasia   Fibromyalgia    GERD (gastroesophageal reflux disease)    H/O Doppler ultrasound 2006   see scanned study   H/O echocardiogram 2004, 2013   Hepatitis A 1961   [place]epidemic [place] city"   History of cardiac monitoring 2006   History of nuclear stress test 2004   see  scanned study   Migraine    "in the past; cycle related" (01/25/2017)   Myasthenia gravis in crisis ([place]) 04/2014   respiratory crisis   NAION (non-arteritic anterior ischemic optic neuropathy), right    Near syncope    Neuromuscular disorder (HCC)    Neuropathy    Optic neuritis    ischaemic optic neuritis, non arteric   Optic neuropathy    PONV (postoperative nausea and vomiting)    Posterior optic neuritis    Ptosis of eyelid    Sleep apnea    "I wear Moses device; I don't wear CPAP" (01/25/2017)   Spinal stenosis    Past Surgical History:  Procedure Laterality Date   AUGMENTATION MAMMAPLASTY     2000, after breast cancer surgery,redone 2010   BACK SURGERY     BREAST BIOPSY     BREAST CAPSULECTOMY WITH IMPLANT EXCHANGE Bilateral 09/28/2018   Procedure: REMOVAL OF BILATERAL BREAST IMPLANTS WITH CAPSULECTOMIES AND REPLACEMENTS OF IMPLANTS;  Surgeon: Wallace Going, DO;  Location: [place];  Service: Plastics;  Laterality: Bilateral;   CARPAL TUNNEL RELEASE Bilateral    CESAREAN SECTION  1989   ESOPHAGOGASTRODUODENOSCOPY (EGD) WITH ESOPHAGEAL DILATION     HIP PINNING,CANNULATED Right 01/26/2017   Procedure: CANNULATED SCREWS/RIGHT HIP PINNING;  Surgeon: Mcarthur Rossetti, MD;  Location: [place];  Service: Orthopedics;  Laterality: Right;   INNER EAR SURGERY Right 1995   LUMBAR LAMINECTOMY Right  1999   [name]   MASTECTOMY Bilateral    Family History  Problem Relation Age of Onset   Stroke Mother    Hypertension Mother    Early death Father    Cancer Father 33       sarcoma   Parkinson's disease Maternal Aunt    Cancer Maternal Uncle    Cancer Paternal Grandmother        breast    Social History   Tobacco Use   Smoking status: Never   Smokeless tobacco: Never  Substance Use Topics   Alcohol use: No   Marital Status: Widowed   ROS  Review of Systems  Constitutional: Positive for malaise/fatigue.  Cardiovascular:  Positive for chest  pain, dyspnea on exertion and palpitations. Negative for leg swelling.  Endocrine: Positive for cold intolerance.  Musculoskeletal:  Positive for back pain.  Gastrointestinal:  Negative for melena.   Objective  Blood pressure 121/65, pulse 94, temperature 98.4 F (36.9 C), temperature source Temporal, resp. rate 16, height '5\' 6"'  (1.676 m), weight 153 lb 12.8 oz (69.8 kg), SpO2 97 %.  Vitals with BMI 02/17/2021 01/20/2021 01/15/2021  Height '5\' 6"'  '5\' 6"'  '5\' 6"'   Weight 153 lbs 13 oz 153 lbs 153 lbs  BMI 24.84 45.40 98.11  Systolic 914 782 956  Diastolic 65 69 72  Pulse 94 91 93      Physical Exam Neck:     Vascular: No carotid bruit or JVD.  Cardiovascular:     Rate and Rhythm: Normal rate and regular rhythm.     Pulses: Intact distal pulses.     Heart sounds: Normal heart sounds. No murmur heard.   No gallop.  Pulmonary:     Effort: Pulmonary effort is normal.     Breath sounds: Normal breath sounds.  Abdominal:     General: Bowel sounds are normal.     Palpations: Abdomen is soft.  Musculoskeletal:        General: No swelling.    Laboratory examination:   Recent Labs    02/20/20 1608  CREATININE 0.50   CrCl cannot be calculated (Patient's most recent lab result is older than the maximum 21 days allowed.).  CMP Latest Ref Rng & Units 02/20/2020 02/05/2017 01/29/2017  Glucose 65 - 99 mg/dL - 98 96  BUN 6 - 20 mg/dL - 22(H) 14  Creatinine 0.44 - 1.00 mg/dL 0.50 0.64 0.59  Sodium 135 - 145 mmol/L - 139 139  Potassium 3.5 - 5.1 mmol/L - 4.4 4.7  Chloride 101 - 111 mmol/L - 104 104  CO2 22 - 32 mmol/L - 26 27  Calcium 8.9 - 10.3 mg/dL - 9.2 8.8(L)  Total Protein 6.5 - 8.1 g/dL - - -  Total Bilirubin 0.3 - 1.2 mg/dL - - -  Alkaline Phos 38 - 126 U/L - - -  AST 15 - 41 U/L - - -  ALT 14 - 54 U/L - - -   CBC Latest Ref Rng & Units 02/05/2017 01/29/2017 01/28/2017  WBC 4.0 - 10.5 K/uL 9.2 6.4 8.0  Hemoglobin 12.0 - 15.0 g/dL 10.3(L) 9.7(L) 9.6(L)  Hematocrit 36.0 - 46.0 %  31.4(L) 29.3(L) 29.5(L)  Platelets 150 - 400 K/uL 434(H) 241 231    Lipid Panel No results for input(s): CHOL, TRIG, LDLCALC, VLDL, HDL, CHOLHDL, LDLDIRECT in the last 8760 hours.  HEMOGLOBIN A1C No results found for: HGBA1C, MPG TSH No results for input(s): TSH in the last 8760 hours.  External labs:  Labs 01/09/2021:  Hb 12.9/HCT 36.8, platelets 343.  Normal indicis.  CRP 0.03.  Sedimentation rate <2.  Labs 09/04/2020:  Serum glucose 80 mg, BUN 12, creatinine 0.63, EGFR greater than 60 mL, potassium 4.1.  CMP otherwise normal.  Total cholesterol 202, triglycerides 64, HDL 82, LDL 108.  Non-HDL cholesterol 120.  Medications and allergies   Allergies  Allergen Reactions   Other Other (See Comments) and Rash    blisters  Blistering on site, can only use paper tape or surgical.   Tape Rash and Other (See Comments)     Blistering on site, can only use paper tape or surgical.   Adderall [Amphetamine-Dextroamphetamine] Other (See Comments)    headache   Latex Itching and Swelling     Current Outpatient Medications  Medication Instructions   aspirin 81 mg, Oral, Daily   CALCIUM-VITAMIN D PO 750 mg, Oral, 2 times daily   cholecalciferol (VITAMIN D) 5,000 Units, Oral, Daily   denosumab (PROLIA) 60 MG/ML SOSY injection INJECT 1 ML EVERY 6 MONTHS UNDER THE SKIN   Doxycycline Hyclate 50 mg, Oral, Daily   finasteride (PROSCAR) 5 MG tablet TAKE 1/2 TABLET BY MOUTH ONCE A DAY   gabapentin (NEURONTIN) 600 mg, Oral, 2 times daily   Multiple Vitamins-Minerals (PRESERVISION AREDS 2) CAPS 1 capsule, Oral, 2 times daily   pantoprazole (PROTONIX) 40 mg, Oral, 2 times daily   pyridostigmine (MESTINON) 60 MG tablet Take 1/2 tablet (30 mg total) by mouth 3 times daily.   sulfaSALAzine (AZULFIDINE) 500 MG tablet TAKE 1 TABLET BY MOUTH TWICE DAILY     Radiology:   No results found.  Cardiac Studies:   Coronary calcium score 02/14/2021: Coronary calcium score of 0.  MeSA database  percentile 0.  Normal aortic measurements.  No other significant extracardiac abnormality.  Echocardiogram 01/23/2021: Left ventricle cavity is normal in size and wall thickness. Normal global wall motion. Normal LV systolic function with EF 61%. Normal diastolic fillingpattern.  Mild (Grade I) mitral regurgitation. Mild tricuspid regurgitation. No evidence of pulmonary hypertension.  Treadmill Exercise Stress 01/17/2021: The patient exercised for 8 minutes and 45 seconds on Bruce protocol; achieved 10.16 METs at 102% of maximum predicted heart rate.  Stress terminated due to THR achieved, non-limiting chest pain level 7 of 10 with radiation to right shoulder. The calculated Duke Treadmill Score is 0.75. No arrhythmias noted on ECG at stress. The heart rate response was normal. The blood pressure response was normal. Clinical correlation recommended.  Zio Patch Extended out patient EKG monitoring 14 days starting 01/23/2021: Patient had a min HR of 60 bpm, max HR of 156 bpm, and avg HR of 85 bpm.Predominant underlying rhythm was Sinus Rhythm.  Isolated SVEs were rare (<1.0%), and no SVE Couplets or SVE Triplets werepresent. Isolated VEs were rare (<1.0%).  There were no other complex arrhythmias, no atrial fibrillation or heart block. Symptomatic transmissions revealed isolated PACs and PVCs.  EKG:   EKG 01/15/2021: Sinus tachycardia at rate of 100 bpm, normal axis, no evidence of ischemia, otherwise normal EKG.  Assessment     ICD-10-CM   1. Palpitations  R00.2 Metanephrines, urine, 24 hour    2. Atypical chest pain  R07.89     3. SOB (shortness of breath)  R06.02       No orders of the defined types were placed in this encounter.  Recommendations:   Felicia Cain is a 65 y.o. 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  She is also noticed mild decreased exercise tolerance and dyspnea.  She had also had chest pain which has improved on increasing Protonix to twice daily dosing, but still states that she still has mild discomfort when she swallows.  She has history of esophageal stricture and GERD.  I will obtain 24-hour urinary metanephrines and normetanephrine's to exclude pheochromocytoma as patient has "spells of violent shaking and palpitations and elevated blood pressure that are episodic".  I reviewed stress test, echocardiogram and coronary calcium score which are all within normal limits for her age.  I do not see a cardiac etiology of her symptoms, extended EKG monitoring for 2 weeks revealed symptomatic PACs and PVCs.  She still continues to have difficulty with swallowing, has increased Protonix to 40 mg 3 times daily.  She may need further GI work-up to exclude either esophageal spasm or recurrence of stricture and will forward my note to Dr. Juanita Craver.    Adrian Prows, PA-C 02/18/2021, 6:49 AM Office: [phone]  CC: Debbe Mounts, MD; Juanita Craver, MD

## 2021-02-18 ENCOUNTER — Encounter: Payer: Self-pay | Admitting: Cardiology

## 2021-02-19 ENCOUNTER — Other Ambulatory Visit: Payer: Self-pay | Admitting: Cardiology

## 2021-02-19 DIAGNOSIS — R002 Palpitations: Secondary | ICD-10-CM | POA: Diagnosis not present

## 2021-02-20 ENCOUNTER — Other Ambulatory Visit (HOSPITAL_COMMUNITY): Payer: Self-pay

## 2021-02-20 DIAGNOSIS — I7300 Raynaud's syndrome without gangrene: Secondary | ICD-10-CM | POA: Diagnosis not present

## 2021-02-20 DIAGNOSIS — R059 Cough, unspecified: Secondary | ICD-10-CM | POA: Diagnosis not present

## 2021-02-20 DIAGNOSIS — M1990 Unspecified osteoarthritis, unspecified site: Secondary | ICD-10-CM | POA: Diagnosis not present

## 2021-02-20 DIAGNOSIS — K219 Gastro-esophageal reflux disease without esophagitis: Secondary | ICD-10-CM | POA: Diagnosis not present

## 2021-02-20 DIAGNOSIS — R110 Nausea: Secondary | ICD-10-CM | POA: Diagnosis not present

## 2021-02-20 DIAGNOSIS — L409 Psoriasis, unspecified: Secondary | ICD-10-CM | POA: Diagnosis not present

## 2021-02-20 DIAGNOSIS — F458 Other somatoform disorders: Secondary | ICD-10-CM | POA: Diagnosis not present

## 2021-02-20 DIAGNOSIS — L661 Lichen planopilaris: Secondary | ICD-10-CM | POA: Diagnosis not present

## 2021-02-20 DIAGNOSIS — L4050 Arthropathic psoriasis, unspecified: Secondary | ICD-10-CM | POA: Diagnosis not present

## 2021-02-20 DIAGNOSIS — Z79899 Other long term (current) drug therapy: Secondary | ICD-10-CM | POA: Diagnosis not present

## 2021-02-21 ENCOUNTER — Other Ambulatory Visit (HOSPITAL_COMMUNITY): Payer: Self-pay

## 2021-02-21 ENCOUNTER — Other Ambulatory Visit: Payer: Self-pay | Admitting: Gastroenterology

## 2021-02-21 DIAGNOSIS — R1310 Dysphagia, unspecified: Secondary | ICD-10-CM

## 2021-02-21 MED ORDER — FAMOTIDINE 40 MG PO TABS
40.0000 mg | ORAL_TABLET | Freq: Two times a day (BID) | ORAL | 4 refills | Status: DC
  Filled 2021-02-21: qty 180, 90d supply, fill #0
  Filled 2021-06-26: qty 180, 90d supply, fill #1

## 2021-02-21 MED ORDER — PANTOPRAZOLE SODIUM 40 MG PO TBEC
40.0000 mg | DELAYED_RELEASE_TABLET | Freq: Every day | ORAL | 4 refills | Status: DC
  Filled 2021-02-21: qty 90, 90d supply, fill #0
  Filled 2021-06-26: qty 90, 90d supply, fill #1

## 2021-02-22 ENCOUNTER — Other Ambulatory Visit (HOSPITAL_COMMUNITY): Payer: Self-pay

## 2021-03-01 LAB — METANEPHRINES, URINE, 24 HOUR
Metaneph Total, Ur: 61 ug/L
Metanephrines, 24H Ur: 134 ug/24 hr (ref 36–209)
Normetanephrine, 24H Ur: 427 ug/24 hr (ref 131–612)
Normetanephrine, Ur: 194 ug/L

## 2021-03-03 ENCOUNTER — Other Ambulatory Visit (HOSPITAL_COMMUNITY): Payer: Self-pay

## 2021-03-03 MED ORDER — TRETINOIN 0.1 % EX CREA
TOPICAL_CREAM | CUTANEOUS | 5 refills | Status: DC
  Filled 2021-03-03: qty 20, 30d supply, fill #0
  Filled 2021-07-22: qty 20, 30d supply, fill #1
  Filled 2021-12-23: qty 20, 30d supply, fill #2

## 2021-03-03 MED FILL — Finasteride Tab 5 MG: ORAL | 30 days supply | Qty: 15 | Fill #1 | Status: AC

## 2021-03-06 ENCOUNTER — Ambulatory Visit
Admission: RE | Admit: 2021-03-06 | Discharge: 2021-03-06 | Disposition: A | Discharge status: Home / Self Care | Payer: Medicare Other | Source: Ambulatory Visit | Attending: Gastroenterology | Admitting: Gastroenterology

## 2021-03-06 ENCOUNTER — Other Ambulatory Visit: Payer: Self-pay

## 2021-03-06 DIAGNOSIS — R1310 Dysphagia, unspecified: Secondary | ICD-10-CM | POA: Diagnosis not present

## 2021-03-07 ENCOUNTER — Other Ambulatory Visit (HOSPITAL_COMMUNITY): Payer: Self-pay

## 2021-03-11 ENCOUNTER — Other Ambulatory Visit (HOSPITAL_COMMUNITY): Payer: Self-pay

## 2021-03-12 ENCOUNTER — Other Ambulatory Visit (HOSPITAL_COMMUNITY): Payer: Self-pay

## 2021-03-13 ENCOUNTER — Telehealth: Payer: Self-pay | Admitting: Physician Assistant

## 2021-03-13 NOTE — Telephone Encounter (Signed)
Pt states she was referred to P.T[facility] at last appt but wanted to speak with a nurse about getting a new referral to a facility in [place]. Pt said the last place she was referred to had a break out of COVID and once she felt safe to got to the facility they had closed to pts. The best call back number is [phone].

## 2021-03-14 DIAGNOSIS — M65341 Trigger finger, right ring finger: Secondary | ICD-10-CM | POA: Diagnosis not present

## 2021-03-14 NOTE — Telephone Encounter (Signed)
Faxed to [facility] in [facility] per patient

## 2021-03-17 ENCOUNTER — Telehealth: Payer: Self-pay

## 2021-03-17 DIAGNOSIS — N952 Postmenopausal atrophic vaginitis: Secondary | ICD-10-CM | POA: Diagnosis not present

## 2021-03-17 DIAGNOSIS — Z124 Encounter for screening for malignant neoplasm of cervix: Secondary | ICD-10-CM | POA: Diagnosis not present

## 2021-03-17 DIAGNOSIS — Z01419 Encounter for gynecological examination (general) (routine) without abnormal findings: Secondary | ICD-10-CM | POA: Diagnosis not present

## 2021-03-17 DIAGNOSIS — M810 Age-related osteoporosis without current pathological fracture: Secondary | ICD-10-CM | POA: Diagnosis not present

## 2021-03-17 DIAGNOSIS — Z9013 Acquired absence of bilateral breasts and nipples: Secondary | ICD-10-CM | POA: Diagnosis not present

## 2021-03-17 DIAGNOSIS — Z853 Personal history of malignant neoplasm of breast: Secondary | ICD-10-CM | POA: Diagnosis not present

## 2021-03-17 NOTE — Telephone Encounter (Signed)
Pt called and would like a call back from [facility] regarding her referral

## 2021-03-18 NOTE — Telephone Encounter (Signed)
Faxed to provided number she gave me for [facility] [phone]

## 2021-03-25 DIAGNOSIS — M9906 Segmental and somatic dysfunction of lower extremity: Secondary | ICD-10-CM | POA: Diagnosis not present

## 2021-03-25 DIAGNOSIS — M9904 Segmental and somatic dysfunction of sacral region: Secondary | ICD-10-CM | POA: Diagnosis not present

## 2021-03-25 DIAGNOSIS — M9903 Segmental and somatic dysfunction of lumbar region: Secondary | ICD-10-CM | POA: Diagnosis not present

## 2021-03-25 DIAGNOSIS — M545 Low back pain, unspecified: Secondary | ICD-10-CM | POA: Diagnosis not present

## 2021-03-25 DIAGNOSIS — M546 Pain in thoracic spine: Secondary | ICD-10-CM | POA: Diagnosis not present

## 2021-03-25 DIAGNOSIS — G7001 Myasthenia gravis with (acute) exacerbation: Secondary | ICD-10-CM | POA: Diagnosis not present

## 2021-03-25 DIAGNOSIS — M9902 Segmental and somatic dysfunction of thoracic region: Secondary | ICD-10-CM | POA: Diagnosis not present

## 2021-03-25 DIAGNOSIS — M25551 Pain in right hip: Secondary | ICD-10-CM | POA: Diagnosis not present

## 2021-03-25 DIAGNOSIS — M25561 Pain in right knee: Secondary | ICD-10-CM | POA: Diagnosis not present

## 2021-03-25 DIAGNOSIS — M9905 Segmental and somatic dysfunction of pelvic region: Secondary | ICD-10-CM | POA: Diagnosis not present

## 2021-03-25 DIAGNOSIS — M9908 Segmental and somatic dysfunction of rib cage: Secondary | ICD-10-CM | POA: Diagnosis not present

## 2021-03-28 ENCOUNTER — Other Ambulatory Visit (HOSPITAL_COMMUNITY): Payer: Self-pay

## 2021-03-28 MED FILL — Finasteride Tab 5 MG: ORAL | 30 days supply | Qty: 15 | Fill #2 | Status: AC

## 2021-03-31 ENCOUNTER — Other Ambulatory Visit (HOSPITAL_COMMUNITY): Payer: Self-pay

## 2021-03-31 DIAGNOSIS — M9905 Segmental and somatic dysfunction of pelvic region: Secondary | ICD-10-CM | POA: Diagnosis not present

## 2021-03-31 DIAGNOSIS — M9904 Segmental and somatic dysfunction of sacral region: Secondary | ICD-10-CM | POA: Diagnosis not present

## 2021-03-31 DIAGNOSIS — M542 Cervicalgia: Secondary | ICD-10-CM | POA: Diagnosis not present

## 2021-03-31 DIAGNOSIS — M9901 Segmental and somatic dysfunction of cervical region: Secondary | ICD-10-CM | POA: Diagnosis not present

## 2021-03-31 DIAGNOSIS — M9903 Segmental and somatic dysfunction of lumbar region: Secondary | ICD-10-CM | POA: Diagnosis not present

## 2021-03-31 DIAGNOSIS — M545 Low back pain, unspecified: Secondary | ICD-10-CM | POA: Diagnosis not present

## 2021-03-31 DIAGNOSIS — M25551 Pain in right hip: Secondary | ICD-10-CM | POA: Diagnosis not present

## 2021-03-31 DIAGNOSIS — M546 Pain in thoracic spine: Secondary | ICD-10-CM | POA: Diagnosis not present

## 2021-03-31 DIAGNOSIS — M9906 Segmental and somatic dysfunction of lower extremity: Secondary | ICD-10-CM | POA: Diagnosis not present

## 2021-03-31 DIAGNOSIS — M9902 Segmental and somatic dysfunction of thoracic region: Secondary | ICD-10-CM | POA: Diagnosis not present

## 2021-04-01 ENCOUNTER — Other Ambulatory Visit (HOSPITAL_COMMUNITY): Payer: Self-pay

## 2021-04-01 MED ORDER — DOXYCYCLINE HYCLATE 50 MG PO CAPS
50.0000 mg | ORAL_CAPSULE | Freq: Every day | ORAL | 2 refills | Status: DC
  Filled 2021-04-01: qty 90, 90d supply, fill #0
  Filled 2021-06-26: qty 90, 90d supply, fill #1

## 2021-04-02 ENCOUNTER — Other Ambulatory Visit (HOSPITAL_COMMUNITY): Payer: Self-pay

## 2021-04-02 DIAGNOSIS — M25551 Pain in right hip: Secondary | ICD-10-CM | POA: Diagnosis not present

## 2021-04-07 DIAGNOSIS — M25551 Pain in right hip: Secondary | ICD-10-CM | POA: Diagnosis not present

## 2021-04-09 DIAGNOSIS — M9908 Segmental and somatic dysfunction of rib cage: Secondary | ICD-10-CM | POA: Diagnosis not present

## 2021-04-09 DIAGNOSIS — M5451 Vertebrogenic low back pain: Secondary | ICD-10-CM | POA: Diagnosis not present

## 2021-04-09 DIAGNOSIS — M9904 Segmental and somatic dysfunction of sacral region: Secondary | ICD-10-CM | POA: Diagnosis not present

## 2021-04-09 DIAGNOSIS — M25551 Pain in right hip: Secondary | ICD-10-CM | POA: Diagnosis not present

## 2021-04-09 DIAGNOSIS — R531 Weakness: Secondary | ICD-10-CM | POA: Diagnosis not present

## 2021-04-09 DIAGNOSIS — M9902 Segmental and somatic dysfunction of thoracic region: Secondary | ICD-10-CM | POA: Diagnosis not present

## 2021-04-09 DIAGNOSIS — M9906 Segmental and somatic dysfunction of lower extremity: Secondary | ICD-10-CM | POA: Diagnosis not present

## 2021-04-09 DIAGNOSIS — M546 Pain in thoracic spine: Secondary | ICD-10-CM | POA: Diagnosis not present

## 2021-04-09 DIAGNOSIS — M9905 Segmental and somatic dysfunction of pelvic region: Secondary | ICD-10-CM | POA: Diagnosis not present

## 2021-04-09 DIAGNOSIS — G7001 Myasthenia gravis with (acute) exacerbation: Secondary | ICD-10-CM | POA: Diagnosis not present

## 2021-04-09 DIAGNOSIS — M25561 Pain in right knee: Secondary | ICD-10-CM | POA: Diagnosis not present

## 2021-04-09 DIAGNOSIS — M9903 Segmental and somatic dysfunction of lumbar region: Secondary | ICD-10-CM | POA: Diagnosis not present

## 2021-04-15 DIAGNOSIS — M25551 Pain in right hip: Secondary | ICD-10-CM | POA: Diagnosis not present

## 2021-04-16 DIAGNOSIS — M9905 Segmental and somatic dysfunction of pelvic region: Secondary | ICD-10-CM | POA: Diagnosis not present

## 2021-04-16 DIAGNOSIS — M546 Pain in thoracic spine: Secondary | ICD-10-CM | POA: Diagnosis not present

## 2021-04-16 DIAGNOSIS — M9904 Segmental and somatic dysfunction of sacral region: Secondary | ICD-10-CM | POA: Diagnosis not present

## 2021-04-16 DIAGNOSIS — M25551 Pain in right hip: Secondary | ICD-10-CM | POA: Diagnosis not present

## 2021-04-16 DIAGNOSIS — M9903 Segmental and somatic dysfunction of lumbar region: Secondary | ICD-10-CM | POA: Diagnosis not present

## 2021-04-16 DIAGNOSIS — M9906 Segmental and somatic dysfunction of lower extremity: Secondary | ICD-10-CM | POA: Diagnosis not present

## 2021-04-16 DIAGNOSIS — M25561 Pain in right knee: Secondary | ICD-10-CM | POA: Diagnosis not present

## 2021-04-16 DIAGNOSIS — M545 Low back pain, unspecified: Secondary | ICD-10-CM | POA: Diagnosis not present

## 2021-04-22 DIAGNOSIS — M25551 Pain in right hip: Secondary | ICD-10-CM | POA: Diagnosis not present

## 2021-04-23 DIAGNOSIS — G7001 Myasthenia gravis with (acute) exacerbation: Secondary | ICD-10-CM | POA: Diagnosis not present

## 2021-04-23 DIAGNOSIS — M25561 Pain in right knee: Secondary | ICD-10-CM | POA: Diagnosis not present

## 2021-04-23 DIAGNOSIS — M9904 Segmental and somatic dysfunction of sacral region: Secondary | ICD-10-CM | POA: Diagnosis not present

## 2021-04-23 DIAGNOSIS — M9908 Segmental and somatic dysfunction of rib cage: Secondary | ICD-10-CM | POA: Diagnosis not present

## 2021-04-23 DIAGNOSIS — M545 Low back pain, unspecified: Secondary | ICD-10-CM | POA: Diagnosis not present

## 2021-04-23 DIAGNOSIS — M9903 Segmental and somatic dysfunction of lumbar region: Secondary | ICD-10-CM | POA: Diagnosis not present

## 2021-04-23 DIAGNOSIS — M9902 Segmental and somatic dysfunction of thoracic region: Secondary | ICD-10-CM | POA: Diagnosis not present

## 2021-04-23 DIAGNOSIS — M9905 Segmental and somatic dysfunction of pelvic region: Secondary | ICD-10-CM | POA: Diagnosis not present

## 2021-04-23 DIAGNOSIS — M25551 Pain in right hip: Secondary | ICD-10-CM | POA: Diagnosis not present

## 2021-04-23 DIAGNOSIS — M9906 Segmental and somatic dysfunction of lower extremity: Secondary | ICD-10-CM | POA: Diagnosis not present

## 2021-04-26 ENCOUNTER — Other Ambulatory Visit (HOSPITAL_COMMUNITY): Payer: Self-pay

## 2021-04-26 MED FILL — Finasteride Tab 5 MG: ORAL | 30 days supply | Qty: 15 | Fill #3 | Status: AC

## 2021-04-26 MED FILL — Cyclosporine (Ophth) Soln 0.09% (PF): OPHTHALMIC | 30 days supply | Qty: 120 | Fill #0 | Status: AC

## 2021-04-28 ENCOUNTER — Other Ambulatory Visit (HOSPITAL_COMMUNITY): Payer: Self-pay

## 2021-04-29 ENCOUNTER — Other Ambulatory Visit (HOSPITAL_COMMUNITY): Payer: Self-pay

## 2021-04-29 DIAGNOSIS — M25551 Pain in right hip: Secondary | ICD-10-CM | POA: Diagnosis not present

## 2021-04-30 DIAGNOSIS — M9905 Segmental and somatic dysfunction of pelvic region: Secondary | ICD-10-CM | POA: Diagnosis not present

## 2021-04-30 DIAGNOSIS — M9908 Segmental and somatic dysfunction of rib cage: Secondary | ICD-10-CM | POA: Diagnosis not present

## 2021-04-30 DIAGNOSIS — M9902 Segmental and somatic dysfunction of thoracic region: Secondary | ICD-10-CM | POA: Diagnosis not present

## 2021-04-30 DIAGNOSIS — M9906 Segmental and somatic dysfunction of lower extremity: Secondary | ICD-10-CM | POA: Diagnosis not present

## 2021-04-30 DIAGNOSIS — M546 Pain in thoracic spine: Secondary | ICD-10-CM | POA: Diagnosis not present

## 2021-04-30 DIAGNOSIS — M25561 Pain in right knee: Secondary | ICD-10-CM | POA: Diagnosis not present

## 2021-04-30 DIAGNOSIS — M542 Cervicalgia: Secondary | ICD-10-CM | POA: Diagnosis not present

## 2021-04-30 DIAGNOSIS — M9903 Segmental and somatic dysfunction of lumbar region: Secondary | ICD-10-CM | POA: Diagnosis not present

## 2021-04-30 DIAGNOSIS — M25551 Pain in right hip: Secondary | ICD-10-CM | POA: Diagnosis not present

## 2021-04-30 DIAGNOSIS — M9904 Segmental and somatic dysfunction of sacral region: Secondary | ICD-10-CM | POA: Diagnosis not present

## 2021-04-30 DIAGNOSIS — M545 Low back pain, unspecified: Secondary | ICD-10-CM | POA: Diagnosis not present

## 2021-05-05 DIAGNOSIS — M25551 Pain in right hip: Secondary | ICD-10-CM | POA: Diagnosis not present

## 2021-05-06 DIAGNOSIS — U071 COVID-19: Secondary | ICD-10-CM | POA: Diagnosis not present

## 2021-05-08 DIAGNOSIS — G252 Other specified forms of tremor: Secondary | ICD-10-CM | POA: Diagnosis not present

## 2021-05-08 DIAGNOSIS — G7000 Myasthenia gravis without (acute) exacerbation: Secondary | ICD-10-CM | POA: Diagnosis not present

## 2021-05-13 DIAGNOSIS — M25551 Pain in right hip: Secondary | ICD-10-CM | POA: Diagnosis not present

## 2021-05-15 DIAGNOSIS — Z79899 Other long term (current) drug therapy: Secondary | ICD-10-CM | POA: Diagnosis not present

## 2021-05-15 DIAGNOSIS — K219 Gastro-esophageal reflux disease without esophagitis: Secondary | ICD-10-CM | POA: Diagnosis not present

## 2021-05-15 DIAGNOSIS — I7300 Raynaud's syndrome without gangrene: Secondary | ICD-10-CM | POA: Diagnosis not present

## 2021-05-15 DIAGNOSIS — L409 Psoriasis, unspecified: Secondary | ICD-10-CM | POA: Diagnosis not present

## 2021-05-15 DIAGNOSIS — M1990 Unspecified osteoarthritis, unspecified site: Secondary | ICD-10-CM | POA: Diagnosis not present

## 2021-05-15 DIAGNOSIS — L661 Lichen planopilaris: Secondary | ICD-10-CM | POA: Diagnosis not present

## 2021-05-15 DIAGNOSIS — L4050 Arthropathic psoriasis, unspecified: Secondary | ICD-10-CM | POA: Diagnosis not present

## 2021-05-20 DIAGNOSIS — G7000 Myasthenia gravis without (acute) exacerbation: Secondary | ICD-10-CM | POA: Diagnosis not present

## 2021-05-20 DIAGNOSIS — R5383 Other fatigue: Secondary | ICD-10-CM | POA: Diagnosis not present

## 2021-05-20 DIAGNOSIS — R5382 Chronic fatigue, unspecified: Secondary | ICD-10-CM | POA: Diagnosis not present

## 2021-05-21 ENCOUNTER — Other Ambulatory Visit (HOSPITAL_COMMUNITY): Payer: Self-pay

## 2021-05-21 DIAGNOSIS — L661 Lichen planopilaris: Secondary | ICD-10-CM | POA: Diagnosis not present

## 2021-05-21 DIAGNOSIS — L650 Telogen effluvium: Secondary | ICD-10-CM | POA: Diagnosis not present

## 2021-05-21 DIAGNOSIS — L409 Psoriasis, unspecified: Secondary | ICD-10-CM | POA: Diagnosis not present

## 2021-05-21 DIAGNOSIS — L304 Erythema intertrigo: Secondary | ICD-10-CM | POA: Diagnosis not present

## 2021-05-21 DIAGNOSIS — L819 Disorder of pigmentation, unspecified: Secondary | ICD-10-CM | POA: Diagnosis not present

## 2021-05-21 MED ORDER — FINASTERIDE 5 MG PO TABS
ORAL_TABLET | ORAL | 2 refills | Status: DC
  Filled 2021-05-21: qty 15, 30d supply, fill #0
  Filled 2021-07-22: qty 15, 30d supply, fill #1

## 2021-05-21 MED ORDER — HYDROCORTISONE 2.5 % EX OINT
TOPICAL_OINTMENT | CUTANEOUS | 0 refills | Status: DC
  Filled 2021-05-21: qty 60, 30d supply, fill #0

## 2021-05-21 MED ORDER — TRIAMCINOLONE ACETONIDE 0.1 % EX CREA
TOPICAL_CREAM | CUTANEOUS | 0 refills | Status: DC
  Filled 2021-05-21: qty 100, 30d supply, fill #0

## 2021-05-21 MED ORDER — CLOBETASOL PROPIONATE 0.05 % EX FOAM
CUTANEOUS | 2 refills | Status: AC
  Filled 2021-05-21: qty 50, 30d supply, fill #0

## 2021-05-21 MED ORDER — TACROLIMUS 0.1 % EX OINT
TOPICAL_OINTMENT | CUTANEOUS | 0 refills | Status: DC
  Filled 2021-05-21: qty 100, 30d supply, fill #0

## 2021-05-22 ENCOUNTER — Other Ambulatory Visit (HOSPITAL_COMMUNITY): Payer: Self-pay

## 2021-05-27 ENCOUNTER — Other Ambulatory Visit (HOSPITAL_COMMUNITY): Payer: Self-pay

## 2021-05-28 DIAGNOSIS — M9908 Segmental and somatic dysfunction of rib cage: Secondary | ICD-10-CM | POA: Diagnosis not present

## 2021-05-28 DIAGNOSIS — M545 Low back pain, unspecified: Secondary | ICD-10-CM | POA: Diagnosis not present

## 2021-05-28 DIAGNOSIS — M9904 Segmental and somatic dysfunction of sacral region: Secondary | ICD-10-CM | POA: Diagnosis not present

## 2021-05-28 DIAGNOSIS — M25551 Pain in right hip: Secondary | ICD-10-CM | POA: Diagnosis not present

## 2021-05-28 DIAGNOSIS — M9902 Segmental and somatic dysfunction of thoracic region: Secondary | ICD-10-CM | POA: Diagnosis not present

## 2021-05-28 DIAGNOSIS — G7001 Myasthenia gravis with (acute) exacerbation: Secondary | ICD-10-CM | POA: Diagnosis not present

## 2021-05-28 DIAGNOSIS — M9903 Segmental and somatic dysfunction of lumbar region: Secondary | ICD-10-CM | POA: Diagnosis not present

## 2021-05-28 DIAGNOSIS — M9906 Segmental and somatic dysfunction of lower extremity: Secondary | ICD-10-CM | POA: Diagnosis not present

## 2021-05-28 DIAGNOSIS — M9905 Segmental and somatic dysfunction of pelvic region: Secondary | ICD-10-CM | POA: Diagnosis not present

## 2021-05-28 DIAGNOSIS — M25561 Pain in right knee: Secondary | ICD-10-CM | POA: Diagnosis not present

## 2021-06-04 DIAGNOSIS — M25551 Pain in right hip: Secondary | ICD-10-CM | POA: Diagnosis not present

## 2021-06-04 DIAGNOSIS — M9906 Segmental and somatic dysfunction of lower extremity: Secondary | ICD-10-CM | POA: Diagnosis not present

## 2021-06-04 DIAGNOSIS — M9902 Segmental and somatic dysfunction of thoracic region: Secondary | ICD-10-CM | POA: Diagnosis not present

## 2021-06-04 DIAGNOSIS — M9905 Segmental and somatic dysfunction of pelvic region: Secondary | ICD-10-CM | POA: Diagnosis not present

## 2021-06-04 DIAGNOSIS — M9903 Segmental and somatic dysfunction of lumbar region: Secondary | ICD-10-CM | POA: Diagnosis not present

## 2021-06-04 DIAGNOSIS — M546 Pain in thoracic spine: Secondary | ICD-10-CM | POA: Diagnosis not present

## 2021-06-04 DIAGNOSIS — M9904 Segmental and somatic dysfunction of sacral region: Secondary | ICD-10-CM | POA: Diagnosis not present

## 2021-06-04 DIAGNOSIS — M9908 Segmental and somatic dysfunction of rib cage: Secondary | ICD-10-CM | POA: Diagnosis not present

## 2021-06-04 DIAGNOSIS — M545 Low back pain, unspecified: Secondary | ICD-10-CM | POA: Diagnosis not present

## 2021-06-04 DIAGNOSIS — M25561 Pain in right knee: Secondary | ICD-10-CM | POA: Diagnosis not present

## 2021-06-12 DIAGNOSIS — H47291 Other optic atrophy, right eye: Secondary | ICD-10-CM | POA: Diagnosis not present

## 2021-06-12 DIAGNOSIS — H30142 Acute posterior multifocal placoid pigment epitheliopathy, left eye: Secondary | ICD-10-CM | POA: Diagnosis not present

## 2021-06-12 DIAGNOSIS — H40012 Open angle with borderline findings, low risk, left eye: Secondary | ICD-10-CM | POA: Diagnosis not present

## 2021-06-13 ENCOUNTER — Other Ambulatory Visit (HOSPITAL_COMMUNITY): Payer: Self-pay

## 2021-06-13 DIAGNOSIS — M25551 Pain in right hip: Secondary | ICD-10-CM | POA: Diagnosis not present

## 2021-06-13 MED ORDER — ERYTHROMYCIN 5 MG/GM OP OINT
TOPICAL_OINTMENT | OPHTHALMIC | 3 refills | Status: AC
  Filled 2021-06-13 (×2): qty 10.5, 90d supply, fill #0
  Filled 2021-12-23: qty 10.5, 90d supply, fill #1

## 2021-06-16 ENCOUNTER — Other Ambulatory Visit (HOSPITAL_COMMUNITY): Payer: Self-pay

## 2021-06-17 DIAGNOSIS — Z23 Encounter for immunization: Secondary | ICD-10-CM | POA: Diagnosis not present

## 2021-06-18 DIAGNOSIS — M546 Pain in thoracic spine: Secondary | ICD-10-CM | POA: Diagnosis not present

## 2021-06-18 DIAGNOSIS — M9904 Segmental and somatic dysfunction of sacral region: Secondary | ICD-10-CM | POA: Diagnosis not present

## 2021-06-18 DIAGNOSIS — M545 Low back pain, unspecified: Secondary | ICD-10-CM | POA: Diagnosis not present

## 2021-06-18 DIAGNOSIS — M9905 Segmental and somatic dysfunction of pelvic region: Secondary | ICD-10-CM | POA: Diagnosis not present

## 2021-06-18 DIAGNOSIS — M25561 Pain in right knee: Secondary | ICD-10-CM | POA: Diagnosis not present

## 2021-06-18 DIAGNOSIS — M9906 Segmental and somatic dysfunction of lower extremity: Secondary | ICD-10-CM | POA: Diagnosis not present

## 2021-06-18 DIAGNOSIS — M9908 Segmental and somatic dysfunction of rib cage: Secondary | ICD-10-CM | POA: Diagnosis not present

## 2021-06-18 DIAGNOSIS — M9902 Segmental and somatic dysfunction of thoracic region: Secondary | ICD-10-CM | POA: Diagnosis not present

## 2021-06-18 DIAGNOSIS — M25551 Pain in right hip: Secondary | ICD-10-CM | POA: Diagnosis not present

## 2021-06-18 DIAGNOSIS — M9903 Segmental and somatic dysfunction of lumbar region: Secondary | ICD-10-CM | POA: Diagnosis not present

## 2021-06-20 DIAGNOSIS — M25551 Pain in right hip: Secondary | ICD-10-CM | POA: Diagnosis not present

## 2021-06-23 DIAGNOSIS — R350 Frequency of micturition: Secondary | ICD-10-CM | POA: Diagnosis not present

## 2021-06-23 DIAGNOSIS — N952 Postmenopausal atrophic vaginitis: Secondary | ICD-10-CM | POA: Diagnosis not present

## 2021-06-23 DIAGNOSIS — R3915 Urgency of urination: Secondary | ICD-10-CM | POA: Diagnosis not present

## 2021-06-23 DIAGNOSIS — N3941 Urge incontinence: Secondary | ICD-10-CM | POA: Diagnosis not present

## 2021-06-25 ENCOUNTER — Other Ambulatory Visit (HOSPITAL_COMMUNITY): Payer: Self-pay

## 2021-06-26 ENCOUNTER — Other Ambulatory Visit (HOSPITAL_COMMUNITY): Payer: Self-pay

## 2021-06-26 MED ORDER — SULFASALAZINE 500 MG PO TABS
500.0000 mg | ORAL_TABLET | Freq: Two times a day (BID) | ORAL | 3 refills | Status: DC
  Filled 2021-06-26: qty 60, 30d supply, fill #0
  Filled 2021-07-22: qty 60, 30d supply, fill #1

## 2021-06-27 DIAGNOSIS — M25551 Pain in right hip: Secondary | ICD-10-CM | POA: Diagnosis not present

## 2021-07-01 DIAGNOSIS — M25551 Pain in right hip: Secondary | ICD-10-CM | POA: Diagnosis not present

## 2021-07-02 ENCOUNTER — Other Ambulatory Visit (HOSPITAL_COMMUNITY): Payer: Self-pay

## 2021-07-04 DIAGNOSIS — Z23 Encounter for immunization: Secondary | ICD-10-CM | POA: Diagnosis not present

## 2021-07-09 ENCOUNTER — Other Ambulatory Visit (HOSPITAL_COMMUNITY): Payer: Self-pay

## 2021-07-09 DIAGNOSIS — M9906 Segmental and somatic dysfunction of lower extremity: Secondary | ICD-10-CM | POA: Diagnosis not present

## 2021-07-09 DIAGNOSIS — M25551 Pain in right hip: Secondary | ICD-10-CM | POA: Diagnosis not present

## 2021-07-09 DIAGNOSIS — M9905 Segmental and somatic dysfunction of pelvic region: Secondary | ICD-10-CM | POA: Diagnosis not present

## 2021-07-09 DIAGNOSIS — M9904 Segmental and somatic dysfunction of sacral region: Secondary | ICD-10-CM | POA: Diagnosis not present

## 2021-07-09 DIAGNOSIS — M25561 Pain in right knee: Secondary | ICD-10-CM | POA: Diagnosis not present

## 2021-07-09 DIAGNOSIS — M6281 Muscle weakness (generalized): Secondary | ICD-10-CM | POA: Diagnosis not present

## 2021-07-09 DIAGNOSIS — M25562 Pain in left knee: Secondary | ICD-10-CM | POA: Diagnosis not present

## 2021-07-09 MED ORDER — TRAMADOL HCL 50 MG PO TABS
ORAL_TABLET | ORAL | 0 refills | Status: DC
  Filled 2021-07-09: qty 15, 5d supply, fill #0

## 2021-07-22 ENCOUNTER — Other Ambulatory Visit (HOSPITAL_COMMUNITY): Payer: Self-pay

## 2021-07-22 DIAGNOSIS — R3915 Urgency of urination: Secondary | ICD-10-CM | POA: Diagnosis not present

## 2021-07-22 DIAGNOSIS — N3941 Urge incontinence: Secondary | ICD-10-CM | POA: Diagnosis not present

## 2021-07-22 DIAGNOSIS — R350 Frequency of micturition: Secondary | ICD-10-CM | POA: Diagnosis not present

## 2021-07-23 ENCOUNTER — Ambulatory Visit (INDEPENDENT_AMBULATORY_CARE_PROVIDER_SITE_OTHER): Payer: Medicare Other | Admitting: Neurology

## 2021-07-23 ENCOUNTER — Encounter: Payer: Self-pay | Admitting: Neurology

## 2021-07-23 VITALS — BP 128/72 | HR 83 | Ht 66.0 in | Wt 154.5 lb

## 2021-07-23 DIAGNOSIS — G7000 Myasthenia gravis without (acute) exacerbation: Secondary | ICD-10-CM | POA: Diagnosis not present

## 2021-07-23 DIAGNOSIS — G4730 Sleep apnea, unspecified: Secondary | ICD-10-CM | POA: Diagnosis not present

## 2021-07-23 DIAGNOSIS — R251 Tremor, unspecified: Secondary | ICD-10-CM

## 2021-07-23 DIAGNOSIS — R260 Ataxic gait: Secondary | ICD-10-CM | POA: Diagnosis not present

## 2021-07-23 NOTE — Progress Notes (Signed)
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 on CPAP - she is willing to retest for positional apnea, by a HST without CPAP- and while she is not sleeping on her back, non-supine only.  I will offer to  refill today of Mestinon and modafinil. She feels better on it.     Larey Seat, MD  Cc Emi Belfast, MD

## 2021-07-24 ENCOUNTER — Other Ambulatory Visit (HOSPITAL_COMMUNITY): Payer: Self-pay

## 2021-07-29 DIAGNOSIS — N3941 Urge incontinence: Secondary | ICD-10-CM | POA: Diagnosis not present

## 2021-08-05 ENCOUNTER — Telehealth: Payer: Self-pay | Admitting: Neurology

## 2021-08-05 ENCOUNTER — Ambulatory Visit (INDEPENDENT_AMBULATORY_CARE_PROVIDER_SITE_OTHER): Payer: Medicare Other | Admitting: Sports Medicine

## 2021-08-05 VITALS — BP 120/80 | Ht 66.0 in | Wt 150.0 lb

## 2021-08-05 DIAGNOSIS — M25579 Pain in unspecified ankle and joints of unspecified foot: Secondary | ICD-10-CM | POA: Diagnosis not present

## 2021-08-05 NOTE — Telephone Encounter (Signed)
LVM for pt to call me back to schedule sleep study  

## 2021-08-05 NOTE — Progress Notes (Signed)
  AUDYN DIMERCURIO - 65 y.o. female MRN [id number]  Date of birth: 06-17-1956  SUBJECTIVE:   CC: L foot pain  Ms. NINETTE COTTA is a 65 year old Caucasian female with extensive past medical history including psoriatic arthritis, hx of myasthenia gravis, left peroneal neuropathy, right hip fracture status post repair, who presents to clinic for bilateral foot pain left >right.  Patient reports getting transcutaneous tibial nerve stimulation for treatment of overactive bladder.  After her first treatment 2 wks ago, with R tibial nerve stim, she had no acute complications.  She had her second treatment last week., and hours later started having pain that radiates from above the ankle into the foot, with pain on her heel and arch of the foot.  Her pain is variable with tingling, buzzing that radiates to her foot as well as sharp pain when taking steps and dull pain when resting or seated.  Icing the foot helps, 800 mg ibuprofen did not help, Voltaren gel may have helped. She has noticed similar pains starting in her R foot. Patient has good medical insight and was wondering if this may be plantar fasciitis though she is aware that her symptoms are not typical of the syndrome.  She has concerns that the transcutaneous tibial nerve stimulation may be causing the pain.  She was supposed to have her third treatment today however declined and preferred to wait for evaluation by sports medicine.  ROS: Negative aside from above  PHYSICAL EXAM:  VS: BP:120/80  HR: bpm  TEMP: ( )  RESP:   HT:5\' 6"  (167.6 cm)   WT:150 lb (68 kg)  BMI:24.22 PHYSICAL EXAM: Gen: NAD, alert, cooperative with exam, well-appearing HEENT: clear conjunctiva, nontraumatic CV:  no peripheral edema, extremities well perfused Resp: non-labored breathing on room air Skin: no rashes, normal turgor  Neuro: no gross deficits  Psych:  alert and oriented MSK: Normal appearance bilateral ankles without skin changes or swelling. Normal  ROM and 5/5 ankle plantar flexion, dorsiflexion, eversion, inversion. Tenderness to palpation medial and lateral side of L foot with positive Tinel's sign over tibial nerve. Increased L ankle laxity.  ASSESSMENT & PLAN:   Ms. AMANDA STEUART is a 65 year old Caucasian female with extensive past medical history who presents to clinic for bilateral foot pain left >right. Her neuropathic pain is likely a result of her transcutaneous tibial nerve stimulation sessions which irritated her tibial nerves, causing neuropathic pain in the tibial nerve distrubution. Plan: -Discontinue transcutaneous tibial nerve stimulation -Continue to take gabapentin 300mg  BID -Continue ice therapy if this has been helpful -Can consider trial of steroids if pain does not improve over the next few weeks -Follow up as needed  Portions of this report may have been transcribed using voice recognition software. Every effort was made to ensure accuracy; however, inadvertent computerized transcription errors may be present.   Wayland Denis, MD 08/05/21,  7:00 PM Pager: [phone] Internal Medicine Resident, PGY-1 [facility]     Patient seen and evaluated with the resident.  I agree with the above plan of care.  There is likely some tibial nerve irritation from her transcutaneous tibial nerve stimulation that she is undergoing for her overactive bladder.  I have recommended that she discontinue this treatment immediately.  Hopefully symptoms will improve over the next week or 2.  If not, consider a trial of oral steroids.

## 2021-08-12 ENCOUNTER — Telehealth: Payer: Self-pay | Admitting: Neurology

## 2021-08-12 NOTE — Telephone Encounter (Signed)
LVM for pt to call me back to schedule sleep study  

## 2021-08-14 DIAGNOSIS — K219 Gastro-esophageal reflux disease without esophagitis: Secondary | ICD-10-CM | POA: Diagnosis not present

## 2021-08-14 DIAGNOSIS — I7300 Raynaud's syndrome without gangrene: Secondary | ICD-10-CM | POA: Diagnosis not present

## 2021-08-14 DIAGNOSIS — M1990 Unspecified osteoarthritis, unspecified site: Secondary | ICD-10-CM | POA: Diagnosis not present

## 2021-08-14 DIAGNOSIS — L4050 Arthropathic psoriasis, unspecified: Secondary | ICD-10-CM | POA: Diagnosis not present

## 2021-08-14 DIAGNOSIS — L661 Lichen planopilaris: Secondary | ICD-10-CM | POA: Diagnosis not present

## 2021-08-14 DIAGNOSIS — L409 Psoriasis, unspecified: Secondary | ICD-10-CM | POA: Diagnosis not present

## 2021-08-14 DIAGNOSIS — Z79899 Other long term (current) drug therapy: Secondary | ICD-10-CM | POA: Diagnosis not present

## 2021-08-15 ENCOUNTER — Other Ambulatory Visit (HOSPITAL_COMMUNITY): Payer: Self-pay

## 2021-08-15 MED ORDER — SULFASALAZINE 500 MG PO TABS
500.0000 mg | ORAL_TABLET | Freq: Two times a day (BID) | ORAL | 4 refills | Status: DC
  Filled 2021-08-15: qty 60, 30d supply, fill #0
  Filled 2021-09-26: qty 60, 30d supply, fill #1
  Filled 2021-10-23: qty 60, 30d supply, fill #2
  Filled 2021-11-22: qty 60, 30d supply, fill #3
  Filled 2021-12-23: qty 60, 30d supply, fill #4

## 2021-08-16 ENCOUNTER — Other Ambulatory Visit (HOSPITAL_COMMUNITY): Payer: Self-pay

## 2021-08-19 ENCOUNTER — Telehealth: Payer: Self-pay | Admitting: Neurology

## 2021-08-19 ENCOUNTER — Other Ambulatory Visit (HOSPITAL_COMMUNITY): Payer: Self-pay

## 2021-08-19 DIAGNOSIS — M25561 Pain in right knee: Secondary | ICD-10-CM | POA: Diagnosis not present

## 2021-08-19 DIAGNOSIS — M79621 Pain in right upper arm: Secondary | ICD-10-CM | POA: Diagnosis not present

## 2021-08-19 DIAGNOSIS — M9901 Segmental and somatic dysfunction of cervical region: Secondary | ICD-10-CM | POA: Diagnosis not present

## 2021-08-19 DIAGNOSIS — M25562 Pain in left knee: Secondary | ICD-10-CM | POA: Diagnosis not present

## 2021-08-19 DIAGNOSIS — M9902 Segmental and somatic dysfunction of thoracic region: Secondary | ICD-10-CM | POA: Diagnosis not present

## 2021-08-19 DIAGNOSIS — M6281 Muscle weakness (generalized): Secondary | ICD-10-CM | POA: Diagnosis not present

## 2021-08-19 DIAGNOSIS — M25511 Pain in right shoulder: Secondary | ICD-10-CM | POA: Diagnosis not present

## 2021-08-19 DIAGNOSIS — M792 Neuralgia and neuritis, unspecified: Secondary | ICD-10-CM | POA: Diagnosis not present

## 2021-08-19 DIAGNOSIS — M9908 Segmental and somatic dysfunction of rib cage: Secondary | ICD-10-CM | POA: Diagnosis not present

## 2021-08-19 DIAGNOSIS — M5412 Radiculopathy, cervical region: Secondary | ICD-10-CM | POA: Diagnosis not present

## 2021-08-19 DIAGNOSIS — M9907 Segmental and somatic dysfunction of upper extremity: Secondary | ICD-10-CM | POA: Diagnosis not present

## 2021-08-19 DIAGNOSIS — M542 Cervicalgia: Secondary | ICD-10-CM | POA: Diagnosis not present

## 2021-08-19 NOTE — Telephone Encounter (Signed)
We have attempted to call the patient 2 times to schedule sleep study. Patient has been unavailable at the phone numbers we have on file and has not returned our calls. If patient calls back we will schedule them for their sleep study. ° °

## 2021-08-20 ENCOUNTER — Other Ambulatory Visit: Payer: Self-pay

## 2021-08-20 DIAGNOSIS — M25532 Pain in left wrist: Secondary | ICD-10-CM

## 2021-08-20 NOTE — Progress Notes (Signed)
Pt called asking for a referral to hand specialist for left hand/wrist and elbow pain.  Referral placed.

## 2021-08-21 ENCOUNTER — Other Ambulatory Visit (HOSPITAL_COMMUNITY): Payer: Self-pay

## 2021-08-21 MED ORDER — TRIAMCINOLONE ACETONIDE 0.1 % EX OINT
TOPICAL_OINTMENT | CUTANEOUS | 0 refills | Status: DC
  Filled 2021-08-21: qty 100, 30d supply, fill #0

## 2021-08-22 ENCOUNTER — Ambulatory Visit (INDEPENDENT_AMBULATORY_CARE_PROVIDER_SITE_OTHER): Payer: Medicare Other

## 2021-08-22 ENCOUNTER — Ambulatory Visit: Payer: Self-pay

## 2021-08-22 ENCOUNTER — Other Ambulatory Visit: Payer: Self-pay

## 2021-08-22 ENCOUNTER — Ambulatory Visit (INDEPENDENT_AMBULATORY_CARE_PROVIDER_SITE_OTHER): Payer: Medicare Other | Admitting: Orthopedic Surgery

## 2021-08-22 DIAGNOSIS — M25522 Pain in left elbow: Secondary | ICD-10-CM

## 2021-08-22 DIAGNOSIS — M79642 Pain in left hand: Secondary | ICD-10-CM

## 2021-08-22 NOTE — Progress Notes (Signed)
Office Visit Note   Patient: Felicia Cain           Date of Birth: [birth year]           MRN: [id number] Visit Date: 08/22/2021              Requested by: Thurman Coyer, DO [address],  [place] [address] PCP: Lajean Manes, MD   Assessment & Plan: Visit Diagnoses:  1. Pain in left elbow   2. Pain in left hand     Plan: Discussed with patient that her elbow pain seems like epicondylitis/tendonitis involving both the extensor and flexor origins.  Her pain is diffuse and somewhat vague.  We reviewed her elbow x-rays which were largely unremarkable.  She also has diffuse and vague pain around her thumb with well maintained joint spaces at the [facility] and STT joints.  We discussed the role of therapy in treating epicondylitis.  She wants to try formal therapy to work on stretching, strengthening, and pain modalities.  She also wants to try topical diclofenac.  I can see her back in a few months to see if she's made any progress with therapy.   Follow-Up Instructions: No follow-ups on file.   Orders:  Orders Placed This Encounter  Procedures   XR Elbow 2 Views Left   XR Hand Complete Left   No orders of the defined types were placed in this encounter.     Procedures: No procedures performed   Clinical Data: No additional findings.   Subjective: Chief Complaint  Patient presents with   Left Elbow - Pain    This is a 65 year old right-hand-dominant female who presents with multiple complaints involving left upper extremity.  She says most of her pain is around her left elbow since a fall back in June or July.  She describes pain to both the medial and lateral sides originating at the epicondyles.  She has pain that radiates to the epicondyles distally into both the dorsal and volar forearm.  She has tried some exercises occasionally but has not done anything consistently.  She has tried oral ibuprofen which she thinks does not help much.  She also  describes pain at the base of her left thumb around the [facility] joint.  She describes this as a pulling type sensation.  She denies any previous treatment of this thumb.  She does wear a soft neoprene type sleeve over the thumb.  She denies any numbness or paresthesias around the thumb or in the fingers.  She also has a trigger finger on the left ring finger.  She had 3 corticosteroid ejections that she is notes have helped somewhat.   Review of Systems   Objective: Vital Signs: There were no vitals taken for this visit.  Physical Exam Constitutional:      Appearance: Normal appearance.  Cardiovascular:     Rate and Rhythm: Normal rate.     Pulses: Normal pulses.  Pulmonary:     Effort: Pulmonary effort is normal.  Skin:    General: Skin is warm and dry.     Capillary Refill: Capillary refill takes less than 2 seconds.  Neurological:     Mental Status: She is alert.    Left Hand Exam   Tenderness  Left hand tenderness location: TTP around dorsal aspect of thumb from first webspace to [facility] joint.   Range of Motion  The patient has normal left wrist ROM.  Other  Erythema: absent Sensation: normal  Pulse: present  Comments:  Pain w/ CMC grind test but without crepitus.  TTP at first webspace.  Pain w/ resisted thumb extension.  Pain in forearm w/ resisted forearm pronation and supination. Negative Finkelstein test but pain with palpation of first dorsal compartment.    Left Elbow Exam   Tenderness  Left elbow tenderness location: TTP at medial and lateral epicondyles and extending several centimeters distally.   Range of Motion  The patient has normal left elbow ROM.  Muscle Strength  The patient has normal left elbow strength.  Other  Erythema: absent Sensation: normal Pulse: present  Comments:  Full and painless AROM of elbow.  Discomfort with resisted elbow and middle finger extension with elbow extended.       Specialty Comments:  No specialty comments  available.  Imaging: 3 views of the left hand taken today reviewed interpreted by me.  They demonstrate well-maintained radiocarpal and midcarpal joint spaces.  There is no evidence of CMC arthritis with well-maintained joint space.  No hyperextension deformity present at the MP joint without any degenerative changes.  X-ray is largely unremarkable.  2 views left elbow taken today reviewed interpreted by me.  They demonstrate well-maintained radiocapitellar and ulnohumeral joint spaces.  There is no evidence of osteophytes or other degenerative changes present.   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 ([place]) 09/20/2019   Peroneal neuropathy at knee, left 03/19/2019   Patellar contusion, left, sequela 02/09/2019   Rib pain on right side 11/24/2018   Acquired absence of both breasts 09/20/2018   Ruptured right breast implant 08/26/2018   History of reconstruction of both breasts 07/08/2018   Breast implant capsular contracture 07/08/2018   Breast pain 07/08/2018   Retrognathia 09/15/2017   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   Status post-operative repair of closed fracture of right hip 09/08/2017   Acute pain of right knee 05/24/2017   Hip fracture ([place]) 01/25/2017   Closed hip fracture, right, initial encounter ([place])    Preop examination    Fibromyalgia    Neuropathy    Corneal abrasion    Nausea    Fall    Seronegative myasthenia gravis ([place]) 07/19/2015   Acute exacerbation of chronic low back pain 07/19/2015   Leukocytosis 07/19/2015   Myasthenia gravis in remission ([place]) 10/23/2014   OSA on CPAP 10/23/2014   Hypersomnia, persistent 10/23/2014   Hypoxemia 08/14/2014   Myasthenia gravis with exacerbation ([place]) 08/14/2014    Myasthenia gravis with exacerbation, ocular ([place]) 12/08/2012   Right shoulder pain 08/02/2012   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   History of optic neuritis 06/27/2012   Rosacea 06/27/2012   S/P laminectomy 06/27/2012   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   S/P bilateral mastectomy 06/26/2012   BRCA negative 06/26/2012   GERD (gastroesophageal reflux disease) 06/26/2012   Menopause 06/26/2012   Ankle pain 06/02/2012   Past Medical History:  Diagnosis Date   Anxiety    Arthritis    "back" (01/25/2017)   Brachial neuritis    neuropathy   Chronic lower back pain    DCIS (ductal carcinoma in situ)    "left side?"   Esophageal stricture    stricture with dysphasia   Fibromyalgia    GERD (gastroesophageal reflux disease)  H/O Doppler ultrasound 2006   see scanned study   H/O echocardiogram 2004, 2013   Hepatitis A 1961   "epidemic in my city"   History of cardiac monitoring 2006   History of nuclear stress test 2004   see scanned study   Migraine    "in the past; cycle related" (01/25/2017)   Myasthenia gravis in crisis [place]) 04/2014   respiratory crisis   NAION (non-arteritic anterior ischemic optic neuropathy), right    Near syncope    Neuromuscular disorder (HCC)    Neuropathy    Optic neuritis    ischaemic optic neuritis, non arteric   Optic neuropathy    PONV (postoperative nausea and vomiting)    Posterior optic neuritis    Ptosis of eyelid    Sleep apnea    "I wear Moses device; I don't wear CPAP" (01/25/2017)   Spinal stenosis     Family History  Problem Relation Age of Onset   Stroke Mother    Hypertension Mother    Early death Father    Cancer Father 49       sarcoma   Parkinson's disease Maternal Aunt    Cancer Maternal Uncle    Cancer Paternal Grandmother        breast    Past Surgical History:  Procedure Laterality Date   AUGMENTATION MAMMAPLASTY     2000, after breast cancer surgery,redone 2010   BACK SURGERY     BREAST BIOPSY      BREAST CAPSULECTOMY WITH IMPLANT EXCHANGE Bilateral 09/28/2018   Procedure: REMOVAL OF BILATERAL BREAST IMPLANTS WITH CAPSULECTOMIES AND REPLACEMENTS OF IMPLANTS;  Surgeon: Wallace Going, DO;  Location: [facility];  Service: Plastics;  Laterality: Bilateral;   CARPAL TUNNEL RELEASE Bilateral    CESAREAN SECTION  1989   ESOPHAGOGASTRODUODENOSCOPY (EGD) WITH ESOPHAGEAL DILATION     HIP PINNING,CANNULATED Right 01/26/2017   Procedure: CANNULATED SCREWS/RIGHT HIP PINNING;  Surgeon: Mcarthur Rossetti, MD;  Location: [facility];  Service: Orthopedics;  Laterality: Right;   INNER EAR SURGERY Right 1995   LUMBAR LAMINECTOMY Right 1999   [name]   MASTECTOMY Bilateral    Social History   Occupational History   Not on file  Tobacco Use   Smoking status: Never   Smokeless tobacco: Never  Vaping Use   Vaping Use: Never used  Substance and Sexual Activity   Alcohol use: No   Drug use: No   Sexual activity: Not Currently

## 2021-08-25 ENCOUNTER — Other Ambulatory Visit (HOSPITAL_COMMUNITY): Payer: Self-pay

## 2021-08-26 ENCOUNTER — Other Ambulatory Visit (HOSPITAL_COMMUNITY): Payer: Self-pay

## 2021-09-02 ENCOUNTER — Other Ambulatory Visit (HOSPITAL_COMMUNITY): Payer: Self-pay

## 2021-09-02 MED ORDER — DENOSUMAB 60 MG/ML ~~LOC~~ SOSY
PREFILLED_SYRINGE | SUBCUTANEOUS | 1 refills | Status: DC
  Filled 2021-09-02: qty 1, fill #0
  Filled 2021-09-02 – 2021-09-05 (×2): qty 1, 180d supply, fill #0

## 2021-09-02 MED FILL — Finasteride Tab 5 MG: ORAL | 30 days supply | Qty: 15 | Fill #4 | Status: AC

## 2021-09-03 ENCOUNTER — Other Ambulatory Visit: Payer: Self-pay

## 2021-09-03 ENCOUNTER — Encounter: Payer: Self-pay | Admitting: Rehabilitative and Restorative Service Providers"

## 2021-09-03 ENCOUNTER — Ambulatory Visit (INDEPENDENT_AMBULATORY_CARE_PROVIDER_SITE_OTHER): Payer: Medicare Other | Admitting: Rehabilitative and Restorative Service Providers"

## 2021-09-03 DIAGNOSIS — M25522 Pain in left elbow: Secondary | ICD-10-CM | POA: Diagnosis not present

## 2021-09-03 DIAGNOSIS — M9907 Segmental and somatic dysfunction of upper extremity: Secondary | ICD-10-CM | POA: Diagnosis not present

## 2021-09-03 DIAGNOSIS — M25622 Stiffness of left elbow, not elsewhere classified: Secondary | ICD-10-CM

## 2021-09-03 DIAGNOSIS — M9903 Segmental and somatic dysfunction of lumbar region: Secondary | ICD-10-CM | POA: Diagnosis not present

## 2021-09-03 DIAGNOSIS — M6281 Muscle weakness (generalized): Secondary | ICD-10-CM | POA: Diagnosis not present

## 2021-09-03 DIAGNOSIS — M9904 Segmental and somatic dysfunction of sacral region: Secondary | ICD-10-CM | POA: Diagnosis not present

## 2021-09-03 DIAGNOSIS — M9905 Segmental and somatic dysfunction of pelvic region: Secondary | ICD-10-CM | POA: Diagnosis not present

## 2021-09-03 DIAGNOSIS — M25632 Stiffness of left wrist, not elsewhere classified: Secondary | ICD-10-CM | POA: Diagnosis not present

## 2021-09-03 DIAGNOSIS — M25552 Pain in left hip: Secondary | ICD-10-CM | POA: Diagnosis not present

## 2021-09-03 DIAGNOSIS — M9906 Segmental and somatic dysfunction of lower extremity: Secondary | ICD-10-CM | POA: Diagnosis not present

## 2021-09-03 NOTE — Therapy (Signed)
[facility] [address] [place], [place], [address] Phone: [phone]   Fax:  [phone]  Physical Therapy Evaluation  Patient Details  Name: Felicia Cain MRN: [id number] Date of Birth: [birth year] Referring Provider (PT): Sherilyn Cooter MD   Encounter Date: 09/03/2021   PT End of Session - 09/03/21 1623     Visit Number 1    Number of Visits 10    Date for PT Re-Evaluation 11/12/21    Authorization Type Medicare    Progress Note Due on Visit 10    PT Start Time 1100    PT Stop Time 1150    PT Time Calculation (min) 50 min    Activity Tolerance Patient tolerated treatment well    Behavior During Therapy [facility] for tasks assessed/performed             Past Medical History:  Diagnosis Date   Anxiety    Arthritis    "back" (01/25/2017)   Brachial neuritis    neuropathy   Chronic lower back pain    DCIS (ductal carcinoma in situ)    "left side?"   Esophageal stricture    stricture with dysphasia   Fibromyalgia    GERD (gastroesophageal reflux disease)    H/O Doppler ultrasound 2006   see scanned study   H/O echocardiogram 2004, 2013   Hepatitis A 1961   [place]"   History of cardiac monitoring 2006   History of nuclear stress test 2004   see scanned study   Migraine    "in the past; cycle related" (01/25/2017)   Myasthenia gravis in crisis ([place]) 04/2014   respiratory crisis   NAION (non-arteritic anterior ischemic optic neuropathy), right    Near syncope    Neuromuscular disorder (HCC)    Neuropathy    Optic neuritis    ischaemic optic neuritis, non arteric   Optic neuropathy    PONV (postoperative nausea and vomiting)    Posterior optic neuritis    Ptosis of eyelid    Sleep apnea    "I wear Moses device; I don't wear CPAP" (01/25/2017)   Spinal stenosis     Past Surgical History:  Procedure Laterality Date   AUGMENTATION MAMMAPLASTY     2000, after breast cancer surgery,redone 2010   BACK  SURGERY     BREAST BIOPSY     BREAST CAPSULECTOMY WITH IMPLANT EXCHANGE Bilateral 09/28/2018   Procedure: REMOVAL OF BILATERAL BREAST IMPLANTS WITH CAPSULECTOMIES AND REPLACEMENTS OF IMPLANTS;  Surgeon: Wallace Going, DO;  Location: [facility];  Service: Plastics;  Laterality: Bilateral;   CARPAL TUNNEL RELEASE Bilateral    CESAREAN SECTION  1989   ESOPHAGOGASTRODUODENOSCOPY (EGD) WITH ESOPHAGEAL DILATION     HIP PINNING,CANNULATED Right 01/26/2017   Procedure: CANNULATED SCREWS/RIGHT HIP PINNING;  Surgeon: Mcarthur Rossetti, MD;  Location: [facility];  Service: Orthopedics;  Laterality: Right;   INNER EAR SURGERY Right 1995   LUMBAR LAMINECTOMY Right 1999   [name]   MASTECTOMY Bilateral     There were no vitals filed for this visit.    Subjective Assessment - 09/03/21 1619     Subjective Felicia Cain had a fall where she landed on her medial R elbow on a table.  The elbow has been swollen and painful since the fall.  She has also started to develop lateral elbow symptoms recently.  MRI negative for additional damage.    Pertinent History Myasthenia Gravis, neuropathy, previous R hip fracture, RA, previous  laminectomy, B mastectomy, B Carpal tunnel release    Limitations Reading;Writing;Lifting;House hold activities    Patient Stated Goals Get rid of L elbow pain so she can play piano, hold a book and return to Yoga and Piliates    Currently in Pain? Yes    Pain Score 4     Pain Location Elbow    Pain Orientation Left;Medial;Lateral    Pain Descriptors / Indicators Aching;Sharp;Constant;Sore    Pain Type Chronic pain    Pain Radiating Towards Notes some hand pain (possibly RA)    Pain Onset More than a month ago    Pain Frequency Constant    Aggravating Factors  Repeated L UE use    Pain Relieving Factors Rest or light use    Effect of Pain on Daily Activities Unable to read (hold a book), play piano, participate in Yoga or Pilates    Multiple Pain Sites No                 [facility] PT Assessment - 09/03/21 0001       Assessment   Medical Diagnosis L medial and lateral epicondylitis    Referring Provider (PT) Sherilyn Cooter MD    Onset Date/Surgical Date --   July 2022   Hand Dominance Right    Next MD Visit 1 month    Prior Therapy No      Precautions   Precaution Comments Avoid overuse      Restrictions   Weight Bearing Restrictions Yes    LUE Weight Bearing Non weight bearing    Other Position/Activity Restrictions Avoid overuse (gripping)      Balance Screen   Has the patient fallen in the past 6 months Yes    How many times? 1    Has the patient had a decrease in activity level because of a fear of falling?  No    Is the patient reluctant to leave their home because of a fear of falling?  No      Home Ecologist residence    Additional Comments Problems with stairs and holding items (drops items frequently)      Prior Function   Level of Independence Independent    Vocation Retired    Biomedical scientist MD    Leisure Play piano, read      Cognition   Overall Cognitive Status Within Functional Limits for tasks assessed      Observation/Other Assessments   Focus on Therapeutic Outcomes (FOTO)  48 (Goal 64 in 10 visits)      ROM / Strength   AROM / PROM / Strength AROM;Strength      AROM   Overall AROM  Deficits    AROM Assessment Site Elbow;Wrist    Right/Left Elbow Left;Right    Right Elbow Flexion 147    Right Elbow Extension -8    Left Elbow Flexion 138    Left Elbow Extension -12    Right/Left Wrist Right    Right Wrist Extension 70 Degrees    Right Wrist Flexion 65 Degrees    Left Wrist Extension 60 Degrees    Left Wrist Flexion 60 Degrees      Strength   Overall Strength Deficits    Overall Strength Comments Grip L/R in pounds (39.9/55.7 pounds)    Strength Assessment Site Wrist    Right/Left Wrist Left;Right    Right Wrist Flexion --   14.3 pounds   Right Wrist  Extension --  8.4 pounds   Left Wrist Flexion --   7.5 pounds   Left Wrist Extension --   8.9 pounds                       Objective measurements completed on examination: See above findings.       [facility] Adult PT Treatment/Exercise - 09/03/21 0001       Therapeutic Activites    Therapeutic Activities Other Therapeutic Activities    Other Therapeutic Activities Looked at elbow staps/bracing, discussed other modalities, pros and cons, reviewed exam findings and discussed POC.      Exercises   Exercises Wrist;Elbow      Elbow Exercises   Elbow Flexion AROM;Left;10 reps;Standing;Limitations    Elbow Flexion Limitations 3 seconds (R fist above L elbow)    Elbow Extension AROM;Left;10 reps;Standing;Limitations    Elbow Extension Limitations 3 seconds (R fist above L elbow)      Wrist Exercises   Wrist Flexion AAROM;Strengthening;Left;15 reps;Bar weights/barbell;Limitations    Bar Weights/Barbell (Wrist Flexion) 1 lb    Wrist Flexion Limitations Palm up drop eccentrics    Wrist Extension AAROM;Strengthening;Left;15 reps;Seated;Limitations    Bar Weights/Barbell (Wrist Extension) 1 lb    Wrist Extension Limitations Palm down drop eccentrics                     PT Education - 09/03/21 1622     Education Details Reviewed exam findings, basic mechanism of injury, expected time for recovery (2-3 months) and starter HEP.    Person(s) Educated Patient    Methods Explanation;Demonstration;Tactile cues;Verbal cues;Handout    Comprehension Verbalized understanding;Tactile cues required;Need further instruction;Returned demonstration;Verbal cues required              PT Short Term Goals - 09/03/21 1630       PT SHORT TERM GOAL #1   Title Improve L grip strength to 45 pounds.    Baseline 39.9 pounds    Time 4    Period Weeks    Status New    Target Date 10/01/21      PT SHORT TERM GOAL #2   Title Improve L elbow AROM to 100% of the uninvolved R.     Baseline -12 (vs -8) extension and 138 flexion (vs 147)    Time 4    Period Weeks    Status New    Target Date 10/01/21      PT SHORT TERM GOAL #3   Title Improve L wrist AROM for flexion to 65 degrees and extension to 70 degrees.    Baseline Both 60 degrees    Time 4    Period Weeks    Status New    Target Date 10/01/21               PT Long Term Goals - 09/03/21 1633       PT LONG TERM GOAL #1   Title Improve FOTO to 64 (was 48).    Baseline 48    Time 10    Period Weeks    Status New    Target Date 11/12/21      PT LONG TERM GOAL #2   Title Improve L wrist flexion and extension strength to at least 15 pounds.    Baseline 7.5 and 8.9 pounds, respectively    Time 10    Period Weeks    Status New    Target Date 11/12/21      PT  LONG TERM GOAL #3   Title Improve L grip strength to 90% or better compared to the uninvolved R.    Baseline 71%    Time 10    Period Weeks    Status New    Target Date 11/12/21      PT LONG TERM GOAL #4   Title Felicia Cain will be independent with her long-term HEP at [place].    Baseline Started today    Time 10    Period Weeks    Status New    Target Date 11/12/21                    Plan - 09/03/21 1625     Clinical Impression Statement Felicia Cain's original trauma injury (fall on medial elbow) is now chronic with medial and lateral epicondylitis.  She has elbow and wrist stiffness, weakness and dysfunction.  She is a great PT candidate, although her recovery will likely be a 2-3 month process given her time since injury.    Personal Factors and Comorbidities Comorbidity 3+    Comorbidities Myasthenia Gravis, neuropathy, previous R hip fracture, RA, previous laminectomy, B mastectomy, B Carpal tunnel release    Examination-Activity Limitations Reach Overhead;Self Feeding;Dressing;Bend;Lift;Carry    Examination-Participation Restrictions Community Activity;Driving    Stability/Clinical Decision Making Stable/Uncomplicated     Rehab Potential Good    PT Frequency --   1-2X/week for 10 weeks   PT Duration Other (comment)   10 weeks   PT Treatment/Interventions ADLs/Self Care Home Management;Electrical Stimulation;Cryotherapy;Iontophoresis 19m/ml Dexamethasone;Ultrasound;Therapeutic activities;Neuromuscular re-education;Therapeutic exercise;Patient/family education;Manual techniques;Dry needling    PT Next Visit Plan Review drop eccentrics and elbow AROM, wrist flexors and extensors strength, neutral wrist elbow/wrist strengthening (isometrics, shoulder light theraband).    PT Home Exercise Plan Access Code: [email or web address]    Consulted and Agree with Plan of Care Patient             Patient will benefit from skilled therapeutic intervention in order to improve the following deficits and impairments:  Decreased endurance, Decreased range of motion, Decreased strength, Increased edema, Impaired perceived functional ability, Impaired UE functional use, Pain  Visit Diagnosis: Muscle weakness (generalized)  Pain in left elbow  Stiffness of left elbow, not elsewhere classified  Stiffness of left wrist, not elsewhere classified     Problem List Patient Active Problem List   Diagnosis Date Noted   Tremor of right hand 07/23/2021   Titubation 07/23/2021   Weakness of both lower extremities 01/20/2021   Gait instability 01/20/2021   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 ([place] 09/20/2019   Peroneal neuropathy at knee, left 03/19/2019   Patellar contusion, left, sequela 02/09/2019   Rib pain on right side 11/24/2018   Acquired absence of both breasts 09/20/2018   Ruptured right breast implant 08/26/2018   History of reconstruction of both breasts 07/08/2018   Breast implant capsular contracture 07/08/2018   Breast pain 07/08/2018   Retrognathia 09/15/2017   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   Status post-operative repair of closed fracture of right hip 09/08/2017    Acute pain of right knee 05/24/2017   Hip fracture ([place] 01/25/2017   Closed hip fracture, right, initial encounter ([place]    Preop examination    Fibromyalgia    Neuropathy    Corneal abrasion    Nausea    Fall    Seronegative myasthenia gravis ([place] 07/19/2015   Acute exacerbation of chronic low back pain 07/19/2015   Leukocytosis 07/19/2015  Myasthenia gravis in remission ([place]) 10/23/2014   OSA on CPAP 10/23/2014   Hypersomnia, persistent 10/23/2014   Hypoxemia 08/14/2014   Myasthenia gravis with exacerbation ([place]) 08/14/2014   Myasthenia gravis with exacerbation, ocular ([place]) 12/08/2012   Right shoulder pain 08/02/2012   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   History of optic neuritis 06/27/2012   Rosacea 06/27/2012   S/P laminectomy 06/27/2012   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   S/P bilateral mastectomy 06/26/2012   BRCA negative 06/26/2012   GERD (gastroesophageal reflux disease) 06/26/2012   Menopause 06/26/2012   Ankle pain 06/02/2012    Farley Ly, PT, MPT 09/03/2021, 4:39 PM  [facility] [address] [place], [place], [address] Phone: [phone]   Fax:  [phone]  Name: Felicia Cain MRN: [id number] Date of Birth: 1955-11-23

## 2021-09-03 NOTE — Patient Instructions (Signed)
Access Code: [email or web address] URL: [email or web address] Date: 09/03/2021 Prepared by: Vista Mink  Exercises Quad Setting and Stretching - 2-4 x daily - 7 x weekly - 3 sets - 20 reps - prop 5-10 minutes & quad set5 seconds hold Supine Straight Leg Raises - 2-3 x daily - 7 x weekly - 2 sets - 10 reps - 5 seconds hold Standard Plank - 2-4 x daily - 7 x weekly - 2 sets - 10 reps - 5 seconds hold Prone Hip Extension - 2-4 x daily - 7 x weekly - 2 sets - 10 reps - 5 seconds hold Seated Hamstring Set - 3-5 x daily - 7 x weekly - 1 sets - 10 reps - 5 seconds hold

## 2021-09-05 ENCOUNTER — Other Ambulatory Visit (HOSPITAL_COMMUNITY): Payer: Self-pay

## 2021-09-08 ENCOUNTER — Other Ambulatory Visit (HOSPITAL_COMMUNITY): Payer: Self-pay

## 2021-09-16 DIAGNOSIS — M9905 Segmental and somatic dysfunction of pelvic region: Secondary | ICD-10-CM | POA: Diagnosis not present

## 2021-09-16 DIAGNOSIS — R531 Weakness: Secondary | ICD-10-CM | POA: Diagnosis not present

## 2021-09-16 DIAGNOSIS — M9908 Segmental and somatic dysfunction of rib cage: Secondary | ICD-10-CM | POA: Diagnosis not present

## 2021-09-16 DIAGNOSIS — M9906 Segmental and somatic dysfunction of lower extremity: Secondary | ICD-10-CM | POA: Diagnosis not present

## 2021-09-16 DIAGNOSIS — M25551 Pain in right hip: Secondary | ICD-10-CM | POA: Diagnosis not present

## 2021-09-16 DIAGNOSIS — M9903 Segmental and somatic dysfunction of lumbar region: Secondary | ICD-10-CM | POA: Diagnosis not present

## 2021-09-16 DIAGNOSIS — M9904 Segmental and somatic dysfunction of sacral region: Secondary | ICD-10-CM | POA: Diagnosis not present

## 2021-09-16 DIAGNOSIS — M79621 Pain in right upper arm: Secondary | ICD-10-CM | POA: Diagnosis not present

## 2021-09-16 DIAGNOSIS — M25511 Pain in right shoulder: Secondary | ICD-10-CM | POA: Diagnosis not present

## 2021-09-16 DIAGNOSIS — M9902 Segmental and somatic dysfunction of thoracic region: Secondary | ICD-10-CM | POA: Diagnosis not present

## 2021-09-17 ENCOUNTER — Other Ambulatory Visit (HOSPITAL_COMMUNITY): Payer: Self-pay

## 2021-09-17 DIAGNOSIS — M810 Age-related osteoporosis without current pathological fracture: Secondary | ICD-10-CM | POA: Diagnosis not present

## 2021-09-18 ENCOUNTER — Ambulatory Visit (INDEPENDENT_AMBULATORY_CARE_PROVIDER_SITE_OTHER): Payer: Medicare Other | Admitting: Physical Therapy

## 2021-09-18 ENCOUNTER — Encounter: Payer: Self-pay | Admitting: Physical Therapy

## 2021-09-18 ENCOUNTER — Other Ambulatory Visit: Payer: Self-pay

## 2021-09-18 DIAGNOSIS — M25632 Stiffness of left wrist, not elsewhere classified: Secondary | ICD-10-CM

## 2021-09-18 DIAGNOSIS — M25622 Stiffness of left elbow, not elsewhere classified: Secondary | ICD-10-CM | POA: Diagnosis not present

## 2021-09-18 DIAGNOSIS — M6281 Muscle weakness (generalized): Secondary | ICD-10-CM | POA: Diagnosis not present

## 2021-09-18 DIAGNOSIS — M25522 Pain in left elbow: Secondary | ICD-10-CM

## 2021-09-18 NOTE — Therapy (Signed)
[facility] [address] [place], [place], [address] Phone: [phone]   Fax:  [phone]  Physical Therapy Treatment  Patient Details  Name: Felicia Cain MRN: [id number] Date of Birth: May 31, 1956 Referring Provider (PT): Sherilyn Cooter MD   Encounter Date: 09/18/2021   PT End of Session - 09/18/21 1342     Visit Number 2    Number of Visits 10    Date for PT Re-Evaluation 11/12/21    Authorization Type Medicare    Progress Note Due on Visit 10    PT Start Time 1300    PT Stop Time 0762    PT Time Calculation (min) 38 min    Activity Tolerance Patient tolerated treatment well    Behavior During Therapy [facility] for tasks assessed/performed             Past Medical History:  Diagnosis Date   Anxiety    Arthritis    "back" (01/25/2017)   Brachial neuritis    neuropathy   Chronic lower back pain    DCIS (ductal carcinoma in situ)    "left side?"   Esophageal stricture    stricture with dysphasia   Fibromyalgia    GERD (gastroesophageal reflux disease)    H/O Doppler ultrasound 2006   see scanned study   H/O echocardiogram 2004, 2013   Hepatitis A 1961   [place]"   History of cardiac monitoring 2006   History of nuclear stress test 2004   see scanned study   Migraine    "in the past; cycle related" (01/25/2017)   Myasthenia gravis in crisis ([place]) 04/2014   respiratory crisis   NAION (non-arteritic anterior ischemic optic neuropathy), right    Near syncope    Neuromuscular disorder (HCC)    Neuropathy    Optic neuritis    ischaemic optic neuritis, non arteric   Optic neuropathy    PONV (postoperative nausea and vomiting)    Posterior optic neuritis    Ptosis of eyelid    Sleep apnea    "I wear Moses device; I don't wear CPAP" (01/25/2017)   Spinal stenosis     Past Surgical History:  Procedure Laterality Date   AUGMENTATION MAMMAPLASTY     2000, after breast cancer surgery,redone 2010   BACK  SURGERY     BREAST BIOPSY     BREAST CAPSULECTOMY WITH IMPLANT EXCHANGE Bilateral 09/28/2018   Procedure: REMOVAL OF BILATERAL BREAST IMPLANTS WITH CAPSULECTOMIES AND REPLACEMENTS OF IMPLANTS;  Surgeon: Wallace Going, DO;  Location: [facility];  Service: Plastics;  Laterality: Bilateral;   CARPAL TUNNEL RELEASE Bilateral    CESAREAN SECTION  1989   ESOPHAGOGASTRODUODENOSCOPY (EGD) WITH ESOPHAGEAL DILATION     HIP PINNING,CANNULATED Right 01/26/2017   Procedure: CANNULATED SCREWS/RIGHT HIP PINNING;  Surgeon: Mcarthur Rossetti, MD;  Location: [facility];  Service: Orthopedics;  Laterality: Right;   INNER EAR SURGERY Right 1995   LUMBAR LAMINECTOMY Right 1999   [name]   MASTECTOMY Bilateral     There were no vitals filed for this visit.   Subjective Assessment - 09/18/21 1302     Subjective "I'm in pain all the time, and it's worse.  I need manual therapy."    Pertinent History Myasthenia Gravis, neuropathy, previous R hip fracture, RA, previous laminectomy, B mastectomy, B Carpal tunnel release    Limitations Reading;Writing;Lifting;House hold activities    Patient Stated Goals Get rid of L elbow pain so she can play  piano, hold a book and return to Yoga and Piliates    Currently in Pain? Yes    Pain Score 6     Pain Location Elbow    Pain Orientation Left;Medial;Lower    Pain Descriptors / Indicators Aching;Sharp;Sore;Constant    Pain Type Chronic pain    Pain Onset More than a month ago    Pain Frequency Constant    Aggravating Factors  any activity with LUE    Pain Relieving Factors rest, light use                               [facility] Adult PT Treatment/Exercise - 09/18/21 1341       Elbow Exercises   Other elbow exercises wrist flexion and extension stretch x30 sec each and encouraged to perform at home      Manual Therapy   Manual therapy comments STM with compression and IASTM to wrist extensors/flexors and biceps on Lt; skilled  palpation and monitoring of soft tissue during DN              Trigger Point Dry Needling - 09/18/21 1313     Consent Given? Yes    Education Handout Provided Yes    Muscles Treated Upper Quadrant Biceps    Muscles Treated Wrist/Hand Extensor digitorum;Extensor carpi ulnaris;Flexor carpi radialis;Flexor carpi ulnaris;Extensor carpi radialis longus/brevis    Electrical Stimulation Performed with Dry Needling Yes    E-stim with Dry Needling Details to tolerance to wrist extensors x 5 min    Biceps Response Twitch response elicited    Flexor carpi radialis Response Twitch response elicited    Flexor carpi ulnaris Response Twitch response elicited    Extensor carpi radialis longus/brevis Response Twitch response elicited    Extensor digitorum Response Twitch response elicited;Palpable increased muscle length    Extensor carpi ulnaris Response Twitch response elicited                   PT Education - 09/18/21 1342     Education Details DN    Person(s) Educated Patient    Methods Explanation;Handout    Comprehension Verbalized understanding              PT Short Term Goals - 09/03/21 1630       PT SHORT TERM GOAL #1   Title Improve L grip strength to 45 pounds.    Baseline 39.9 pounds    Time 4    Period Weeks    Status New    Target Date 10/01/21      PT SHORT TERM GOAL #2   Title Improve L elbow AROM to 100% of the uninvolved R.    Baseline -12 (vs -8) extension and 138 flexion (vs 147)    Time 4    Period Weeks    Status New    Target Date 10/01/21      PT SHORT TERM GOAL #3   Title Improve L wrist AROM for flexion to 65 degrees and extension to 70 degrees.    Baseline Both 60 degrees    Time 4    Period Weeks    Status New    Target Date 10/01/21               PT Long Term Goals - 09/03/21 1633       PT LONG TERM GOAL #1   Title Improve FOTO to 64 (was 48).  Baseline 48    Time 10    Period Weeks    Status New    Target Date  11/12/21      PT LONG TERM GOAL #2   Title Improve L wrist flexion and extension strength to at least 15 pounds.    Baseline 7.5 and 8.9 pounds, respectively    Time 10    Period Weeks    Status New    Target Date 11/12/21      PT LONG TERM GOAL #3   Title Improve L grip strength to 90% or better compared to the uninvolved R.    Baseline 71%    Time 10    Period Weeks    Status New    Target Date 11/12/21      PT LONG TERM GOAL #4   Title Makenlee will be independent with her long-term HEP at [place].    Baseline Started today    Time 10    Period Weeks    Status New    Target Date 11/12/21                   Plan - 09/18/21 1342     Clinical Impression Statement Pt initially arriving to PT frustrated with pain and progress at this time. Trial of manual and DN with estim today with no increase in pain noted, and mild reduction in tenderness at trigger points noted.  Will continue to benefit from PT to maximize function.    Personal Factors and Comorbidities Comorbidity 3+    Comorbidities Myasthenia Gravis, neuropathy, previous R hip fracture, RA, previous laminectomy, B mastectomy, B Carpal tunnel release    Examination-Activity Limitations Reach Overhead;Self Feeding;Dressing;Bend;Lift;Carry    Examination-Participation Restrictions Community Activity;Driving    Stability/Clinical Decision Making Stable/Uncomplicated    Rehab Potential Good    PT Frequency --   1-2X/week for 10 weeks   PT Duration Other (comment)   10 weeks   PT Treatment/Interventions ADLs/Self Care Home Management;Electrical Stimulation;Cryotherapy;Iontophoresis 33m/ml Dexamethasone;Ultrasound;Therapeutic activities;Neuromuscular re-education;Therapeutic exercise;Patient/family education;Manual techniques;Dry needling    PT Next Visit Plan assess response to DN/manual, incorporate exercises as able, Review drop eccentrics and elbow AROM, wrist flexors and extensors strength, neutral wrist elbow/wrist  strengthening (isometrics, shoulder light theraband).    PT Home Exercise Plan Access Code: [email or web address]    Consulted and Agree with Plan of Care Patient             Patient will benefit from skilled therapeutic intervention in order to improve the following deficits and impairments:  Decreased endurance, Decreased range of motion, Decreased strength, Increased edema, Impaired perceived functional ability, Impaired UE functional use, Pain  Visit Diagnosis: Muscle weakness (generalized)  Pain in left elbow  Stiffness of left elbow, not elsewhere classified  Stiffness of left wrist, not elsewhere classified     Problem List Patient Active Problem List   Diagnosis Date Noted   Tremor of right hand 07/23/2021   Titubation 07/23/2021   Weakness of both lower extremities 01/20/2021   Gait instability 01/20/2021   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 ([place] 09/20/2019   Peroneal neuropathy at knee, left 03/19/2019   Patellar contusion, left, sequela 02/09/2019   Rib pain on right side 11/24/2018   Acquired absence of both breasts 09/20/2018   Ruptured right breast implant 08/26/2018   History of reconstruction of both breasts 07/08/2018   Breast implant capsular contracture 07/08/2018   Breast pain 07/08/2018   Retrognathia 09/15/2017  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   Status post-operative repair of closed fracture of right hip 09/08/2017   Acute pain of right knee 05/24/2017   Hip fracture ([place]) 01/25/2017   Closed hip fracture, right, initial encounter ([place])    Preop examination    Fibromyalgia    Neuropathy    Corneal abrasion    Nausea    Fall    Seronegative myasthenia gravis ([place]) 07/19/2015   Acute exacerbation of chronic low back pain 07/19/2015   Leukocytosis 07/19/2015   Myasthenia gravis in remission ([place]) 10/23/2014   OSA on CPAP 10/23/2014   Hypersomnia, persistent 10/23/2014   Hypoxemia 08/14/2014    Myasthenia gravis with exacerbation ([place]) 08/14/2014   Myasthenia gravis with exacerbation, ocular ([place]) 12/08/2012   Right shoulder pain 08/02/2012   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   History of optic neuritis 06/27/2012   Rosacea 06/27/2012   S/P laminectomy 06/27/2012   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   S/P bilateral mastectomy 06/26/2012   BRCA negative 06/26/2012   GERD (gastroesophageal reflux disease) 06/26/2012   Menopause 06/26/2012   Ankle pain 06/02/2012      Laureen Abrahams, PT, DPT 09/18/21 1:44 PM     [facility] [address] [place], [place], [address] Phone: [phone]   Fax:  [phone]  Name: Felicia Cain MRN: [id number] Date of Birth: 1955-11-29

## 2021-09-19 DIAGNOSIS — R251 Tremor, unspecified: Secondary | ICD-10-CM | POA: Diagnosis not present

## 2021-09-19 DIAGNOSIS — R293 Abnormal posture: Secondary | ICD-10-CM | POA: Diagnosis not present

## 2021-09-19 DIAGNOSIS — G20 Parkinson's disease: Secondary | ICD-10-CM | POA: Diagnosis not present

## 2021-09-19 DIAGNOSIS — G231 Progressive supranuclear ophthalmoplegia [Steele-Richardson-Olszewski]: Secondary | ICD-10-CM | POA: Diagnosis not present

## 2021-09-19 DIAGNOSIS — R258 Other abnormal involuntary movements: Secondary | ICD-10-CM | POA: Diagnosis not present

## 2021-09-22 ENCOUNTER — Telehealth: Payer: Self-pay | Admitting: Neurology

## 2021-09-22 NOTE — Telephone Encounter (Signed)
Pt received an e-mail to asking that prescription for CPAP settings need to be sent to [facility]. Fax: [phone]  Would like a call from the nurse.

## 2021-09-23 ENCOUNTER — Other Ambulatory Visit: Payer: Self-pay | Admitting: Neurology

## 2021-09-23 DIAGNOSIS — G4733 Obstructive sleep apnea (adult) (pediatric): Secondary | ICD-10-CM

## 2021-09-23 DIAGNOSIS — R29898 Other symptoms and signs involving the musculoskeletal system: Secondary | ICD-10-CM

## 2021-09-23 NOTE — Telephone Encounter (Signed)
Her CPAP compliance has been excellent - the patient is using an AutoSet between 5 and 13 cmH2O with 3 cm EPR .

## 2021-09-23 NOTE — Telephone Encounter (Signed)
Called the pt there was no answer LVM advising the pt we received her message. Advised I would forward the order to the fax number there was provided. Advised the pt to either call back or send a message through K[employer] advising what to do.

## 2021-09-25 ENCOUNTER — Ambulatory Visit (INDEPENDENT_AMBULATORY_CARE_PROVIDER_SITE_OTHER): Payer: Medicare Other | Admitting: Rehabilitative and Restorative Service Providers"

## 2021-09-25 ENCOUNTER — Encounter: Payer: Self-pay | Admitting: Rehabilitative and Restorative Service Providers"

## 2021-09-25 ENCOUNTER — Other Ambulatory Visit: Payer: Self-pay

## 2021-09-25 DIAGNOSIS — M25522 Pain in left elbow: Secondary | ICD-10-CM | POA: Diagnosis not present

## 2021-09-25 DIAGNOSIS — M25622 Stiffness of left elbow, not elsewhere classified: Secondary | ICD-10-CM | POA: Diagnosis not present

## 2021-09-25 DIAGNOSIS — M25632 Stiffness of left wrist, not elsewhere classified: Secondary | ICD-10-CM | POA: Diagnosis not present

## 2021-09-25 DIAGNOSIS — M6281 Muscle weakness (generalized): Secondary | ICD-10-CM

## 2021-09-25 NOTE — Therapy (Signed)
[facility] Physical Therapy [address] [place], [place], [address] Phone: [phone]   Fax:  [phone]  Physical Therapy Treatment  Patient Details  Name: Felicia Cain MRN: [id number] Date of Birth: 09/05/1956 Referring Provider (PT): Sherilyn Cooter MD   Encounter Date: 09/25/2021   PT End of Session - 09/25/21 1305     Visit Number 3    Number of Visits 10    Date for PT Re-Evaluation 11/12/21    Authorization Type Medicare    Progress Note Due on Visit 10    PT Start Time 7654    PT Stop Time 1230    PT Time Calculation (min) 45 min    Activity Tolerance Patient tolerated treatment well    Behavior During Therapy [facility] for tasks assessed/performed             Past Medical History:  Diagnosis Date   Anxiety    Arthritis    "back" (01/25/2017)   Brachial neuritis    neuropathy   Chronic lower back pain    DCIS (ductal carcinoma in situ)    "left side?"   Esophageal stricture    stricture with dysphasia   Fibromyalgia    GERD (gastroesophageal reflux disease)    H/O Doppler ultrasound 2006   see scanned study   H/O echocardiogram 2004, 2013   Hepatitis A 1961   [place]ic [place]"   History of cardiac monitoring 2006   History of nuclear stress test 2004   see scanned study   Migraine    "in the past; cycle related" (01/25/2017)   Myasthenia gravis in crisis ([place]) 04/2014   respiratory crisis   NAION (non-arteritic anterior ischemic optic neuropathy), right    Near syncope    Neuromuscular disorder (HCC)    Neuropathy    Optic neuritis    ischaemic optic neuritis, non arteric   Optic neuropathy    PONV (postoperative nausea and vomiting)    Posterior optic neuritis    Ptosis of eyelid    Sleep apnea    "I wear Moses device; I don't wear CPAP" (01/25/2017)   Spinal stenosis     Past Surgical History:  Procedure Laterality Date   AUGMENTATION MAMMAPLASTY     2000, after breast cancer surgery,redone 2010   BACK  SURGERY     BREAST BIOPSY     BREAST CAPSULECTOMY WITH IMPLANT EXCHANGE Bilateral 09/28/2018   Procedure: REMOVAL OF BILATERAL BREAST IMPLANTS WITH CAPSULECTOMIES AND REPLACEMENTS OF IMPLANTS;  Surgeon: Wallace Going, DO;  Location: [place];  Service: Plastics;  Laterality: Bilateral;   CARPAL TUNNEL RELEASE Bilateral    CESAREAN SECTION  1989   ESOPHAGOGASTRODUODENOSCOPY (EGD) WITH ESOPHAGEAL DILATION     HIP PINNING,CANNULATED Right 01/26/2017   Procedure: CANNULATED SCREWS/RIGHT HIP PINNING;  Surgeon: Mcarthur Rossetti, MD;  Location: [place];  Service: Orthopedics;  Laterality: Right;   INNER EAR SURGERY Right 1995   LUMBAR LAMINECTOMY Right 1999   [name]   MASTECTOMY Bilateral     There were no vitals filed for this visit.   Subjective Assessment - 09/25/21 1302     Subjective Vianney reports little progress 3 weeks (and 2 visits) after starting PT.    Pertinent History Myasthenia Gravis, neuropathy, previous R hip fracture, RA, previous laminectomy, B mastectomy, B Carpal tunnel release    Limitations Reading;Writing;Lifting;House hold activities    Patient Stated Goals Get rid of L elbow pain so she can play piano, hold  a book and return to Yoga and Piliates    Currently in Pain? Yes    Pain Score 6     Pain Location Elbow    Pain Orientation Left;Medial    Pain Descriptors / Indicators Aching;Constant;Sore    Pain Type Chronic pain    Pain Radiating Towards Hand pain (possibly RA)    Pain Onset More than a month ago    Pain Frequency Constant    Aggravating Factors  Any L UE function    Pain Relieving Factors Rest    Effect of Pain on Daily Activities Can't hold a book to read, play piano or fully participate in Pilates or Yoga    Multiple Pain Sites No                [facility] PT Assessment - 09/25/21 0001       AROM   Right Elbow Flexion 147    Right Elbow Extension -8    Left Elbow Flexion 143    Left Elbow Extension -9    Right  Wrist Extension 70 Degrees    Right Wrist Flexion 65 Degrees    Left Wrist Extension 60 Degrees    Left Wrist Flexion 60 Degrees      Strength   Overall Strength Deficits    Overall Strength Comments Grip L/R in pounds (34.5/53.0 pounds)                           [facility] Adult PT Treatment/Exercise - 09/25/21 0001       Exercises   Exercises Wrist;Elbow      Elbow Exercises   Elbow Flexion AROM;Left;10 reps;Standing;Limitations    Elbow Flexion Limitations 3 seconds    Elbow Extension AROM;Left;10 reps;Standing;Limitations    Elbow Extension Limitations 3 seconds    Other elbow exercises Wrist flexion and extension stretch (elbow straight) 4-5X 10-15 seconds each      Wrist Exercises   Wrist Flexion Limitations Attempted, substituted 10X 5 seconds isometrics    Wrist Extension Limitations Attempted , substituted 10X 5 seconds isometrics               STM with compression to Lt wrist extensors, biceps, and wrist flexors; DN to same muscle group with twitch responses in all groups.  Laureen Abrahams, PT, DPT 10/02/21 7:14 AM       PT Education - 09/25/21 1304     Education Details Reviewed and corrected HEP.  Changes isometrics in for drop eccentrics.    Person(s) Educated Patient    Methods Explanation;Demonstration;Tactile cues;Verbal cues;Handout    Comprehension Verbalized understanding;Tactile cues required;Returned demonstration;Need further instruction;Verbal cues required              PT Short Term Goals - 09/25/21 1305       PT SHORT TERM GOAL #1   Title Improve L grip strength to 45 pounds.    Baseline 39.9 pounds    Time 4    Period Weeks    Status On-going    Target Date 10/01/21      PT SHORT TERM GOAL #2   Title Improve L elbow AROM to 100% of the uninvolved R.    Baseline -9 (vs -8) extension and 142 flexion (vs 147)    Time 4    Period Weeks    Status On-going    Target Date 10/01/21      PT SHORT TERM GOAL  #3   Title  Improve L wrist AROM for flexion to 65 degrees and extension to 70 degrees.    Baseline Both 60 degrees    Time 4    Period Weeks    Status On-going    Target Date 10/01/21               PT Long Term Goals - 09/03/21 1633       PT LONG TERM GOAL #1   Title Improve FOTO to 64 (was 48).    Baseline 48    Time 10    Period Weeks    Status New    Target Date 11/12/21      PT LONG TERM GOAL #2   Title Improve L wrist flexion and extension strength to at least 15 pounds.    Baseline 7.5 and 8.9 pounds, respectively    Time 10    Period Weeks    Status New    Target Date 11/12/21      PT LONG TERM GOAL #3   Title Improve L grip strength to 90% or better compared to the uninvolved R.    Baseline 71%    Time 10    Period Weeks    Status New    Target Date 11/12/21      PT LONG TERM GOAL #4   Title Ellarie will be independent with her long-term HEP at [place].    Baseline Started today    Time 10    Period Weeks    Status New    Target Date 11/12/21                   Plan - 09/25/21 1306     Clinical Wagner reports little early progress with her physical therapy.  However, she was making several errors with her HEP.  Her HEP was corrected and updated with a review of how important frequent compliance and avoiding overuse are to meet LTGs.  She is guarded with her L elbow and wrist movement and will likely require a longer rehabilitation to meet long-term goals.    Personal Factors and Comorbidities Comorbidity 3+    Comorbidities Myasthenia Gravis, neuropathy, previous R hip fracture, RA, previous laminectomy, B mastectomy, B Carpal tunnel release    Examination-Activity Limitations Reach Overhead;Self Feeding;Dressing;Bend;Lift;Carry    Examination-Participation Restrictions Community Activity;Driving    Stability/Clinical Decision Making Stable/Uncomplicated    Rehab Potential Good    PT Frequency --   1-2X/week for 10 weeks    PT Duration Other (comment)   10 weeks   PT Treatment/Interventions ADLs/Self Care Home Management;Electrical Stimulation;Cryotherapy;Iontophoresis 79m/ml Dexamethasone;Ultrasound;Therapeutic activities;Neuromuscular re-education;Therapeutic exercise;Patient/family education;Manual techniques;Dry needling    PT Next Visit Plan Assess response to DN/manual, Re-try drop eccentrics, wrist flexors and extensors strength progressions as appropriate while avoiding overuse, neutral wrist elbow/wrist strengthening (isometrics, shoulder light theraband).    PT Home Exercise Plan Access Code: [email or web address]    Consulted and Agree with Plan of Care Patient             Patient will benefit from skilled therapeutic intervention in order to improve the following deficits and impairments:  Decreased endurance, Decreased range of motion, Decreased strength, Increased edema, Impaired perceived functional ability, Impaired UE functional use, Pain  Visit Diagnosis: Muscle weakness (generalized)  Pain in left elbow  Stiffness of left elbow, not elsewhere classified  Stiffness of left wrist, not elsewhere classified     Problem List Patient Active Problem List   Diagnosis Date Noted  Tremor of right hand 07/23/2021   Titubation 07/23/2021   Weakness of both lower extremities 01/20/2021   Gait instability 01/20/2021   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 ([place]) 09/20/2019   Peroneal neuropathy at knee, left 03/19/2019   Patellar contusion, left, sequela 02/09/2019   Rib pain on right side 11/24/2018   Acquired absence of both breasts 09/20/2018   Ruptured right breast implant 08/26/2018   History of reconstruction of both breasts 07/08/2018   Breast implant capsular contracture 07/08/2018   Breast pain 07/08/2018   Retrognathia 09/15/2017   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   Status post-operative repair of closed fracture of right hip 09/08/2017    Acute pain of right knee 05/24/2017   Hip fracture ([place]) 01/25/2017   Closed hip fracture, right, initial encounter ([place])    Preop examination    Fibromyalgia    Neuropathy    Corneal abrasion    Nausea    Fall    Seronegative myasthenia gravis ([place]) 07/19/2015   Acute exacerbation of chronic low back pain 07/19/2015   Leukocytosis 07/19/2015   Myasthenia gravis in remission ([place]) 10/23/2014   OSA on CPAP 10/23/2014   Hypersomnia, persistent 10/23/2014   Hypoxemia 08/14/2014   Myasthenia gravis with exacerbation ([place]) 08/14/2014   Myasthenia gravis with exacerbation, ocular ([place]) 12/08/2012   Right shoulder pain 08/02/2012   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   History of optic neuritis 06/27/2012   Rosacea 06/27/2012   S/P laminectomy 06/27/2012   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   S/P bilateral mastectomy 06/26/2012   BRCA negative 06/26/2012   GERD (gastroesophageal reflux disease) 06/26/2012   Menopause 06/26/2012   Ankle pain 06/02/2012    Farley Ly, PT, MPT 09/25/2021, 1:09 PM  [facility] Physical Therapy [address] [place], [place], [address] Phone: [phone]   Fax:  [phone]  Name: CHARLEE SQUIBB MRN: [id number] Date of Birth: [birth year]

## 2021-09-25 NOTE — Patient Instructions (Signed)
Access Code: [email or web address] URL: [email or web address] Date: 09/25/2021 Prepared by: Vista Mink  Exercises Wrist Flexion with Dumbbell - 3-5 x daily - 7 x weekly - 1-2 sets - 10-30 reps Wrist Extension with Dumbbell - 3-5 x daily - 7 x weekly - 1-2 sets - 10-30 reps Elbow AROM Blocked Extension and Flexion in Supination - 1 x daily - 7 x weekly - 1 sets - 10 reps - 5 seconds hold Seated Isometric Wrist Extension - 3-5 x daily - 7 x weekly - 1 sets - 10 reps Seated Isometric Wrist Flexion Supinated with Manual Resistance - 3-5 x daily - 7 x weekly - 1 sets - 10 reps Wrist Flexor Stretch in Pronation - 2-3 x daily - 7 x weekly - 1 sets - 4-5 reps - 10-15 seconds hold Wrist Extensor Stretch With Elbow Flexed: Progression From Elbow at Side - 2-3 x daily - 7 x weekly - 1 sets - 4-5 reps - 10-15 seconds hold

## 2021-09-26 ENCOUNTER — Other Ambulatory Visit (HOSPITAL_COMMUNITY): Payer: Self-pay

## 2021-09-26 MED ORDER — DOXYCYCLINE MONOHYDRATE 50 MG PO CAPS
50.0000 mg | ORAL_CAPSULE | Freq: Every day | ORAL | 3 refills | Status: DC
  Filled 2021-09-26: qty 90, 90d supply, fill #0

## 2021-09-26 MED FILL — Finasteride Tab 5 MG: ORAL | 30 days supply | Qty: 15 | Fill #5 | Status: AC

## 2021-09-29 ENCOUNTER — Other Ambulatory Visit (HOSPITAL_COMMUNITY): Payer: Self-pay

## 2021-09-30 ENCOUNTER — Other Ambulatory Visit: Payer: Self-pay | Admitting: Neurology

## 2021-09-30 ENCOUNTER — Other Ambulatory Visit (HOSPITAL_COMMUNITY): Payer: Self-pay

## 2021-10-01 ENCOUNTER — Other Ambulatory Visit (HOSPITAL_COMMUNITY): Payer: Self-pay

## 2021-10-01 DIAGNOSIS — M9906 Segmental and somatic dysfunction of lower extremity: Secondary | ICD-10-CM | POA: Diagnosis not present

## 2021-10-01 DIAGNOSIS — M79671 Pain in right foot: Secondary | ICD-10-CM | POA: Diagnosis not present

## 2021-10-01 DIAGNOSIS — R209 Unspecified disturbances of skin sensation: Secondary | ICD-10-CM | POA: Diagnosis not present

## 2021-10-01 DIAGNOSIS — M9901 Segmental and somatic dysfunction of cervical region: Secondary | ICD-10-CM | POA: Diagnosis not present

## 2021-10-01 DIAGNOSIS — M79672 Pain in left foot: Secondary | ICD-10-CM | POA: Diagnosis not present

## 2021-10-01 DIAGNOSIS — M542 Cervicalgia: Secondary | ICD-10-CM | POA: Diagnosis not present

## 2021-10-01 MED ORDER — GABAPENTIN 300 MG PO CAPS
600.0000 mg | ORAL_CAPSULE | Freq: Two times a day (BID) | ORAL | 2 refills | Status: DC
  Filled 2021-10-01: qty 360, 90d supply, fill #0

## 2021-10-02 ENCOUNTER — Ambulatory Visit (INDEPENDENT_AMBULATORY_CARE_PROVIDER_SITE_OTHER): Payer: Medicare Other | Admitting: Rehabilitative and Restorative Service Providers"

## 2021-10-02 ENCOUNTER — Other Ambulatory Visit: Payer: Self-pay

## 2021-10-02 ENCOUNTER — Encounter: Payer: Self-pay | Admitting: Rehabilitative and Restorative Service Providers"

## 2021-10-02 DIAGNOSIS — M6281 Muscle weakness (generalized): Secondary | ICD-10-CM

## 2021-10-02 DIAGNOSIS — M25632 Stiffness of left wrist, not elsewhere classified: Secondary | ICD-10-CM | POA: Diagnosis not present

## 2021-10-02 DIAGNOSIS — M25622 Stiffness of left elbow, not elsewhere classified: Secondary | ICD-10-CM

## 2021-10-02 DIAGNOSIS — M25522 Pain in left elbow: Secondary | ICD-10-CM

## 2021-10-02 NOTE — Therapy (Signed)
[facility] Physical Therapy [address] [place], [place], [address] Phone: [phone]   Fax:  [phone]  Physical Therapy Treatment  Patient Details  Name: Felicia Cain MRN: [id number] Date of Birth: Jun 28, 1956 Referring Provider (PT): Sherilyn Cooter MD   Encounter Date: 10/02/2021   PT End of Session - 10/02/21 1750     Visit Number 4    Number of Visits 10    Date for PT Re-Evaluation 11/12/21    Authorization Type Medicare    Progress Note Due on Visit 10    PT Start Time 1102    PT Stop Time 1147    PT Time Calculation (min) 45 min    Activity Tolerance Patient limited by pain    Behavior During Therapy [facility] for tasks assessed/performed             Past Medical History:  Diagnosis Date   Anxiety    Arthritis    "back" (01/25/2017)   Brachial neuritis    neuropathy   Chronic lower back pain    DCIS (ductal carcinoma in situ)    "left side?"   Esophageal stricture    stricture with dysphasia   Fibromyalgia    GERD (gastroesophageal reflux disease)    H/O Doppler ultrasound 2006   see scanned study   H/O echocardiogram 2004, 2013   Hepatitis A 1961   [place]"   History of cardiac monitoring 2006   History of nuclear stress test 2004   see scanned study   Migraine    "in the past; cycle related" (01/25/2017)   Myasthenia gravis in crisis ([place]) 04/2014   respiratory crisis   NAION (non-arteritic anterior ischemic optic neuropathy), right    Near syncope    Neuromuscular disorder (HCC)    Neuropathy    Optic neuritis    ischaemic optic neuritis, non arteric   Optic neuropathy    PONV (postoperative nausea and vomiting)    Posterior optic neuritis    Ptosis of eyelid    Sleep apnea    "I wear Moses device; I don't wear CPAP" (01/25/2017)   Spinal stenosis     Past Surgical History:  Procedure Laterality Date   AUGMENTATION MAMMAPLASTY     2000, after breast cancer surgery,redone 2010   BACK SURGERY      BREAST BIOPSY     BREAST CAPSULECTOMY WITH IMPLANT EXCHANGE Bilateral 09/28/2018   Procedure: REMOVAL OF BILATERAL BREAST IMPLANTS WITH CAPSULECTOMIES AND REPLACEMENTS OF IMPLANTS;  Surgeon: Wallace Going, DO;  Location: [place];  Service: Plastics;  Laterality: Bilateral;   CARPAL TUNNEL RELEASE Bilateral    CESAREAN SECTION  1989   ESOPHAGOGASTRODUODENOSCOPY (EGD) WITH ESOPHAGEAL DILATION     HIP PINNING,CANNULATED Right 01/26/2017   Procedure: CANNULATED SCREWS/RIGHT HIP PINNING;  Surgeon: Mcarthur Rossetti, MD;  Location: [place];  Service: Orthopedics;  Laterality: Right;   INNER EAR SURGERY Right 1995   LUMBAR LAMINECTOMY Right 1999   [name]   MASTECTOMY Bilateral     There were no vitals filed for this visit.   Subjective Assessment - 10/02/21 1751     Subjective Felicia Cain still reports she feels as if she is not making any progress with her early PT (today being the 4th visit).    Pertinent History Myasthenia Gravis, neuropathy, previous R hip fracture, RA, previous laminectomy, B mastectomy, B Carpal tunnel release    Limitations Reading;Writing;Lifting;House hold activities    Patient Stated Goals Get rid  of L elbow pain so she can play piano, hold a book and return to Yoga and Piliates    Currently in Pain? Yes    Pain Score 7     Pain Location Elbow    Pain Orientation Left;Medial    Pain Descriptors / Indicators Aching;Sore;Constant    Pain Type Chronic pain    Pain Radiating Towards Hand pain (RA?)    Pain Onset More than a month ago    Pain Frequency Constant    Aggravating Factors  Any L UE function    Pain Relieving Factors Rest    Effect of Pain on Daily Activities Can't hold a book, play the piano or participate in Yoga or Pilates    Multiple Pain Sites No                               [facility] Adult PT Treatment/Exercise - 10/02/21 0001       Exercises   Exercises Wrist;Elbow      Elbow Exercises   Elbow  Flexion AROM;Left;10 reps;Standing;Limitations    Elbow Flexion Limitations 3 seconds    Elbow Extension AROM;Left;10 reps;Standing;Limitations    Elbow Extension Limitations 3 seconds    Other elbow exercises Wrist flexion and extension stretch (elbow straight) 4-5X 10-15 seconds each      Wrist Exercises   Wrist Flexion Limitations Wrist flexion isometrics palms facing each other 2 sets of 10 for 5 seconds    Wrist Extension Limitations Palm down drop eccentrics 2 sets of 10 (1 set no weight, 1 set 0.5 lbs)      General Exercises - Upper Extremity   Elbow Extension Limitations Scapular retraction (SBP) 10X 5 seconds and supine arm raises 20X 3 seconds                     PT Education - 10/02/21 1753     Education Details Reviewed the importance of avoiding overuse, reviewed HEP prescription to help avoid overuse and modified HEP as needed to help with elbow pain.    Person(s) Educated Patient    Methods Explanation;Demonstration;Tactile cues;Verbal cues;Handout    Comprehension Tactile cues required;Verbalized understanding;Returned demonstration;Need further instruction;Other (comment);Verbal cues required              PT Short Term Goals - 09/25/21 1305       PT SHORT TERM GOAL #1   Title Improve L grip strength to 45 pounds.    Baseline 39.9 pounds    Time 4    Period Weeks    Status On-going    Target Date 10/01/21      PT SHORT TERM GOAL #2   Title Improve L elbow AROM to 100% of the uninvolved R.    Baseline -9 (vs -8) extension and 142 flexion (vs 147)    Time 4    Period Weeks    Status On-going    Target Date 10/01/21      PT SHORT TERM GOAL #3   Title Improve L wrist AROM for flexion to 65 degrees and extension to 70 degrees.    Baseline Both 60 degrees    Time 4    Period Weeks    Status On-going    Target Date 10/01/21               PT Long Term Goals - 09/03/21 1633       PT LONG TERM GOAL #  1   Title Improve FOTO to 64 (was  48).    Baseline 48    Time 10    Period Weeks    Status New    Target Date 11/12/21      PT LONG TERM GOAL #2   Title Improve L wrist flexion and extension strength to at least 15 pounds.    Baseline 7.5 and 8.9 pounds, respectively    Time 10    Period Weeks    Status New    Target Date 11/12/21      PT LONG TERM GOAL #3   Title Improve L grip strength to 90% or better compared to the uninvolved R.    Baseline 71%    Time 10    Period Weeks    Status New    Target Date 11/12/21      PT LONG TERM GOAL #4   Title Felicia Cain will be independent with her long-term HEP at [place].    Baseline Started today    Time 10    Period Weeks    Status New    Target Date 11/12/21                   Plan - 10/02/21 1754     Clinical Impression Statement Felicia Cain is having a tough time with her medial elbow.  She appears to be having a difficult time avoiding overuse at home.  We modified her HEP to try to address the exercise part of possible overuse and discussed various things she might want to discuss with Dr. Levert Feinstein at her appointment in February.    Personal Factors and Comorbidities Comorbidity 3+    Comorbidities Myasthenia Gravis, neuropathy, previous R hip fracture, RA, previous laminectomy, B mastectomy, B Carpal tunnel release    Examination-Activity Limitations Reach Overhead;Self Feeding;Dressing;Bend;Lift;Carry    Examination-Participation Restrictions Community Activity;Driving    Stability/Clinical Decision Making Stable/Uncomplicated    Rehab Potential Good    PT Frequency --   1-2X/week for 10 weeks   PT Duration Other (comment)   10 weeks   PT Treatment/Interventions ADLs/Self Care Home Management;Electrical Stimulation;Cryotherapy;Iontophoresis 85m/ml Dexamethasone;Ultrasound;Therapeutic activities;Neuromuscular re-education;Therapeutic exercise;Patient/family education;Manual techniques;Dry needling    PT Next Visit Plan Slow appropriate progression of overall R UE  strength while avoiding overuse.    PT Home Exercise Plan Access Code: [email or web address]    Consulted and Agree with Plan of Care Patient             Patient will benefit from skilled therapeutic intervention in order to improve the following deficits and impairments:  Decreased endurance, Decreased range of motion, Decreased strength, Increased edema, Impaired perceived functional ability, Impaired UE functional use, Pain  Visit Diagnosis: Muscle weakness (generalized)  Pain in left elbow  Stiffness of left elbow, not elsewhere classified  Stiffness of left wrist, not elsewhere classified     Problem List Patient Active Problem List   Diagnosis Date Noted   Tremor of right hand 07/23/2021   Titubation 07/23/2021   Weakness of both lower extremities 01/20/2021   Gait instability 01/20/2021   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 ([place] 09/20/2019   Peroneal neuropathy at knee, left 03/19/2019   Patellar contusion, left, sequela 02/09/2019   Rib pain on right side 11/24/2018   Acquired absence of both breasts 09/20/2018   Ruptured right breast implant 08/26/2018   History of reconstruction of both breasts 07/08/2018   Breast implant capsular contracture 07/08/2018   Breast pain 07/08/2018  Retrognathia 09/15/2017   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   Status post-operative repair of closed fracture of right hip 09/08/2017   Acute pain of right knee 05/24/2017   Hip fracture ([place]) 01/25/2017   Closed hip fracture, right, initial encounter ([place])    Preop examination    Fibromyalgia    Neuropathy    Corneal abrasion    Nausea    Fall    Seronegative myasthenia gravis ([place]) 07/19/2015   Acute exacerbation of chronic low back pain 07/19/2015   Leukocytosis 07/19/2015   Myasthenia gravis in remission ([place]) 10/23/2014   OSA on CPAP 10/23/2014   Hypersomnia, persistent 10/23/2014   Hypoxemia 08/14/2014   Myasthenia gravis with  exacerbation ([place]) 08/14/2014   Myasthenia gravis with exacerbation, ocular ([place]) 12/08/2012   Right shoulder pain 08/02/2012   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   History of optic neuritis 06/27/2012   Rosacea 06/27/2012   S/P laminectomy 06/27/2012   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   S/P bilateral mastectomy 06/26/2012   BRCA negative 06/26/2012   GERD (gastroesophageal reflux disease) 06/26/2012   Menopause 06/26/2012   Ankle pain 06/02/2012    Farley Ly, PT, MPT 10/02/2021, 5:57 PM  [facility] Physical Therapy [address] [place], [place], [address] Phone: [phone]   Fax:  [phone]  Name: Felicia Cain MRN: [id number] Date of Birth: Oct 14, 1955

## 2021-10-02 NOTE — Patient Instructions (Signed)
Access Code: [email or web address] URL: [email or web address] Date: 10/02/2021 Prepared by: Vista Mink  Exercises Wrist Flexion with Dumbbell - 3-5 x daily - 1 x weekly - 1-2 sets - 10-30 reps Wrist Extension with Dumbbell - 3-5 x daily - 7 x weekly - 1 sets - 10-30 reps Elbow AROM Blocked Extension and Flexion in Supination - 1 x daily - 7 x weekly - 1 sets - 5 reps - 5 seconds hold Seated Isometric Wrist Extension - 3-5 x daily - 1 x weekly - 1 sets - 10 reps Seated Isometric Wrist Flexion Supinated with Manual Resistance - 3-5 x daily - 7 x weekly - 1 sets - 10 reps Wrist Flexor Stretch in Pronation - 1 x daily - 7 x weekly - 1 sets - 4-5 reps - 10-15 seconds hold Wrist Extensor Stretch With Elbow Flexed: Progression From Elbow at Side - 1 x daily - 7 x weekly - 1 sets - 4-5 reps - 10-15 seconds hold

## 2021-10-09 ENCOUNTER — Encounter: Payer: Self-pay | Admitting: Rehabilitative and Restorative Service Providers"

## 2021-10-09 ENCOUNTER — Other Ambulatory Visit: Payer: Self-pay

## 2021-10-09 ENCOUNTER — Ambulatory Visit (INDEPENDENT_AMBULATORY_CARE_PROVIDER_SITE_OTHER): Payer: Medicare Other | Admitting: Rehabilitative and Restorative Service Providers"

## 2021-10-09 ENCOUNTER — Other Ambulatory Visit (HOSPITAL_COMMUNITY): Payer: Self-pay

## 2021-10-09 DIAGNOSIS — M25632 Stiffness of left wrist, not elsewhere classified: Secondary | ICD-10-CM | POA: Diagnosis not present

## 2021-10-09 DIAGNOSIS — M25622 Stiffness of left elbow, not elsewhere classified: Secondary | ICD-10-CM | POA: Diagnosis not present

## 2021-10-09 DIAGNOSIS — M6281 Muscle weakness (generalized): Secondary | ICD-10-CM

## 2021-10-09 DIAGNOSIS — M25522 Pain in left elbow: Secondary | ICD-10-CM

## 2021-10-09 NOTE — Therapy (Addendum)
[facility] [address] [place], [place], [address] Phone: [phone]   Fax:  [phone]  Physical Therapy Treatment/Discharge Note  Patient Details  Name: Felicia [facility] MRN: [id number] Date of Birth: October 08, 1955 Referring Provider (PT): Sherilyn Cooter MD  PHYSICAL THERAPY DISCHARGE SUMMARY  Visits from Start of Care: 5  Current functional level related to goals / functional outcomes: See note   Remaining deficits: See note   Education / Equipment: HEP   Patient agrees to discharge. Patient goals were  on going . Patient is being discharged due to not returning since the last visit.   Encounter Date: 10/09/2021   PT End of Session - 10/09/21 1804     Visit Number 5    Number of Visits 10    Date for PT Re-Evaluation 11/12/21    Authorization Type Medicare    Progress Note Due on Visit 10    PT Start Time 1100    PT Stop Time 1145    PT Time Calculation (min) 45 min    Activity Tolerance Patient limited by pain    Behavior During Therapy [facility] for tasks assessed/performed             Past Medical History:  Diagnosis Date   Anxiety    Arthritis    "back" (01/25/2017)   Brachial neuritis    neuropathy   Chronic lower back pain    DCIS (ductal carcinoma in situ)    "left side?"   Esophageal stricture    stricture with dysphasia   Fibromyalgia    GERD (gastroesophageal reflux disease)    H/O Doppler ultrasound 2006   see scanned study   H/O echocardiogram 2004, 2013   Hepatitis A 1961   [place]   History of cardiac monitoring 2006   History of nuclear stress test 2004   see scanned study   Migraine    "in the past; cycle related" (01/25/2017)   Myasthenia gravis in crisis ([place]) 04/2014   respiratory crisis   NAION (non-arteritic anterior ischemic optic neuropathy), right    Near syncope    Neuromuscular disorder (HCC)    Neuropathy    Optic neuritis    ischaemic optic neuritis, non arteric    Optic neuropathy    PONV (postoperative nausea and vomiting)    Posterior optic neuritis    Ptosis of eyelid    Sleep apnea    "I wear Moses device; I don't wear CPAP" (01/25/2017)   Spinal stenosis     Past Surgical History:  Procedure Laterality Date   AUGMENTATION MAMMAPLASTY     2000, after breast cancer surgery,redone 2010   BACK SURGERY     BREAST BIOPSY     BREAST CAPSULECTOMY WITH IMPLANT EXCHANGE Bilateral 09/28/2018   Procedure: REMOVAL OF BILATERAL BREAST IMPLANTS WITH CAPSULECTOMIES AND REPLACEMENTS OF IMPLANTS;  Surgeon: Wallace Going, DO;  Location: [facility];  Service: Plastics;  Laterality: Bilateral;   CARPAL TUNNEL RELEASE Bilateral    CESAREAN SECTION  1989   ESOPHAGOGASTRODUODENOSCOPY (EGD) WITH ESOPHAGEAL DILATION     HIP PINNING,CANNULATED Right 01/26/2017   Procedure: CANNULATED SCREWS/RIGHT HIP PINNING;  Surgeon: Mcarthur Rossetti, MD;  Location: [facility];  Service: Orthopedics;  Laterality: Right;   INNER EAR SURGERY Right 1995   LUMBAR LAMINECTOMY Right 1999   [name]   MASTECTOMY Bilateral     There were no vitals filed for this visit.   Subjective Assessment - 10/09/21 1143  Subjective [name] feels a little better, a little stronger as compared to before PT.  She still has poor endurance with any L UE use.    Pertinent History Myasthenia Gravis, neuropathy, previous R hip fracture, RA, previous laminectomy, B mastectomy, B Carpal tunnel release    Limitations Reading;Writing;Lifting;House hold activities    Patient Stated Goals Get rid of L elbow pain so she can play piano, hold a book and return to Yoga and Piliates    Currently in Pain? Yes    Pain Score 3     Pain Location Elbow    Pain Orientation Left;Medial    Pain Descriptors / Indicators Aching;Sore;Constant    Pain Type Chronic pain    Pain Radiating Towards Hand pain (RA?)    Pain Onset More than a month ago    Pain Frequency Constant    Aggravating Factors   Just about any L UE function    Pain Relieving Factors Rest and avoiding overuse    Effect of Pain on Daily Activities Can't hold a book, play piano or participate fully in Yoga and Pilates    Multiple Pain Sites No                [facility] PT Assessment - 10/09/21 0001       Observation/Other Assessments   Focus on Therapeutic Outcomes (FOTO)  43 (Goal 64, was 48)      ROM / Strength   AROM / PROM / Strength AROM;Strength      AROM   Overall AROM  Deficits    AROM Assessment Site Wrist;Elbow    Right Elbow Flexion 147    Right Elbow Extension -8    Left Elbow Flexion 143    Left Elbow Extension -9    Right Wrist Extension 70 Degrees    Right Wrist Flexion 70 Degrees    Left Wrist Extension 65 Degrees    Left Wrist Flexion 75 Degrees      Strength   Overall Strength Comments Grip L/R in pounds (36.7/53.5 pounds)    Right Wrist Flexion --   11.3 pounds   Right Wrist Extension --   13.6 pounds   Left Wrist Flexion --   7.5 pounds   Left Wrist Extension --   8.9 pounds                          [facility] Adult PT Treatment/Exercise - 10/09/21 0001       Exercises   Exercises Wrist;Elbow      Elbow Exercises   Elbow Flexion AROM;Left;5 reps;Standing;Limitations    Elbow Flexion Limitations 3 seconds    Elbow Extension AROM;Left;5 reps;Standing;Limitations    Elbow Extension Limitations 3 seconds    Other elbow exercises Wrist flexion and extension stretch (elbow straight) 4-5X 10-15 seconds each      Wrist Exercises   Wrist Flexion Limitations Wrist flexion isometrics palms face each other (or on table) 10X 5 seconds    Wrist Extension Limitations Palm down and palm up 10X 0.5 pounds slow eccentrics      General Exercises - Upper Extremity   Elbow Extension Limitations scapular retraction 10X 5 seconds and supine arm raises 20X 3 seconds                     PT Education - 10/09/21 1806     Education Details Reviewed exam findings, the  importance of avoiding overuse and recommendations for continued  care.    Person(s) Educated Patient    Methods Explanation;Demonstration;Tactile cues;Verbal cues    Comprehension Verbal cues required;Returned demonstration;Verbalized understanding;Tactile cues required              PT Short Term Goals - 10/09/21 1807       PT SHORT TERM GOAL #1   Title Improve L grip strength to 45 pounds.    Baseline 36.7 pounds    Time 4    Period Weeks    Status Revised    Target Date 11/06/21      PT SHORT TERM GOAL #2   Title Improve L elbow AROM to 100% of the uninvolved R.    Baseline -9 (vs -8) extension and 142 flexion (vs 147)    Time 4    Period Weeks    Status Revised    Target Date 11/06/21      PT SHORT TERM GOAL #3   Title Improve L wrist AROM for flexion to 65 degrees and extension to 70 degrees.    Baseline Flexion met, extension 65    Time 4    Period Weeks    Status Revised    Target Date 11/06/21               PT Long Term Goals - 10/09/21 1808       PT LONG TERM GOAL #1   Title Improve FOTO to 64 (was 48).    Baseline 43 (was 48 at evaluation)    Time 12    Period Weeks    Status Revised    Target Date 01/01/22      PT LONG TERM GOAL #2   Title Improve L wrist flexion and extension strength to at least 15 pounds.    Baseline 7.5 and 8.9 pounds, respectively    Time 12    Period Weeks    Status Revised    Target Date 01/01/22      PT LONG TERM GOAL #3   Title Improve L grip strength to 90% or better compared to the uninvolved R.    Baseline Not met (see objective)    Time 12    Period Weeks    Status Revised    Target Date 01/01/22      PT LONG TERM GOAL #4   Title Marlise will be independent with her long-term HEP at [place].    Baseline Will need modification and progression    Time 12    Period Weeks    Status Revised    Target Date 01/01/22                   Plan - 10/09/21 1810     Clinical Impression Statement Lenda Kelp  notes "some" progress with her medial L elbow, although progress is slower than anticipated.  Overuse and a low comfort level with most activity is slowing progress.  We discussed decreasing PT frequency to once every 2-4 weeks to assess progress with avoiding overuse and strength progress.  She is following-up with Dr. Tempie Donning next week to see what his recommendations are.  Her low comfort level with almost ny activity suggests this will be a long rehabilitation.    Personal Factors and Comorbidities Comorbidity 3+    Comorbidities Myasthenia Gravis, neuropathy, previous R hip fracture, RA, previous laminectomy, B mastectomy, B Carpal tunnel release    Examination-Activity Limitations Reach Overhead;Self Feeding;Dressing;Bend;Lift;Carry    Examination-Participation Restrictions Community Activity;Driving    Stability/Clinical Decision Making Stable/Uncomplicated  Rehab Potential Good    PT Frequency --   every 2-4 weeks 1 visit   PT Duration 12 weeks    PT Treatment/Interventions ADLs/Self Care Home Management;Electrical Stimulation;Cryotherapy;Iontophoresis 48m/ml Dexamethasone;Ultrasound;Therapeutic activities;Neuromuscular re-education;Therapeutic exercise;Patient/family education;Manual techniques;Dry needling    PT Next Visit Plan Slow appropriate progression of overall R UE strength while avoiding overuse.    PT Home Exercise Plan Access Code: [email or web address]    Consulted and Agree with Plan of Care Patient             Patient will benefit from skilled therapeutic intervention in order to improve the following deficits and impairments:  Decreased endurance, Decreased range of motion, Decreased strength, Increased edema, Impaired perceived functional ability, Impaired UE functional use, Pain  Visit Diagnosis: Muscle weakness (generalized)  Pain in left elbow  Stiffness of left elbow, not elsewhere classified  Stiffness of left wrist, not elsewhere classified     Problem  List Patient Active Problem List   Diagnosis Date Noted   Tremor of right hand 07/23/2021   Titubation 07/23/2021   Weakness of both lower extremities 01/20/2021   Gait instability 01/20/2021   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 ([place] 09/20/2019   Peroneal neuropathy at knee, left 03/19/2019   Patellar contusion, left, sequela 02/09/2019   Rib pain on right side 11/24/2018   Acquired absence of both breasts 09/20/2018   Ruptured right breast implant 08/26/2018   History of reconstruction of both breasts 07/08/2018   Breast implant capsular contracture 07/08/2018   Breast pain 07/08/2018   Retrognathia 09/15/2017   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   Status post-operative repair of closed fracture of right hip 09/08/2017   Acute pain of right knee 05/24/2017   Hip fracture ([place] 01/25/2017   Closed hip fracture, right, initial encounter ([place]    Preop examination    Fibromyalgia    Neuropathy    Corneal abrasion    Nausea    Fall    Seronegative [facility] 07/19/2015   Acute exacerbation of chronic low back pain 07/19/2015   Leukocytosis 07/19/2015   Myasthenia gravis in remission ([place] 10/23/2014   OSA on CPAP 10/23/2014   Hypersomnia, persistent 10/23/2014   Hypoxemia 08/14/2014   Myasthenia gravis with exacerbation ([place] 08/14/2014   Myasthenia gravis with exacerbation, ocular ([place] 12/08/2012   Right shoulder pain 08/02/2012   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   History of optic neuritis 06/27/2012   Rosacea 06/27/2012   S/P laminectomy 06/27/2012   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   S/P bilateral mastectomy 06/26/2012   BRCA negative 06/26/2012   GERD (gastroesophageal reflux disease) 06/26/2012   Menopause 06/26/2012   Ankle pain 06/02/2012    RFarley Ly PT, MPT 10/09/2021, 6:15 PM  [facility] [address] [address]Phone:  [phone]  Fax:  [phone] Name: Felicia COBLEMRN: [id number] of Birth: 122-Feb-1957

## 2021-10-09 NOTE — Patient Instructions (Signed)
Access Code: [email or web address] URL: [email or web address] Date: 10/09/2021 Prepared by: Vista Mink  Exercises Wrist Flexion with Dumbbell - 3-5 x daily - 1 x weekly - 1-2 sets - 10-30 reps Wrist Extension with Dumbbell - 3-5 x daily - 7 x weekly - 1 sets - 10-30 reps Elbow AROM Blocked Extension and Flexion in Supination - 1 x daily - 7 x weekly - 1 sets - 5 reps - 5 seconds hold Seated Isometric Wrist Extension - 3-5 x daily - 1 x weekly - 1 sets - 10 reps Seated Isometric Wrist Flexion Supinated with Manual Resistance - 3-5 x daily - 7 x weekly - 1 sets - 10 reps Wrist Flexor Stretch in Pronation - 1 x daily - 7 x weekly - 1 sets - 4-5 reps - 10-15 seconds hold Wrist Extensor Stretch With Elbow Flexed: Progression From Elbow at Side - 1 x daily - 7 x weekly - 1 sets - 4-5 reps - 10-15 seconds hold

## 2021-10-15 DIAGNOSIS — M0609 Rheumatoid arthritis without rheumatoid factor, multiple sites: Secondary | ICD-10-CM | POA: Diagnosis not present

## 2021-10-15 DIAGNOSIS — Z79899 Other long term (current) drug therapy: Secondary | ICD-10-CM | POA: Diagnosis not present

## 2021-10-15 DIAGNOSIS — R251 Tremor, unspecified: Secondary | ICD-10-CM | POA: Diagnosis not present

## 2021-10-15 DIAGNOSIS — Z0000 Encounter for general adult medical examination without abnormal findings: Secondary | ICD-10-CM | POA: Diagnosis not present

## 2021-10-15 DIAGNOSIS — Z1389 Encounter for screening for other disorder: Secondary | ICD-10-CM | POA: Diagnosis not present

## 2021-10-15 DIAGNOSIS — I7300 Raynaud's syndrome without gangrene: Secondary | ICD-10-CM | POA: Diagnosis not present

## 2021-10-15 DIAGNOSIS — K219 Gastro-esophageal reflux disease without esophagitis: Secondary | ICD-10-CM | POA: Diagnosis not present

## 2021-10-15 DIAGNOSIS — R269 Unspecified abnormalities of gait and mobility: Secondary | ICD-10-CM | POA: Diagnosis not present

## 2021-10-15 DIAGNOSIS — G629 Polyneuropathy, unspecified: Secondary | ICD-10-CM | POA: Diagnosis not present

## 2021-10-16 ENCOUNTER — Encounter: Payer: PRIVATE HEALTH INSURANCE | Admitting: Rehabilitative and Restorative Service Providers"

## 2021-10-16 ENCOUNTER — Ambulatory Visit: Payer: PRIVATE HEALTH INSURANCE | Admitting: Orthopedic Surgery

## 2021-10-16 ENCOUNTER — Other Ambulatory Visit (HOSPITAL_COMMUNITY): Payer: Self-pay

## 2021-10-16 DIAGNOSIS — L661 Lichen planopilaris: Secondary | ICD-10-CM | POA: Diagnosis not present

## 2021-10-16 DIAGNOSIS — L719 Rosacea, unspecified: Secondary | ICD-10-CM | POA: Diagnosis not present

## 2021-10-16 MED ORDER — DOXYCYCLINE HYCLATE 20 MG PO TABS
ORAL_TABLET | ORAL | 3 refills | Status: DC
  Filled 2021-10-16: qty 180, 90d supply, fill #0

## 2021-10-16 MED ORDER — FINASTERIDE 5 MG PO TABS
ORAL_TABLET | ORAL | 3 refills | Status: AC
  Filled 2021-10-16 – 2021-10-23 (×2): qty 15, 30d supply, fill #0
  Filled 2021-11-22: qty 15, 30d supply, fill #1
  Filled 2021-12-23: qty 15, 30d supply, fill #2

## 2021-10-17 ENCOUNTER — Other Ambulatory Visit (HOSPITAL_COMMUNITY): Payer: Self-pay

## 2021-10-20 ENCOUNTER — Other Ambulatory Visit (HOSPITAL_COMMUNITY): Payer: Self-pay

## 2021-10-21 ENCOUNTER — Other Ambulatory Visit (HOSPITAL_COMMUNITY): Payer: Self-pay

## 2021-10-21 ENCOUNTER — Encounter: Payer: Self-pay | Admitting: Orthopedic Surgery

## 2021-10-21 ENCOUNTER — Ambulatory Visit (INDEPENDENT_AMBULATORY_CARE_PROVIDER_SITE_OTHER): Payer: Medicare Other | Admitting: Orthopedic Surgery

## 2021-10-21 ENCOUNTER — Other Ambulatory Visit: Payer: Self-pay

## 2021-10-21 DIAGNOSIS — M25521 Pain in right elbow: Secondary | ICD-10-CM

## 2021-10-21 DIAGNOSIS — M25522 Pain in left elbow: Secondary | ICD-10-CM | POA: Insufficient documentation

## 2021-10-21 NOTE — Progress Notes (Signed)
Office Visit Note   Patient: Felicia [facility]           Date of Birth: [birth year]           MRN: [id number] Visit Date: 10/21/2021              Requested by: Lajean Manes, MD [address] [place],  [place] [address] PCP: Lajean Manes, MD   Assessment & Plan: Visit Diagnoses:  1. Pain in left elbow   2. Pain in right elbow     Plan: We discussed her continued bilateral elbow pain that is persisted despite occupational therapy.  She was recently diagnosed with psoriatic arthritis based on discussions between her rheumatologist and dermatologist.  She has been started on a new psoriatic medication which has caused resolution of her severe bilateral hand pain.  She thinks her elbow problems are related.  She wants to continue to monitor her symptoms for now and see if they improve with her new medication.  Follow-Up Instructions: No follow-ups on file.   Orders:  No orders of the defined types were placed in this encounter.  No orders of the defined types were placed in this encounter.     Procedures: No procedures performed   Clinical Data: No additional findings.   Subjective: Chief Complaint  Patient presents with   Left Elbow - Follow-up    This is a 66 year old female who presents for follow-up of left elbow and bilateral wrist pain.  She was seen back in December at which time she had what sounds like lateral epicondylitis with pain in both of her hands.  She has been doing occupational therapy and notes some symptom improvement but now continued symptoms involving both elbows.  Her elbow pain seems to be worse medially but is also present laterally.  She has been started on a new medication for psoriatic arthritis which has caused resolution of her hand pain and stiffness.   Review of Systems   Objective: Vital Signs: BP 114/74 (BP Location: Right Arm, Patient Position: Sitting, Cuff Size: Normal)    Pulse 86    SpO2 96%   Physical  Exam  Right Elbow Exam   Tenderness  Right elbow tenderness location: Diffuse elbow tenderness.   Range of Motion  The patient has normal right elbow ROM.  Other  Erythema: absent Sensation: normal Pulse: present   Left Elbow Exam   Tenderness  Left elbow tenderness location: Diffuse elbow tenderness.   Range of Motion  The patient has normal left elbow ROM.  Other  Erythema: absent Sensation: normal Pulse: present     Specialty Comments:  No specialty comments available.  Imaging: No results found.   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 ([place]) 09/20/2019   Peroneal neuropathy at knee, left 03/19/2019   Patellar contusion, left, sequela 02/09/2019   Rib pain on right side 11/24/2018   Acquired absence of both breasts 09/20/2018   Ruptured right breast implant 08/26/2018   History of reconstruction of both breasts 07/08/2018   Breast implant capsular contracture 07/08/2018   Breast pain 07/08/2018   Retrognathia 09/15/2017   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  post-operative repair of closed fracture of right hip 09/08/2017  ° Acute pain of right knee 05/24/2017  ° Hip fracture (HCC) 01/25/2017  ° Closed hip fracture, right, initial encounter (HCC)   ° Preop examination   ° Fibromyalgia   ° Neuropathy   ° Corneal abrasion   ° Nausea   ° Fall   ° Seronegative myasthenia gravis (HCC) 07/19/2015  ° Acute exacerbation of chronic low back pain 07/19/2015  ° Leukocytosis 07/19/2015  ° Myasthenia gravis in remission (HCC) 10/23/2014  ° OSA on CPAP 10/23/2014  ° Hypersomnia, persistent 10/23/2014  ° Hypoxemia 08/14/2014  ° Myasthenia gravis with  exacerbation (HCC) 08/14/2014  ° Myasthenia gravis with exacerbation, ocular (HCC) 12/08/2012  ° Right shoulder pain 08/02/2012  ° 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  ° History of optic neuritis 06/27/2012  ° Rosacea 06/27/2012  ° S/P laminectomy 06/27/2012  ° 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  ° S/P bilateral mastectomy 06/26/2012  ° BRCA negative 06/26/2012  ° GERD (gastroesophageal reflux disease) 06/26/2012  ° Menopause 06/26/2012  ° Ankle pain 06/02/2012  ° °Past Medical History:  °Diagnosis Date  ° Anxiety   ° Arthritis   ° "back" (01/25/2017)  ° Brachial neuritis   ° neuropathy  ° Chronic lower back pain   ° DCIS (ductal carcinoma in situ)   ° "left side?"  ° Esophageal stricture   ° stricture with dysphasia  ° Fibromyalgia   ° GERD (gastroesophageal reflux disease)   ° H/O Doppler ultrasound 2006  ° see scanned study  ° H/O echocardiogram 2004, 2013  ° Hepatitis A 1961  ° [place]"  ° History of cardiac monitoring 2006  ° History of nuclear stress test 2004  ° see scanned study  ° Migraine   ° "in the past; cycle related" (01/25/2017)  ° Myasthenia gravis in crisis (HCC) 04/2014  ° respiratory crisis  ° NAION (non-arteritic anterior ischemic optic neuropathy), right   ° Near syncope   ° Neuromuscular disorder (HCC)   ° Neuropathy   ° Optic neuritis   ° ischaemic optic neuritis, non arteric  ° Optic neuropathy   ° PONV (postoperative nausea and vomiting)   ° Posterior optic neuritis   ° Ptosis of eyelid   ° Sleep apnea   ° "I wear Moses device; I don't wear CPAP" (01/25/2017)  ° Spinal stenosis   °  °Family History  °Problem Relation Age of Onset  ° Stroke Mother   ° Hypertension Mother   ° Early death Father   ° Cancer Father 32  °     sarcoma  ° Parkinson's disease Maternal Aunt   ° Cancer Maternal Uncle   ° Cancer Paternal Grandmother   °     breast  °  °Past Surgical History:  °Procedure Laterality Date  ° AUGMENTATION MAMMAPLASTY    ° 2000, after breast cancer surgery,redone 2010  °  BACK SURGERY    ° BREAST BIOPSY    ° BREAST CAPSULECTOMY WITH IMPLANT EXCHANGE Bilateral 09/28/2018  ° Procedure: REMOVAL OF BILATERAL BREAST IMPLANTS WITH CAPSULECTOMIES AND REPLACEMENTS OF IMPLANTS;  Surgeon: Dillingham, Claire S, DO;  Location: [facility];  Service: Plastics;  Laterality: Bilateral;  ° CARPAL TUNNEL RELEASE Bilateral   ° CESAREAN SECTION  1989  ° ESOPHAGOGASTRODUODENOSCOPY (EGD) WITH ESOPHAGEAL DILATION    ° HIP PINNING,CANNULATED Right 01/26/2017  ° Procedure: CANNULATED SCREWS/RIGHT HIP PINNING;  Surgeon: Blackman, Christopher Y, MD;  [facility];  Service: Orthopedics;    Laterality: Right;  ° INNER EAR SURGERY Right 1995  ° LUMBAR LAMINECTOMY Right 1999  ° Dr Stern  ° MASTECTOMY Bilateral   ° °Social History  ° °Occupational History  ° Not on file  °Tobacco Use  ° Smoking status: Never  ° Smokeless tobacco: Never  °Vaping Use  ° Vaping Use: Never used  °Substance and Sexual Activity  ° Alcohol use: No  ° Drug use: No  ° Sexual activity: Not Currently  ° ° ° ° ° ° °

## 2021-10-23 ENCOUNTER — Other Ambulatory Visit (HOSPITAL_COMMUNITY): Payer: Self-pay

## 2021-10-28 DIAGNOSIS — M7702 Medial epicondylitis, left elbow: Secondary | ICD-10-CM | POA: Diagnosis not present

## 2021-10-28 DIAGNOSIS — M7712 Lateral epicondylitis, left elbow: Secondary | ICD-10-CM | POA: Diagnosis not present

## 2021-10-28 DIAGNOSIS — R29898 Other symptoms and signs involving the musculoskeletal system: Secondary | ICD-10-CM | POA: Diagnosis not present

## 2021-10-30 ENCOUNTER — Encounter: Payer: PRIVATE HEALTH INSURANCE | Admitting: Rehabilitative and Restorative Service Providers"

## 2021-10-31 DIAGNOSIS — R29898 Other symptoms and signs involving the musculoskeletal system: Secondary | ICD-10-CM | POA: Diagnosis not present

## 2021-10-31 DIAGNOSIS — M7712 Lateral epicondylitis, left elbow: Secondary | ICD-10-CM | POA: Diagnosis not present

## 2021-10-31 DIAGNOSIS — M7702 Medial epicondylitis, left elbow: Secondary | ICD-10-CM | POA: Diagnosis not present

## 2021-11-04 DIAGNOSIS — R29898 Other symptoms and signs involving the musculoskeletal system: Secondary | ICD-10-CM | POA: Diagnosis not present

## 2021-11-04 DIAGNOSIS — M7712 Lateral epicondylitis, left elbow: Secondary | ICD-10-CM | POA: Diagnosis not present

## 2021-11-04 DIAGNOSIS — M7702 Medial epicondylitis, left elbow: Secondary | ICD-10-CM | POA: Diagnosis not present

## 2021-11-06 DIAGNOSIS — M7712 Lateral epicondylitis, left elbow: Secondary | ICD-10-CM | POA: Diagnosis not present

## 2021-11-06 DIAGNOSIS — R29898 Other symptoms and signs involving the musculoskeletal system: Secondary | ICD-10-CM | POA: Diagnosis not present

## 2021-11-06 DIAGNOSIS — M7702 Medial epicondylitis, left elbow: Secondary | ICD-10-CM | POA: Diagnosis not present

## 2021-11-10 DIAGNOSIS — R29898 Other symptoms and signs involving the musculoskeletal system: Secondary | ICD-10-CM | POA: Diagnosis not present

## 2021-11-10 DIAGNOSIS — M7712 Lateral epicondylitis, left elbow: Secondary | ICD-10-CM | POA: Diagnosis not present

## 2021-11-10 DIAGNOSIS — M7702 Medial epicondylitis, left elbow: Secondary | ICD-10-CM | POA: Diagnosis not present

## 2021-11-11 DIAGNOSIS — M9902 Segmental and somatic dysfunction of thoracic region: Secondary | ICD-10-CM | POA: Diagnosis not present

## 2021-11-11 DIAGNOSIS — M9907 Segmental and somatic dysfunction of upper extremity: Secondary | ICD-10-CM | POA: Diagnosis not present

## 2021-11-11 DIAGNOSIS — M9901 Segmental and somatic dysfunction of cervical region: Secondary | ICD-10-CM | POA: Diagnosis not present

## 2021-11-11 DIAGNOSIS — M9908 Segmental and somatic dysfunction of rib cage: Secondary | ICD-10-CM | POA: Diagnosis not present

## 2021-11-11 DIAGNOSIS — M25512 Pain in left shoulder: Secondary | ICD-10-CM | POA: Diagnosis not present

## 2021-11-11 DIAGNOSIS — M9903 Segmental and somatic dysfunction of lumbar region: Secondary | ICD-10-CM | POA: Diagnosis not present

## 2021-11-11 DIAGNOSIS — M546 Pain in thoracic spine: Secondary | ICD-10-CM | POA: Diagnosis not present

## 2021-11-12 DIAGNOSIS — L409 Psoriasis, unspecified: Secondary | ICD-10-CM | POA: Diagnosis not present

## 2021-11-12 DIAGNOSIS — M7712 Lateral epicondylitis, left elbow: Secondary | ICD-10-CM | POA: Diagnosis not present

## 2021-11-12 DIAGNOSIS — M791 Myalgia, unspecified site: Secondary | ICD-10-CM | POA: Diagnosis not present

## 2021-11-12 DIAGNOSIS — L4050 Arthropathic psoriasis, unspecified: Secondary | ICD-10-CM | POA: Diagnosis not present

## 2021-11-12 DIAGNOSIS — R251 Tremor, unspecified: Secondary | ICD-10-CM | POA: Diagnosis not present

## 2021-11-12 DIAGNOSIS — M1990 Unspecified osteoarthritis, unspecified site: Secondary | ICD-10-CM | POA: Diagnosis not present

## 2021-11-12 DIAGNOSIS — M7711 Lateral epicondylitis, right elbow: Secondary | ICD-10-CM | POA: Diagnosis not present

## 2021-11-12 DIAGNOSIS — Z79899 Other long term (current) drug therapy: Secondary | ICD-10-CM | POA: Diagnosis not present

## 2021-11-12 DIAGNOSIS — K219 Gastro-esophageal reflux disease without esophagitis: Secondary | ICD-10-CM | POA: Diagnosis not present

## 2021-11-12 DIAGNOSIS — I7300 Raynaud's syndrome without gangrene: Secondary | ICD-10-CM | POA: Diagnosis not present

## 2021-11-12 DIAGNOSIS — L661 Lichen planopilaris: Secondary | ICD-10-CM | POA: Diagnosis not present

## 2021-11-12 DIAGNOSIS — M25529 Pain in unspecified elbow: Secondary | ICD-10-CM | POA: Diagnosis not present

## 2021-11-13 DIAGNOSIS — M7712 Lateral epicondylitis, left elbow: Secondary | ICD-10-CM | POA: Diagnosis not present

## 2021-11-13 DIAGNOSIS — R29898 Other symptoms and signs involving the musculoskeletal system: Secondary | ICD-10-CM | POA: Diagnosis not present

## 2021-11-13 DIAGNOSIS — M7702 Medial epicondylitis, left elbow: Secondary | ICD-10-CM | POA: Diagnosis not present

## 2021-11-17 DIAGNOSIS — M7712 Lateral epicondylitis, left elbow: Secondary | ICD-10-CM | POA: Diagnosis not present

## 2021-11-17 DIAGNOSIS — R29898 Other symptoms and signs involving the musculoskeletal system: Secondary | ICD-10-CM | POA: Diagnosis not present

## 2021-11-17 DIAGNOSIS — M7702 Medial epicondylitis, left elbow: Secondary | ICD-10-CM | POA: Diagnosis not present

## 2021-11-20 DIAGNOSIS — M7702 Medial epicondylitis, left elbow: Secondary | ICD-10-CM | POA: Diagnosis not present

## 2021-11-20 DIAGNOSIS — R29898 Other symptoms and signs involving the musculoskeletal system: Secondary | ICD-10-CM | POA: Diagnosis not present

## 2021-11-20 DIAGNOSIS — M7712 Lateral epicondylitis, left elbow: Secondary | ICD-10-CM | POA: Diagnosis not present

## 2021-11-22 ENCOUNTER — Other Ambulatory Visit (HOSPITAL_COMMUNITY): Payer: Self-pay

## 2021-11-25 DIAGNOSIS — M25562 Pain in left knee: Secondary | ICD-10-CM | POA: Diagnosis not present

## 2021-11-25 DIAGNOSIS — M9906 Segmental and somatic dysfunction of lower extremity: Secondary | ICD-10-CM | POA: Diagnosis not present

## 2021-11-26 ENCOUNTER — Other Ambulatory Visit: Payer: Self-pay | Admitting: Neurology

## 2021-11-26 ENCOUNTER — Other Ambulatory Visit (HOSPITAL_COMMUNITY): Payer: Self-pay

## 2021-11-26 DIAGNOSIS — M7712 Lateral epicondylitis, left elbow: Secondary | ICD-10-CM | POA: Diagnosis not present

## 2021-11-26 DIAGNOSIS — R29898 Other symptoms and signs involving the musculoskeletal system: Secondary | ICD-10-CM | POA: Diagnosis not present

## 2021-11-26 DIAGNOSIS — M7702 Medial epicondylitis, left elbow: Secondary | ICD-10-CM | POA: Diagnosis not present

## 2021-11-26 MED ORDER — MODAFINIL 100 MG PO TABS
100.0000 mg | ORAL_TABLET | Freq: Every day | ORAL | 1 refills | Status: DC
  Filled 2021-11-26: qty 30, 30d supply, fill #0

## 2021-11-26 NOTE — Telephone Encounter (Signed)
Pt called stating that her pharmacy informed her to reach out to her provider about her request for a refill on Modafinil. Please advise. ?

## 2021-11-26 NOTE — Telephone Encounter (Signed)
LVM returning pt call. ? ?Reviewed last note from 07/2021. Looks like Dr. Brett Fairy was offering to refill. Checked drug registry. Showing no recent refills of modafinil.  ?

## 2021-11-27 ENCOUNTER — Other Ambulatory Visit (HOSPITAL_COMMUNITY): Payer: Self-pay

## 2021-11-28 ENCOUNTER — Other Ambulatory Visit (HOSPITAL_COMMUNITY): Payer: Self-pay

## 2021-11-28 ENCOUNTER — Encounter: Payer: Self-pay | Admitting: Neurology

## 2021-11-28 DIAGNOSIS — R29898 Other symptoms and signs involving the musculoskeletal system: Secondary | ICD-10-CM | POA: Diagnosis not present

## 2021-11-28 DIAGNOSIS — M7702 Medial epicondylitis, left elbow: Secondary | ICD-10-CM | POA: Diagnosis not present

## 2021-11-28 DIAGNOSIS — M7712 Lateral epicondylitis, left elbow: Secondary | ICD-10-CM | POA: Diagnosis not present

## 2021-12-01 ENCOUNTER — Telehealth: Payer: Self-pay | Admitting: Neurology

## 2021-12-01 ENCOUNTER — Other Ambulatory Visit (HOSPITAL_COMMUNITY): Payer: Self-pay

## 2021-12-01 DIAGNOSIS — R29898 Other symptoms and signs involving the musculoskeletal system: Secondary | ICD-10-CM | POA: Diagnosis not present

## 2021-12-01 DIAGNOSIS — M7702 Medial epicondylitis, left elbow: Secondary | ICD-10-CM | POA: Diagnosis not present

## 2021-12-01 DIAGNOSIS — M7712 Lateral epicondylitis, left elbow: Secondary | ICD-10-CM | POA: Diagnosis not present

## 2021-12-01 NOTE — Telephone Encounter (Signed)
PA completed for the pt on [employer] ?KEY: [id number] ?Will await determination. ?

## 2021-12-02 ENCOUNTER — Other Ambulatory Visit: Payer: Self-pay | Admitting: Neurology

## 2021-12-02 DIAGNOSIS — R29898 Other symptoms and signs involving the musculoskeletal system: Secondary | ICD-10-CM

## 2021-12-02 DIAGNOSIS — G4730 Sleep apnea, unspecified: Secondary | ICD-10-CM

## 2021-12-02 DIAGNOSIS — G4733 Obstructive sleep apnea (adult) (pediatric): Secondary | ICD-10-CM

## 2021-12-03 DIAGNOSIS — R29898 Other symptoms and signs involving the musculoskeletal system: Secondary | ICD-10-CM | POA: Diagnosis not present

## 2021-12-03 DIAGNOSIS — M7702 Medial epicondylitis, left elbow: Secondary | ICD-10-CM | POA: Diagnosis not present

## 2021-12-03 DIAGNOSIS — M7712 Lateral epicondylitis, left elbow: Secondary | ICD-10-CM | POA: Diagnosis not present

## 2021-12-03 NOTE — Telephone Encounter (Signed)
PA approved for the patient until 11/26/2021.  ?

## 2021-12-16 ENCOUNTER — Other Ambulatory Visit: Payer: Self-pay | Admitting: *Deleted

## 2021-12-16 DIAGNOSIS — G4733 Obstructive sleep apnea (adult) (pediatric): Secondary | ICD-10-CM

## 2021-12-17 ENCOUNTER — Other Ambulatory Visit (HOSPITAL_COMMUNITY): Payer: Self-pay

## 2021-12-17 DIAGNOSIS — G7000 Myasthenia gravis without (acute) exacerbation: Secondary | ICD-10-CM | POA: Diagnosis not present

## 2021-12-17 DIAGNOSIS — H40012 Open angle with borderline findings, low risk, left eye: Secondary | ICD-10-CM | POA: Diagnosis not present

## 2021-12-17 DIAGNOSIS — H47291 Other optic atrophy, right eye: Secondary | ICD-10-CM | POA: Diagnosis not present

## 2021-12-17 DIAGNOSIS — H11823 Conjunctivochalasis, bilateral: Secondary | ICD-10-CM | POA: Diagnosis not present

## 2021-12-17 MED ORDER — CEQUA 0.09 % OP SOLN
OPHTHALMIC | 3 refills | Status: AC
  Filled 2021-12-17: qty 180, 90d supply, fill #0
  Filled 2021-12-23: qty 60, 30d supply, fill #0

## 2021-12-17 MED ORDER — TYRVAYA 0.03 MG/ACT NA SOLN
NASAL | 11 refills | Status: DC
  Filled 2021-12-17: qty 8.4, 30d supply, fill #0

## 2021-12-18 ENCOUNTER — Other Ambulatory Visit (HOSPITAL_COMMUNITY): Payer: Self-pay

## 2021-12-19 ENCOUNTER — Other Ambulatory Visit (HOSPITAL_COMMUNITY): Payer: Self-pay

## 2021-12-19 MED ORDER — HYDROCORTISONE 2.5 % EX OINT
TOPICAL_OINTMENT | CUTANEOUS | 0 refills | Status: AC
  Filled 2021-12-19: qty 60, 30d supply, fill #0

## 2021-12-22 ENCOUNTER — Other Ambulatory Visit (HOSPITAL_COMMUNITY): Payer: Self-pay

## 2021-12-23 ENCOUNTER — Other Ambulatory Visit (HOSPITAL_COMMUNITY): Payer: Self-pay

## 2021-12-24 ENCOUNTER — Other Ambulatory Visit (HOSPITAL_COMMUNITY): Payer: Self-pay

## 2021-12-25 ENCOUNTER — Other Ambulatory Visit (HOSPITAL_COMMUNITY): Payer: Self-pay

## 2021-12-29 DIAGNOSIS — M25511 Pain in right shoulder: Secondary | ICD-10-CM | POA: Diagnosis not present

## 2021-12-29 DIAGNOSIS — M9901 Segmental and somatic dysfunction of cervical region: Secondary | ICD-10-CM | POA: Diagnosis not present

## 2021-12-29 DIAGNOSIS — M9902 Segmental and somatic dysfunction of thoracic region: Secondary | ICD-10-CM | POA: Diagnosis not present

## 2021-12-29 DIAGNOSIS — M79672 Pain in left foot: Secondary | ICD-10-CM | POA: Diagnosis not present

## 2021-12-29 DIAGNOSIS — M9906 Segmental and somatic dysfunction of lower extremity: Secondary | ICD-10-CM | POA: Diagnosis not present

## 2021-12-30 ENCOUNTER — Other Ambulatory Visit (HOSPITAL_COMMUNITY): Payer: Self-pay

## 2021-12-31 ENCOUNTER — Other Ambulatory Visit (HOSPITAL_COMMUNITY): Payer: Self-pay

## 2022-01-01 ENCOUNTER — Other Ambulatory Visit (HOSPITAL_COMMUNITY): Payer: Self-pay

## 2022-01-03 ENCOUNTER — Other Ambulatory Visit (HOSPITAL_COMMUNITY): Payer: Self-pay

## 2022-01-08 DIAGNOSIS — R269 Unspecified abnormalities of gait and mobility: Secondary | ICD-10-CM | POA: Diagnosis not present

## 2022-01-12 DIAGNOSIS — M79605 Pain in left leg: Secondary | ICD-10-CM | POA: Diagnosis not present

## 2022-01-12 DIAGNOSIS — M9906 Segmental and somatic dysfunction of lower extremity: Secondary | ICD-10-CM | POA: Diagnosis not present

## 2022-01-12 DIAGNOSIS — M545 Low back pain, unspecified: Secondary | ICD-10-CM | POA: Diagnosis not present

## 2022-01-12 DIAGNOSIS — M9904 Segmental and somatic dysfunction of sacral region: Secondary | ICD-10-CM | POA: Diagnosis not present

## 2022-01-12 DIAGNOSIS — M546 Pain in thoracic spine: Secondary | ICD-10-CM | POA: Diagnosis not present

## 2022-01-12 DIAGNOSIS — M9903 Segmental and somatic dysfunction of lumbar region: Secondary | ICD-10-CM | POA: Diagnosis not present

## 2022-01-12 DIAGNOSIS — M9905 Segmental and somatic dysfunction of pelvic region: Secondary | ICD-10-CM | POA: Diagnosis not present

## 2022-01-26 ENCOUNTER — Other Ambulatory Visit (HOSPITAL_COMMUNITY): Payer: Self-pay

## 2022-01-26 DIAGNOSIS — M9906 Segmental and somatic dysfunction of lower extremity: Secondary | ICD-10-CM | POA: Diagnosis not present

## 2022-01-26 DIAGNOSIS — M25551 Pain in right hip: Secondary | ICD-10-CM | POA: Diagnosis not present

## 2022-01-26 DIAGNOSIS — M9903 Segmental and somatic dysfunction of lumbar region: Secondary | ICD-10-CM | POA: Diagnosis not present

## 2022-01-26 DIAGNOSIS — M25552 Pain in left hip: Secondary | ICD-10-CM | POA: Diagnosis not present

## 2022-01-26 DIAGNOSIS — M9908 Segmental and somatic dysfunction of rib cage: Secondary | ICD-10-CM | POA: Diagnosis not present

## 2022-01-26 DIAGNOSIS — M9902 Segmental and somatic dysfunction of thoracic region: Secondary | ICD-10-CM | POA: Diagnosis not present

## 2022-01-26 DIAGNOSIS — M545 Low back pain, unspecified: Secondary | ICD-10-CM | POA: Diagnosis not present

## 2022-01-26 DIAGNOSIS — M9905 Segmental and somatic dysfunction of pelvic region: Secondary | ICD-10-CM | POA: Diagnosis not present

## 2022-01-26 MED ORDER — CLOBETASOL PROPIONATE 0.05 % EX SOLN
CUTANEOUS | 2 refills | Status: DC
  Filled 2022-01-26: qty 50, 30d supply, fill #0

## 2022-01-28 ENCOUNTER — Other Ambulatory Visit (HOSPITAL_COMMUNITY): Payer: Self-pay

## 2022-02-06 DIAGNOSIS — L309 Dermatitis, unspecified: Secondary | ICD-10-CM | POA: Diagnosis not present

## 2022-02-06 DIAGNOSIS — L661 Lichen planopilaris: Secondary | ICD-10-CM | POA: Diagnosis not present

## 2022-02-06 DIAGNOSIS — L409 Psoriasis, unspecified: Secondary | ICD-10-CM | POA: Diagnosis not present

## 2022-02-06 DIAGNOSIS — L603 Nail dystrophy: Secondary | ICD-10-CM | POA: Diagnosis not present

## 2022-02-06 DIAGNOSIS — B351 Tinea unguium: Secondary | ICD-10-CM | POA: Diagnosis not present

## 2022-02-06 DIAGNOSIS — L719 Rosacea, unspecified: Secondary | ICD-10-CM | POA: Diagnosis not present

## 2022-02-10 DIAGNOSIS — H40012 Open angle with borderline findings, low risk, left eye: Secondary | ICD-10-CM | POA: Diagnosis not present

## 2022-02-10 DIAGNOSIS — H47291 Other optic atrophy, right eye: Secondary | ICD-10-CM | POA: Diagnosis not present

## 2022-02-12 DIAGNOSIS — L409 Psoriasis, unspecified: Secondary | ICD-10-CM | POA: Diagnosis not present

## 2022-02-12 DIAGNOSIS — M791 Myalgia, unspecified site: Secondary | ICD-10-CM | POA: Diagnosis not present

## 2022-02-12 DIAGNOSIS — M25529 Pain in unspecified elbow: Secondary | ICD-10-CM | POA: Diagnosis not present

## 2022-02-12 DIAGNOSIS — L4050 Arthropathic psoriasis, unspecified: Secondary | ICD-10-CM | POA: Diagnosis not present

## 2022-02-12 DIAGNOSIS — M7711 Lateral epicondylitis, right elbow: Secondary | ICD-10-CM | POA: Diagnosis not present

## 2022-02-12 DIAGNOSIS — M1990 Unspecified osteoarthritis, unspecified site: Secondary | ICD-10-CM | POA: Diagnosis not present

## 2022-02-12 DIAGNOSIS — I7300 Raynaud's syndrome without gangrene: Secondary | ICD-10-CM | POA: Diagnosis not present

## 2022-02-12 DIAGNOSIS — Z79899 Other long term (current) drug therapy: Secondary | ICD-10-CM | POA: Diagnosis not present

## 2022-02-12 DIAGNOSIS — L661 Lichen planopilaris: Secondary | ICD-10-CM | POA: Diagnosis not present

## 2022-02-12 DIAGNOSIS — R251 Tremor, unspecified: Secondary | ICD-10-CM | POA: Diagnosis not present

## 2022-02-12 DIAGNOSIS — M7712 Lateral epicondylitis, left elbow: Secondary | ICD-10-CM | POA: Diagnosis not present

## 2022-02-12 DIAGNOSIS — K219 Gastro-esophageal reflux disease without esophagitis: Secondary | ICD-10-CM | POA: Diagnosis not present

## 2022-02-16 DIAGNOSIS — H40012 Open angle with borderline findings, low risk, left eye: Secondary | ICD-10-CM | POA: Diagnosis not present

## 2022-02-16 DIAGNOSIS — H47291 Other optic atrophy, right eye: Secondary | ICD-10-CM | POA: Diagnosis not present

## 2022-02-17 ENCOUNTER — Other Ambulatory Visit (HOSPITAL_COMMUNITY): Payer: Self-pay

## 2022-02-23 DIAGNOSIS — M4604 Spinal enthesopathy, thoracic region: Secondary | ICD-10-CM | POA: Diagnosis not present

## 2022-02-23 DIAGNOSIS — M545 Low back pain, unspecified: Secondary | ICD-10-CM | POA: Diagnosis not present

## 2022-02-23 DIAGNOSIS — M25552 Pain in left hip: Secondary | ICD-10-CM | POA: Diagnosis not present

## 2022-02-23 DIAGNOSIS — M9903 Segmental and somatic dysfunction of lumbar region: Secondary | ICD-10-CM | POA: Diagnosis not present

## 2022-02-23 DIAGNOSIS — M9906 Segmental and somatic dysfunction of lower extremity: Secondary | ICD-10-CM | POA: Diagnosis not present

## 2022-02-23 DIAGNOSIS — M546 Pain in thoracic spine: Secondary | ICD-10-CM | POA: Diagnosis not present

## 2022-02-23 DIAGNOSIS — M9904 Segmental and somatic dysfunction of sacral region: Secondary | ICD-10-CM | POA: Diagnosis not present

## 2022-02-23 DIAGNOSIS — M5137 Other intervertebral disc degeneration, lumbosacral region: Secondary | ICD-10-CM | POA: Diagnosis not present

## 2022-02-23 DIAGNOSIS — M9905 Segmental and somatic dysfunction of pelvic region: Secondary | ICD-10-CM | POA: Diagnosis not present

## 2022-02-23 DIAGNOSIS — M47817 Spondylosis without myelopathy or radiculopathy, lumbosacral region: Secondary | ICD-10-CM | POA: Diagnosis not present

## 2022-03-25 ENCOUNTER — Other Ambulatory Visit (HOSPITAL_COMMUNITY): Payer: Self-pay

## 2022-03-26 DIAGNOSIS — M9905 Segmental and somatic dysfunction of pelvic region: Secondary | ICD-10-CM | POA: Diagnosis not present

## 2022-03-26 DIAGNOSIS — M4306 Spondylolysis, lumbar region: Secondary | ICD-10-CM | POA: Diagnosis not present

## 2022-03-26 DIAGNOSIS — M545 Low back pain, unspecified: Secondary | ICD-10-CM | POA: Diagnosis not present

## 2022-03-26 DIAGNOSIS — M9903 Segmental and somatic dysfunction of lumbar region: Secondary | ICD-10-CM | POA: Diagnosis not present

## 2022-03-26 DIAGNOSIS — M79605 Pain in left leg: Secondary | ICD-10-CM | POA: Diagnosis not present

## 2022-03-26 DIAGNOSIS — M9906 Segmental and somatic dysfunction of lower extremity: Secondary | ICD-10-CM | POA: Diagnosis not present

## 2022-03-27 DIAGNOSIS — L603 Nail dystrophy: Secondary | ICD-10-CM | POA: Diagnosis not present

## 2022-04-01 DIAGNOSIS — R32 Unspecified urinary incontinence: Secondary | ICD-10-CM | POA: Diagnosis not present

## 2022-04-01 DIAGNOSIS — M810 Age-related osteoporosis without current pathological fracture: Secondary | ICD-10-CM | POA: Diagnosis not present

## 2022-04-01 DIAGNOSIS — L661 Lichen planopilaris: Secondary | ICD-10-CM | POA: Diagnosis not present

## 2022-04-01 DIAGNOSIS — Z0000 Encounter for general adult medical examination without abnormal findings: Secondary | ICD-10-CM | POA: Diagnosis not present

## 2022-04-01 DIAGNOSIS — Z01419 Encounter for gynecological examination (general) (routine) without abnormal findings: Secondary | ICD-10-CM | POA: Diagnosis not present

## 2022-04-01 DIAGNOSIS — N952 Postmenopausal atrophic vaginitis: Secondary | ICD-10-CM | POA: Diagnosis not present

## 2022-04-01 DIAGNOSIS — Z9013 Acquired absence of bilateral breasts and nipples: Secondary | ICD-10-CM | POA: Diagnosis not present

## 2022-04-01 DIAGNOSIS — Z853 Personal history of malignant neoplasm of breast: Secondary | ICD-10-CM | POA: Diagnosis not present

## 2022-04-01 DIAGNOSIS — G7000 Myasthenia gravis without (acute) exacerbation: Secondary | ICD-10-CM | POA: Diagnosis not present

## 2022-04-01 DIAGNOSIS — I10 Essential (primary) hypertension: Secondary | ICD-10-CM | POA: Diagnosis not present

## 2022-04-02 ENCOUNTER — Telehealth (INDEPENDENT_AMBULATORY_CARE_PROVIDER_SITE_OTHER): Payer: Medicare Other | Admitting: Neurology

## 2022-04-02 ENCOUNTER — Encounter: Payer: Self-pay | Admitting: Neurology

## 2022-04-02 ENCOUNTER — Telehealth: Payer: Self-pay | Admitting: Neurology

## 2022-04-02 DIAGNOSIS — Z9989 Dependence on other enabling machines and devices: Secondary | ICD-10-CM

## 2022-04-02 DIAGNOSIS — G4730 Sleep apnea, unspecified: Secondary | ICD-10-CM | POA: Diagnosis not present

## 2022-04-02 DIAGNOSIS — G4733 Obstructive sleep apnea (adult) (pediatric): Secondary | ICD-10-CM | POA: Diagnosis not present

## 2022-04-02 DIAGNOSIS — R260 Ataxic gait: Secondary | ICD-10-CM

## 2022-04-02 DIAGNOSIS — G7000 Myasthenia gravis without (acute) exacerbation: Secondary | ICD-10-CM

## 2022-04-02 NOTE — Telephone Encounter (Signed)
Pt is asking for a call to discuss that since April she is not been able to get her CPAP.  Pt is asking for a call on this as soon as possible

## 2022-04-02 NOTE — Progress Notes (Signed)
CM sent to [facility] for new order ?

## 2022-04-02 NOTE — Progress Notes (Signed)
[name]   SLEEP MEDICINE CLINIC   Provider:   Larey Seat, M.D.  Referring Provider: Lajean Manes, MD Primary Care Physician:  Lajean Manes, MD  Virtual Visit via Video Note  I connected with [name] on 04/02/22 at  3:00 PM EDT by a video enabled telemedicine application and verified that I am speaking with the correct person using two identifiers.  Location: Patient: at home  Provider: at [facility]    I discussed the limitations of evaluation and management by telemedicine and the availability of in person appointments. The patient expressed understanding and agreed to proceed.  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 She was told she couldn't get that. She is using a travel CPAP but misses the humidification function. I will order a new CPAP through [employer]- medicare and supplement.  Epworth is 8-9 points/ 24 .  Residual AHI is 2.3/h. setting is 8-12 cm.   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 Her CPAP data were accessed through her phone, and he is compliant. AHI is under 5. Average pressure 9.8 L/min. She feels less sleepy. She lives with her mother , who turned, 79 and has begun to have dementia, her maternal uncle is 67. Fibromyalgia has been calm. Sero-negative myasthenia was always our working  diagnosis, but Dr Lucinda Dell initiated a single fiber EMG and that returned negative . She reports a right handed  tremor, dominant hand , no cogwheeling, but loss of grip strength. She made Dr Nanine Means  aware- he did not think she has MG. She has seen improving symptoms after single fiber EMG- an effect of dry needles, and she felt 14 days much better- and no ptosis. Dr Lafonda Mosses , DO, has been working adjustments.    How likely are you to doze in the following situations: 0 = not likely, 1 =  slight chance, 2 = moderate chance, 3 = high chance  Sitting and Reading? 1 Watching Television?  1 Sitting inactive in a public place (theater or meeting)? Lying down in the afternoon when circumstances permit?1 Sitting and talking to someone?1 Sitting quietly after lunch without alcohol?1  In a car, while stopped for a few minutes in traffic? As a passenger in a car for an hour without a break?  Total = 5/ 24   FSS 36/ 63 points.   Right tremor Titubation, ptosis.    Rv from 01-20-2021,  Patient highly compliant with her CPAP, also no detailed data sets are available.  Fatigue score remained high at 40 points and Epworth sleepiness score was endorsed at 9/ 24 points.  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  She underwent evaluation by ENT and was actually found to have laryngitis.  She started on Prilosec, acyclovir and prednisone cleared the laryngitis, but she had to maintain a proton pump inhibitor twice a day to keep  from recurring GERD. She was told she may have had a viral, herpetic Infection that caused"  internal shingles ". Carries a diagnosis of Psoriatic rheumatological disorder. She saw [facility] specialist Dr. 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  And inconclusive single-fiber EMG at [facility] in 2013 and a negative rep stim test in 2016  were also quoted.  She had wondered about possible generalized myasthenia gravis because over the last 4 to 6 years she has been fairly asymptomatic when it comes to ocular weakness she has not had recurrent diplopia or ptosis for some time.  However, she has been having episodes of tremor and jerkiness in her legs and sometimes she has to sit down.  2 weeks prior to her visit at [facility] in mid April 2022 she had an episode and slid down to the floor.  She was  tachycardic during these episodes , was short of breath but denied chest pain or the feeling of thumping palpitations.   These episodes have occurred about every 2 weeks.  Many but not all occur after an exercise or exertion.  She has noticed some lower limb limb numbness and occasional pins and needle sensation below the waist but no bowel or bladder dysfunction, no falls, and recent spine imaging was unremarkable for cause of bilateral lower limb weakness.    Dr. Martin Majestic referred her to Dr. Einar Gip here in town for cardiac work-up.  She has also noticed some tremor with handwriting and was using the steering wheel by driving force and this has been getting worse in the right upper extremity which is her dominant hand.  There is a family history of atypical parkinsonism that she is concerned about.  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 I reviewed her medications. Cardiovascular work up is not yet completed.        Assessment and Plan:I will order an autotitration CPAP, 4 cm to 12 cm water    Follow Up Instructions:RV in 60-90 days on new CPAP.     I discussed the assessment and treatment plan with the patient. The patient was provided an opportunity to ask questions and all were answered. The patient agreed with the plan and demonstrated an understanding of the instructions.   The patient was advised to call back or seek an in-person evaluation if the symptoms worsen or if the condition fails to improve as anticipated.  I provided 21 minutes of non-face-to-face time during this encounter.   Larey Seat, MD  1) OSA / tremor, ptosis  Essence: There is no evidence of PD- normal, relaxed muscle tone-  But she feels clumsy and has trouble to sense her point of gravity. Titubation and right hand tremor at  rest and with action,  Tremor is reported.  She reports this with action.  Reports paroxysmal large amplitude( fast) movements, I have not witnessed yet. Overshooting movements make it hard to write.  I know of no diagnosis to explain this.   OSA :  Her  sleepiness had some improvement while on CPAP - she is willing to retest for positional apnea, by a HST without CPAP- and while she is not sleeping on her back, non-supine only.  I will offer to refill today of Mestinon and modafinil. She feels better on it.     Larey Seat, MD  Cc Emi Belfast, MD

## 2022-04-02 NOTE — Telephone Encounter (Signed)
Called the patient back. The DME company has not been set her up with a new auto cpap from the orders sent back in march. She states that she had a machine at some point which she returned to the company and so she should by insurance be eligible for a new machine she states. I informed her that I would check into this. Since November was last ov we actually for insurance purposes may need to have a ov completed to discuss compliance and order new machine again. Hopefully we can use the 2015 sleep study to get her set up with new machine. Otherwise she may need to completed the HST that was ordered by Dr Brett Fairy.

## 2022-04-02 NOTE — Patient Instructions (Signed)
KEEPING IT CLEAN: CPAP HYGIENE PROPER UPKEEP OF YOUR CPAP MACHINE CAN HELP ENSURE THE DEVICE FUNCTIONS PROPERLY CPAP CLEANING INSTRUCTIONS Along with proper CPAP cleaning it is recommended that you replace your mask, tubing and filters once very 3 months and more frequently if you are sick.   DAILY CLEANING Do not use moisturizing soaps, bleach, scented oils, chlorine, or alcohol-based solutions to clean your supplies. These solutions may cause irritation to your skin and lungs and may reduce the life of your products. Dawn Dish soap or Comparable works best for daily cleaning.  **If you've been sick, it's smart to wash your mask, tubing, humidifier and filter daily until your cold, flu or virus symptoms are gone. That can help reduce the amount of time you spend under the weather.  Before using your mask -wash your face daily with soap and water to remove excess facial oils. Wipe down your mask (including areas that come in contact with your skin) using a damp towel with soap and warm water. This will remove any oils, dead skin cells, and sweat on the mask that can affect the quality of the seal. Gently rinse with a clean towel and let the mask air-dry out of direct sunlight. You can also use unscented baby wipes or pre-moistened towels designed specifically for cleaning CPAP masks, which are available on-line. DO NOT USE CLOROX OR DISINFECTING WIPES. If your unit has a humidifier, empty any leftover water instead of letting in sit in the unit all day. Refill the humidifier with clean, distilled water right before bedtime for optimal use  WEEKLY (OR MORE FREQUENT) CLEANING Your mask and tubing need a full bath at least once a week to keep it free of dust, bacteria, and germs. (During COVID-19 or any other flu/virus we recommend more frequent cleaning) Clean the CPAP tubing, nasal mask, and headgear in a bathroom sink filled with warm water and a few drops of ammonia-free, mild dish detergent. Avoid  using stronger cleaning products, as they may damage the mask or leave harmful residue. Swirl all parts around for about five minutes, rinse well and let air dry during the day. Hang the tubing over the shower rod, on a towel rack or in the laundry room to ensure all the water drips out. The mask and headgear can be air-dried on a towel or hung on a hook or hanger. You should also wipe down your CPAP machine with a damp cloth. Ensure the unit is unplugged. The towel shouldn't be too damp or wet, as water could get into the machine. Clean the filter by removing it and rinsing it in warm tap water. Run it under the water and squeeze to make sure there is no dust. Then blot down the filter with a towel. Do not wash your machine's white filter, if one is present--those are disposable and should be replaced every two weeks. If you are recovering from being sick, we recommend changing the filter sooner. If your CPAP has a humidifier, that also needs to be cleaned weekly. Empty any remaining water and then wash the water chamber in the sink with warm soapy water. Rinse well and drain out as much of the water as possible. Let the chamber air-dry before placing it back into the CPAP unit. Every other week you should disinfect the humidifier. Do that by soaking it in a solution of one-part vinegar to five parts water for 30 minutes, thoroughly rinsing and then placing in your dishwasher's top rack for washing. And   keep it clean by using only distilled water to prevent mineral deposits that can build up and cause damage to your machine.  IMPORTANT TIPS Make caring for your CPAP equipment part of your morning routine. Keep machine and accessories out of direct sunlight to avoid damaging them. Never use bleach to clean accessories. Place machine on a level surface and away from curtains that may interfere with the air intake. Keep track of when you should order replacement parts for your mask and accessories so that  you always get the most out of your CPAP. You can also sign up for Auto Supply by contacting our DME department at [email or web address]  The following are examples of soap that may be used: [employer] baby soap, Ivory soap (plain).  With a little upkeep, your CPAP can continue to help you breathe better for a long time. Just a few minutes a day can help keep your CPAP running efficiently for years to come.  If you have a CPAP, but are struggling with compliance, check out our no mask oral appliance, for those with mild to moderate sleep apnea.  Call and schedule a consultation with one of our sleep medicine physicians, or ask your doctor about a sleep referral to the [facility].  

## 2022-04-06 DIAGNOSIS — M48061 Spinal stenosis, lumbar region without neurogenic claudication: Secondary | ICD-10-CM | POA: Diagnosis not present

## 2022-04-06 DIAGNOSIS — M4807 Spinal stenosis, lumbosacral region: Secondary | ICD-10-CM | POA: Diagnosis not present

## 2022-04-06 DIAGNOSIS — Z9889 Other specified postprocedural states: Secondary | ICD-10-CM | POA: Diagnosis not present

## 2022-04-06 DIAGNOSIS — M47816 Spondylosis without myelopathy or radiculopathy, lumbar region: Secondary | ICD-10-CM | POA: Diagnosis not present

## 2022-04-06 DIAGNOSIS — M5126 Other intervertebral disc displacement, lumbar region: Secondary | ICD-10-CM | POA: Diagnosis not present

## 2022-04-09 ENCOUNTER — Other Ambulatory Visit: Payer: Self-pay | Admitting: Obstetrics and Gynecology

## 2022-04-09 DIAGNOSIS — Z13820 Encounter for screening for osteoporosis: Secondary | ICD-10-CM

## 2022-04-10 DIAGNOSIS — M9903 Segmental and somatic dysfunction of lumbar region: Secondary | ICD-10-CM | POA: Diagnosis not present

## 2022-04-10 DIAGNOSIS — M546 Pain in thoracic spine: Secondary | ICD-10-CM | POA: Diagnosis not present

## 2022-04-10 DIAGNOSIS — M25552 Pain in left hip: Secondary | ICD-10-CM | POA: Diagnosis not present

## 2022-04-10 DIAGNOSIS — M5451 Vertebrogenic low back pain: Secondary | ICD-10-CM | POA: Diagnosis not present

## 2022-04-10 DIAGNOSIS — M4306 Spondylolysis, lumbar region: Secondary | ICD-10-CM | POA: Diagnosis not present

## 2022-04-10 DIAGNOSIS — M9905 Segmental and somatic dysfunction of pelvic region: Secondary | ICD-10-CM | POA: Diagnosis not present

## 2022-04-10 DIAGNOSIS — M25571 Pain in right ankle and joints of right foot: Secondary | ICD-10-CM | POA: Diagnosis not present

## 2022-04-10 DIAGNOSIS — M9904 Segmental and somatic dysfunction of sacral region: Secondary | ICD-10-CM | POA: Diagnosis not present

## 2022-04-10 DIAGNOSIS — M9906 Segmental and somatic dysfunction of lower extremity: Secondary | ICD-10-CM | POA: Diagnosis not present

## 2022-04-15 DIAGNOSIS — M25571 Pain in right ankle and joints of right foot: Secondary | ICD-10-CM | POA: Diagnosis not present

## 2022-04-15 DIAGNOSIS — M25552 Pain in left hip: Secondary | ICD-10-CM | POA: Diagnosis not present

## 2022-04-15 DIAGNOSIS — M6281 Muscle weakness (generalized): Secondary | ICD-10-CM | POA: Diagnosis not present

## 2022-04-29 DIAGNOSIS — M791 Myalgia, unspecified site: Secondary | ICD-10-CM | POA: Diagnosis not present

## 2022-04-29 DIAGNOSIS — R269 Unspecified abnormalities of gait and mobility: Secondary | ICD-10-CM | POA: Diagnosis not present

## 2022-04-29 DIAGNOSIS — K219 Gastro-esophageal reflux disease without esophagitis: Secondary | ICD-10-CM | POA: Diagnosis not present

## 2022-05-01 DIAGNOSIS — M25552 Pain in left hip: Secondary | ICD-10-CM | POA: Diagnosis not present

## 2022-05-01 DIAGNOSIS — M9902 Segmental and somatic dysfunction of thoracic region: Secondary | ICD-10-CM | POA: Diagnosis not present

## 2022-05-01 DIAGNOSIS — M542 Cervicalgia: Secondary | ICD-10-CM | POA: Diagnosis not present

## 2022-05-01 DIAGNOSIS — M546 Pain in thoracic spine: Secondary | ICD-10-CM | POA: Diagnosis not present

## 2022-05-01 DIAGNOSIS — M25571 Pain in right ankle and joints of right foot: Secondary | ICD-10-CM | POA: Diagnosis not present

## 2022-05-01 DIAGNOSIS — M9901 Segmental and somatic dysfunction of cervical region: Secondary | ICD-10-CM | POA: Diagnosis not present

## 2022-05-01 DIAGNOSIS — M6281 Muscle weakness (generalized): Secondary | ICD-10-CM | POA: Diagnosis not present

## 2022-05-13 DIAGNOSIS — M6281 Muscle weakness (generalized): Secondary | ICD-10-CM | POA: Diagnosis not present

## 2022-05-13 DIAGNOSIS — M25571 Pain in right ankle and joints of right foot: Secondary | ICD-10-CM | POA: Diagnosis not present

## 2022-05-13 DIAGNOSIS — M25552 Pain in left hip: Secondary | ICD-10-CM | POA: Diagnosis not present

## 2022-05-18 DIAGNOSIS — L661 Lichen planopilaris: Secondary | ICD-10-CM | POA: Diagnosis not present

## 2022-05-18 DIAGNOSIS — L409 Psoriasis, unspecified: Secondary | ICD-10-CM | POA: Diagnosis not present

## 2022-05-18 DIAGNOSIS — I7300 Raynaud's syndrome without gangrene: Secondary | ICD-10-CM | POA: Diagnosis not present

## 2022-05-18 DIAGNOSIS — Z79899 Other long term (current) drug therapy: Secondary | ICD-10-CM | POA: Diagnosis not present

## 2022-05-18 DIAGNOSIS — K219 Gastro-esophageal reflux disease without esophagitis: Secondary | ICD-10-CM | POA: Diagnosis not present

## 2022-05-18 DIAGNOSIS — L4050 Arthropathic psoriasis, unspecified: Secondary | ICD-10-CM | POA: Diagnosis not present

## 2022-05-18 DIAGNOSIS — M1990 Unspecified osteoarthritis, unspecified site: Secondary | ICD-10-CM | POA: Diagnosis not present

## 2022-05-18 DIAGNOSIS — M791 Myalgia, unspecified site: Secondary | ICD-10-CM | POA: Diagnosis not present

## 2022-05-21 DIAGNOSIS — L658 Other specified nonscarring hair loss: Secondary | ICD-10-CM | POA: Diagnosis not present

## 2022-05-21 DIAGNOSIS — L309 Dermatitis, unspecified: Secondary | ICD-10-CM | POA: Diagnosis not present

## 2022-05-21 DIAGNOSIS — L661 Lichen planopilaris: Secondary | ICD-10-CM | POA: Diagnosis not present

## 2022-05-27 ENCOUNTER — Ambulatory Visit (HOSPITAL_BASED_OUTPATIENT_CLINIC_OR_DEPARTMENT_OTHER): Payer: Medicare Other | Attending: Sports Medicine | Admitting: Physical Therapy

## 2022-05-27 ENCOUNTER — Encounter (HOSPITAL_BASED_OUTPATIENT_CLINIC_OR_DEPARTMENT_OTHER): Payer: Self-pay | Admitting: Physical Therapy

## 2022-05-27 DIAGNOSIS — M6281 Muscle weakness (generalized): Secondary | ICD-10-CM

## 2022-05-27 DIAGNOSIS — M4306 Spondylolysis, lumbar region: Secondary | ICD-10-CM | POA: Diagnosis not present

## 2022-05-27 DIAGNOSIS — M25571 Pain in right ankle and joints of right foot: Secondary | ICD-10-CM | POA: Insufficient documentation

## 2022-05-27 DIAGNOSIS — M25552 Pain in left hip: Secondary | ICD-10-CM

## 2022-05-27 DIAGNOSIS — M546 Pain in thoracic spine: Secondary | ICD-10-CM

## 2022-05-27 DIAGNOSIS — M5459 Other low back pain: Secondary | ICD-10-CM

## 2022-05-27 DIAGNOSIS — M5451 Vertebrogenic low back pain: Secondary | ICD-10-CM | POA: Diagnosis not present

## 2022-05-27 NOTE — Therapy (Signed)
OUTPATIENT PHYSICAL THERAPY LOWER EXTREMITY EVALUATION   Patient Name: Felicia Cain MRN: [id number] DOB:Aug 16, 1956, 66 y.o., female Today's Date: 05/27/2022   PT End of Session - 05/27/22 1704     Visit Number 1    Number of Visits 12    Date for PT Re-Evaluation 07/08/22    Authorization Type Medicare    Progress Note Due on Visit 10    PT Start Time 0901    PT Stop Time 0945    PT Time Calculation (min) 44 min    Activity Tolerance Patient limited by pain;Patient tolerated treatment well    Behavior During Therapy [facility] for tasks assessed/performed             Past Medical History:  Diagnosis Date   Anxiety    Arthritis    "back" (01/25/2017)   Brachial neuritis    neuropathy   Chronic lower back pain    DCIS (ductal carcinoma in situ)    "left side?"   Esophageal stricture    stricture with dysphasia   Fibromyalgia    GERD (gastroesophageal reflux disease)    H/O Doppler ultrasound 2006   see scanned study   H/O echocardiogram 2004, 2013   Hepatitis A 1961   [place]epidemic in my city"   History of cardiac monitoring 2006   History of nuclear stress test 2004   see scanned study   Migraine    "in the past; cycle related" (01/25/2017)   Myasthenia gravis in crisis ([place]) 04/2014   respiratory crisis   NAION (non-arteritic anterior ischemic optic neuropathy), right    Near syncope    Neuromuscular disorder (HCC)    Neuropathy    Optic neuritis    ischaemic optic neuritis, non arteric   Optic neuropathy    PONV (postoperative nausea and vomiting)    Posterior optic neuritis    Ptosis of eyelid    Sleep apnea    "I wear Moses device; I don't wear CPAP" (01/25/2017)   Spinal stenosis    Past Surgical History:  Procedure Laterality Date   AUGMENTATION MAMMAPLASTY     2000, after breast cancer surgery,redone 2010   BACK SURGERY     BREAST BIOPSY     BREAST CAPSULECTOMY WITH IMPLANT EXCHANGE Bilateral 09/28/2018   Procedure: REMOVAL OF BILATERAL BREAST  IMPLANTS WITH CAPSULECTOMIES AND REPLACEMENTS OF IMPLANTS;  Surgeon: Wallace Going, DO;  Location: [place];  Service: Plastics;  Laterality: Bilateral;   CARPAL TUNNEL RELEASE Bilateral    CESAREAN SECTION  1989   ESOPHAGOGASTRODUODENOSCOPY (EGD) WITH ESOPHAGEAL DILATION     HIP PINNING,CANNULATED Right 01/26/2017   Procedure: CANNULATED SCREWS/RIGHT HIP PINNING;  Surgeon: Mcarthur Rossetti, MD;  Location: [place];  Service: Orthopedics;  Laterality: Right;   INNER EAR SURGERY Right 1995   LUMBAR LAMINECTOMY Right 1999   [name]   MASTECTOMY Bilateral    Patient Active Problem List   Diagnosis Date Noted   Pain in left elbow 10/21/2021   Pain in right elbow 10/21/2021   Tremor of right hand 07/23/2021   Titubation 07/23/2021   Weakness of both lower extremities 01/20/2021   Gait instability 01/20/2021   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 ([place]) 09/20/2019   Peroneal neuropathy at knee, left 03/19/2019   Patellar contusion, left, sequela 02/09/2019   Rib pain on right side 11/24/2018   Acquired absence of both breasts 09/20/2018   Ruptured right breast implant 08/26/2018   History of  reconstruction of both breasts 07/08/2018   Breast implant capsular contracture 07/08/2018   Breast pain 07/08/2018   Retrognathia 09/15/2017   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   Status post-operative repair of closed fracture of right hip 09/08/2017   Acute pain of right knee 05/24/2017   Hip fracture ([place]) 01/25/2017   Closed hip fracture, right, initial encounter ([place])    Preop examination    Fibromyalgia    Neuropathy    Corneal abrasion    Nausea    Fall    Seronegative myasthenia gravis ([place]) 07/19/2015   Acute exacerbation of chronic low back pain 07/19/2015   Leukocytosis 07/19/2015   Myasthenia gravis in remission ([place]) 10/23/2014   OSA on CPAP 10/23/2014   Hypersomnia, persistent 10/23/2014   Hypoxemia  08/14/2014   Myasthenia gravis with exacerbation ([place]) 08/14/2014   Myasthenia gravis with exacerbation, ocular ([place]) 12/08/2012   Right shoulder pain 08/02/2012   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   History of optic neuritis 06/27/2012   Rosacea 06/27/2012   S/P laminectomy 06/27/2012   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   S/P bilateral mastectomy 06/26/2012   BRCA negative 06/26/2012   GERD (gastroesophageal reflux disease) 06/26/2012   Menopause 06/26/2012   Ankle pain 06/02/2012    PCP: Lajean Manes, MD  REFERRING PROVIDER: Gerda Diss, DO  REFERRING DIAG:  [id number] (ICD-10-CM) - Pain in left hip  M25.571 (ICD-10-CM) - Pain in right ankle and joints of right foot  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 Was unable to walk for 6 months. Fracture of lb, left ankle painful and unstable maybe for psoratic arthrisit.  My right leg isn't straight.  Pain all the way down my spine when I flex my head. All household activities increase pain, getting in and out of car.  Sometimes takes me 20 mins to get out of bed. Pt caring for 49 YO mother. Was very active. Do yoga and Pilates at least 5 x weekly including walking outside  PERTINENT HISTORY: Myasthenia Gravis, neuropathy, previous R hip fracture, RA, previous laminectomy, B mastectomy, B Carpal tunnel release  PAIN:  Are you having pain? Yes: NPRS scale: 6/10 current, 10/10 max, min: 6/10 Pain location: LB/right hip and left ankle Pain description: ache Aggravating factors: underdo or  overdo. Relieving factors: meds  PRECAUTIONS: None  WEIGHT BEARING RESTRICTIONS No  FALLS:  Has patient fallen in last 6 months? No  LIVING ENVIRONMENT: Lives with: lives with their family Lives in: House/apartment Stairs: Yes: External: 3 steps; can reach both Has following equipment at home: None  OCCUPATION: disable  PLOF: Independent  PATIENT GOALS decrease pain, more flexibility, improve toleration to exercising she completes on her own   OBJECTIVE:   DIAGNOSTIC FINDINGS: 10/2020 . L1-2: Newly seen mild disc bulge towards the left. No compressive stenosis. 2. L4-5: Disc bulge. Mild facet and ligamentous hypertrophy. Mild stenosis of both lateral recesses. Some potential either L5 nerve could be affected in this location. No visible change since 2018. 3. L5-S1: Previous right hemilaminectomy and discectomy. Chronic loss  of disc height. Small endplate osteophytes. Mild bilateral foraminal narrowing, but without visible compression of the exiting L5 nerves. No visible change since 2018.  PATIENT SURVEYS:  FOTO 36 with goal of 44  COGNITION:  Overall cognitive status: Within functional limits for tasks assessed     SENSATION: WFL   MUSCLE LENGTH: Hamstrings: Right 90 deg; Left 90 deg   POSTURE: anterior pelvic tilt  PALPATION: Moderately TTP paraspinals cervical through lumbar  LOWER EXTREMITY ROM:  Active ROM Right eval Left eval  Hip flexion    Hip extension    Hip abduction    Hip adduction    Hip internal rotation    Hip external rotation    Knee flexion    Knee extension    Ankle dorsiflexion    Ankle plantarflexion    Ankle inversion    Ankle eversion     (Blank rows = not tested)  LOWER EXTREMITY MMT:  MMT Right eval Left eval  Hip flexion 3+ 4  Hip extension    Hip abduction    Hip adduction    Hip internal rotation    Hip external rotation    Knee flexion 4 5  Knee extension 4 5  Ankle dorsiflexion    Ankle  plantarflexion    Ankle inversion    Ankle eversion     (Blank rows = not tested)  Lumbar AROM: limited by pain side bending and rotation. Flex and ext full with pain from flexed position to upright Core str:4/5   SPECIAL TESTS:  SI compression distraction test neg SLR neg Slump neg  FUNCTIONAL TESTS:  5 times sit to stand: 19.47 Timed up and go (TUG): 1402 Berg Balance Scale: 35/56  GAIT: Distance walked: 500 ft Assistive device utilized: None Level of assistance: Complete Independence Comments: right antle inversion    TODAY'S TREATMENT: Evaluation Objective testing   PATIENT EDUCATION:  Education details: Discussed eval findings, rehab rationale and POC and patient is in agreement Person educated: Patient Education method: Explanation Education comprehension: verbalized understanding   HOME EXERCISE PROGRAM: Aquatic to be assigned  ASSESSMENT:  CLINICAL IMPRESSION: Pt is an active 66yo F with multiple impairments on a background of psoriatic arthritis seen today for physical therapy evaluation and treatment for complaints of back and LE pain. She  reports Left sided > pain vs right.  She reports daily exercise regimen of walking outdoors then completing Pilate's or Yoga 5 days a week.  She has begun aquatic therapy at a different facility which she didn't tolerate due to the water being too cold.  She has minor limitations with core ROM and flexibility due to pain. Core and LE strength decreased mostly due to pain as well.  She will benefit from aquatic therapy using properties of water to improve strength, flexibility and decrease pain sx to better manage all  functional impairments, decrease fall risk and improve quality of life.    OBJECTIVE IMPAIRMENTS decreased activity tolerance, decreased balance, difficulty walking, decreased strength, and pain.   ACTIVITY LIMITATIONS carrying, lifting, bending, stairs, transfers, bed mobility, and caring for  others  PARTICIPATION LIMITATIONS: cleaning, shopping, community activity, and yard work  PERSONAL FACTORS Age, Fitness, Past/current experiences, and 3+ comorbidities: psoriatic arthritis; Spondylolysis, lumbar spine, right ankle pain, pain left hip   are also affecting patient's functional outcome.   REHAB POTENTIAL: Good  CLINICAL DECISION MAKING: Unstable/unpredictable  EVALUATION COMPLEXITY: High   GOALS: Goals reviewed with patient? Yes  SHORT TERM GOALS: Target date: 06/17/2022  Pt to tolerate full aquatic session without increase in pain or > 2 rest periods needed Baseline: Goal status: INITIAL  2.  Pt will rise from a seated position without UE use on knees Baseline: Rises pushing up from knees Goal status: INITIAL  3.  Pt will report getting out of bed consistently in less than 10 minutes Baseline: 20 Goal status: INITIAL    LONG TERM GOALS: Target date: 07/08/2022   Pt will be able to get in and out of car without being limited by pain Baseline: needs extra time to complete car transfers Goal status: INITIAL  2.  Pt will be able to walk outdoor >1 hour Baseline: <1hour Goal status: INITIAL  3.  Improve quad strength to 5/5 to demonstrate improved strength Baseline:  Goal status: INITIAL  4.  Pt to reach FOTO stated goal of 55 to demonstrate improved function Baseline: 36 Goal status: INITIAL  5.  Pt will improve on Berg to 45/56 to demonstrate decreased fall risk Baseline: 35/56 Goal status: INITIAL     PLAN: PT FREQUENCY: 1-2x/week  PT DURATION: 6 weeks  PLANNED INTERVENTIONS: Therapeutic exercises, Therapeutic activity, Neuromuscular re-education, Balance training, Gait training, Patient/Family education, Self Care, and Joint mobilization  PLAN FOR NEXT SESSION: aquatic for core and LE flexibility, strengthening.   Denton Meek, PT MPT 05/27/2022, 5:16 PM

## 2022-05-28 ENCOUNTER — Other Ambulatory Visit: Payer: Self-pay | Admitting: Gastroenterology

## 2022-05-28 DIAGNOSIS — R079 Chest pain, unspecified: Secondary | ICD-10-CM | POA: Diagnosis not present

## 2022-05-28 DIAGNOSIS — R1310 Dysphagia, unspecified: Secondary | ICD-10-CM

## 2022-05-28 DIAGNOSIS — K219 Gastro-esophageal reflux disease without esophagitis: Secondary | ICD-10-CM | POA: Diagnosis not present

## 2022-05-28 DIAGNOSIS — K625 Hemorrhage of anus and rectum: Secondary | ICD-10-CM | POA: Diagnosis not present

## 2022-05-29 ENCOUNTER — Other Ambulatory Visit: Payer: Self-pay | Admitting: Sports Medicine

## 2022-05-29 DIAGNOSIS — M542 Cervicalgia: Secondary | ICD-10-CM | POA: Diagnosis not present

## 2022-05-29 DIAGNOSIS — M9906 Segmental and somatic dysfunction of lower extremity: Secondary | ICD-10-CM | POA: Diagnosis not present

## 2022-05-29 DIAGNOSIS — M9907 Segmental and somatic dysfunction of upper extremity: Secondary | ICD-10-CM | POA: Diagnosis not present

## 2022-05-29 DIAGNOSIS — M9901 Segmental and somatic dysfunction of cervical region: Secondary | ICD-10-CM | POA: Diagnosis not present

## 2022-05-29 DIAGNOSIS — M25511 Pain in right shoulder: Secondary | ICD-10-CM | POA: Diagnosis not present

## 2022-05-29 DIAGNOSIS — M9902 Segmental and somatic dysfunction of thoracic region: Secondary | ICD-10-CM | POA: Diagnosis not present

## 2022-05-29 DIAGNOSIS — M25571 Pain in right ankle and joints of right foot: Secondary | ICD-10-CM | POA: Diagnosis not present

## 2022-05-29 DIAGNOSIS — M25512 Pain in left shoulder: Secondary | ICD-10-CM | POA: Diagnosis not present

## 2022-06-02 ENCOUNTER — Ambulatory Visit (HOSPITAL_BASED_OUTPATIENT_CLINIC_OR_DEPARTMENT_OTHER): Payer: Medicare Other | Admitting: Physical Therapy

## 2022-06-02 ENCOUNTER — Encounter (HOSPITAL_BASED_OUTPATIENT_CLINIC_OR_DEPARTMENT_OTHER): Payer: Self-pay | Admitting: Physical Therapy

## 2022-06-02 DIAGNOSIS — M546 Pain in thoracic spine: Secondary | ICD-10-CM

## 2022-06-02 DIAGNOSIS — M25552 Pain in left hip: Secondary | ICD-10-CM | POA: Diagnosis not present

## 2022-06-02 DIAGNOSIS — M25571 Pain in right ankle and joints of right foot: Secondary | ICD-10-CM | POA: Diagnosis not present

## 2022-06-02 DIAGNOSIS — M4306 Spondylolysis, lumbar region: Secondary | ICD-10-CM | POA: Diagnosis not present

## 2022-06-02 DIAGNOSIS — M6281 Muscle weakness (generalized): Secondary | ICD-10-CM

## 2022-06-02 DIAGNOSIS — M5451 Vertebrogenic low back pain: Secondary | ICD-10-CM | POA: Diagnosis not present

## 2022-06-02 DIAGNOSIS — M5459 Other low back pain: Secondary | ICD-10-CM

## 2022-06-02 NOTE — Therapy (Signed)
OUTPATIENT PHYSICAL THERAPY LOWER EXTREMITY TREATMENT   Patient Name: Felicia Cain MRN: [id number] DOB:February 22, 1956, 66 y.o., female Today's Date: 06/02/2022   PT End of Session - 06/02/22 0741     Visit Number 2    Number of Visits 12    Date for PT Re-Evaluation 07/08/22    Authorization Type Medicare    Progress Note Due on Visit 10    PT Start Time 0731    PT Stop Time 0815    PT Time Calculation (min) 44 min    Activity Tolerance Patient tolerated treatment well    Behavior During Therapy [facility] for tasks assessed/performed             Past Medical History:  Diagnosis Date   Anxiety    Arthritis    "back" (01/25/2017)   Brachial neuritis    neuropathy   Chronic lower back pain    DCIS (ductal carcinoma in situ)    "left side?"   Esophageal stricture    stricture with dysphasia   Fibromyalgia    GERD (gastroesophageal reflux disease)    H/O Doppler ultrasound 2006   see scanned study   H/O echocardiogram 2004, 2013   Hepatitis A 1961   [place]epidemic [place] city"   History of cardiac monitoring 2006   History of nuclear stress test 2004   see scanned study   Migraine    "in the past; cycle related" (01/25/2017)   Myasthenia gravis in crisis ([place]) 04/2014   respiratory crisis   NAION (non-arteritic anterior ischemic optic neuropathy), right    Near syncope    Neuromuscular disorder (HCC)    Neuropathy    Optic neuritis    ischaemic optic neuritis, non arteric   Optic neuropathy    PONV (postoperative nausea and vomiting)    Posterior optic neuritis    Ptosis of eyelid    Sleep apnea    "I wear Moses device; I don't wear CPAP" (01/25/2017)   Spinal stenosis    Past Surgical History:  Procedure Laterality Date   AUGMENTATION MAMMAPLASTY     2000, after breast cancer surgery,redone 2010   BACK SURGERY     BREAST BIOPSY     BREAST CAPSULECTOMY WITH IMPLANT EXCHANGE Bilateral 09/28/2018   Procedure: REMOVAL OF BILATERAL BREAST IMPLANTS WITH CAPSULECTOMIES  AND REPLACEMENTS OF IMPLANTS;  Surgeon: Wallace Going, DO;  Location: [facility];  Service: Plastics;  Laterality: Bilateral;   CARPAL TUNNEL RELEASE Bilateral    CESAREAN SECTION  1989   ESOPHAGOGASTRODUODENOSCOPY (EGD) WITH ESOPHAGEAL DILATION     HIP PINNING,CANNULATED Right 01/26/2017   Procedure: CANNULATED SCREWS/RIGHT HIP PINNING;  Surgeon: Mcarthur Rossetti, MD;  Location: [facility];  Service: Orthopedics;  Laterality: Right;   INNER EAR SURGERY Right 1995   LUMBAR LAMINECTOMY Right 1999   [name]   MASTECTOMY Bilateral    Patient Active Problem List   Diagnosis Date Noted   Pain in left elbow 10/21/2021   Pain in right elbow 10/21/2021   Tremor of right hand 07/23/2021   Titubation 07/23/2021   Weakness of both lower extremities 01/20/2021   Gait instability 01/20/2021   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 ([place]) 09/20/2019   Peroneal neuropathy at knee, left 03/19/2019   Patellar contusion, left, sequela 02/09/2019   Rib pain on right side 11/24/2018   Acquired absence of both breasts 09/20/2018   Ruptured right breast implant 08/26/2018   History of reconstruction of both  breasts 07/08/2018   Breast implant capsular contracture 07/08/2018   Breast pain 07/08/2018   Retrognathia 09/15/2017   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   Status post-operative repair of closed fracture of right hip 09/08/2017   Acute pain of right knee 05/24/2017   Hip fracture ([place]) 01/25/2017   Closed hip fracture, right, initial encounter [facility])    Preop examination    Fibromyalgia    Neuropathy    Corneal abrasion    Nausea    Fall    Seronegative myasthenia gravis ([place]) 07/19/2015   Acute exacerbation of chronic low back pain 07/19/2015   Leukocytosis 07/19/2015   Myasthenia gravis in remission ([place]) 10/23/2014   OSA on CPAP 10/23/2014   Hypersomnia, persistent 10/23/2014   Hypoxemia 08/14/2014   Myasthenia gravis  with exacerbation ([place]) 08/14/2014   Myasthenia gravis with exacerbation, ocular ([place]) 12/08/2012   Right shoulder pain 08/02/2012   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   History of optic neuritis 06/27/2012   Rosacea 06/27/2012   S/P laminectomy 06/27/2012   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   S/P bilateral mastectomy 06/26/2012   BRCA negative 06/26/2012   GERD (gastroesophageal reflux disease) 06/26/2012   Menopause 06/26/2012   Ankle pain 06/02/2012    PCP: Lajean Manes, MD  REFERRING PROVIDER: Gerda Diss, DO  REFERRING DIAG:  [phone] (ICD-10-CM) - Pain in left hip  M25.571 (ICD-10-CM) - Pain in right ankle and joints of right foot  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  She has been sleeping in a wrist brace.     PERTINENT HISTORY: Myasthenia Gravis, neuropathy, previous R hip fracture, RA, previous laminectomy, B mastectomy, B Carpal tunnel release  PAIN:  Are you having pain? Yes: NPRS scale: 10/10 current Pain location: Lt toe, R wrist; bilat hips Pain description: ache Aggravating factors: underdo or overdo. Relieving factors: meds  PRECAUTIONS: None  WEIGHT BEARING RESTRICTIONS No  FALLS:  Has patient fallen in last 6 months? No  LIVING ENVIRONMENT: Lives with: lives with their family Lives in: House/apartment Stairs: Yes: External: 3 steps; can reach both Has following equipment at home: None  OCCUPATION: disabled;  former Internal Medicine physician  PLOF: Independent  PATIENT GOALS  decrease pain, more flexibility, improve toleration to exercising she completes on her own   OBJECTIVE:   DIAGNOSTIC FINDINGS: 10/2020 . L1-2: Newly seen mild disc bulge towards the left. No compressive stenosis. 2. L4-5: Disc bulge. Mild facet and ligamentous hypertrophy. Mild stenosis of both lateral recesses. Some potential either L5 nerve could be affected in this location. No visible change since 2018. 3. L5-S1: Previous right hemilaminectomy and discectomy. Chronic loss of disc height. Small endplate osteophytes. Mild bilateral foraminal narrowing, but without visible compression of the exiting L5 nerves. No visible change since 2018.  PATIENT SURVEYS:  FOTO 36 with goal of 31  COGNITION:  Overall cognitive status: Within functional limits for tasks assessed     SENSATION: WFL   MUSCLE LENGTH: Hamstrings: Right 90 deg; Left 90 deg   POSTURE: anterior  pelvic tilt  PALPATION: Moderately TTP paraspinals cervical through lumbar  LOWER EXTREMITY ROM:  Active ROM Right eval Left eval  Hip flexion    Hip extension    Hip abduction    Hip adduction    Hip internal rotation    Hip external rotation    Knee flexion    Knee extension    Ankle dorsiflexion    Ankle plantarflexion    Ankle inversion    Ankle eversion     (Blank rows = not tested)  LOWER EXTREMITY MMT:  MMT Right eval Left eval  Hip flexion 3+ 4  Hip extension    Hip abduction    Hip adduction    Hip internal rotation    Hip external rotation    Knee flexion 4 5  Knee extension 4 5  Ankle dorsiflexion    Ankle plantarflexion    Ankle inversion    Ankle eversion     (Blank rows = not tested)  Lumbar AROM: limited by pain side bending and rotation. Flex and ext full with pain from flexed position to upright Core str:4/5   SPECIAL TESTS:  SI compression distraction test neg SLR neg Slump neg  FUNCTIONAL TESTS:  5 times sit to stand: 19.47 Timed up and go (TUG): 1402 Berg  Balance Scale: 35/56  GAIT: Distance walked: 500 ft Assistive device utilized: None Level of assistance: Complete Independence Comments: right antle inversion    TODAY'S TREATMENT: Pt seen for aquatic therapy today.  Treatment took place in water 3.25-4.5 ft in depth at the [facility]. Temp of water was 91.  Pt entered/exited the pool via stairs independently with bilat rail.  Holding wall:  Stork stance with IR/ER; hip crosses;  Heel/toe raises; hip ext Weight shifts forward / backward, rolling through foot/toes With therapist in water for close SBA; pt holding white barbell:  walking forward and backward;  sidestepping; marching in plance, then marching forward/ backward  - multiple laps of each direction  Holding wall:  squats  Return to walking holding barbell   Pt requires the buoyancy and hydrostatic pressure of water for support, and to offload joints by unweighting joint load by at least 50 % in navel deep water and by at least 75-80% in chest to neck deep water.  Viscosity of the water is needed for resistance of strengthening. Water current perturbations provides challenge to standing balance requiring increased core activation.     PATIENT EDUCATION:  Education details: intro to [employer] Person educated: Patient Education method: Explanation Education comprehension: verbalized understanding   HOME EXERCISE PROGRAM: Aquatic to be assigned  ASSESSMENT:  CLINICAL IMPRESSION: Upon arrival, pt informed therapist that she had near death experience in water as a child.  Without therapist in water, pt able to move away from wall holding floatation device, but with very guarded, rigid LE.  Improved gait quality while holding barbell and with SBA of therapist in water with her.  She reported some dizziness when looking at feet in water; resolved when she focused on horizon instead. She reported reduction of pain at completion of session.  She will benefit from  aquatic therapy using properties of water to improve strength, flexibility and decrease pain sx to better manage all  functional impairments, decrease fall risk and improve quality of life.    OBJECTIVE IMPAIRMENTS decreased activity tolerance, decreased balance, difficulty walking, decreased strength, and pain.   ACTIVITY LIMITATIONS carrying, lifting, bending, stairs, transfers, bed mobility, and caring for others  PARTICIPATION LIMITATIONS: cleaning, shopping, community activity, and yard work  PERSONAL FACTORS Age, Fitness, Past/current experiences, and 3+ comorbidities: psoriatic arthritis; Spondylolysis, lumbar spine, right ankle pain, pain left hip   are also affecting patient's functional outcome.   REHAB POTENTIAL: Good  CLINICAL DECISION MAKING: Unstable/unpredictable  EVALUATION COMPLEXITY: High   GOALS: Goals reviewed with patient? Yes  SHORT TERM GOALS: Target date: 06/17/22 Pt to tolerate full aquatic session without increase in pain or > 2 rest periods needed Baseline: Goal status: INITIAL  2.  Pt will rise from a seated position without UE use on knees Baseline: Rises pushing up from knees Goal status: INITIAL  3.  Pt will report getting out of bed consistently in less than 10 minutes Baseline: 20 Goal status: INITIAL    LONG TERM GOALS: Target date: 07/08/22  Pt will be able to get in and out of car without being limited by pain Baseline: needs extra time to complete car transfers Goal status: INITIAL  2.  Pt will be able to walk outdoor >1 hour Baseline: <1hour Goal status: INITIAL  3.  Improve quad strength to 5/5 to demonstrate improved strength Baseline:  Goal status: INITIAL  4.  Pt to reach FOTO stated goal of 55 to demonstrate improved function Baseline: 36 Goal status: INITIAL  5.  Pt will improve on Berg to 45/56 to demonstrate decreased fall risk Baseline: 35/56 Goal status: INITIAL     PLAN: PT FREQUENCY: 1-2x/week  PT  DURATION: 6 weeks  PLANNED INTERVENTIONS: Therapeutic exercises, Therapeutic activity, Neuromuscular re-education, Balance training, Gait training, Patient/Family education, Self Care, and Joint mobilization  PLAN FOR NEXT SESSION: aquatic for core and LE flexibility, strengthening.  Kerin Perna, PTA 06/02/22 3:26 PM [facility] [address] [place], [place], [address] Phone: [phone]   Fax:  [phone]

## 2022-06-03 ENCOUNTER — Telehealth: Payer: Self-pay | Admitting: Neurology

## 2022-06-03 NOTE — Telephone Encounter (Signed)
error 

## 2022-06-05 DIAGNOSIS — M25511 Pain in right shoulder: Secondary | ICD-10-CM | POA: Diagnosis not present

## 2022-06-05 DIAGNOSIS — M25551 Pain in right hip: Secondary | ICD-10-CM | POA: Diagnosis not present

## 2022-06-05 DIAGNOSIS — M9908 Segmental and somatic dysfunction of rib cage: Secondary | ICD-10-CM | POA: Diagnosis not present

## 2022-06-05 DIAGNOSIS — M9901 Segmental and somatic dysfunction of cervical region: Secondary | ICD-10-CM | POA: Diagnosis not present

## 2022-06-05 DIAGNOSIS — M9900 Segmental and somatic dysfunction of head region: Secondary | ICD-10-CM | POA: Diagnosis not present

## 2022-06-08 ENCOUNTER — Encounter: Payer: Self-pay | Admitting: Neurology

## 2022-06-08 ENCOUNTER — Ambulatory Visit (INDEPENDENT_AMBULATORY_CARE_PROVIDER_SITE_OTHER): Payer: Medicare Other | Admitting: Neurology

## 2022-06-08 VITALS — BP 114/74 | HR 73 | Ht 66.0 in | Wt 152.0 lb

## 2022-06-08 DIAGNOSIS — R2681 Unsteadiness on feet: Secondary | ICD-10-CM | POA: Diagnosis not present

## 2022-06-08 DIAGNOSIS — G7000 Myasthenia gravis without (acute) exacerbation: Secondary | ICD-10-CM

## 2022-06-08 DIAGNOSIS — R251 Tremor, unspecified: Secondary | ICD-10-CM

## 2022-06-08 MED ORDER — MODAFINIL 100 MG PO TABS
100.0000 mg | ORAL_TABLET | Freq: Every day | ORAL | 1 refills | Status: DC
Start: 2022-06-08 — End: 2023-01-25

## 2022-06-08 MED ORDER — PYRIDOSTIGMINE BROMIDE 60 MG PO TABS
30.0000 mg | ORAL_TABLET | Freq: Three times a day (TID) | ORAL | 5 refills | Status: DC
Start: 2022-06-08 — End: 2023-06-14

## 2022-06-08 MED ORDER — TRETINOIN 0.1 % EX CREA: TOPICAL_CREAM | CUTANEOUS | 5 refills | Status: DC

## 2022-06-08 NOTE — Patient Instructions (Signed)
Pyridostigmine Tablets What is this medication? PYRIDOSTIGMINE (peer id oh STIG meen) treats myasthenia gravis, a condition that causes muscles to easily weaken or fatigue. It works by decreasing muscle weakness and loss of movement. This medicine may be used for other purposes; ask your health care provider or pharmacist if you have questions. COMMON BRAND NAME(S): Mestinon What should I tell my care team before I take this medication? They need to know if you have any of these conditions: Asthma Difficulty passing urine Heart disease Infection in abdomen, peritonitis Irregular, slow heartbeat Kidney disease Seizures Stomach or bowel obstruction or ulcers Thyroid disease An unusual or allergic reaction to pyridostigmine, bromides, other medications, foods, dyes, or preservatives Pregnant or trying to get pregnant Breast-feeding How should I use this medication? Take this medication by mouth with a glass of water. Take it as directed on the prescription label. Keep taking it unless your care team tells you to stop. Talk to your care team about the use of this medication in children. Special care may be needed. Overdosage: If you think you have taken too much of this medicine contact a poison control center or emergency room at once. NOTE: This medicine is only for you. Do not share this medicine with others. What if I miss a dose? If you miss a dose, take it as soon as you can. If it is almost time for your next dose, take only that dose. Do not take double or extra doses. What may interact with this medication? Do not take this medication with any of the following: Other medications for myasthenia gravis like neostigmine Quinine This medication may also interact with the following: Atropine Bethanechol Disopyramide Edrophonium Guanadrel Guanethidine Mecamylamine Medications that block muscle or nerve pain This list may not describe all possible interactions. Give your health  care provider a list of all the medicines, herbs, non-prescription drugs, or dietary supplements you use. Also tell them if you smoke, drink alcohol, or use illegal drugs. Some items may interact with your medicine. What should I watch for while using this medication? Visit your care team for regular checks on your progress. Tell your care team if your symptoms do not start to get better or if they get worse. Wear a medical ID bracelet or chain. Carry a card that describes your condition. List the medications and doses you take on the card. What side effects may I notice from receiving this medication? Side effects that you should report to your care team as soon as possible: Allergic reactions--skin rash, itching, hives, swelling of the face, lips, tongue, or throat Side effects that usually do not require medical attention (report to your care team if they continue or are bothersome): Diarrhea Excessive sweating Muscle pain or cramps Muscle weakness Nausea Stomach pain This list may not describe all possible side effects. Call your doctor for medical advice about side effects. You may report side effects to FDA at 1-800-FDA-1088. Where should I keep my medication? Keep out of the reach of children and pets. Store between 15 and 30 degrees C (59 and 86 degrees F). Protect from moisture. Keep the container tightly closed. Get rid of any unused medication after the expiration date. To get rid of medications that are no longer needed or have expired: Take the medication to a medication take-back program. Check with your pharmacy or law enforcement to find a location. If you cannot return the medication, check the label or package insert to see if the medication should be thrown  out in the garbage or flushed down the toilet. If you are not sure, ask your care team. If it is safe to put it in the trash, pour the medication out of the container. Mix the medication with cat litter, dirt, coffee  grounds, or other unwanted substance. Seal the mixture in a bag or container. Put it in the trash. NOTE: This sheet is a summary. It may not cover all possible information. If you have questions about this medicine, talk to your doctor, pharmacist, or health care provider.  2023 Elsevier/Gold Standard (2007-10-15 00:00:00)

## 2022-06-08 NOTE — Progress Notes (Signed)
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 forgetfulness, concerned about cognitive decline.  I do think there is anxiety and depression present.    Essence: 06-08-2022:  There is no evidence of mainstream , classic PD- normal, relaxed muscle tone-  But she feels  rigid, stiff, is clumsy and has trouble to sense her point of gravity. Reading is ore difficult.  Titubation and right hand tremor at rest and with action,  Tremor is reported.  She reports this with action.  Reports paroxysmal large amplitude( fast) movements, I have not witnessed yet. Overshooting movements make it hard to write.  I know of no diagnosis to explain this.   If she can tolerate dry needling, I like for her to utilize it.  She felt better in term of  muscle tone, spasms, tremor and remarkably for ptosis.   Hip adductor weakness since hip surgery. Right over left , with some loss of muscle mass, gluteal and calf.  She feels the shakiness of her body is part of a syndrome, and she may be right.  OSA on a new ResMed machine -  Has had experineced a  dental sift on mandibular device. MOSES - Her  sleepiness had some improvement while on CPAP.   I will offer to refill today of Mestinon and modafinil. She feels better  on it.  RV in 12 months.     Larey Seat, MD  Felipa Eth , MD

## 2022-06-09 ENCOUNTER — Encounter (HOSPITAL_BASED_OUTPATIENT_CLINIC_OR_DEPARTMENT_OTHER): Payer: Self-pay | Admitting: Physical Therapy

## 2022-06-09 ENCOUNTER — Ambulatory Visit (HOSPITAL_BASED_OUTPATIENT_CLINIC_OR_DEPARTMENT_OTHER): Payer: Medicare Other | Attending: Sports Medicine | Admitting: Physical Therapy

## 2022-06-09 DIAGNOSIS — M25552 Pain in left hip: Secondary | ICD-10-CM | POA: Insufficient documentation

## 2022-06-09 DIAGNOSIS — M546 Pain in thoracic spine: Secondary | ICD-10-CM | POA: Diagnosis not present

## 2022-06-09 DIAGNOSIS — M6281 Muscle weakness (generalized): Secondary | ICD-10-CM | POA: Diagnosis not present

## 2022-06-09 DIAGNOSIS — M25632 Stiffness of left wrist, not elsewhere classified: Secondary | ICD-10-CM | POA: Insufficient documentation

## 2022-06-09 DIAGNOSIS — M5459 Other low back pain: Secondary | ICD-10-CM | POA: Diagnosis not present

## 2022-06-09 NOTE — Therapy (Signed)
OUTPATIENT PHYSICAL THERAPY LOWER EXTREMITY TREATMENT   Patient Name: Felicia Cain MRN: [id number] DOB:January 04, 1956, 66 y.o., female Today's Date: 06/09/2022   PT End of Session - 06/09/22 0734     Visit Number 3    Number of Visits 12    Date for PT Re-Evaluation 07/08/22    Authorization Type Medicare    Progress Note Due on Visit 10    PT Start Time 0732    PT Stop Time 0811    PT Time Calculation (min) 39 min    Behavior During Therapy [facility] for tasks assessed/performed             Past Medical History:  Diagnosis Date   Anxiety    Arthritis    "back" (01/25/2017)   Brachial neuritis    neuropathy   Chronic lower back pain    DCIS (ductal carcinoma in situ)    "left side?"   Esophageal stricture    stricture with dysphasia   Fibromyalgia    GERD (gastroesophageal reflux disease)    H/O Doppler ultrasound 2006   see scanned study   H/O echocardiogram 2004, 2013   Hepatitis A 1961   [place]ep[place]ic [place]"   History of cardiac monitoring 2006   History of nuclear stress test 2004   see scanned study   Migraine    "in the past; cycle related" (01/25/2017)   Myasthenia gravis in crisis ([place]) 04/2014   respiratory crisis   NAION (non-arteritic anterior ischemic optic neuropathy), right    Near syncope    Neuromuscular disorder (HCC)    Neuropathy    Optic neuritis    ischaemic optic neuritis, non arteric   Optic neuropathy    PONV (postoperative nausea and vomiting)    Posterior optic neuritis    Ptosis of eyelid    Sleep apnea    "I wear Moses device; I don't wear CPAP" (01/25/2017)   Spinal stenosis    Past Surgical History:  Procedure Laterality Date   AUGMENTATION MAMMAPLASTY     2000, after breast cancer surgery,redone 2010   BACK SURGERY     BREAST BIOPSY     BREAST CAPSULECTOMY WITH IMPLANT EXCHANGE Bilateral 09/28/2018   Procedure: REMOVAL OF BILATERAL BREAST IMPLANTS WITH CAPSULECTOMIES AND REPLACEMENTS OF IMPLANTS;  Surgeon: Wallace Going, DO;  Location: [place];  Service: Plastics;  Laterality: Bilateral;   CARPAL TUNNEL RELEASE Bilateral    CESAREAN SECTION  1989   ESOPHAGOGASTRODUODENOSCOPY (EGD) WITH ESOPHAGEAL DILATION     HIP PINNING,CANNULATED Right 01/26/2017   Procedure: CANNULATED SCREWS/RIGHT HIP PINNING;  Surgeon: Mcarthur Rossetti, MD;  Location: [place];  Service: Orthopedics;  Laterality: Right;   INNER EAR SURGERY Right 1995   LUMBAR LAMINECTOMY Right 1999   [name]   MASTECTOMY Bilateral    Patient Active Problem List   Diagnosis Date Noted   Pain in left elbow 10/21/2021   Pain in right elbow 10/21/2021   Tremor of right hand 07/23/2021   Titubation 07/23/2021   Weakness of both lower extremities 01/20/2021   Gait instability 01/20/2021   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 ([place]) 09/20/2019   Peroneal neuropathy at knee, left 03/19/2019   Patellar contusion, left, sequela 02/09/2019   Rib pain on right side 11/24/2018   Acquired absence of both breasts 09/20/2018   Ruptured right breast implant 08/26/2018   History of reconstruction of both breasts 07/08/2018   Breast implant capsular contracture 07/08/2018  Breast pain 07/08/2018   Retrognathia 09/15/2017   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   Status post-operative repair of closed fracture of right hip 09/08/2017   Acute pain of right knee 05/24/2017   Hip fracture ([place]) 01/25/2017   Closed hip fracture, right, initial encounter [facility])    Preop examination    Fibromyalgia    Neuropathy    Corneal abrasion    Nausea    Fall    Seronegative myasthenia gravis ([place]) 07/19/2015   Acute exacerbation of chronic low back pain 07/19/2015   Leukocytosis 07/19/2015   Myasthenia gravis in remission ([place]) 10/23/2014   OSA on CPAP 10/23/2014   Hypersomnia, persistent 10/23/2014   Hypoxemia 08/14/2014   Myasthenia gravis with exacerbation ([place]) 08/14/2014   Myasthenia  gravis with exacerbation, ocular ([place]) 12/08/2012   Right shoulder pain 08/02/2012   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   History of optic neuritis 06/27/2012   Rosacea 06/27/2012   S/P laminectomy 06/27/2012   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   S/P bilateral mastectomy 06/26/2012   BRCA negative 06/26/2012   GERD (gastroesophageal reflux disease) 06/26/2012   Menopause 06/26/2012   Ankle pain 06/02/2012    PCP: Lajean Manes, MD  REFERRING PROVIDER: Gerda Diss, DO  REFERRING DIAG:  [phone] (ICD-10-CM) - Pain in left hip  M25.571 (ICD-10-CM) - Pain in right ankle and joints of right foot  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   Neck has been painful since then.  Had a follow up Dr. Paulla Fore and started NSAIDS.     PERTINENT HISTORY: Myasthenia Gravis, neuropathy, previous R hip fracture, RA, previous laminectomy, B mastectomy, B Carpal tunnel release  PAIN:  Are you having pain? Yes: NPRS scale: see below Pain location:  R wrist 8-9/10; neck 7/10,  Pain description: ache, stiffness Aggravating factors: underdo or overdo. Relieving factors: meds  PRECAUTIONS: None  WEIGHT BEARING RESTRICTIONS No  FALLS:  Has patient fallen in last 6 months? No  LIVING ENVIRONMENT: Lives with: lives with their family Lives in: House/apartment Stairs: Yes: External: 3 steps; can reach both Has following equipment at home: None  OCCUPATION: disabled;  former Internal Medicine physician  PLOF: Independent  PATIENT GOALS decrease pain, more  flexibility, improve toleration to exercising she completes on her own   OBJECTIVE:   DIAGNOSTIC FINDINGS: 10/2020 . L1-2: Newly seen mild disc bulge towards the left. No compressive stenosis. 2. L4-5: Disc bulge. Mild facet and ligamentous hypertrophy. Mild stenosis of both lateral recesses. Some potential either L5 nerve could be affected in this location. No visible change since 2018. 3. L5-S1: Previous right hemilaminectomy and discectomy. Chronic loss of disc height. Small endplate osteophytes. Mild bilateral foraminal narrowing, but without visible compression of the exiting L5 nerves. No visible change since 2018.  PATIENT SURVEYS:  FOTO 36 with goal of 100  COGNITION:  Overall cognitive status: Within functional limits for tasks assessed     SENSATION: WFL   MUSCLE LENGTH: Hamstrings: Right 90 deg; Left 90 deg   POSTURE: anterior pelvic tilt  PALPATION:  Moderately TTP paraspinals cervical through lumbar  LOWER EXTREMITY ROM:  Active ROM Right eval Left eval  Hip flexion    Hip extension    Hip abduction    Hip adduction    Hip internal rotation    Hip external rotation    Knee flexion    Knee extension    Ankle dorsiflexion    Ankle plantarflexion    Ankle inversion    Ankle eversion     (Blank rows = not tested)  LOWER EXTREMITY MMT:  MMT Right eval Left eval  Hip flexion 3+ 4  Hip extension    Hip abduction    Hip adduction    Hip internal rotation    Hip external rotation    Knee flexion 4 5  Knee extension 4 5  Ankle dorsiflexion    Ankle plantarflexion    Ankle inversion    Ankle eversion     (Blank rows = not tested)  Lumbar AROM: limited by pain side bending and rotation. Flex and ext full with pain from flexed position to upright Core str:4/5   SPECIAL TESTS:  SI compression distraction test neg SLR neg Slump neg  FUNCTIONAL TESTS:  5 times sit to stand: 19.47 Timed up and go (TUG): 1402 Berg Balance Scale:  35/56  GAIT: Distance walked: 500 ft Assistive device utilized: None Level of assistance: Complete Independence Comments: right antle inversion    TODAY'S TREATMENT: Pt seen for aquatic therapy today.  Treatment took place in water 3.25-4.5 ft in depth at the [place]. Temp of water was 92.  Pt entered/exited the pool via stairs independently with bilat rail.  Holding wall:  Weight shifts forward / backward, rolling through foot/toes Calf stretch Hip abdct; hip ext; heel/toe raises; hip openers (cues to avoid compensation); hip circles clockwise;Stork stance with IR/ER; 1 squat (not tolerated due to reported increased HR when submerged to shoulders)  With therapist in water for close SBA; pt holding white barbell:  walking forward and backward;  sidestepping; marching forward/ backward,  With back against wall:  thin square noodle supporting LE- hamstring, adductor and ITB stretch x 1 rep each leg.    Forward leans at 10, 12, and 2 o'clock for back stretch (with therapist in front; cues for breath)  Pt requires the buoyancy and hydrostatic pressure of water for support, and to offload joints by unweighting joint load by at least 50 % in navel deep water and by at least 75-80% in chest to neck deep water.  Viscosity of the water is needed for resistance of strengthening. Water current perturbations provides challenge to standing balance requiring increased core activation.  PATIENT EDUCATION:  Education details: intro to [employer] Person educated: Patient Education method: Explanation Education comprehension: verbalized understanding   HOME EXERCISE PROGRAM: Aquatic to be assigned  ASSESSMENT:  CLINICAL IMPRESSION: SBA of therapist in water with her when away from wall.  No dizziness when looking at feet in water today.  Much more relaxed gait today with less shuffling.  She continues to report increased heart rate when submerged to shoulders (ie: squat or forward  lean).  Overall, she reported reduction of tightness in body and no increase in pain.  Plan to trial back float next visit (supported by therapist).    She reported reduction of pain at completion of session.  She will benefit from aquatic therapy using properties of water to improve strength, flexibility and decrease pain sx to better manage all  functional impairments,  decrease fall risk and improve quality of life.    OBJECTIVE IMPAIRMENTS decreased activity tolerance, decreased balance, difficulty walking, decreased strength, and pain.   ACTIVITY LIMITATIONS carrying, lifting, bending, stairs, transfers, bed mobility, and caring for others  PARTICIPATION LIMITATIONS: cleaning, shopping, community activity, and yard work  PERSONAL FACTORS Age, Fitness, Past/current experiences, and 3+ comorbidities: psoriatic arthritis; Spondylolysis, lumbar spine, right ankle pain, pain left hip   are also affecting patient's functional outcome.   REHAB POTENTIAL: Good  CLINICAL DECISION MAKING: Unstable/unpredictable  EVALUATION COMPLEXITY: High   GOALS: Goals reviewed with patient? Yes  SHORT TERM GOALS: Target date: 06/17/22 Pt to tolerate full aquatic session without increase in pain or > 2 rest periods needed Baseline: Goal status: INITIAL  2.  Pt will rise from a seated position without UE use on knees Baseline: Rises pushing up from knees Goal status: INITIAL  3.  Pt will report getting out of bed consistently in less than 10 minutes Baseline: 20 Goal status: INITIAL    LONG TERM GOALS: Target date: 07/08/22  Pt will be able to get in and out of car without being limited by pain Baseline: needs extra time to complete car transfers Goal status: INITIAL  2.  Pt will be able to walk outdoor >1 hour Baseline: <1hour Goal status: INITIAL  3.  Improve quad strength to 5/5 to demonstrate improved strength Baseline:  Goal status: INITIAL  4.  Pt to reach FOTO stated goal of 55 to  demonstrate improved function Baseline: 36 Goal status: INITIAL  5.  Pt will improve on Berg to 45/56 to demonstrate decreased fall risk Baseline: 35/56 Goal status: INITIAL     PLAN: PT FREQUENCY: 1-2x/week  PT DURATION: 6 weeks  PLANNED INTERVENTIONS: Therapeutic exercises, Therapeutic activity, Neuromuscular re-education, Balance training, Gait training, Patient/Family education, Self Care, and Joint mobilization  PLAN FOR NEXT SESSION: aquatic for core and LE flexibility, strengthening.  Kerin Perna, PTA 06/09/22 8:28 AM [facility] [address] [place], [place], [address] Phone: [phone]   Fax:  [phone]

## 2022-06-12 ENCOUNTER — Encounter (HOSPITAL_BASED_OUTPATIENT_CLINIC_OR_DEPARTMENT_OTHER): Payer: Self-pay | Admitting: Physical Therapy

## 2022-06-12 ENCOUNTER — Ambulatory Visit (HOSPITAL_BASED_OUTPATIENT_CLINIC_OR_DEPARTMENT_OTHER): Payer: Medicare Other | Admitting: Physical Therapy

## 2022-06-12 DIAGNOSIS — M6281 Muscle weakness (generalized): Secondary | ICD-10-CM

## 2022-06-12 DIAGNOSIS — M9907 Segmental and somatic dysfunction of upper extremity: Secondary | ICD-10-CM | POA: Diagnosis not present

## 2022-06-12 DIAGNOSIS — M546 Pain in thoracic spine: Secondary | ICD-10-CM | POA: Diagnosis not present

## 2022-06-12 DIAGNOSIS — M25632 Stiffness of left wrist, not elsewhere classified: Secondary | ICD-10-CM | POA: Diagnosis not present

## 2022-06-12 DIAGNOSIS — M9902 Segmental and somatic dysfunction of thoracic region: Secondary | ICD-10-CM | POA: Diagnosis not present

## 2022-06-12 DIAGNOSIS — M25552 Pain in left hip: Secondary | ICD-10-CM | POA: Diagnosis not present

## 2022-06-12 DIAGNOSIS — M9908 Segmental and somatic dysfunction of rib cage: Secondary | ICD-10-CM | POA: Diagnosis not present

## 2022-06-12 DIAGNOSIS — M542 Cervicalgia: Secondary | ICD-10-CM | POA: Diagnosis not present

## 2022-06-12 DIAGNOSIS — M5459 Other low back pain: Secondary | ICD-10-CM

## 2022-06-12 DIAGNOSIS — M9901 Segmental and somatic dysfunction of cervical region: Secondary | ICD-10-CM | POA: Diagnosis not present

## 2022-06-12 DIAGNOSIS — M9900 Segmental and somatic dysfunction of head region: Secondary | ICD-10-CM | POA: Diagnosis not present

## 2022-06-12 DIAGNOSIS — M5451 Vertebrogenic low back pain: Secondary | ICD-10-CM | POA: Diagnosis not present

## 2022-06-12 NOTE — Therapy (Signed)
OUTPATIENT PHYSICAL THERAPY LOWER EXTREMITY TREATMENT   Patient Name: Felicia Cain MRN: [id number] DOB:20-Feb-1956, 66 y.o., female Today's Date: 06/12/2022   PT End of Session - 06/12/22 0825     Visit Number 4    Number of Visits 12    Date for PT Re-Evaluation 07/08/22    Authorization Type Medicare    Progress Note Due on Visit 10    PT Start Time 0815    PT Stop Time 0867    PT Time Calculation (min) 40 min    Activity Tolerance Patient tolerated treatment well    Behavior During Therapy [facility] for tasks assessed/performed             Past Medical History:  Diagnosis Date   Anxiety    Arthritis    "back" (01/25/2017)   Brachial neuritis    neuropathy   Chronic lower back pain    DCIS (ductal carcinoma in situ)    "left side?"   Esophageal stricture    stricture with dysphasia   Fibromyalgia    GERD (gastroesophageal reflux disease)    H/O Doppler ultrasound 2006   see scanned study   H/O echocardiogram 2004, 2013   Hepatitis A 1961   [place]"   History of cardiac monitoring 2006   History of nuclear stress test 2004   see scanned study   Migraine    "in the past; cycle related" (01/25/2017)   Myasthenia gravis in crisis ([place]) 04/2014   respiratory crisis   NAION (non-arteritic anterior ischemic optic neuropathy), right    Near syncope    Neuromuscular disorder (HCC)    Neuropathy    Optic neuritis    ischaemic optic neuritis, non arteric   Optic neuropathy    PONV (postoperative nausea and vomiting)    Posterior optic neuritis    Ptosis of eyelid    Sleep apnea    "I wear Moses device; I don't wear CPAP" (01/25/2017)   Spinal stenosis    Past Surgical History:  Procedure Laterality Date   AUGMENTATION MAMMAPLASTY     2000, after breast cancer surgery,redone 2010   BACK SURGERY     BREAST BIOPSY     BREAST CAPSULECTOMY WITH IMPLANT EXCHANGE Bilateral 09/28/2018   Procedure: REMOVAL OF BILATERAL BREAST IMPLANTS WITH CAPSULECTOMIES  AND REPLACEMENTS OF IMPLANTS;  Surgeon: Wallace Going, DO;  Location: [place];  Service: Plastics;  Laterality: Bilateral;   CARPAL TUNNEL RELEASE Bilateral    CESAREAN SECTION  1989   ESOPHAGOGASTRODUODENOSCOPY (EGD) WITH ESOPHAGEAL DILATION     HIP PINNING,CANNULATED Right 01/26/2017   Procedure: CANNULATED SCREWS/RIGHT HIP PINNING;  Surgeon: Mcarthur Rossetti, MD;  Location: [place];  Service: Orthopedics;  Laterality: Right;   INNER EAR SURGERY Right 1995   LUMBAR LAMINECTOMY Right 1999   [name]   MASTECTOMY Bilateral    Patient Active Problem List   Diagnosis Date Noted   Pain in left elbow 10/21/2021   Pain in right elbow 10/21/2021   Tremor of right hand 07/23/2021   Titubation 07/23/2021   Weakness of both lower extremities 01/20/2021   Gait instability 01/20/2021   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 ([place]) 09/20/2019   Peroneal neuropathy at knee, left 03/19/2019   Patellar contusion, left, sequela 02/09/2019   Rib pain on right side 11/24/2018   Acquired absence of both breasts 09/20/2018   Ruptured right breast implant 08/26/2018   History of reconstruction of both  breasts 07/08/2018   Breast implant capsular contracture 07/08/2018   Breast pain 07/08/2018   Retrognathia 09/15/2017   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   Status post-operative repair of closed fracture of right hip 09/08/2017   Acute pain of right knee 05/24/2017   Hip fracture ([place]) 01/25/2017   Closed hip fracture, right, initial encounter [facility])    Preop examination    Fibromyalgia    Neuropathy    Corneal abrasion    Nausea    Fall    Seronegative myasthenia gravis ([place]) 07/19/2015   Acute exacerbation of chronic low back pain 07/19/2015   Leukocytosis 07/19/2015   Myasthenia gravis in remission ([place]) 10/23/2014   OSA on CPAP 10/23/2014   Hypersomnia, persistent 10/23/2014   Hypoxemia 08/14/2014   Myasthenia gravis  with exacerbation ([place]) 08/14/2014   Myasthenia gravis with exacerbation, ocular ([place]) 12/08/2012   Right shoulder pain 08/02/2012   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   History of optic neuritis 06/27/2012   Rosacea 06/27/2012   S/P laminectomy 06/27/2012   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   S/P bilateral mastectomy 06/26/2012   BRCA negative 06/26/2012   GERD (gastroesophageal reflux disease) 06/26/2012   Menopause 06/26/2012   Ankle pain 06/02/2012    PCP: Lajean Manes, MD  REFERRING PROVIDER: Gerda Diss, DO  REFERRING DIAG:  [phone] (ICD-10-CM) - Pain in left hip  M25.571 (ICD-10-CM) - Pain in right ankle and joints of right foot  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 Paulla Fore this morning; wants to avoid getting hair wet today.      PERTINENT HISTORY: Myasthenia Gravis, neuropathy, previous R hip fracture, RA, previous laminectomy, B mastectomy, B Carpal tunnel release  PAIN:  Are you having pain? Yes: NPRS scale: see below Pain location:  R wrist 8-9/10; lower back 4/10, L foot 8/10 Pain description: ache, stiffness Aggravating factors: underdo or overdo. Relieving factors: meds  PRECAUTIONS: None  WEIGHT BEARING RESTRICTIONS No  FALLS:  Has patient fallen in last 6 months? No  LIVING ENVIRONMENT: Lives with: lives with their family Lives in: House/apartment Stairs: Yes: External: 3 steps; can reach both Has following equipment at home: None  OCCUPATION: disabled;  former Internal Medicine  physician  PLOF: Independent  PATIENT GOALS decrease pain, more flexibility, improve toleration to exercising she completes on her own   OBJECTIVE:   DIAGNOSTIC FINDINGS: 10/2020 . L1-2: Newly seen mild disc bulge towards the left. No compressive stenosis. 2. L4-5: Disc bulge. Mild facet and ligamentous hypertrophy. Mild stenosis of both lateral recesses. Some potential either L5 nerve could be affected in this location. No visible change since 2018. 3. L5-S1: Previous right hemilaminectomy and discectomy. Chronic loss of disc height. Small endplate osteophytes. Mild bilateral foraminal narrowing, but without visible compression of the exiting L5 nerves. No visible change since 2018.  PATIENT SURVEYS:  FOTO 36 with goal of 103  COGNITION:  Overall cognitive status: Within functional limits for tasks assessed     SENSATION: WFL   MUSCLE LENGTH: Hamstrings: Right 90 deg; Left  90 deg   POSTURE: anterior pelvic tilt  PALPATION: Moderately TTP paraspinals cervical through lumbar  LOWER EXTREMITY ROM:  Active ROM Right eval Left eval  Hip flexion    Hip extension    Hip abduction    Hip adduction    Hip internal rotation    Hip external rotation    Knee flexion    Knee extension    Ankle dorsiflexion    Ankle plantarflexion    Ankle inversion    Ankle eversion     (Blank rows = not tested)  LOWER EXTREMITY MMT:  MMT Right eval Left eval  Hip flexion 3+ 4  Hip extension    Hip abduction    Hip adduction    Hip internal rotation    Hip external rotation    Knee flexion 4 5  Knee extension 4 5  Ankle dorsiflexion    Ankle plantarflexion    Ankle inversion    Ankle eversion     (Blank rows = not tested)  Lumbar AROM: limited by pain side bending and rotation. Flex and ext full with pain from flexed position to upright Core str:4/5   SPECIAL TESTS:  SI compression distraction test neg SLR neg Slump neg  FUNCTIONAL TESTS:  5 times sit to  stand: 19.47 Timed up and go (TUG): 1402 Berg Balance Scale: 35/56  GAIT: Distance walked: 500 ft Assistive device utilized: None Level of assistance: Complete Independence Comments: right antle inversion    TODAY'S TREATMENT: Pt seen for aquatic therapy today.  Treatment took place in water 3.25-4.5 ft in depth at the [place]. Temp of water was 92.  Pt entered/exited the pool via stairs independently with bilat rail.  Holding wall:  Weight shifts forward / backward, rolling through foot/toes Hip circles (straight knee) CW/ CCW;   alternating hurdle leg; hip abdct / add  Curtsy lunges With support of white barbell:  walking forward;  sidestepping; marching forward;  braiding R/L On 4th step:  STS x 2 reps with heavy cues for form  Pt requires the buoyancy and hydrostatic pressure of water for support, and to offload joints by unweighting joint load by at least 50 % in navel deep water and by at least 75-80% in chest to neck deep water.  Viscosity of the water is needed for resistance of strengthening. Water current perturbations provides challenge to standing balance requiring increased core activation.  PATIENT EDUCATION:  Education details: intro to [employer] Person educated: Patient Education method: Explanation Education comprehension: verbalized understanding   HOME EXERCISE PROGRAM: Aquatic to be assigned  ASSESSMENT:  CLINICAL IMPRESSION: Pt able to complete gait with supervision vs therapist in water with her.  With time and encouragement, pt's gait became more relaxed and fluid.  2 occasions of unsteadiness quickly resolved with step strategy.  Overall, she reported reduction of tightness in body and no increase in pain.  Avoid pt in water up to shoulders/neck due to reported symptoms in heart. Plan to trial back float next visit (supported by therapist).   She will benefit from aquatic therapy using properties of water to improve strength, flexibility  and decrease pain sx to better manage all  functional impairments, decrease fall risk and improve quality of life.     OBJECTIVE IMPAIRMENTS decreased activity tolerance, decreased balance, difficulty walking, decreased strength, and pain.   ACTIVITY LIMITATIONS carrying, lifting, bending, stairs, transfers, bed mobility, and caring for others  PARTICIPATION LIMITATIONS: cleaning, shopping, community activity, and yard work  PERSONAL FACTORS Age, Fitness, Past/current experiences, and 3+ comorbidities: psoriatic arthritis; Spondylolysis, lumbar spine, right ankle pain, pain left hip   are also affecting patient's functional outcome.   REHAB POTENTIAL: Good  CLINICAL DECISION MAKING: Unstable/unpredictable  EVALUATION COMPLEXITY: High   GOALS: Goals reviewed with patient? Yes  SHORT TERM GOALS: Target date: 06/17/22 Pt to tolerate full aquatic session without increase in pain or > 2 rest periods needed Baseline: Goal status: INITIAL  2.  Pt will rise from a seated position without UE use on knees Baseline: Rises pushing up from knees Goal status: INITIAL  3.  Pt will report getting out of bed consistently in less than 10 minutes Baseline: 20 Goal status: INITIAL    LONG TERM GOALS: Target date: 07/08/22  Pt will be able to get in and out of car without being limited by pain Baseline: needs extra time to complete car transfers Goal status: INITIAL  2.  Pt will be able to walk outdoor >1 hour Baseline: <1hour Goal status: INITIAL  3.  Improve quad strength to 5/5 to demonstrate improved strength Baseline:  Goal status: INITIAL  4.  Pt to reach FOTO stated goal of 55 to demonstrate improved function Baseline: 36 Goal status: INITIAL  5.  Pt will improve on Berg to 45/56 to demonstrate decreased fall risk Baseline: 35/56 Goal status: INITIAL     PLAN: PT FREQUENCY: 1-2x/week  PT DURATION: 6 weeks  PLANNED INTERVENTIONS: Therapeutic exercises, Therapeutic  activity, Neuromuscular re-education, Balance training, Gait training, Patient/Family education, Self Care, and Joint mobilization  PLAN FOR NEXT SESSION: aquatic for core and LE flexibility, strengthening.  Kerin Perna, PTA 06/12/22 9:43 AM [facility] [address] [place], [place], [address] Phone: [phone]   Fax:  [phone]

## 2022-06-15 DIAGNOSIS — F419 Anxiety disorder, unspecified: Secondary | ICD-10-CM | POA: Diagnosis not present

## 2022-06-15 DIAGNOSIS — G20A1 Parkinson's disease without dyskinesia, without mention of fluctuations: Secondary | ICD-10-CM | POA: Diagnosis not present

## 2022-06-16 ENCOUNTER — Encounter (HOSPITAL_BASED_OUTPATIENT_CLINIC_OR_DEPARTMENT_OTHER): Payer: Self-pay | Admitting: Physical Therapy

## 2022-06-16 ENCOUNTER — Ambulatory Visit (HOSPITAL_BASED_OUTPATIENT_CLINIC_OR_DEPARTMENT_OTHER): Payer: Medicare Other | Admitting: Physical Therapy

## 2022-06-16 DIAGNOSIS — M25552 Pain in left hip: Secondary | ICD-10-CM | POA: Diagnosis not present

## 2022-06-16 DIAGNOSIS — M5459 Other low back pain: Secondary | ICD-10-CM

## 2022-06-16 DIAGNOSIS — M6281 Muscle weakness (generalized): Secondary | ICD-10-CM | POA: Diagnosis not present

## 2022-06-16 DIAGNOSIS — M546 Pain in thoracic spine: Secondary | ICD-10-CM

## 2022-06-16 DIAGNOSIS — M25632 Stiffness of left wrist, not elsewhere classified: Secondary | ICD-10-CM | POA: Diagnosis not present

## 2022-06-16 NOTE — Therapy (Addendum)
OUTPATIENT PHYSICAL THERAPY LOWER EXTREMITY TREATMENT PHYSICAL THERAPY DISCHARGE SUMMARY  Visits from Start of Care: 5  Current functional level related to goals / functional outcomes: unknown   Remaining deficits: all   Education / Equipment: Objective findings, POC Patient agrees to discharge. Patient goals were not met. Patient is being discharged due to not returning since the last visit.   Patient Name: Felicia Cain MRN: [id number] DOB:04-Dec-1955, 66 y.o., female Today's Date: 06/16/2022   PT End of Session - 06/16/22 0740     Visit Number 5    Number of Visits 12    Date for PT Re-Evaluation 07/08/22    Authorization Type Medicare    Progress Note Due on Visit 10    PT Start Time 0728    PT Stop Time 0810    PT Time Calculation (min) 42 min    Activity Tolerance Patient tolerated treatment well    Behavior During Therapy [facility] for tasks assessed/performed             Past Medical History:  Diagnosis Date   Anxiety    Arthritis    "back" (01/25/2017)   Brachial neuritis    neuropathy   Chronic lower back pain    DCIS (ductal carcinoma in situ)    "left side?"   Esophageal stricture    stricture with dysphasia   Fibromyalgia    GERD (gastroesophageal reflux disease)    H/O Doppler ultrasound 2006   see scanned study   H/O echocardiogram 2004, 2013   Hepatitis A 1961   [place]"   History of cardiac monitoring 2006   History of nuclear stress test 2004   see scanned study   Migraine    "in the past; cycle related" (01/25/2017)   Myasthenia gravis in crisis ([place]) 04/2014   respiratory crisis   NAION (non-arteritic anterior ischemic optic neuropathy), right    Near syncope    Neuromuscular disorder (HCC)    Neuropathy    Optic neuritis    ischaemic optic neuritis, non arteric   Optic neuropathy    PONV (postoperative nausea and vomiting)    Posterior optic neuritis    Ptosis of eyelid    Sleep apnea    "I wear Moses device; I  don't wear CPAP" (01/25/2017)   Spinal stenosis    Past Surgical History:  Procedure Laterality Date   AUGMENTATION MAMMAPLASTY     2000, after breast cancer surgery,redone 2010   BACK SURGERY     BREAST BIOPSY     BREAST CAPSULECTOMY WITH IMPLANT EXCHANGE Bilateral 09/28/2018   Procedure: REMOVAL OF BILATERAL BREAST IMPLANTS WITH CAPSULECTOMIES AND REPLACEMENTS OF IMPLANTS;  Surgeon: Wallace Going, DO;  Location: [place];  Service: Plastics;  Laterality: Bilateral;   CARPAL TUNNEL RELEASE Bilateral    CESAREAN SECTION  1989   ESOPHAGOGASTRODUODENOSCOPY (EGD) WITH ESOPHAGEAL DILATION     HIP PINNING,CANNULATED Right 01/26/2017   Procedure: CANNULATED SCREWS/RIGHT HIP PINNING;  Surgeon: Mcarthur Rossetti, MD;  Location: [place];  Service: Orthopedics;  Laterality: Right;   INNER EAR SURGERY Right 1995   LUMBAR LAMINECTOMY Right 1999   [name]   MASTECTOMY Bilateral    Patient Active Problem List   Diagnosis Date Noted   Pain in left elbow 10/21/2021   Pain in right elbow 10/21/2021   Tremor of right hand 07/23/2021   Titubation 07/23/2021   Weakness of both lower extremities 01/20/2021   Gait instability 01/20/2021  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 ([place]) 09/20/2019   Peroneal neuropathy at knee, left 03/19/2019   Patellar contusion, left, sequela 02/09/2019   Rib pain on right side 11/24/2018   Acquired absence of both breasts 09/20/2018   Ruptured right breast implant 08/26/2018   History of reconstruction of both breasts 07/08/2018   Breast implant capsular contracture 07/08/2018   Breast pain 07/08/2018   Retrognathia 09/15/2017   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   Status post-operative repair of closed fracture of right hip 09/08/2017   Acute pain of right knee 05/24/2017   Hip fracture ([place]) 01/25/2017   Closed hip fracture, right, initial encounter ([place])    Preop examination     Fibromyalgia    Neuropathy    Corneal abrasion    Nausea    Fall    Seronegative myasthenia gravis ([place]) 07/19/2015   Acute exacerbation of chronic low back pain 07/19/2015   Leukocytosis 07/19/2015   Myasthenia gravis in remission ([place]) 10/23/2014   OSA on CPAP 10/23/2014   Hypersomnia, persistent 10/23/2014   Hypoxemia 08/14/2014   Myasthenia gravis with exacerbation ([place]) 08/14/2014   Myasthenia gravis with exacerbation, ocular ([place]) 12/08/2012   Right shoulder pain 08/02/2012   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   History of optic neuritis 06/27/2012   Rosacea 06/27/2012   S/P laminectomy 06/27/2012   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   S/P bilateral mastectomy 06/26/2012   BRCA negative 06/26/2012   GERD (gastroesophageal reflux disease) 06/26/2012   Menopause 06/26/2012   Ankle pain 06/02/2012    PCP: Lajean Manes, MD  REFERRING PROVIDER: Gerda Diss, DO  REFERRING DIAG:  [phone] (ICD-10-CM) - Pain in left hip  M25.571 (ICD-10-CM) - Pain in right ankle and joints of right foot  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    PERTINENT HISTORY: Myasthenia Gravis, neuropathy, previous R hip fracture, RA, previous laminectomy, B mastectomy, B Carpal tunnel release  PAIN:  Are you having pain? Yes: NPRS scale: see below Pain location:  R wrist 12/10;  Pain description: ache, stiffness Aggravating factors: underdo or overdo. Relieving factors: meds  PRECAUTIONS: None  WEIGHT BEARING RESTRICTIONS No  FALLS:   Has patient fallen in last 6 months? No  LIVING ENVIRONMENT: Lives with: lives with their family Lives in: House/apartment Stairs: Yes: External: 3 steps; can reach both Has following equipment at home: None  OCCUPATION: disabled;  former Internal Medicine physician  PLOF: Independent  PATIENT GOALS decrease pain, more flexibility, improve toleration to exercising she completes on her own   OBJECTIVE:   DIAGNOSTIC FINDINGS: 10/2020 . L1-2: Newly seen mild disc bulge towards the left. No compressive stenosis. 2. L4-5: Disc bulge. Mild facet and ligamentous hypertrophy. Mild stenosis of both lateral recesses. Some potential either L5 nerve could be affected in this location. No visible change since 2018. 3. L5-S1: Previous right hemilaminectomy and discectomy. Chronic loss of disc height. Small endplate osteophytes. Mild bilateral foraminal narrowing, but without visible compression of the exiting L5 nerves. No visible change since 2018.  PATIENT SURVEYS:  FOTO 36 with goal of 14  COGNITION:  Overall cognitive status: Within functional limits for tasks assessed     SENSATION: WFL   MUSCLE LENGTH: Hamstrings: Right 90 deg; Left 90 deg   POSTURE: anterior pelvic tilt  PALPATION: Moderately TTP paraspinals cervical through lumbar  LOWER EXTREMITY ROM:  Active ROM Right eval Left eval  Hip flexion    Hip extension    Hip abduction    Hip adduction    Hip internal rotation    Hip external rotation    Knee flexion    Knee extension    Ankle dorsiflexion    Ankle plantarflexion    Ankle inversion    Ankle eversion     (Blank rows = not tested)  LOWER EXTREMITY MMT:  MMT Right eval Left eval  Hip flexion 3+ 4  Hip extension    Hip abduction    Hip adduction    Hip internal rotation    Hip external rotation    Knee flexion 4 5  Knee extension 4 5  Ankle dorsiflexion    Ankle plantarflexion    Ankle inversion    Ankle eversion     (Blank rows =  not tested)  Lumbar AROM: limited by pain side bending and rotation. Flex and ext full with pain from flexed position to upright Core str:4/5   SPECIAL TESTS:  SI compression distraction test neg SLR neg Slump neg  FUNCTIONAL TESTS:  5 times sit to stand: 19.47 Timed up and go (TUG): 1402 Berg Balance Scale: 35/56  GAIT: Distance walked: 500 ft Assistive device utilized: None Level of assistance: Complete Independence Comments: right antle inversion    TODAY'S TREATMENT: Pt seen for aquatic therapy today.  Treatment took place in water 3.25-4.5 ft in depth at the [place]. Temp of water was 92.  Pt entered/exited the pool via stairs independently with bilat rail.  Holding wall:  Weight shifts forward / backward, rolling through foot/toes Hip circles (straight knee) CW/ CCW;   alternating hurdle leg; hip abdct / add ; hip extension; front kicks (knee flex to ext) Curtsy's; high knee march With support of white barbell:  walking forward and backward;  sidestepping; marching forward;  braiding R/L On 4th step:  STS x 5 with arms reaching towards ceiling; improved control and form Attempts at back float with therapist directing/ supporting (holding rails; holding noodle)   Pt requires the buoyancy and hydrostatic pressure of water for support, and to offload joints by unweighting joint load by at least 50 % in navel deep water and by at least 75-80% in chest to neck deep water.  Viscosity of the water is needed for resistance of strengthening. Water current perturbations provides challenge to standing balance requiring increased core activation.  PATIENT EDUCATION:  Education details:  aquatic progressions Person educated: Patient Education method: Explanation Education comprehension: verbalized understanding   HOME EXERCISE PROGRAM: TBA  ASSESSMENT:  CLINICAL IMPRESSION: Pt's comfort level in water has increased; can complete exercises with supervision  of therapist on deck. No overt LOB today.  Overall, she reported no increase in pain during session.  Avoid pt in water up to shoulders/neck due to reported symptoms in heart/lungs.   She will benefit from aquatic therapy using properties of water to improve strength, flexibility and decrease pain sx to better manage all  functional impairments, decrease fall risk and improve quality of life.     OBJECTIVE IMPAIRMENTS decreased activity tolerance, decreased balance, difficulty walking, decreased  strength, and pain.   ACTIVITY LIMITATIONS carrying, lifting, bending, stairs, transfers, bed mobility, and caring for others  PARTICIPATION LIMITATIONS: cleaning, shopping, community activity, and yard work  PERSONAL FACTORS Age, Fitness, Past/current experiences, and 3+ comorbidities: psoriatic arthritis; Spondylolysis, lumbar spine, right ankle pain, pain left hip   are also affecting patient's functional outcome.   REHAB POTENTIAL: Good  CLINICAL DECISION MAKING: Unstable/unpredictable  EVALUATION COMPLEXITY: High   GOALS: Goals reviewed with patient? Yes  SHORT TERM GOALS: Target date: 06/17/22 Pt to tolerate full aquatic session without increase in pain or > 2 rest periods needed Baseline: Goal status: INITIAL  2.  Pt will rise from a seated position without UE use on knees Baseline: Rises pushing up from knees Goal status: INITIAL  3.  Pt will report getting out of bed consistently in less than 10 minutes Baseline: 20 Goal status: INITIAL    LONG TERM GOALS: Target date: 07/08/22  Pt will be able to get in and out of car without being limited by pain Baseline: needs extra time to complete car transfers Goal status: INITIAL  2.  Pt will be able to walk outdoor >1 hour Baseline: <1hour Goal status: INITIAL  3.  Improve quad strength to 5/5 to demonstrate improved strength Baseline:  Goal status: INITIAL  4.  Pt to reach FOTO stated goal of 55 to demonstrate improved  function Baseline: 36 Goal status: INITIAL  5.  Pt will improve on Berg to 45/56 to demonstrate decreased fall risk Baseline: 35/56 Goal status: INITIAL     PLAN: PT FREQUENCY: 1-2x/week  PT DURATION: 6 weeks  PLANNED INTERVENTIONS: Therapeutic exercises, Therapeutic activity, Neuromuscular re-education, Balance training, Gait training, Patient/Family education, Self Care, and Joint mobilization  PLAN FOR NEXT SESSION: aquatic for core and LE flexibility, strengthening.  Kerin Perna, PTA 06/16/22 1:51 PM [facility] [address] [place], [place], 03/30/1956[address] Phone: [phone]   Fax:  [phone]  Addended Annamarie Major) [place] MPT 07/29/22 405p

## 2022-06-17 DIAGNOSIS — M654 Radial styloid tenosynovitis [de Quervain]: Secondary | ICD-10-CM | POA: Diagnosis not present

## 2022-06-17 DIAGNOSIS — M79641 Pain in right hand: Secondary | ICD-10-CM | POA: Diagnosis not present

## 2022-06-18 ENCOUNTER — Ambulatory Visit (HOSPITAL_BASED_OUTPATIENT_CLINIC_OR_DEPARTMENT_OTHER): Payer: PRIVATE HEALTH INSURANCE | Admitting: Physical Therapy

## 2022-06-19 ENCOUNTER — Other Ambulatory Visit: Payer: PRIVATE HEALTH INSURANCE

## 2022-06-19 DIAGNOSIS — H40012 Open angle with borderline findings, low risk, left eye: Secondary | ICD-10-CM | POA: Diagnosis not present

## 2022-06-19 DIAGNOSIS — H30142 Acute posterior multifocal placoid pigment epitheliopathy, left eye: Secondary | ICD-10-CM | POA: Diagnosis not present

## 2022-06-19 DIAGNOSIS — H11823 Conjunctivochalasis, bilateral: Secondary | ICD-10-CM | POA: Diagnosis not present

## 2022-06-19 DIAGNOSIS — G7000 Myasthenia gravis without (acute) exacerbation: Secondary | ICD-10-CM | POA: Diagnosis not present

## 2022-06-19 DIAGNOSIS — H47291 Other optic atrophy, right eye: Secondary | ICD-10-CM | POA: Diagnosis not present

## 2022-06-22 ENCOUNTER — Ambulatory Visit
Admission: RE | Admit: 2022-06-22 | Discharge: 2022-06-22 | Disposition: A | Discharge status: Home / Self Care | Payer: Medicare Other | Source: Ambulatory Visit | Attending: Gastroenterology | Admitting: Gastroenterology

## 2022-06-22 DIAGNOSIS — M9906 Segmental and somatic dysfunction of lower extremity: Secondary | ICD-10-CM | POA: Diagnosis not present

## 2022-06-22 DIAGNOSIS — R1310 Dysphagia, unspecified: Secondary | ICD-10-CM

## 2022-06-22 DIAGNOSIS — M9905 Segmental and somatic dysfunction of pelvic region: Secondary | ICD-10-CM | POA: Diagnosis not present

## 2022-06-22 DIAGNOSIS — M79652 Pain in left thigh: Secondary | ICD-10-CM | POA: Diagnosis not present

## 2022-06-22 DIAGNOSIS — M62838 Other muscle spasm: Secondary | ICD-10-CM | POA: Diagnosis not present

## 2022-06-22 DIAGNOSIS — R2681 Unsteadiness on feet: Secondary | ICD-10-CM | POA: Diagnosis not present

## 2022-06-22 DIAGNOSIS — M79605 Pain in left leg: Secondary | ICD-10-CM | POA: Diagnosis not present

## 2022-06-22 DIAGNOSIS — G729 Myopathy, unspecified: Secondary | ICD-10-CM | POA: Diagnosis not present

## 2022-06-22 DIAGNOSIS — M9904 Segmental and somatic dysfunction of sacral region: Secondary | ICD-10-CM | POA: Diagnosis not present

## 2022-06-23 ENCOUNTER — Ambulatory Visit (HOSPITAL_BASED_OUTPATIENT_CLINIC_OR_DEPARTMENT_OTHER): Payer: PRIVATE HEALTH INSURANCE | Admitting: Physical Therapy

## 2022-06-23 DIAGNOSIS — R2681 Unsteadiness on feet: Secondary | ICD-10-CM | POA: Diagnosis not present

## 2022-06-23 DIAGNOSIS — M79652 Pain in left thigh: Secondary | ICD-10-CM | POA: Diagnosis not present

## 2022-06-23 DIAGNOSIS — G729 Myopathy, unspecified: Secondary | ICD-10-CM | POA: Diagnosis not present

## 2022-06-23 DIAGNOSIS — M62838 Other muscle spasm: Secondary | ICD-10-CM | POA: Diagnosis not present

## 2022-06-23 DIAGNOSIS — M79605 Pain in left leg: Secondary | ICD-10-CM | POA: Diagnosis not present

## 2022-06-24 ENCOUNTER — Ambulatory Visit: Payer: Medicare Other | Attending: Psychiatry | Admitting: Physical Therapy

## 2022-06-24 ENCOUNTER — Other Ambulatory Visit: Payer: Self-pay

## 2022-06-24 DIAGNOSIS — R2681 Unsteadiness on feet: Secondary | ICD-10-CM | POA: Diagnosis not present

## 2022-06-24 DIAGNOSIS — M6281 Muscle weakness (generalized): Secondary | ICD-10-CM | POA: Insufficient documentation

## 2022-06-24 DIAGNOSIS — R2689 Other abnormalities of gait and mobility: Secondary | ICD-10-CM | POA: Diagnosis not present

## 2022-06-24 NOTE — Therapy (Signed)
OUTPATIENT PHYSICAL THERAPY NEURO EVALUATION   Patient Name: Felicia Cain MRN: [id number] DOB:[birth year], 66 y.o., female Today's Date: 06/24/2022   PCP: Lajean Manes, MD REFERRING PROVIDER: Lezlie Octave, MD   PT End of Session - 06/24/22 1638     Visit Number 1   *VISIT 1 at [facility], new episode with Dr. Mervyn Skeeters referral   Number of Visits 13    Date for PT Re-Evaluation 08/07/22    Authorization Type Medicare    Authorization - Visit Number 6   5 previously used at [place] upon [facility] eval 06/24/2022   Progress Note Due on Visit 10    PT Start Time 1447    PT Stop Time 1537    PT Time Calculation (min) 50 min    Activity Tolerance Patient tolerated treatment well    Behavior During Therapy [facility] for tasks assessed/performed             Past Medical History:  Diagnosis Date   Anxiety    Arthritis    "back" (01/25/2017)   Brachial neuritis    neuropathy   Chronic lower back pain    DCIS (ductal carcinoma in situ)    "left side?"   Esophageal stricture    stricture with dysphasia   Fibromyalgia    GERD (gastroesophageal reflux disease)    H/O Doppler ultrasound 2006   see scanned study   H/O echocardiogram 2004, 2013   Hepatitis A 1961   "epidemic in my city"   History of cardiac monitoring 2006   History of nuclear stress test 2004   see scanned study   Migraine    "in the past; cycle related" (01/25/2017)   Myasthenia gravis in crisis ([place]) 04/2014   respiratory crisis   NAION (non-arteritic anterior ischemic optic neuropathy), right    Near syncope    Neuromuscular disorder (HCC)    Neuropathy    Optic neuritis    ischaemic optic neuritis, non arteric   Optic neuropathy    PONV (postoperative nausea and vomiting)    Posterior optic neuritis    Ptosis of eyelid    Sleep apnea    "I wear Moses device; I don't wear CPAP" (01/25/2017)   Spinal stenosis    Past Surgical History:  Procedure Laterality Date   AUGMENTATION  MAMMAPLASTY     2000, after breast cancer surgery,redone 2010   BACK SURGERY     BREAST BIOPSY     BREAST CAPSULECTOMY WITH IMPLANT EXCHANGE Bilateral 09/28/2018   Procedure: REMOVAL OF BILATERAL BREAST IMPLANTS WITH CAPSULECTOMIES AND REPLACEMENTS OF IMPLANTS;  Surgeon: Wallace Going, DO;  Location: [facility];  Service: Plastics;  Laterality: Bilateral;   CARPAL TUNNEL RELEASE Bilateral    CESAREAN SECTION  1989   ESOPHAGOGASTRODUODENOSCOPY (EGD) WITH ESOPHAGEAL DILATION     HIP PINNING,CANNULATED Right 01/26/2017   Procedure: CANNULATED SCREWS/RIGHT HIP PINNING;  Surgeon: Mcarthur Rossetti, MD;  Location: [facility];  Service: Orthopedics;  Laterality: Right;   INNER EAR SURGERY Right 1995   LUMBAR LAMINECTOMY Right 1999   [name]   MASTECTOMY Bilateral    Patient Active Problem List   Diagnosis Date Noted   Pain in left elbow 10/21/2021   Pain in right elbow 10/21/2021   Tremor of right hand 07/23/2021   Titubation 07/23/2021   Weakness of both lower extremities 01/20/2021   Gait instability 01/20/2021   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 ([place]) 09/20/2019   Peroneal neuropathy  at knee, left 03/19/2019   Patellar contusion, left, sequela 02/09/2019   Rib pain on right side 11/24/2018   Acquired absence of both breasts 09/20/2018   Ruptured right breast implant 08/26/2018   History of reconstruction of both breasts 07/08/2018   Breast implant capsular contracture 07/08/2018   Breast pain 07/08/2018   Retrognathia 09/15/2017   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   Status post-operative repair of closed fracture of right hip 09/08/2017   Acute pain of right knee 05/24/2017   Hip fracture ([place]) 01/25/2017   Closed hip fracture, right, initial encounter ([place])    Preop examination    Fibromyalgia    Neuropathy    Corneal abrasion    Nausea    Fall    Seronegative myasthenia gravis ([place]) 07/19/2015   Acute  exacerbation of chronic low back pain 07/19/2015   Leukocytosis 07/19/2015   Myasthenia gravis in remission ([place]) 10/23/2014   OSA on CPAP 10/23/2014   Hypersomnia, persistent 10/23/2014   Hypoxemia 08/14/2014   Myasthenia gravis with exacerbation ([place]) 08/14/2014   Myasthenia gravis with exacerbation, ocular ([place]) 12/08/2012   Right shoulder pain 08/02/2012   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   History of optic neuritis 06/27/2012   Rosacea 06/27/2012   S/P laminectomy 06/27/2012   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   S/P bilateral mastectomy 06/26/2012   BRCA negative 06/26/2012   GERD (gastroesophageal reflux disease) 06/26/2012   Menopause 06/26/2012   Ankle pain 06/02/2012    ONSET DATE: 06/17/2022 (MD referral)  REFERRING DIAG: G20.A1 (ICD-10-CM) - Parkinson's disease without dyskinesia, without mention of fluctuations  THERAPY DIAG:  Other abnormalities of gait and mobility  Unsteadiness on feet  Muscle weakness (generalized)  Rationale for Evaluation and Treatment Rehabilitation  SUBJECTIVE:                                                                                                                                                                                              SUBJECTIVE STATEMENT: Was working in the pool and it was helping with the back, leg pain, stiffness.  Was having symptoms many years-getting worse-tremors in R side.  Saw Dr. Mervyn Skeeters in May.  Having more instability, stiffness type pain, feeling weaker than at that visit.  The aquatics therapy was helping and that was very helpful.  Have to hold on that for now, due to R wrist.  Reports almost trips over R foot at times.  Also, RLE has cramping and fasiculation/tremors with fatigue. Pt accompanied by: self  PERTINENT HISTORY: Parkinson's dx 01/2022 (not on medication); PMH includes cancer, Myasthenia gravis, lumbar lami/discectomy.  Dequervain's R hand, psoriatic arthritis, R hip  surgery due to hip fracture  PAIN:  Are you having pain? Yes: NPRS scale: 8-9/10 Pain location: R foot/buttock and hip area (3-4/10) Pain description: soreness, stiffness Aggravating factors: stretching too much, varies daily Relieving factors: aquatics were helpful, ice/heat  PRECAUTIONS: Fall and Other: wearing R wrist brace after injections (followed by Dr. Fredna Dow)  WEIGHT BEARING RESTRICTIONS No  FALLS: Has patient fallen in last 6 months? No and near fall in late September  LIVING ENVIRONMENT: Lives with: lives with their family and lives with mother Lives in: House/apartment Stairs: Yes: External: 3 steps; on right going up Has following equipment at home: Single point cane and Walker - 2 wheeled  PLOF: Independent and Vocation/Vocational requirements: part-time job; does yoga and other regular exercise  PATIENT GOALS Pt's goals are to not trip over my foot and to lessen the spasms in L upper thigh  OBJECTIVE:   DIAGNOSTIC FINDINGS: NA at this time-awaiting brain MRI and DaT scan  COGNITION: Overall cognitive status: Within functional limits for tasks assessed   SENSATION: WFL to light touch   MUSCLE TONE: WFL to passive ROM BLEs   POSTURE: No Significant postural limitations  LOWER EXTREMITY ROM:   WFL, however some limitations with twitching motion RLE with attempts at full knee extension  Active  Right Eval Left Eval  Hip flexion    Hip extension    Hip abduction    Hip adduction    Hip internal rotation    Hip external rotation    Knee flexion    Knee extension    Ankle dorsiflexion    Ankle plantarflexion    Ankle inversion    Ankle eversion     (Blank rows = not tested)  LOWER EXTREMITY MMT:    MMT Right Eval Left Eval  Hip flexion 4 5  Hip extension    Hip abduction    Hip adduction    Hip internal rotation    Hip external rotation    Knee flexion 4+ 5  Knee extension 4 5  Ankle dorsiflexion 5 5  Ankle plantarflexion    Ankle  inversion    Ankle eversion    (Blank rows = not tested)   TRANSFERS: Assistive device utilized: None  Sit to stand: SBA  Pt has hands on knees and has increased forward lean, increased time to push up to stand Stand to sit: SBA  GAIT: Gait pattern:  fasiculations, spasm/tremor in RLE with fatigue, step through pattern, decreased arm swing- Right, decreased arm swing- Left, decreased step length- Right, and poor foot clearance- Right Distance walked: 40 ft x 4 reps Assistive device utilized: None Level of assistance: Modified independence and SBA Comments: Pt c/o some pain in R hip  FUNCTIONAL TESTs:  5 times sit to stand: 39.97 sec starting with hands at chest, then hands on knees Timed up and go (TUG): 17.31 sec TUG cognitive:  13.96 sec MiniBESTest:  16/28 (scores <22/28 indicate increased fall risk)  [facility] PT Assessment - 06/24/22 0001       Standardized Balance Assessment   Standardized Balance Assessment Mini-BESTest      Mini-BESTest   Sit To Stand Moderate: Comes to stand WITH use of hands on first attempt.    Rise to Toes < 3 s.    Stand on one leg (left) Moderate: < 20 s   2.91   Stand on one leg (right) Moderate: < 20 s   1 sec  Stand on one leg - lowest score 1    Compensatory Stepping Correction - Forward Moderate: More than one step is required to recover equilibrium    Compensatory Stepping Correction - Backward No step, OR would fall if not caught, OR falls spontaneously.    Compensatory Stepping Correction - Left Lateral Normal: Recovers independently with 1 step (crossover or lateral OK)    Compensatory Stepping Correction - Right Lateral Normal: Recovers independently with 1 step (crossover or lateral OK)    Stepping Corredtion Lateral - lowest score 2    Stance - Feet together, eyes open, firm surface  Normal: 30s    Stance - Feet together, eyes closed, foam surface  Severe: Unable    Incline - Eyes Closed Normal: Stands independently 30s and aligns with  gravity    Change in Gait Speed Moderate: Unable to change walking speed or signs of imbalance    Walk with head turns - Horizontal Normal: performs head turns with no change in gait speed and good balance    Walk with pivot turns Moderate:Turns with feet close SLOW (>4 steps) with good balance.    Step over obstacles Moderate: Steps over box but touches box OR displays cautious behavior by slowing gait.    Timed UP & GO with Dual Task Normal: No noticeable change in sitting, standing or walking while backward counting when compared to TUG without    Mini-BEST total score 16               PATIENT EDUCATION: Education details: Eval results, POC Person educated: Patient Education method: Explanation Education comprehension: verbalized understanding   HOME EXERCISE PROGRAM: Not yet initiated    GOALS: Goals reviewed with patient? Yes  SHORT TERM GOALS: Target date: 07/24/2022  Pt will be independent with HEP for improved strength, balance, gait. Baseline: Goal status: INITIAL  2.  Pt will improve 5x sit<>stand to less than or equal to 30 sec to demonstrate improved functional strength and transfer efficiency. Baseline: 39.97 sec Goal status: INITIAL  3.  Pt will perform posterior push and release and anterior push and release test in 2 steps or less for improved balance recovery. Baseline: multiple steps, would fall if unaided in posterior direction Goal status: INITIAL  LONG TERM GOALS: Target date: 08/07/2022  Pt will be independent with HEP for improved strength, balance, transfers, and gait. Baseline:  Goal status: INITIAL  2.  Pt will improve 5x sit<>stand to less than or equal to 20 sec to demonstrate improved functional strength and transfer efficiency. Baseline: 39.97 sec Goal status: INITIAL  3.  Pt will improve TUG score to less than or equal to 13.5  sec for decreased fall risk. Baseline: 17.31 Goal status: INITIAL  4.  Pt will improve MiniBESTest  score to at least 22/28 to decrease fall risk. Baseline: 16/28 Goal status: INITIAL  5.  Pt will verbalize understanding of local Parkinson's disease community resources.  Baseline:  Goal status: INITIAL    ASSESSMENT:  CLINICAL IMPRESSION: Patient is a 66 y.o. female who was seen today for physical therapy evaluation and treatment for new dx of Parkinson's disease.   Pt was diagnosed in 01/2022, but upon seeing Dr. Mervyn Skeeters in October 2023, she feels she has had slower movement patterns, more stiffness, more difficulty with advancement of RLE with gait.  She has not had any falls, but did have one near fall.  She is at increased fall risk per FTSTS, TUG and MiniBESTest scores.  She does  have decreased timing and coordination of RLE with transfers and with gait.  She requires multiple steps to recover in forward push and release and she would fall if unaided in posterior push and release test.  She would benefit from skilled PT to address the above stated deficits for improved overall functional mobility and decreased fall risk.   OBJECTIVE IMPAIRMENTS Abnormal gait, decreased balance, decreased mobility, difficulty walking, and decreased strength.   ACTIVITY LIMITATIONS standing, transfers, and locomotion level  PARTICIPATION LIMITATIONS: community activity  PERSONAL FACTORS 3+ comorbidities: see above for PMH, but complex orthopedic history and pain  are also affecting patient's functional outcome. Also, pt has further brain scan, blood work testing to be completed.  REHAB POTENTIAL: Good  CLINICAL DECISION MAKING: Evolving/moderate complexity  EVALUATION COMPLEXITY: Moderate  PLAN: PT FREQUENCY: 2x/week  PT DURATION: 6 weeks plus eval  PLANNED INTERVENTIONS: Therapeutic exercises, Therapeutic activity, Neuromuscular re-education, Balance training, Gait training, Patient/Family education, and Self Care  PLAN FOR NEXT SESSION: Initiate balance exercises to address hip, ankle, step  strategy work, sit<>stand transfers from elevated surfaces for improved lower extremity strength   Lagena Strand W., PT 06/24/2022, 4:44 PM   [facility] at [facility] [address] [place], [place] [address] Phone # [phone] Fax # [phone]

## 2022-06-25 DIAGNOSIS — Z23 Encounter for immunization: Secondary | ICD-10-CM | POA: Diagnosis not present

## 2022-06-26 ENCOUNTER — Ambulatory Visit (HOSPITAL_BASED_OUTPATIENT_CLINIC_OR_DEPARTMENT_OTHER): Payer: PRIVATE HEALTH INSURANCE | Admitting: Physical Therapy

## 2022-06-30 ENCOUNTER — Ambulatory Visit (HOSPITAL_BASED_OUTPATIENT_CLINIC_OR_DEPARTMENT_OTHER): Payer: PRIVATE HEALTH INSURANCE | Admitting: Physical Therapy

## 2022-06-30 ENCOUNTER — Encounter: Payer: Self-pay | Admitting: Physical Therapy

## 2022-06-30 ENCOUNTER — Ambulatory Visit: Payer: Medicare Other | Admitting: Physical Therapy

## 2022-06-30 DIAGNOSIS — R2689 Other abnormalities of gait and mobility: Secondary | ICD-10-CM

## 2022-06-30 DIAGNOSIS — R2681 Unsteadiness on feet: Secondary | ICD-10-CM | POA: Diagnosis not present

## 2022-06-30 DIAGNOSIS — M6281 Muscle weakness (generalized): Secondary | ICD-10-CM | POA: Diagnosis not present

## 2022-06-30 NOTE — Therapy (Signed)
OUTPATIENT PHYSICAL THERAPY NEURO TREATMENT NOTE   Patient Name: Felicia Cain MRN: [id number] DOB:[birth year], 66 y.o., female Today's Date: 06/30/2022   PCP: Lajean Manes, MD REFERRING PROVIDER: Lezlie Octave, MD   PT End of Session - 06/30/22 1535     Visit Number 2    Number of Visits 13    Date for PT Re-Evaluation 08/07/22    Authorization Type Medicare    Authorization - Visit Number 6   5 previously used at [place] [facility] [place] [facility] eval 06/24/2022   Progress Note Due on Visit 10    PT Start Time 9563    PT Stop Time 8756    PT Time Calculation (min) 40 min    Activity Tolerance Patient tolerated treatment well    Behavior During Therapy [facility] for tasks assessed/performed              Past Medical History:  Diagnosis Date   Anxiety    Arthritis    "back" (01/25/2017)   Brachial neuritis    neuropathy   Chronic lower back pain    DCIS (ductal carcinoma in situ)    "left side?"   Esophageal stricture    stricture with dysphasia   Fibromyalgia    GERD (gastroesophageal reflux disease)    H/O Doppler ultrasound 2006   see scanned study   H/O echocardiogram 2004, 2013   Hepatitis A 1961   "epidemic in my city"   History of cardiac monitoring 2006   History of nuclear stress test 2004   see scanned study   Migraine    "in the past; cycle related" (01/25/2017)   Myasthenia gravis in crisis ([place]) 04/2014   respiratory crisis   NAION (non-arteritic anterior ischemic optic neuropathy), right    Near syncope    Neuromuscular disorder (HCC)    Neuropathy    Optic neuritis    ischaemic optic neuritis, non arteric   Optic neuropathy    PONV (postoperative nausea and vomiting)    Posterior optic neuritis    Ptosis of eyelid    Sleep apnea    "I wear Moses device; I don't wear CPAP" (01/25/2017)   Spinal stenosis    Past Surgical History:  Procedure Laterality Date   AUGMENTATION MAMMAPLASTY     2000, after breast cancer surgery,redone 2010    BACK SURGERY     BREAST BIOPSY     BREAST CAPSULECTOMY WITH IMPLANT EXCHANGE Bilateral 09/28/2018   Procedure: REMOVAL OF BILATERAL BREAST IMPLANTS WITH CAPSULECTOMIES AND REPLACEMENTS OF IMPLANTS;  Surgeon: Wallace Going, DO;  Location: [place];  Service: Plastics;  Laterality: Bilateral;   CARPAL TUNNEL RELEASE Bilateral    CESAREAN SECTION  1989   ESOPHAGOGASTRODUODENOSCOPY (EGD) WITH ESOPHAGEAL DILATION     HIP PINNING,CANNULATED Right 01/26/2017   Procedure: CANNULATED SCREWS/RIGHT HIP PINNING;  Surgeon: Mcarthur Rossetti, MD;  Location: [place];  Service: Orthopedics;  Laterality: Right;   INNER EAR SURGERY Right 1995   LUMBAR LAMINECTOMY Right 1999   [name]   MASTECTOMY Bilateral    Patient Active Problem List   Diagnosis Date Noted   Pain in left elbow 10/21/2021   Pain in right elbow 10/21/2021   Tremor of right hand 07/23/2021   Titubation 07/23/2021   Weakness of both lower extremities 01/20/2021   Gait instability 01/20/2021   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 ([place]) 09/20/2019   Peroneal neuropathy at knee, left 03/19/2019   Patellar contusion, left, sequela  02/09/2019   Rib pain on right side 11/24/2018   Acquired absence of both breasts 09/20/2018   Ruptured right breast implant 08/26/2018   History of reconstruction of both breasts 07/08/2018   Breast implant capsular contracture 07/08/2018   Breast pain 07/08/2018   Retrognathia 09/15/2017   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   Status post-operative repair of closed fracture of right hip 09/08/2017   Acute pain of right knee 05/24/2017   Hip fracture ([place]) 01/25/2017   Closed hip fracture, right, initial encounter ([place])    Preop examination    Fibromyalgia    Neuropathy    Corneal abrasion    Nausea    Fall    Seronegative myasthenia gravis ([place]) 07/19/2015   Acute exacerbation of chronic low back pain 07/19/2015   Leukocytosis  07/19/2015   Myasthenia gravis in remission ([place]) 10/23/2014   OSA on CPAP 10/23/2014   Hypersomnia, persistent 10/23/2014   Hypoxemia 08/14/2014   Myasthenia gravis with exacerbation ([place]) 08/14/2014   Myasthenia gravis with exacerbation, ocular ([place]) 12/08/2012   Right shoulder pain 08/02/2012   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   History of optic neuritis 06/27/2012   Rosacea 06/27/2012   S/P laminectomy 06/27/2012   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   S/P bilateral mastectomy 06/26/2012   BRCA negative 06/26/2012   GERD (gastroesophageal reflux disease) 06/26/2012   Menopause 06/26/2012   Ankle pain 06/02/2012    ONSET DATE: 06/17/2022 (MD referral)  REFERRING DIAG: G20.A1 (ICD-10-CM) - Parkinson's disease without dyskinesia, without mention of fluctuations  THERAPY DIAG:  Other abnormalities of gait and mobility  Unsteadiness on feet  Muscle weakness (generalized)  Rationale for Evaluation and Treatment Rehabilitation  SUBJECTIVE:                                                                                                                                                                                              SUBJECTIVE STATEMENT: No pain today.  Had some severe pain after a walk on Friday.  Started to fall and caught myself but rolled my ankle.  Feeling better now, but wonder if I have plantar fasciitis in R foot. Pt accompanied by: self  PERTINENT HISTORY: Parkinson's dx 01/2022 (not on medication); PMH includes cancer, Myasthenia gravis, lumbar lami/discectomy.  Dequervain's R hand, psoriatic arthritis, R hip surgery due to hip fracture  PAIN:  Are you having pain? No pain  PRECAUTIONS: Fall and Other: wearing R wrist brace after injections (followed by Dr. Fredna Dow)  WEIGHT BEARING RESTRICTIONS No  FALLS: Has patient fallen in last 6 months? No and near fall in late  September  LIVING ENVIRONMENT: Lives with: lives with their family and lives  with mother Lives in: House/apartment Stairs: Yes: External: 3 steps; on right going up Has following equipment at home: Single point cane and Walker - 2 wheeled  PLOF: Independent and Vocation/Vocational requirements: part-time job; does yoga and other regular exercise  PATIENT GOALS Pt's goals are to not trip over my foot and to lessen the spasms in L upper thigh  OBJECTIVE:   TODAY'S TREATMENT: 06/30/2022 Activity Comments  Standing hamstring stretch-wide BOS posterior rock/anterior rock for gastroc Pt feels hamstring stretch, not gastroc  Standing gastroc stretch, with foot propped at 4" block, feels stretch more with R knee flex Reports she does this at home  Seated hamstring stretch, foot propped on floor, 3 x 30 sec Cues for technique  Heel/toe raises x 10 For ankle strategy work  Stagger stance rock forward/back x 10 reps  For hip/ankle strategy work, cues for technique for weightshifting  Wall bumps x 10 Cues for technique-for hip strategy work  Investment banker, corporate step strategy, over obstacle, then return to midline More difficulty with anterior weight excursion and return to midline smoothly through RLE  Back step strategy, then return to midline Cues for technique  Forward<>back step and weightshift x 10 reps each side Difficulty sequencing posterior direction     Access Code: [email or web address] URL: [email or web address] Date: 06/30/2022 Prepared by: [facility]  Program Notes Wall bumps:  Stand with your back about 4-6 inches from a wall.  Stick your bottom out to touch the wall (your head will come forward), then bring your hips forward to your start position.   Repeat  Exercises - Standing Lumbar Spine Flexion Stretch Counter  - 1-2 x daily - 7 x weekly - 1 sets - 3 reps - 15 sec hold - Seated Hamstring Stretch  - 1-2 x daily - 7 x weekly - 1 sets - 3 reps - 30 sec hold - Staggered Stance Forward Backward Weight Shift with Counter Support   - 1 x daily - 7 x weekly - 2 sets - 10 reps - Alternating Step forward-Balance Reaction  - 1 x daily - 5 x weekly - 2 sets - 10 reps - Alternating Step Backward with Support  - 1 x daily - 5 x weekly - 2 sets - 10 reps  PATIENT EDUCATION: Education details: HEP, rationale for balance strategy exercises; Parkinson's community resources Person educated: Patient Education method: Consulting civil engineer, Media planner, and Handouts Education comprehension: verbalized understanding, returned demonstration, and needs further education  ------------------------------------------------------------------------------ (Measures below from evaluation findings): DIAGNOSTIC FINDINGS: NA at this time-awaiting brain MRI and DaT scan  COGNITION: Overall cognitive status: Within functional limits for tasks assessed   SENSATION: WFL to light touch   MUSCLE TONE: WFL to passive ROM BLEs   POSTURE: No Significant postural limitations  LOWER EXTREMITY ROM:   WFL, however some limitations with twitching motion RLE with attempts at full knee extension  Active  Right Eval Left Eval  Hip flexion    Hip extension    Hip abduction    Hip adduction    Hip internal rotation    Hip external rotation    Knee flexion    Knee extension    Ankle dorsiflexion    Ankle plantarflexion    Ankle inversion    Ankle eversion     (Blank rows = not tested)  LOWER EXTREMITY MMT:    MMT Right Eval Left Eval  Hip flexion 4 5  Hip extension    Hip abduction    Hip adduction    Hip internal rotation    Hip external rotation    Knee flexion 4+ 5  Knee extension 4 5  Ankle dorsiflexion 5 5  Ankle plantarflexion    Ankle inversion    Ankle eversion    (Blank rows = not tested)   TRANSFERS: Assistive device utilized: None  Sit to stand: SBA  Pt has hands on knees and has increased forward lean, increased time to push up to stand Stand to sit: SBA  GAIT: Gait pattern:  fasiculations, spasm/tremor in RLE with  fatigue, step through pattern, decreased arm swing- Right, decreased arm swing- Left, decreased step length- Right, and poor foot clearance- Right Distance walked: 40 ft x 4 reps Assistive device utilized: None Level of assistance: Modified independence and SBA Comments: Pt c/o some pain in R hip  FUNCTIONAL TESTs:  5 times sit to stand: 39.97 sec starting with hands at chest, then hands on knees Timed up and go (TUG): 17.31 sec TUG cognitive:  13.96 sec MiniBESTest:  16/28 (scores <22/28 indicate increased fall risk)      PATIENT EDUCATION: Education details: Eval results, POC Person educated: Patient Education method: Explanation Education comprehension: verbalized understanding   HOME EXERCISE PROGRAM: Not yet initiated    GOALS: Goals reviewed with patient? Yes  SHORT TERM GOALS: Target date: 07/24/2022  Pt will be independent with HEP for improved strength, balance, gait. Baseline: Goal status: INITIAL  2.  Pt will improve 5x sit<>stand to less than or equal to 30 sec to demonstrate improved functional strength and transfer efficiency. Baseline: 39.97 sec Goal status: INITIAL  3.  Pt will perform posterior push and release and anterior push and release test in 2 steps or less for improved balance recovery. Baseline: multiple steps, would fall if unaided in posterior direction Goal status: INITIAL  LONG TERM GOALS: Target date: 08/07/2022  Pt will be independent with HEP for improved strength, balance, transfers, and gait. Baseline:  Goal status: INITIAL  2.  Pt will improve 5x sit<>stand to less than or equal to 20 sec to demonstrate improved functional strength and transfer efficiency. Baseline: 39.97 sec Goal status: INITIAL  3.  Pt will improve TUG score to less than or equal to 13.5  sec for decreased fall risk. Baseline: 17.31 Goal status: INITIAL  4.  Pt will improve MiniBESTest score to at least 22/28 to decrease fall risk. Baseline: 16/28 Goal  status: INITIAL  5.  Pt will verbalize understanding of local Parkinson's disease community resources.  Baseline:  Goal status: INITIAL    ASSESSMENT:  CLINICAL IMPRESSION: Initiated HEP this visit to address ankle, hip, step strategy work.  Pt reports having near-fall this past Friday, where R foot rolled and she had to reach out to catch balance.  Extra time and cueing needed for technique with ankle, hip, step strategy work, particularly in posterior directions.  She will continue to benefit from skilled PT towards goals for improved overall functional mobility and decreased fall risk.   OBJECTIVE IMPAIRMENTS Abnormal gait, decreased balance, decreased mobility, difficulty walking, and decreased strength.   ACTIVITY LIMITATIONS standing, transfers, and locomotion level  PARTICIPATION LIMITATIONS: community activity  PERSONAL FACTORS 3+ comorbidities: see above for PMH, but complex orthopedic history and pain  are also affecting patient's functional outcome. Also, pt has further brain scan, blood work testing to be completed.  REHAB POTENTIAL: Good  CLINICAL DECISION  MAKING: Evolving/moderate complexity  EVALUATION COMPLEXITY: Moderate  PLAN: PT FREQUENCY: 2x/week  PT DURATION: 6 weeks plus eval  PLANNED INTERVENTIONS: Therapeutic exercises, Therapeutic activity, Neuromuscular re-education, Balance training, Gait training, Patient/Family education, and Self Care  PLAN FOR NEXT SESSION: Review initial balance exercises to address hip, ankle, step strategy work and progress as able, sit<>stand transfers from elevated surfaces for improved lower extremity strength; compliant surfaces   Tanner Yeley W., PT 06/30/2022, 5:20 PM   [facility] [address] [place], [place] [address] Phone # [phone] Fax # [phone]

## 2022-07-02 ENCOUNTER — Ambulatory Visit: Payer: Medicare Other | Admitting: Physical Therapy

## 2022-07-02 ENCOUNTER — Encounter: Payer: Self-pay | Admitting: Physical Therapy

## 2022-07-02 DIAGNOSIS — R2689 Other abnormalities of gait and mobility: Secondary | ICD-10-CM | POA: Diagnosis not present

## 2022-07-02 DIAGNOSIS — R2681 Unsteadiness on feet: Secondary | ICD-10-CM | POA: Diagnosis not present

## 2022-07-02 DIAGNOSIS — M6281 Muscle weakness (generalized): Secondary | ICD-10-CM | POA: Diagnosis not present

## 2022-07-02 NOTE — Therapy (Signed)
OUTPATIENT PHYSICAL THERAPY NEURO TREATMENT NOTE   Patient Name: Felicia Cain MRN: [id number] DOB:06-Nov-1955, 66 y.o., female Today's Date: 07/02/2022   PCP: Lajean Manes, MD REFERRING PROVIDER: Lezlie Octave, MD   PT End of Session - 07/02/22 0848     Visit Number 3    Number of Visits 13    Date for PT Re-Evaluation 08/07/22    Authorization Type Medicare    Authorization - Visit Number 7   5 previously used at [place] upon [facility] eval 06/24/2022   Progress Note Due on Visit 10    PT Start Time 0849    PT Stop Time 0927    PT Time Calculation (min) 38 min    Activity Tolerance Patient tolerated treatment well    Behavior During Therapy [facility] for tasks assessed/performed               Past Medical History:  Diagnosis Date   Anxiety    Arthritis    "back" (01/25/2017)   Brachial neuritis    neuropathy   Chronic lower back pain    DCIS (ductal carcinoma in situ)    "left side?"   Esophageal stricture    stricture with dysphasia   Fibromyalgia    GERD (gastroesophageal reflux disease)    H/O Doppler ultrasound 2006   see scanned study   H/O echocardiogram 2004, 2013   Hepatitis A 1961   "epidemic in my city"   History of cardiac monitoring 2006   History of nuclear stress test 2004   see scanned study   Migraine    "in the past; cycle related" (01/25/2017)   Myasthenia gravis in crisis ([place]) 04/2014   respiratory crisis   NAION (non-arteritic anterior ischemic optic neuropathy), right    Near syncope    Neuromuscular disorder (HCC)    Neuropathy    Optic neuritis    ischaemic optic neuritis, non arteric   Optic neuropathy    PONV (postoperative nausea and vomiting)    Posterior optic neuritis    Ptosis of eyelid    Sleep apnea    "I wear Moses device; I don't wear CPAP" (01/25/2017)   Spinal stenosis    Past Surgical History:  Procedure Laterality Date   AUGMENTATION MAMMAPLASTY     2000, after breast cancer surgery,redone 2010    BACK SURGERY     BREAST BIOPSY     BREAST CAPSULECTOMY WITH IMPLANT EXCHANGE Bilateral 09/28/2018   Procedure: REMOVAL OF BILATERAL BREAST IMPLANTS WITH CAPSULECTOMIES AND REPLACEMENTS OF IMPLANTS;  Surgeon: Wallace Going, DO;  Location: [facility];  Service: Plastics;  Laterality: Bilateral;   CARPAL TUNNEL RELEASE Bilateral    CESAREAN SECTION  1989   ESOPHAGOGASTRODUODENOSCOPY (EGD) WITH ESOPHAGEAL DILATION     HIP PINNING,CANNULATED Right 01/26/2017   Procedure: CANNULATED SCREWS/RIGHT HIP PINNING;  Surgeon: Mcarthur Rossetti, MD;  Location: [place];  Service: Orthopedics;  Laterality: Right;   INNER EAR SURGERY Right 1995   LUMBAR LAMINECTOMY Right 1999   [name]   MASTECTOMY Bilateral    Patient Active Problem List   Diagnosis Date Noted   Pain in left elbow 10/21/2021   Pain in right elbow 10/21/2021   Tremor of right hand 07/23/2021   Titubation 07/23/2021   Weakness of both lower extremities 01/20/2021   Gait instability 01/20/2021   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 ([place]) 09/20/2019   Peroneal neuropathy at knee, left 03/19/2019   Patellar contusion, left,  sequela 02/09/2019   Rib pain on right side 11/24/2018   Acquired absence of both breasts 09/20/2018   Ruptured right breast implant 08/26/2018   History of reconstruction of both breasts 07/08/2018   Breast implant capsular contracture 07/08/2018   Breast pain 07/08/2018   Retrognathia 09/15/2017   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   Status post-operative repair of closed fracture of right hip 09/08/2017   Acute pain of right knee 05/24/2017   Hip fracture ([place]) 01/25/2017   Closed hip fracture, right, initial encounter ([place])    Preop examination    Fibromyalgia    Neuropathy    Corneal abrasion    Nausea    Fall    Seronegative myasthenia gravis ([place]) 07/19/2015   Acute exacerbation of chronic low back pain 07/19/2015    Leukocytosis 07/19/2015   Myasthenia gravis in remission ([place]) 10/23/2014   OSA on CPAP 10/23/2014   Hypersomnia, persistent 10/23/2014   Hypoxemia 08/14/2014   Myasthenia gravis with exacerbation ([place]) 08/14/2014   Myasthenia gravis with exacerbation, ocular ([place]) 12/08/2012   Right shoulder pain 08/02/2012   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   History of optic neuritis 06/27/2012   Rosacea 06/27/2012   S/P laminectomy 06/27/2012   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   S/P bilateral mastectomy 06/26/2012   BRCA negative 06/26/2012   GERD (gastroesophageal reflux disease) 06/26/2012   Menopause 06/26/2012   Ankle pain 06/02/2012    ONSET DATE: 06/17/2022 (MD referral)  REFERRING DIAG: G20.A1 (ICD-10-CM) - Parkinson's disease without dyskinesia, without mention of fluctuations  THERAPY DIAG:  Other abnormalities of gait and mobility  Unsteadiness on feet  Muscle weakness (generalized)  Rationale for Evaluation and Treatment Rehabilitation  SUBJECTIVE:                                                                                                                                                                                              SUBJECTIVE STATEMENT: Doing okay.  No other near falls since last visit.  Did the exercises, but not sure I'm doing them correctly.  To have the DAT scan 11/9. Pt accompanied by: self  PERTINENT HISTORY: Parkinson's dx 01/2022 (not on medication); PMH includes cancer, Myasthenia gravis, lumbar lami/discectomy.  Dequervain's R hand, psoriatic arthritis, R hip surgery due to hip fracture  PAIN:  Are you having pain? Yes: NPRS scale: 5/10 Pain location: back Pain description: achy Aggravating factors: woke up with pain Relieving factors: movement/stretching   PRECAUTIONS: Fall and Other: wearing R wrist brace after injections (followed by Dr. Fredna Dow)  WEIGHT BEARING RESTRICTIONS No  FALLS: Has patient fallen in  last 6 months?  No and near fall in late September  LIVING ENVIRONMENT: Lives with: lives with their family and lives with mother Lives in: House/apartment Stairs: Yes: External: 3 steps; on right going up Has following equipment at home: Single point cane and Walker - 2 wheeled  PLOF: Independent and Vocation/Vocational requirements: part-time job; does yoga and other regular exercise  PATIENT GOALS Pt's goals are to not trip over my foot and to lessen the spasms in L upper thigh  OBJECTIVE:    TODAY'S TREATMENT: 07/02/2022 Activity Comments  Reviewed HEP-see below Cues for technique throughout  Used rockerboard and Airex for compliant surface balance training: -hip and ankle strategy work A/P directions on rockerboard -step strategy work on Airex:   forward/back/side-unable to proceed on Airex due to LLE tremulous activity in weightbearing  Intermittent UE support  Seated use of half-bolster rocking forward/back through ankles for gastroc stretch, 10 reps Feels good stretch  On solid surface, plantarflexion on LLE creates tremulous motion LLE   Multidirection clock step for improved weightshifting  Cues to keep heel down/full foot flat on stance leg  Seated active ankle and lower leg stretching between sets of exercises      Access Code: [email or web address] URL: [email or web address] Date: 06/30/2022 Prepared by: [facility]  Program Notes Wall bumps:  Stand with your back about 4-6 inches from a wall.  Stick your bottom out to touch the wall (your head will come forward), then bring your hips forward to your start position.   Repeat  Exercises - Standing Lumbar Spine Flexion Stretch Counter  - 1-2 x daily - 7 x weekly - 1 sets - 3 reps - 15 sec hold - Seated Hamstring Stretch  - 1-2 x daily - 7 x weekly - 1 sets - 3 reps - 30 sec hold - Staggered Stance Forward Backward Weight Shift with Counter Support  - 1 x daily - 7 x weekly - 2 sets - 10 reps -  Alternating Step forward-Balance Reaction  - 1 x daily - 5 x weekly - 2 sets - 10 reps - Alternating Step Backward with Support  - 1 x daily - 5 x weekly - 2 sets - 10 reps  PATIENT EDUCATION: Education details: Continue current HEP; rocking upon initial standing for relaxation through hips to initiate goo initial movement patterns with gait Person educated: Patient Education method: Customer service manager Education comprehension: verbalized understanding and returned demonstration   ------------------------------------------------------------------------------ (Measures below from evaluation findings): DIAGNOSTIC FINDINGS: NA at this time-awaiting brain MRI and DaT scan  COGNITION: Overall cognitive status: Within functional limits for tasks assessed   SENSATION: WFL to light touch   MUSCLE TONE: WFL to passive ROM BLEs   POSTURE: No Significant postural limitations  LOWER EXTREMITY ROM:   WFL, however some limitations with twitching motion RLE with attempts at full knee extension  Active  Right Eval Left Eval  Hip flexion    Hip extension    Hip abduction    Hip adduction    Hip internal rotation    Hip external rotation    Knee flexion    Knee extension    Ankle dorsiflexion    Ankle plantarflexion    Ankle inversion    Ankle eversion     (Blank rows = not tested)  LOWER EXTREMITY MMT:    MMT Right Eval Left Eval  Hip flexion 4 5  Hip extension    Hip abduction  Hip adduction    Hip internal rotation    Hip external rotation    Knee flexion 4+ 5  Knee extension 4 5  Ankle dorsiflexion 5 5  Ankle plantarflexion    Ankle inversion    Ankle eversion    (Blank rows = not tested)   TRANSFERS: Assistive device utilized: None  Sit to stand: SBA  Pt has hands on knees and has increased forward lean, increased time to push up to stand Stand to sit: SBA  GAIT: Gait pattern:  fasiculations, spasm/tremor in RLE with fatigue, step through pattern,  decreased arm swing- Right, decreased arm swing- Left, decreased step length- Right, and poor foot clearance- Right Distance walked: 40 ft x 4 reps Assistive device utilized: None Level of assistance: Modified independence and SBA Comments: Pt c/o some pain in R hip  FUNCTIONAL TESTs:  5 times sit to stand: 39.97 sec starting with hands at chest, then hands on knees Timed up and go (TUG): 17.31 sec TUG cognitive:  13.96 sec MiniBESTest:  16/28 (scores <22/28 indicate increased fall risk)      PATIENT EDUCATION: Education details: Eval results, POC Person educated: Patient Education method: Explanation Education comprehension: verbalized understanding   HOME EXERCISE PROGRAM: Not yet initiated    GOALS: Goals reviewed with patient? Yes  SHORT TERM GOALS: Target date: 07/24/2022  Pt will be independent with HEP for improved strength, balance, gait. Baseline: Goal status: INITIAL  2.  Pt will improve 5x sit<>stand to less than or equal to 30 sec to demonstrate improved functional strength and transfer efficiency. Baseline: 39.97 sec Goal status: INITIAL  3.  Pt will perform posterior push and release and anterior push and release test in 2 steps or less for improved balance recovery. Baseline: multiple steps, would fall if unaided in posterior direction Goal status: INITIAL  LONG TERM GOALS: Target date: 08/07/2022  Pt will be independent with HEP for improved strength, balance, transfers, and gait. Baseline:  Goal status: INITIAL  2.  Pt will improve 5x sit<>stand to less than or equal to 20 sec to demonstrate improved functional strength and transfer efficiency. Baseline: 39.97 sec Goal status: INITIAL  3.  Pt will improve TUG score to less than or equal to 13.5  sec for decreased fall risk. Baseline: 17.31 Goal status: INITIAL  4.  Pt will improve MiniBESTest score to at least 22/28 to decrease fall risk. Baseline: 16/28 Goal status: INITIAL  5.  Pt will  verbalize understanding of local Parkinson's disease community resources.  Baseline:  Goal status: INITIAL    ASSESSMENT:  CLINICAL IMPRESSION: Skilled PT session today continued to focus on hip, ankle, step strategy work.  Pt fatigues easily through lower legs with ankle and step strategy work.  With standing on Airex surface, pt has increased L foot tremulous motions which are not relieved with weightbearing, but are relieved with off-weighting from LLE.  She does c/o some pain and stiffness at end of session.  She will continue to benefit from skilled PT towards goals for improved overall functional mobility and decreased fall risk.  OBJECTIVE IMPAIRMENTS Abnormal gait, decreased balance, decreased mobility, difficulty walking, and decreased strength.   ACTIVITY LIMITATIONS standing, transfers, and locomotion level  PARTICIPATION LIMITATIONS: community activity  PERSONAL FACTORS 3+ comorbidities: see above for PMH, but complex orthopedic history and pain  are also affecting patient's functional outcome. Also, pt has further brain scan, blood work testing to be completed.  REHAB POTENTIAL: Good  CLINICAL DECISION MAKING: Evolving/moderate  complexity  EVALUATION COMPLEXITY: Moderate  PLAN: PT FREQUENCY: 2x/week  PT DURATION: 6 weeks plus eval  PLANNED INTERVENTIONS: Therapeutic exercises, Therapeutic activity, Neuromuscular re-education, Balance training, Gait training, Patient/Family education, and Self Care  PLAN FOR NEXT SESSION: Continue to work on balance exercises to address hip, ankle, step strategy work and progress as able, sit<>stand transfers from elevated surfaces for improved lower extremity strength; compliant surfaces; multi-direction clock stepping for weightshifting   Danetra Glock W., PT 07/02/2022, 9:34 AM   [place] [facility] [address], [place], [place] [address] Phone # [phone] Fax # [phone]

## 2022-07-03 ENCOUNTER — Ambulatory Visit (HOSPITAL_BASED_OUTPATIENT_CLINIC_OR_DEPARTMENT_OTHER): Payer: PRIVATE HEALTH INSURANCE | Admitting: Physical Therapy

## 2022-07-06 DIAGNOSIS — M25551 Pain in right hip: Secondary | ICD-10-CM | POA: Diagnosis not present

## 2022-07-06 DIAGNOSIS — M5451 Vertebrogenic low back pain: Secondary | ICD-10-CM | POA: Diagnosis not present

## 2022-07-06 DIAGNOSIS — M9904 Segmental and somatic dysfunction of sacral region: Secondary | ICD-10-CM | POA: Diagnosis not present

## 2022-07-06 DIAGNOSIS — F4329 Adjustment disorder with other symptoms: Secondary | ICD-10-CM | POA: Diagnosis not present

## 2022-07-06 DIAGNOSIS — M9905 Segmental and somatic dysfunction of pelvic region: Secondary | ICD-10-CM | POA: Diagnosis not present

## 2022-07-06 DIAGNOSIS — M6281 Muscle weakness (generalized): Secondary | ICD-10-CM | POA: Diagnosis not present

## 2022-07-06 DIAGNOSIS — M9906 Segmental and somatic dysfunction of lower extremity: Secondary | ICD-10-CM | POA: Diagnosis not present

## 2022-07-06 DIAGNOSIS — M25552 Pain in left hip: Secondary | ICD-10-CM | POA: Diagnosis not present

## 2022-07-06 DIAGNOSIS — M9903 Segmental and somatic dysfunction of lumbar region: Secondary | ICD-10-CM | POA: Diagnosis not present

## 2022-07-06 DIAGNOSIS — M79652 Pain in left thigh: Secondary | ICD-10-CM | POA: Diagnosis not present

## 2022-07-07 ENCOUNTER — Ambulatory Visit: Payer: Medicare Other | Admitting: Physical Therapy

## 2022-07-07 ENCOUNTER — Ambulatory Visit (HOSPITAL_BASED_OUTPATIENT_CLINIC_OR_DEPARTMENT_OTHER): Payer: PRIVATE HEALTH INSURANCE | Admitting: Physical Therapy

## 2022-07-07 ENCOUNTER — Encounter: Payer: Self-pay | Admitting: Physical Therapy

## 2022-07-07 DIAGNOSIS — R2689 Other abnormalities of gait and mobility: Secondary | ICD-10-CM | POA: Diagnosis not present

## 2022-07-07 DIAGNOSIS — R2681 Unsteadiness on feet: Secondary | ICD-10-CM | POA: Diagnosis not present

## 2022-07-07 DIAGNOSIS — M6281 Muscle weakness (generalized): Secondary | ICD-10-CM | POA: Diagnosis not present

## 2022-07-07 NOTE — Therapy (Signed)
OUTPATIENT PHYSICAL THERAPY NEURO TREATMENT NOTE   Patient Name: Felicia Cain MRN: [id number] DOB:Jun 24, 1956, 66 y.o., female Today's Date: 07/07/2022   PCP: Lajean Manes, MD REFERRING PROVIDER: Lezlie Octave, MD   PT End of Session - 07/07/22 1158     Visit Number 4    Number of Visits 13    Date for PT Re-Evaluation 08/07/22    Authorization Type Medicare    Authorization - Visit Number 9   (misnumbered last visit); 5 previously used at [facility] upon [place] [facility] eval 06/24/2022   Progress Note Due on Visit 10    PT Start Time 1105    PT Stop Time 1144    PT Time Calculation (min) 39 min    Equipment Utilized During Treatment Gait belt    Activity Tolerance Patient tolerated treatment well    Behavior During Therapy WFL for tasks assessed/performed                Past Medical History:  Diagnosis Date   Anxiety    Arthritis    "back" (01/25/2017)   Brachial neuritis    neuropathy   Chronic lower back pain    DCIS (ductal carcinoma in situ)    "left side?"   Esophageal stricture    stricture with dysphasia   Fibromyalgia    GERD (gastroesophageal reflux disease)    H/O Doppler ultrasound 2006   see scanned study   H/O echocardiogram 2004, 2013   Hepatitis A 1961   "epidemic in my city"   History of cardiac monitoring 2006   History of nuclear stress test 2004   see scanned study   Migraine    "in the past; cycle related" (01/25/2017)   Myasthenia gravis in crisis ([place]) 04/2014   respiratory crisis   NAION (non-arteritic anterior ischemic optic neuropathy), right    Near syncope    Neuromuscular disorder (HCC)    Neuropathy    Optic neuritis    ischaemic optic neuritis, non arteric   Optic neuropathy    PONV (postoperative nausea and vomiting)    Posterior optic neuritis    Ptosis of eyelid    Sleep apnea    "I wear Moses device; I don't wear CPAP" (01/25/2017)   Spinal stenosis    Past Surgical History:  Procedure Laterality Date    AUGMENTATION MAMMAPLASTY     2000, after breast cancer surgery,redone 2010   BACK SURGERY     BREAST BIOPSY     BREAST CAPSULECTOMY WITH IMPLANT EXCHANGE Bilateral 09/28/2018   Procedure: REMOVAL OF BILATERAL BREAST IMPLANTS WITH CAPSULECTOMIES AND REPLACEMENTS OF IMPLANTS;  Surgeon: Wallace Going, DO;  Location: [facility];  Service: Plastics;  Laterality: Bilateral;   CARPAL TUNNEL RELEASE Bilateral    CESAREAN SECTION  1989   ESOPHAGOGASTRODUODENOSCOPY (EGD) WITH ESOPHAGEAL DILATION     HIP PINNING,CANNULATED Right 01/26/2017   Procedure: CANNULATED SCREWS/RIGHT HIP PINNING;  Surgeon: Mcarthur Rossetti, MD;  Location: [facility];  Service: Orthopedics;  Laterality: Right;   INNER EAR SURGERY Right 1995   LUMBAR LAMINECTOMY Right 1999   [name]   MASTECTOMY Bilateral    Patient Active Problem List   Diagnosis Date Noted   Pain in left elbow 10/21/2021   Pain in right elbow 10/21/2021   Tremor of right hand 07/23/2021   Titubation 07/23/2021   Weakness of both lower extremities 01/20/2021   Gait instability 01/20/2021   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 ([place]) 09/20/2019  Peroneal neuropathy at knee, left 03/19/2019   Patellar contusion, left, sequela 02/09/2019   Rib pain on right side 11/24/2018   Acquired absence of both breasts 09/20/2018   Ruptured right breast implant 08/26/2018   History of reconstruction of both breasts 07/08/2018   Breast implant capsular contracture 07/08/2018   Breast pain 07/08/2018   Retrognathia 09/15/2017   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   Status post-operative repair of closed fracture of right hip 09/08/2017   Acute pain of right knee 05/24/2017   Hip fracture ([place]) 01/25/2017   Closed hip fracture, right, initial encounter ([place])    Preop examination    Fibromyalgia    Neuropathy    Corneal abrasion    Nausea    Fall    Seronegative myasthenia gravis ([place])  07/19/2015   Acute exacerbation of chronic low back pain 07/19/2015   Leukocytosis 07/19/2015   Myasthenia gravis in remission ([place]) 10/23/2014   OSA on CPAP 10/23/2014   Hypersomnia, persistent 10/23/2014   Hypoxemia 08/14/2014   Myasthenia gravis with exacerbation ([place]) 08/14/2014   Myasthenia gravis with exacerbation, ocular ([place]) 12/08/2012   Right shoulder pain 08/02/2012   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   History of optic neuritis 06/27/2012   Rosacea 06/27/2012   S/P laminectomy 06/27/2012   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   S/P bilateral mastectomy 06/26/2012   BRCA negative 06/26/2012   GERD (gastroesophageal reflux disease) 06/26/2012   Menopause 06/26/2012   Ankle pain 06/02/2012    ONSET DATE: 06/17/2022 (MD referral)  REFERRING DIAG: G20.A1 (ICD-10-CM) - Parkinson's disease without dyskinesia, without mention of fluctuations  THERAPY DIAG:  Unsteadiness on feet  Other abnormalities of gait and mobility  Rationale for Evaluation and Treatment Rehabilitation  SUBJECTIVE:                                                                                                                                                                                              SUBJECTIVE STATEMENT: Did the exercises.  Just having a lot of pain, especially in R hand, and the LLE had tremor/shaking episode yesterday.  To have the DAT scan 11/9. Pt accompanied by: self  PERTINENT HISTORY: Parkinson's dx 01/2022 (not on medication); PMH includes cancer, Myasthenia gravis, lumbar lami/discectomy.  Dequervain's R hand, psoriatic arthritis, R hip surgery due to hip fracture  PAIN:  Are you having pain? Yes: NPRS scale: 9/10 Pain location: wrist Pain description: achy Aggravating factors: woke up with pain; painful in wrist brace today Relieving factors: movement/stretching.  Pt to go see the MD today.   PRECAUTIONS: Fall and Other: wearing  R wrist brace after  injections (followed by Dr. Fredna Dow)  WEIGHT BEARING RESTRICTIONS No  FALLS: Has patient fallen in last 6 months? No and near fall in late September  LIVING ENVIRONMENT: Lives with: lives with their family and lives with mother Lives in: House/apartment Stairs: Yes: External: 3 steps; on right going up Has following equipment at home: Single point cane and Walker - 2 wheeled  PLOF: Independent and Vocation/Vocational requirements: part-time job; does yoga and other regular exercise  PATIENT GOALS Pt's goals are to not trip over my foot and to lessen the spasms in L upper thigh  OBJECTIVE:    TODAY'S TREATMENT: 07/07/2022 Activity Comments  Forward>back step and weightshift 10 reps each leg Intermittent counter UE support  Multidirection clock step for improved weightshifting    Standing on incline/decline;  marching in place x 10, forward/back step and weightshift x 10 reps; then head turns/head nods 5 reps, EC head steady x 10 sec No UE support; good technique throughout.  Pt does report she has increased stiffness and "shakiness internally" while standing on decline slope  Forward step over obstacle, x 10; side step over obstace x 10 reps LLE fatigues as stance leg; RLE fatigues with lifting.  Discussed increased effort level associated with foot clearance  Alt step taps to cones, 10 reps Intermittent UE support  Short distance gait,25 ft x 6 reps  Cues for increased step length, heelstrike, foot clearance, relaxed arm swing (limited by RUE pain)      Access Code: [email or web address] URL: [email or web address] Date: 06/30/2022 Prepared by: [facility]  Program Notes Wall bumps:  Stand with your back about 4-6 inches from a wall.  Stick your bottom out to touch the wall (your head will come forward), then bring your hips forward to your start position.   Repeat  Exercises - Standing Lumbar Spine Flexion Stretch Counter  - 1-2 x daily - 7  x weekly - 1 sets - 3 reps - 15 sec hold - Seated Hamstring Stretch  - 1-2 x daily - 7 x weekly - 1 sets - 3 reps - 30 sec hold - Staggered Stance Forward Backward Weight Shift with Counter Support  - 1 x daily - 7 x weekly - 2 sets - 10 reps - Alternating Step forward-Balance Reaction  - 1 x daily - 5 x weekly - 2 sets - 10 reps - Alternating Step Backward with Support  - 1 x daily - 5 x weekly - 2 sets - 10 reps   ------------------------------------------------------------------------------ (Measures below from evaluation findings): DIAGNOSTIC FINDINGS: NA at this time-awaiting brain MRI and DaT scan  COGNITION: Overall cognitive status: Within functional limits for tasks assessed   SENSATION: WFL to light touch   MUSCLE TONE: WFL to passive ROM BLEs   POSTURE: No Significant postural limitations  LOWER EXTREMITY ROM:   WFL, however some limitations with twitching motion RLE with attempts at full knee extension  Active  Right Eval Left Eval  Hip flexion    Hip extension    Hip abduction    Hip adduction    Hip internal rotation    Hip external rotation    Knee flexion    Knee extension    Ankle dorsiflexion    Ankle plantarflexion    Ankle inversion    Ankle eversion     (Blank rows = not tested)  LOWER EXTREMITY MMT:    MMT Right Eval Left Eval  Hip  flexion 4 5  Hip extension    Hip abduction    Hip adduction    Hip internal rotation    Hip external rotation    Knee flexion 4+ 5  Knee extension 4 5  Ankle dorsiflexion 5 5  Ankle plantarflexion    Ankle inversion    Ankle eversion    (Blank rows = not tested)   TRANSFERS: Assistive device utilized: None  Sit to stand: SBA  Pt has hands on knees and has increased forward lean, increased time to push up to stand Stand to sit: SBA  GAIT: Gait pattern:  fasiculations, spasm/tremor in RLE with fatigue, step through pattern, decreased arm swing- Right, decreased arm swing- Left, decreased step length-  Right, and poor foot clearance- Right Distance walked: 40 ft x 4 reps Assistive device utilized: None Level of assistance: Modified independence and SBA Comments: Pt c/o some pain in R hip  FUNCTIONAL TESTs:  5 times sit to stand: 39.97 sec starting with hands at chest, then hands on knees Timed up and go (TUG): 17.31 sec TUG cognitive:  13.96 sec MiniBESTest:  16/28 (scores <22/28 indicate increased fall risk)      PATIENT EDUCATION: Education details: Eval results, POC Person educated: Patient Education method: Explanation Education comprehension: verbalized understanding   HOME EXERCISE PROGRAM: Not yet initiated    GOALS: Goals reviewed with patient? Yes  SHORT TERM GOALS: Target date: 07/24/2022  Pt will be independent with HEP for improved strength, balance, gait. Baseline: Goal status: IN PROGRESS  2.  Pt will improve 5x sit<>stand to less than or equal to 30 sec to demonstrate improved functional strength and transfer efficiency. Baseline: 39.97 sec Goal status: IN PROGRESS  3.  Pt will perform posterior push and release and anterior push and release test in 2 steps or less for improved balance recovery. Baseline: multiple steps, would fall if unaided in posterior direction Goal status: IN PROGRESS  LONG TERM GOALS: Target date: 08/07/2022  Pt will be independent with HEP for improved strength, balance, transfers, and gait. Baseline:  Goal status: IN PROGRESS  2.  Pt will improve 5x sit<>stand to less than or equal to 20 sec to demonstrate improved functional strength and transfer efficiency. Baseline: 39.97 sec Goal status: IN PROGRESS  3.  Pt will improve TUG score to less than or equal to 13.5  sec for decreased fall risk. Baseline: 17.31 Goal status: IN PROGRESS  4.  Pt will improve MiniBESTest score to at least 22/28 to decrease fall risk. Baseline: 16/28 Goal status:IN PROGRESS  5.  Pt will verbalize understanding of local Parkinson's  disease community resources.  Baseline:  Goal status: IN PROGRESS    ASSESSMENT:  CLINICAL IMPRESSION: Continued work in skilled PT session today focusing on step strategy work in varied directions.  Worked on incline/decline slope in clinic, with pt reporting definitely more instability, stiffness, and internal shakiness while standing on downward slope.  Towards end of session with LLE as weightbearing leg with stepping over obstacles, slight tremulous motion noted in L quads, but not as significant as last session.  She reports 9/10 pain in R UE today and is heavily guarding RUE (to leave from PT session to go to Dr. Levell July office).  She will continue to benefit from skilled PT towards goals for improved overall functional mobility and decreased fall risk.  OBJECTIVE IMPAIRMENTS Abnormal gait, decreased balance, decreased mobility, difficulty walking, and decreased strength.   ACTIVITY LIMITATIONS standing, transfers, and locomotion level  PARTICIPATION LIMITATIONS: community activity  PERSONAL FACTORS 3+ comorbidities: see above for PMH, but complex orthopedic history and pain  are also affecting patient's functional outcome. Also, pt has further brain scan, blood work testing to be completed.  REHAB POTENTIAL: Good  CLINICAL DECISION MAKING: Evolving/moderate complexity  EVALUATION COMPLEXITY: Moderate  PLAN: PT FREQUENCY: 2x/week  PT DURATION: 6 weeks plus eval  PLANNED INTERVENTIONS: Therapeutic exercises, Therapeutic activity, Neuromuscular re-education, Balance training, Gait training, Patient/Family education, and Self Care  PLAN FOR NEXT SESSION: Continue to work on balance exercises to address hip, ankle, step strategy work and progress HEP as able, sit<>stand transfers from elevated surfaces for improved lower extremity strength; compliant surfaces; multi-direction clock stepping for weightshifting   Leyani Gargus W., PT 07/07/2022, 12:00 PM   [facility] [address], [place], [place] [address] Phone # [phone] Fax # [phone]

## 2022-07-08 ENCOUNTER — Telehealth: Payer: Self-pay | Admitting: Neurology

## 2022-07-08 DIAGNOSIS — R2681 Unsteadiness on feet: Secondary | ICD-10-CM

## 2022-07-08 DIAGNOSIS — R251 Tremor, unspecified: Secondary | ICD-10-CM

## 2022-07-08 DIAGNOSIS — G7000 Myasthenia gravis without (acute) exacerbation: Secondary | ICD-10-CM

## 2022-07-08 DIAGNOSIS — R29898 Other symptoms and signs involving the musculoskeletal system: Secondary | ICD-10-CM

## 2022-07-08 NOTE — Telephone Encounter (Signed)
  07-08-2022 I met with Dr. Gerald Dexter and she explained that her neck pain, fasciculation sensation and stiffness sensation have progressed. She mentioned a brain MRI had been ordered by movement disorder Lezlie Octave, MD at [facility].  I would like to add a cervical spine MRI and direct this request to Dr. Owens Shark. If she permits to have the study done at [facility], the patient  would prefer this to be done same day.  I left a voicemail with nursing/ outpatient clinic as I do not have a direct line.   [phone] fax.   Larey Seat, MD

## 2022-07-09 ENCOUNTER — Encounter: Payer: Self-pay | Admitting: Physical Therapy

## 2022-07-09 ENCOUNTER — Ambulatory Visit: Payer: Medicare Other | Attending: Psychiatry | Admitting: Physical Therapy

## 2022-07-09 ENCOUNTER — Telehealth: Payer: Self-pay | Admitting: Neurology

## 2022-07-09 DIAGNOSIS — R2689 Other abnormalities of gait and mobility: Secondary | ICD-10-CM | POA: Diagnosis not present

## 2022-07-09 DIAGNOSIS — R2681 Unsteadiness on feet: Secondary | ICD-10-CM | POA: Insufficient documentation

## 2022-07-09 DIAGNOSIS — M6281 Muscle weakness (generalized): Secondary | ICD-10-CM | POA: Diagnosis not present

## 2022-07-09 NOTE — Telephone Encounter (Signed)
medicare NPR sent to GI [phone] 
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
## 2022-07-09 NOTE — Telephone Encounter (Signed)
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 I will order a cervical MRI with and without contrast for here and give the patient her results before the follow up with Dr Mervyn Skeeters.   CD

## 2022-07-09 NOTE — Addendum Note (Signed)
Addended by: Larey Seat on: 07/09/2022 09:13 AM   Modules accepted: Orders

## 2022-07-13 ENCOUNTER — Telehealth: Payer: Self-pay | Admitting: Neurology

## 2022-07-13 DIAGNOSIS — G7000 Myasthenia gravis without (acute) exacerbation: Secondary | ICD-10-CM

## 2022-07-13 DIAGNOSIS — R29898 Other symptoms and signs involving the musculoskeletal system: Secondary | ICD-10-CM

## 2022-07-13 DIAGNOSIS — F4329 Adjustment disorder with other symptoms: Secondary | ICD-10-CM | POA: Diagnosis not present

## 2022-07-13 DIAGNOSIS — R2681 Unsteadiness on feet: Secondary | ICD-10-CM

## 2022-07-13 DIAGNOSIS — R251 Tremor, unspecified: Secondary | ICD-10-CM

## 2022-07-13 NOTE — Telephone Encounter (Signed)
Pt is calling. Stated she was told by Dr. Brett Fairy to call and speak with her nurse. Pt would not tell me issues. Stated she would like a call back from nurse.

## 2022-07-13 NOTE — Telephone Encounter (Signed)
Called pt. She would like MRI cervical w wo ordered to be done at [facility]. She is already getting another MRI 07/16/22 and wanting to do both same day if possible. MRI order needs to be sent to them at [phone].   She would like Tori to call with update once things have been sent over.

## 2022-07-14 ENCOUNTER — Ambulatory Visit: Payer: Medicare Other | Admitting: Physical Therapy

## 2022-07-14 ENCOUNTER — Encounter: Payer: Self-pay | Admitting: Physical Therapy

## 2022-07-14 DIAGNOSIS — R2681 Unsteadiness on feet: Secondary | ICD-10-CM

## 2022-07-14 DIAGNOSIS — R2689 Other abnormalities of gait and mobility: Secondary | ICD-10-CM

## 2022-07-14 DIAGNOSIS — M6281 Muscle weakness (generalized): Secondary | ICD-10-CM

## 2022-07-14 NOTE — Telephone Encounter (Signed)
Order faxed to [facility]

## 2022-07-14 NOTE — Therapy (Signed)
OUTPATIENT PHYSICAL THERAPY NEURO TREATMENT NOTE  Patient Name: Felicia Cain MRN: [id number] DOB:1956/05/31, 66 y.o., female Today's Date: 07/14/2022   PCP: Lajean Manes, MD REFERRING PROVIDER: Lezlie Octave, MD       PT End of Session - 07/14/22 0847     Visit Number 6    Number of Visits 13    Date for PT Re-Evaluation 08/07/22    Authorization Type Medicare    Authorization - Visit Number 11   (misnumbered last visit); 5 previously used at [facility] upon [facility] eval 06/24/2022   Progress Note Due on Visit --   completed 07/09/2022 (due to combined visits from [facility] and [place])   PT Start Time [phone]    PT Stop Time 0928    PT Time Calculation (min) 39 min    Activity Tolerance Patient tolerated treatment well    Behavior During Therapy [facility] for tasks assessed/performed                Past Medical History:  Diagnosis Date   Anxiety    Arthritis    "back" (01/25/2017)   Brachial neuritis    neuropathy   Chronic lower back pain    DCIS (ductal carcinoma in situ)    "left side?"   Esophageal stricture    stricture with dysphasia   Fibromyalgia    GERD (gastroesophageal reflux disease)    H/O Doppler ultrasound 2006   see scanned study   H/O echocardiogram 2004, 2013   Hepatitis A 1961   "epidemic in my city"   History of cardiac monitoring 2006   History of nuclear stress test 2004   see scanned study   Migraine    "in the past; cycle related" (01/25/2017)   Myasthenia gravis in crisis ([place]) 04/2014   respiratory crisis   NAION (non-arteritic anterior ischemic optic neuropathy), right    Near syncope    Neuromuscular disorder (HCC)    Neuropathy    Optic neuritis    ischaemic optic neuritis, non arteric   Optic neuropathy    PONV (postoperative nausea and vomiting)    Posterior optic neuritis    Ptosis of eyelid    Sleep apnea    "I wear Moses device; I don't wear CPAP" (01/25/2017)   Spinal stenosis    Past Surgical  History:  Procedure Laterality Date   AUGMENTATION MAMMAPLASTY     2000, after breast cancer surgery,redone 2010   BACK SURGERY     BREAST BIOPSY     BREAST CAPSULECTOMY WITH IMPLANT EXCHANGE Bilateral 09/28/2018   Procedure: REMOVAL OF BILATERAL BREAST IMPLANTS WITH CAPSULECTOMIES AND REPLACEMENTS OF IMPLANTS;  Surgeon: Wallace Going, DO;  Location: [facility];  Service: Plastics;  Laterality: Bilateral;   CARPAL TUNNEL RELEASE Bilateral    CESAREAN SECTION  1989   ESOPHAGOGASTRODUODENOSCOPY (EGD) WITH ESOPHAGEAL DILATION     HIP PINNING,CANNULATED Right 01/26/2017   Procedure: CANNULATED SCREWS/RIGHT HIP PINNING;  Surgeon: Mcarthur Rossetti, MD;  Location: [facility];  Service: Orthopedics;  Laterality: Right;   INNER EAR SURGERY Right 1995   LUMBAR LAMINECTOMY Right 1999   [name]   MASTECTOMY Bilateral    Patient Active Problem List   Diagnosis Date Noted   Pain in left elbow 10/21/2021   Pain in right elbow 10/21/2021   Tremor of right hand 07/23/2021   Titubation 07/23/2021   Weakness of both lower extremities 01/20/2021   Gait instability 01/20/2021   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 ([place]) 09/20/2019   Peroneal neuropathy at knee, left 03/19/2019   Patellar contusion, left, sequela 02/09/2019   Rib pain on right side 11/24/2018   Acquired absence of both breasts 09/20/2018   Ruptured right breast implant 08/26/2018   History of reconstruction of both breasts 07/08/2018   Breast implant capsular contracture 07/08/2018   Breast pain 07/08/2018   Retrognathia 09/15/2017   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   Status post-operative repair of closed fracture of right hip 09/08/2017   Acute pain of right knee 05/24/2017   Hip fracture ([place]) 01/25/2017   Closed hip fracture, right, initial encounter ([place])    Preop examination    Fibromyalgia    Neuropathy    Corneal abrasion    Nausea    Fall     Seronegative myasthenia gravis ([place]) 07/19/2015   Acute exacerbation of chronic low back pain 07/19/2015   Leukocytosis 07/19/2015   Myasthenia gravis in remission ([place]) 10/23/2014   OSA on CPAP 10/23/2014   Hypersomnia, persistent 10/23/2014   Hypoxemia 08/14/2014   Myasthenia gravis with exacerbation ([place]) 08/14/2014   Myasthenia gravis with exacerbation, ocular ([place]) 12/08/2012   Right shoulder pain 08/02/2012   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   History of optic neuritis 06/27/2012   Rosacea 06/27/2012   S/P laminectomy 06/27/2012   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   S/P bilateral mastectomy 06/26/2012   BRCA negative 06/26/2012   GERD (gastroesophageal reflux disease) 06/26/2012   Menopause 06/26/2012   Ankle pain 06/02/2012    ONSET DATE: 06/17/2022 (MD referral)  REFERRING DIAG: G20.A1 (ICD-10-CM) - Parkinson's disease without dyskinesia, without mention of fluctuations  THERAPY DIAG:  Unsteadiness on feet  Other abnormalities of gait and mobility  Muscle weakness (generalized)  Rationale for Evaluation and Treatment Rehabilitation  SUBJECTIVE:                                                                                                                                                                                              SUBJECTIVE STATEMENT: Tried the [place] yesterday at the [facility] and I liked it.  Did some walking yesterday for at least a mile.  Did okay; no falls. (To have the DAT scan 11/9.) Pt accompanied by: self  PERTINENT HISTORY: Parkinson's dx 01/2022 (not on medication); PMH includes cancer, Myasthenia gravis, lumbar lami/discectomy.  Dequervain's R hand, psoriatic arthritis, R hip surgery due to hip fracture  PAIN:  Are you having pain? Yes: NPRS scale: 6/10 Pain location: lower back Pain description: achy Aggravating factors: woke up with pain Relieving factors: movement/stretching.  PRECAUTIONS: Fall and Other: wearing R  wrist brace after injections (followed by Dr. Fredna Dow)  WEIGHT BEARING RESTRICTIONS No  FALLS: Has patient fallen in last 6 months? No and near fall in late September  LIVING ENVIRONMENT: Lives with: lives with their family and lives with mother Lives in: House/apartment Stairs: Yes: External: 3 steps; on right going up Has following equipment at home: Single point cane and Walker - 2 wheeled  PLOF: Independent and Vocation/Vocational requirements: part-time job; does yoga and other regular exercise  PATIENT GOALS Pt's goals are to not trip over my foot and to lessen the spasms in L upper thigh  OBJECTIVE:     TODAY'S TREATMENT: 07/14/2022  Pt performs PWR! Moves in sitting, modified quadruped at counter (propped on forearms), then in standing position    PWR! Up for improved posture   PWR! Rock for improved weighshifting   PWR! Twist for improved trunk rotation   PWR! Step for improved step initiation  Cues provided for technique, fluid movement patterns.  Extra time for correct technique.  No nausea, only one episode of fasiculations in L thigh with sitting PWR! Moves.  Gait at end of session, 75 ft x 2 reps with good stride length, good arm swing, decreased rigid and stiff posture noted today versus last several visits.     PATIENT EDUCATION: Education details: HEP update to include PWR! Moves sitting; provided link to [facility] PWR! Moves YouTube for sitting/modified quadruped and standing positions Person educated: Patient Education method: Explanation, Demonstration, Handouts, and emailed link to [employer]! Moves Education comprehension: verbalized understanding, returned demonstration, and needs further education   Access Code: [email or web address] URL: [email or web address] Date: 06/30/2022>07/14/2022 Prepared by: [facility]  Program Notes Wall bumps:  Stand with your back about 4-6 inches from a wall.  Stick your bottom out  to touch the wall (your head will come forward), then bring your hips forward to your start position.   Repeat  Exercises - Standing Lumbar Spine Flexion Stretch Counter  - 1-2 x daily - 7 x weekly - 1 sets - 3 reps - 15 sec hold - Seated Hamstring Stretch  - 1-2 x daily - 7 x weekly - 1 sets - 3 reps - 30 sec hold - Staggered Stance Forward Backward Weight Shift with Counter Support  - 1 x daily - 7 x weekly - 2 sets - 10 reps - Alternating Step forward-Balance Reaction  - 1 x daily - 5 x weekly - 2 sets - 10 reps - Alternating Step Backward with Support  - 1 x daily - 5 x weekly - 2 sets - 10 reps *PWR! Moves standing/Sitting/Modified [place]! Up, Rock, Step, 2 sets of 10, at counter, 2x/day  ------------------------------------------------------------------------------ (Measures below from evaluation findings): DIAGNOSTIC FINDINGS: NA at this time-awaiting brain MRI and DaT scan  COGNITION: Overall cognitive status: Within functional limits for tasks assessed   SENSATION: WFL to light touch   MUSCLE TONE: WFL to passive ROM BLEs   POSTURE: No Significant postural limitations  LOWER EXTREMITY ROM:   WFL, however some limitations with twitching motion RLE with attempts at full knee extension  Active  Right Eval Left Eval  Hip flexion    Hip extension    Hip abduction    Hip adduction    Hip internal rotation    Hip external rotation    Knee flexion    Knee extension    Ankle dorsiflexion  Ankle plantarflexion    Ankle inversion    Ankle eversion     (Blank rows = not tested)  LOWER EXTREMITY MMT:    MMT Right Eval Left Eval  Hip flexion 4 5  Hip extension    Hip abduction    Hip adduction    Hip internal rotation    Hip external rotation    Knee flexion 4+ 5  Knee extension 4 5  Ankle dorsiflexion 5 5  Ankle plantarflexion    Ankle inversion    Ankle eversion    (Blank rows = not tested)   TRANSFERS: Assistive device utilized: None  Sit to  stand: SBA  Pt has hands on knees and has increased forward lean, increased time to push up to stand Stand to sit: SBA  GAIT: Gait pattern:  fasiculations, spasm/tremor in RLE with fatigue, step through pattern, decreased arm swing- Right, decreased arm swing- Left, decreased step length- Right, and poor foot clearance- Right Distance walked: 40 ft x 4 reps Assistive device utilized: None Level of assistance: Modified independence and SBA Comments: Pt c/o some pain in R hip  FUNCTIONAL TESTs:  5 times sit to stand: 39.97 sec starting with hands at chest, then hands on knees Timed up and go (TUG): 17.31 sec TUG cognitive:  13.96 sec MiniBESTest:  16/28 (scores <22/28 indicate increased fall risk)      PATIENT EDUCATION: Education details: Eval results, POC Person educated: Patient Education method: Explanation Education comprehension: verbalized understanding   HOME EXERCISE PROGRAM: Not yet initiated    GOALS: Goals reviewed with patient? Yes  SHORT TERM GOALS: Target date: 07/24/2022  Pt will be independent with HEP for improved strength, balance, gait. Baseline: Goal status: IN PROGRESS  2.  Pt will improve 5x sit<>stand to less than or equal to 30 sec to demonstrate improved functional strength and transfer efficiency. Baseline: 39.97 sec Goal status: IN PROGRESS  3.  Pt will perform posterior push and release and anterior push and release test in 2 steps or less for improved balance recovery. Baseline: multiple steps, would fall if unaided in posterior direction Goal status: IN PROGRESS  LONG TERM GOALS: Target date: 08/07/2022  Pt will be independent with HEP for improved strength, balance, transfers, and gait. Baseline:  Goal status: IN PROGRESS  2.  Pt will improve 5x sit<>stand to less than or equal to 20 sec to demonstrate improved functional strength and transfer efficiency. Baseline: 39.97 sec Goal status: IN PROGRESS  3.  Pt will improve TUG score  to less than or equal to 13.5  sec for decreased fall risk. Baseline: 17.31 Goal status: IN PROGRESS  4.  Pt will improve MiniBESTest score to at least 22/28 to decrease fall risk. Baseline: 16/28 Goal status:IN PROGRESS  5.  Pt will verbalize understanding of local Parkinson's disease community resources.  Baseline:  Goal status: IN PROGRESS    ASSESSMENT:  CLINICAL IMPRESSION: Pt arrives at session today with reports of trying PD cycling class last week and Parkinson's TaiChi earlier this week.  She reports really liking the [place].  She also has been performing standing PWR! Moves added to HEP last visit.  Worked today more fully on additional positions of PWR! Moves-sitting, modified quadruped at counter, propped on forearms, in addition to standing.  Worked on incorporating PWR! Twist in each position.  Overall, pt does not have c/o nausea today and pt only has one episode of L thigh fasiculations in sitting exercises.  She does demonstrate  good fluid movements patterns throughout session today.  Discussed that [place] and PWR! Moves may be a good combination to assist with flexibility, weightshifting, posture and overall balance without the high intensity movement patterns that tend to bring on fatigue and fasiculations.  She will continue to benefit from skilled PT towards goals for improved overall functional mobility and decreased fall risk.  OBJECTIVE IMPAIRMENTS Abnormal gait, decreased balance, decreased mobility, difficulty walking, and decreased strength.   ACTIVITY LIMITATIONS standing, transfers, and locomotion level  PARTICIPATION LIMITATIONS: community activity  PERSONAL FACTORS 3+ comorbidities: see above for PMH, but complex orthopedic history and pain  are also affecting patient's functional outcome. Also, pt has further brain scan, blood work testing to be completed.  REHAB POTENTIAL: Good  CLINICAL DECISION MAKING: Evolving/moderate complexity  EVALUATION  COMPLEXITY: Moderate  PLAN: PT FREQUENCY: 2x/week  PT DURATION: 6 weeks plus eval  PLANNED INTERVENTIONS: Therapeutic exercises, Therapeutic activity, Neuromuscular re-education, Balance training, Gait training, Patient/Family education, and Self Care  PLAN FOR NEXT SESSION: Ask how additional PWR! Moves exercises went at home.  Ask about MD visit to [place].  Continue to work on balance exercises to address hip, ankle, step strategy work and progress HEP as able, sit<>stand transfers from elevated surfaces for improved lower extremity strength; compliant surfaces; multi-direction clock stepping for weightshifting   Montia Haslip W., PT 07/14/2022, 9:28 AM   [place], [address] [place], [place] [address] Phone # [phone] Fax # [phone]

## 2022-07-14 NOTE — Telephone Encounter (Signed)
Order printed, waiting on Dr. Felecia Shelling to sign.

## 2022-07-15 ENCOUNTER — Other Ambulatory Visit: Payer: Self-pay | Admitting: Sports Medicine

## 2022-07-16 DIAGNOSIS — M4802 Spinal stenosis, cervical region: Secondary | ICD-10-CM | POA: Diagnosis not present

## 2022-07-16 DIAGNOSIS — G20A1 Parkinson's disease without dyskinesia, without mention of fluctuations: Secondary | ICD-10-CM | POA: Diagnosis not present

## 2022-07-16 DIAGNOSIS — M50322 Other cervical disc degeneration at C5-C6 level: Secondary | ICD-10-CM | POA: Diagnosis not present

## 2022-07-16 DIAGNOSIS — G959 Disease of spinal cord, unspecified: Secondary | ICD-10-CM | POA: Diagnosis not present

## 2022-07-16 DIAGNOSIS — M4722 Other spondylosis with radiculopathy, cervical region: Secondary | ICD-10-CM | POA: Diagnosis not present

## 2022-07-16 DIAGNOSIS — I6782 Cerebral ischemia: Secondary | ICD-10-CM | POA: Diagnosis not present

## 2022-07-16 DIAGNOSIS — M5412 Radiculopathy, cervical region: Secondary | ICD-10-CM | POA: Diagnosis not present

## 2022-07-17 ENCOUNTER — Ambulatory Visit: Payer: Medicare Other | Admitting: Physical Therapy

## 2022-07-17 ENCOUNTER — Encounter: Payer: Self-pay | Admitting: Physical Therapy

## 2022-07-17 DIAGNOSIS — M6281 Muscle weakness (generalized): Secondary | ICD-10-CM | POA: Diagnosis not present

## 2022-07-17 DIAGNOSIS — R2681 Unsteadiness on feet: Secondary | ICD-10-CM

## 2022-07-17 DIAGNOSIS — R2689 Other abnormalities of gait and mobility: Secondary | ICD-10-CM

## 2022-07-17 NOTE — Therapy (Signed)
OUTPATIENT PHYSICAL THERAPY NEURO TREATMENT NOTE  Patient Name: Felicia Cain MRN: [id number] DOB:1955/10/01, 66 y.o., female Today's Date: 07/17/2022   PCP: Lajean Manes, MD REFERRING PROVIDER: Lezlie Octave, MD       PT End of Session - 07/17/22 1025     Visit Number 7    Number of Visits 13    Date for PT Re-Evaluation 08/07/22    Authorization Type Medicare    Authorization - Visit Number 11   (misnumbered last visit); 5 previously used at [place] upon [facility] eval 06/24/2022   Progress Note Due on Visit --   completed 07/09/2022 (due to combined visits from [facility] and [place])   PT Start Time 1027   pt arrives late   PT Stop Time 1100    PT Time Calculation (min) 33 min    Activity Tolerance Patient tolerated treatment well    Behavior During Therapy [facility] for tasks assessed/performed                Past Medical History:  Diagnosis Date   Anxiety    Arthritis    "back" (01/25/2017)   Brachial neuritis    neuropathy   Chronic lower back pain    DCIS (ductal carcinoma in situ)    "left side?"   Esophageal stricture    stricture with dysphasia   Fibromyalgia    GERD (gastroesophageal reflux disease)    H/O Doppler ultrasound 2006   see scanned study   H/O echocardiogram 2004, 2013   Hepatitis A 1961   "epidemic in my city"   History of cardiac monitoring 2006   History of nuclear stress test 2004   see scanned study   Migraine    "in the past; cycle related" (01/25/2017)   Myasthenia gravis in crisis ([place]) 04/2014   respiratory crisis   NAION (non-arteritic anterior ischemic optic neuropathy), right    Near syncope    Neuromuscular disorder (HCC)    Neuropathy    Optic neuritis    ischaemic optic neuritis, non arteric   Optic neuropathy    PONV (postoperative nausea and vomiting)    Posterior optic neuritis    Ptosis of eyelid    Sleep apnea    "I wear Moses device; I don't wear CPAP" (01/25/2017)   Spinal stenosis     Past Surgical History:  Procedure Laterality Date   AUGMENTATION MAMMAPLASTY     2000, after breast cancer surgery,redone 2010   BACK SURGERY     BREAST BIOPSY     BREAST CAPSULECTOMY WITH IMPLANT EXCHANGE Bilateral 09/28/2018   Procedure: REMOVAL OF BILATERAL BREAST IMPLANTS WITH CAPSULECTOMIES AND REPLACEMENTS OF IMPLANTS;  Surgeon: Wallace Going, DO;  Location: [place];  Service: Plastics;  Laterality: Bilateral;   CARPAL TUNNEL RELEASE Bilateral    CESAREAN SECTION  1989   ESOPHAGOGASTRODUODENOSCOPY (EGD) WITH ESOPHAGEAL DILATION     HIP PINNING,CANNULATED Right 01/26/2017   Procedure: CANNULATED SCREWS/RIGHT HIP PINNING;  Surgeon: Mcarthur Rossetti, MD;  Location: [place];  Service: Orthopedics;  Laterality: Right;   INNER EAR SURGERY Right 1995   LUMBAR LAMINECTOMY Right 1999   [name]   MASTECTOMY Bilateral    Patient Active Problem List   Diagnosis Date Noted   Pain in left elbow 10/21/2021   Pain in right elbow 10/21/2021   Tremor of right hand 07/23/2021   Titubation 07/23/2021   Weakness of both lower extremities 01/20/2021   Gait instability 01/20/2021   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 ([place]) 09/20/2019   Peroneal neuropathy at knee, left 03/19/2019   Patellar contusion, left, sequela 02/09/2019   Rib pain on right side 11/24/2018   Acquired absence of both breasts 09/20/2018   Ruptured right breast implant 08/26/2018   History of reconstruction of both breasts 07/08/2018   Breast implant capsular contracture 07/08/2018   Breast pain 07/08/2018   Retrognathia 09/15/2017   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   Status post-operative repair of closed fracture of right hip 09/08/2017   Acute pain of right knee 05/24/2017   Hip fracture ([place]) 01/25/2017   Closed hip fracture, right, initial encounter ([place])    Preop examination    Fibromyalgia    Neuropathy    Corneal abrasion    Nausea     Fall    Seronegative myasthenia gravis ([place]) 07/19/2015   Acute exacerbation of chronic low back pain 07/19/2015   Leukocytosis 07/19/2015   Myasthenia gravis in remission ([place]) 10/23/2014   OSA on CPAP 10/23/2014   Hypersomnia, persistent 10/23/2014   Hypoxemia 08/14/2014   Myasthenia gravis with exacerbation ([place]) 08/14/2014   Myasthenia gravis with exacerbation, ocular ([place]) 12/08/2012   Right shoulder pain 08/02/2012   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   History of optic neuritis 06/27/2012   Rosacea 06/27/2012   S/P laminectomy 06/27/2012   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   S/P bilateral mastectomy 06/26/2012   BRCA negative 06/26/2012   GERD (gastroesophageal reflux disease) 06/26/2012   Menopause 06/26/2012   Ankle pain 06/02/2012    ONSET DATE: 06/17/2022 (MD referral)  REFERRING DIAG: G20.A1 (ICD-10-CM) - Parkinson's disease without dyskinesia, without mention of fluctuations  THERAPY DIAG:  Unsteadiness on feet  Other abnormalities of gait and mobility  Muscle weakness (generalized)  Rationale for Evaluation and Treatment Rehabilitation  SUBJECTIVE:                                                                                                                                                                                              SUBJECTIVE STATEMENT: Had  multiple tests at [facility] yesterday.  Had the two MRIs and the DatScan and it was a long day.    Did the PWR! Moves after PT session last visit and they went well. Pt accompanied by: self  PERTINENT HISTORY: Parkinson's dx 01/2022 (not on medication); PMH includes cancer, Myasthenia gravis, lumbar lami/discectomy.  Dequervain's R hand, psoriatic arthritis, R hip surgery due to hip fracture  PAIN:  Are you having pain? Yes: NPRS scale: 6/10 Pain location: lower back Pain description: achy Aggravating factors:  woke up with pain Relieving factors: movement/stretching.      PRECAUTIONS: Fall and Other: wearing R wrist brace after injections (followed by Dr. Fredna Dow)  WEIGHT BEARING RESTRICTIONS No  FALLS: Has patient fallen in last 6 months? No and near fall in late September  LIVING ENVIRONMENT: Lives with: lives with their family and lives with mother Lives in: House/apartment Stairs: Yes: External: 3 steps; on right going up Has following equipment at home: Single point cane and Walker - 2 wheeled  PLOF: Independent and Vocation/Vocational requirements: part-time job; does yoga and other regular exercise  PATIENT GOALS Pt's goals are to not trip over my foot and to lessen the spasms in L upper thigh  OBJECTIVE:     TODAY'S TREATMENT: 07/16/2022  Reviewed/performed PWR! Moves in sitting, modified quadruped at counter (propped on forearms), then in standing position  x 10 reps each  PWR! Up for improved posture   PWR! Rock for improved weighshifting   PWR! Twist for improved trunk rotation   PWR! Step for improved step initiation  Pt performs exercises with good form, technique  5x sit<>stand:  19.66 sec (improved from >39 sec) TUG:  11.06 sec (improved from >17 sec)  Performed push and release test forward and back directions, 2 reps:  1 step to recover  Performed standing on incline with EC x 30 seconds,  then standing on A[place] with head motions EO and EC head steady x 30 seconds.  No LOB.   PATIENT EDUCATION: Education details: POC and progress towards goals Person educated: Patient Education method: Explanation Education comprehension: verbalized understanding   Access Code: [email or web address] URL: [email or web address] Date: 06/30/2022>07/14/2022 Prepared by: [facility]  Program Notes Wall bumps:  Stand with your back about 4-6 inches from a wall.  Stick your bottom out to touch the wall (your head will come forward), then bring your hips forward to your start position.    Repeat  Exercises - Standing Lumbar Spine Flexion Stretch Counter  - 1-2 x daily - 7 x weekly - 1 sets - 3 reps - 15 sec hold - Seated Hamstring Stretch  - 1-2 x daily - 7 x weekly - 1 sets - 3 reps - 30 sec hold - Staggered Stance Forward Backward Weight Shift with Counter Support  - 1 x daily - 7 x weekly - 2 sets - 10 reps - Alternating Step forward-Balance Reaction  - 1 x daily - 5 x weekly - 2 sets - 10 reps - Alternating Step Backward with Support  - 1 x daily - 5 x weekly - 2 sets - 10 reps *PWR! Moves standing/Sitting/Modified [place]! Up, Rock, Step, 2 sets of 10, at counter, 2x/day  ------------------------------------------------------------------------------ (Measures below from evaluation findings): DIAGNOSTIC FINDINGS: NA at this time-awaiting brain MRI and DaT scan  COGNITION: Overall cognitive status: Within functional limits for tasks assessed   SENSATION: WFL to light touch   MUSCLE TONE: WFL to passive ROM BLEs   POSTURE: No Significant postural limitations  LOWER EXTREMITY ROM:   WFL, however some limitations with twitching motion RLE with attempts at full knee extension  Active  Right Eval Left Eval  Hip flexion    Hip extension    Hip abduction    Hip adduction    Hip internal rotation    Hip external rotation    Knee flexion    Knee extension    Ankle dorsiflexion    Ankle plantarflexion  Ankle inversion    Ankle eversion     (Blank rows = not tested)  LOWER EXTREMITY MMT:    MMT Right Eval Left Eval  Hip flexion 4 5  Hip extension    Hip abduction    Hip adduction    Hip internal rotation    Hip external rotation    Knee flexion 4+ 5  Knee extension 4 5  Ankle dorsiflexion 5 5  Ankle plantarflexion    Ankle inversion    Ankle eversion    (Blank rows = not tested)   TRANSFERS: Assistive device utilized: None  Sit to stand: SBA  Pt has hands on knees and has increased forward lean, increased time to push up to  stand Stand to sit: SBA  GAIT: Gait pattern:  fasiculations, spasm/tremor in RLE with fatigue, step through pattern, decreased arm swing- Right, decreased arm swing- Left, decreased step length- Right, and poor foot clearance- Right Distance walked: 40 ft x 4 reps Assistive device utilized: None Level of assistance: Modified independence and SBA Comments: Pt c/o some pain in R hip  FUNCTIONAL TESTs:  5 times sit to stand: 39.97 sec starting with hands at chest, then hands on knees Timed up and go (TUG): 17.31 sec TUG cognitive:  13.96 sec MiniBESTest:  16/28 (scores <22/28 indicate increased fall risk)      PATIENT EDUCATION: Education details: Eval results, POC Person educated: Patient Education method: Explanation Education comprehension: verbalized understanding   HOME EXERCISE PROGRAM: Not yet initiated    GOALS: Goals reviewed with patient? Yes  SHORT TERM GOALS: Target date: 07/24/2022  Pt will be independent with HEP for improved strength, balance, gait. Baseline: Goal status: GOAL MET  2.  Pt will improve 5x sit<>stand to less than or equal to 30 sec to demonstrate improved functional strength and transfer efficiency. Baseline: 39.97 sec>19.66 sec 07/17/2022 Goal status: GOAL MET  3.  Pt will perform posterior push and release and anterior push and release test in 2 steps or less for improved balance recovery. Baseline: multiple steps, would fall if unaided in posterior direction>1 step in anterior and 1 step in posterior position 07/17/2022 Goal status: GOAL MET  LONG TERM GOALS: Target date: 08/07/2022  Pt will be independent with HEP for improved strength, balance, transfers, and gait. Baseline:  Goal status: IN PROGRESS  2.  Pt will improve 5x sit<>stand to less than or equal to 20 sec to demonstrate improved functional strength and transfer efficiency. Baseline: 39.97 sec Goal status: GOAL MET 07/17/2022  3.  Pt will improve TUG score to less  than or equal to 13.5  sec for decreased fall risk. Baseline: 17.31>11.06 sec 07/17/2022 Goal status: GOAL MET  4.  Pt will improve MiniBESTest score to at least 22/28 to decrease fall risk. Baseline: 16/28 Goal status:IN PROGRESS  5.  Pt will verbalize understanding of local Parkinson's disease community resources.  Baseline:  Goal status: IN PROGRESS    ASSESSMENT:  CLINICAL IMPRESSION: Pt arrives today reporting long day from testing at [place] yesterday.  She does also report wearing her favorite Neurosurgeon) shoes, which she feels helps her balance.  Reviewed all positions of PWR! Moves added to HEP last visit, with pt return demo good understanding.  Assessed STGs, with pt meeting all 3 of 3 STGs.  She has improved 5x sit<>stand and TUG scores and met LTG 2 and 3.  She feels she is doing well and discussed possibility of finishing therapy visits next week.  Will  assess further LTGs and make sure pt is comfortable with her HEP.    OBJECTIVE IMPAIRMENTS Abnormal gait, decreased balance, decreased mobility, difficulty walking, and decreased strength.   ACTIVITY LIMITATIONS standing, transfers, and locomotion level  PARTICIPATION LIMITATIONS: community activity  PERSONAL FACTORS 3+ comorbidities: see above for PMH, but complex orthopedic history and pain  are also affecting patient's functional outcome. Also, pt has further brain scan, blood work testing to be completed.  REHAB POTENTIAL: Good  CLINICAL DECISION MAKING: Evolving/moderate complexity  EVALUATION COMPLEXITY: Moderate  PLAN: PT FREQUENCY: 2x/week  PT DURATION: 6 weeks plus eval  PLANNED INTERVENTIONS: Therapeutic exercises, Therapeutic activity, Neuromuscular re-education, Balance training, Gait training, Patient/Family education, and Self Care  PLAN FOR NEXT SESSION: Check LTGs and discuss POC (may consider holding chart open prior to formal discharge in case pt has any further concerns in next month.)  Discuss  and schedule return PT screen.  Frazier Butt., PT 07/17/2022, 12:16 PM   [facility] at [facility], [address] [place], [place] [address] Phone # [phone] Fax # [phone]

## 2022-07-21 ENCOUNTER — Encounter: Payer: Self-pay | Admitting: Physical Therapy

## 2022-07-21 ENCOUNTER — Ambulatory Visit: Payer: Medicare Other | Admitting: Physical Therapy

## 2022-07-21 DIAGNOSIS — M6281 Muscle weakness (generalized): Secondary | ICD-10-CM | POA: Diagnosis not present

## 2022-07-21 DIAGNOSIS — R2689 Other abnormalities of gait and mobility: Secondary | ICD-10-CM

## 2022-07-21 DIAGNOSIS — F4329 Adjustment disorder with other symptoms: Secondary | ICD-10-CM | POA: Diagnosis not present

## 2022-07-21 DIAGNOSIS — R2681 Unsteadiness on feet: Secondary | ICD-10-CM | POA: Diagnosis not present

## 2022-07-21 NOTE — Therapy (Signed)
OUTPATIENT PHYSICAL THERAPY NEURO TREATMENT NOTE  Patient Name: Felicia Cain MRN: [id number] DOB:08-Oct-1955, 66 y.o., female Today's Date: 07/21/2022   PCP: Lajean Manes, MD REFERRING PROVIDER: Lezlie Octave, MD       PT End of Session - 07/21/22 0857     Visit Number 8    Number of Visits 13   BF Neuro POC   Date for PT Re-Evaluation 08/07/22    Authorization Type [facility]    Authorization - Visit Number 13   total with [facility] as of 07/21/22; 5 previously used at [place] upon [facility] eval 06/24/2022   Progress Note Due on Visit --   completed 07/09/2022 (due to combined visits from [facility] [facility] and [place])   PT Start Time [id number]   PT starting late from previous patient   PT Stop Time 0929    PT Time Calculation (min) 33 min    Activity Tolerance Patient tolerated treatment well    Behavior During Therapy [facility] for tasks assessed/performed                Past Medical History:  Diagnosis Date   Anxiety    Arthritis    "back" (01/25/2017)   Brachial neuritis    neuropathy   Chronic lower back pain    DCIS (ductal carcinoma in situ)    "left side?"   Esophageal stricture    stricture with dysphasia   Fibromyalgia    GERD (gastroesophageal reflux disease)    H/O Doppler ultrasound 2006   see scanned study   H/O echocardiogram 2004, 2013   Hepatitis A 1961   "epidemic in my city"   History of cardiac monitoring 2006   History of nuclear stress test 2004   see scanned study   Migraine    "in the past; cycle related" (01/25/2017)   Myasthenia gravis in crisis ([place]) 04/2014   respiratory crisis   NAION (non-arteritic anterior ischemic optic neuropathy), right    Near syncope    Neuromuscular disorder (HCC)    Neuropathy    Optic neuritis    ischaemic optic neuritis, non arteric   Optic neuropathy    PONV (postoperative nausea and vomiting)    Posterior optic neuritis    Ptosis of eyelid    Sleep apnea    "I wear Moses  device; I don't wear CPAP" (01/25/2017)   Spinal stenosis    Past Surgical History:  Procedure Laterality Date   AUGMENTATION MAMMAPLASTY     2000, after breast cancer surgery,redone 2010   BACK SURGERY     BREAST BIOPSY     BREAST CAPSULECTOMY WITH IMPLANT EXCHANGE Bilateral 09/28/2018   Procedure: REMOVAL OF BILATERAL BREAST IMPLANTS WITH CAPSULECTOMIES AND REPLACEMENTS OF IMPLANTS;  Surgeon: Wallace Going, DO;  Location: [place];  Service: Plastics;  Laterality: Bilateral;   CARPAL TUNNEL RELEASE Bilateral    CESAREAN SECTION  1989   ESOPHAGOGASTRODUODENOSCOPY (EGD) WITH ESOPHAGEAL DILATION     HIP PINNING,CANNULATED Right 01/26/2017   Procedure: CANNULATED SCREWS/RIGHT HIP PINNING;  Surgeon: Mcarthur Rossetti, MD;  Location: [place];  Service: Orthopedics;  Laterality: Right;   INNER EAR SURGERY Right 1995   LUMBAR LAMINECTOMY Right 1999   [name]   MASTECTOMY Bilateral    Patient Active Problem List   Diagnosis Date Noted   Pain in left elbow 10/21/2021   Pain in right elbow 10/21/2021   Tremor of right hand 07/23/2021   Titubation 07/23/2021   Weakness of both  lower extremities 01/20/2021   Gait instability 01/20/2021   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 ([place]) 09/20/2019   Peroneal neuropathy at knee, left 03/19/2019   Patellar contusion, left, sequela 02/09/2019   Rib pain on right side 11/24/2018   Acquired absence of both breasts 09/20/2018   Ruptured right breast implant 08/26/2018   History of reconstruction of both breasts 07/08/2018   Breast implant capsular contracture 07/08/2018   Breast pain 07/08/2018   Retrognathia 09/15/2017   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   Status post-operative repair of closed fracture of right hip 09/08/2017   Acute pain of right knee 05/24/2017   Hip fracture ([place]) 01/25/2017   Closed hip fracture, right, initial encounter ([place])    Preop examination     Fibromyalgia    Neuropathy    Corneal abrasion    Nausea    Fall    Seronegative myasthenia gravis ([place]) 07/19/2015   Acute exacerbation of chronic low back pain 07/19/2015   Leukocytosis 07/19/2015   Myasthenia gravis in remission ([place]) 10/23/2014   OSA on CPAP 10/23/2014   Hypersomnia, persistent 10/23/2014   Hypoxemia 08/14/2014   Myasthenia gravis with exacerbation ([place]) 08/14/2014   Myasthenia gravis with exacerbation, ocular ([place]) 12/08/2012   Right shoulder pain 08/02/2012   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   History of optic neuritis 06/27/2012   Rosacea 06/27/2012   S/P laminectomy 06/27/2012   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   S/P bilateral mastectomy 06/26/2012   BRCA negative 06/26/2012   GERD (gastroesophageal reflux disease) 06/26/2012   Menopause 06/26/2012   Ankle pain 06/02/2012    ONSET DATE: 06/17/2022 (MD referral)  REFERRING DIAG: G20.A1 (ICD-10-CM) - Parkinson's disease without dyskinesia, without mention of fluctuations  THERAPY DIAG:  Unsteadiness on feet  Other abnormalities of gait and mobility  Muscle weakness (generalized)  Rationale for Evaluation and Treatment Rehabilitation  SUBJECTIVE:                                                                                                                                                                                              SUBJECTIVE STATEMENT: Sometimes turning in the kitchen is hard.   Pt accompanied by: self  PERTINENT HISTORY: Parkinson's dx 01/2022 (not on medication); PMH includes cancer, Myasthenia gravis, lumbar lami/discectomy.  Dequervain's R hand, psoriatic arthritis, R hip surgery due to hip fracture  PAIN:  Are you having pain? Yes: NPRS scale: 4-5/10 Pain location: generalized Pain description: achy Aggravating factors: woke up with pain Relieving factors: movement/stretching.     PRECAUTIONS: Fall and Other: wearing R wrist brace  after injections  (followed by Dr. Fredna Dow)  WEIGHT BEARING RESTRICTIONS No  FALLS: Has patient fallen in last 6 months? No and near fall in late September  LIVING ENVIRONMENT: Lives with: lives with their family and lives with mother Lives in: House/apartment Stairs: Yes: External: 3 steps; on right going up Has following equipment at home: Single point cane and Walker - 2 wheeled  PLOF: Independent and Vocation/Vocational requirements: part-time job; does yoga and other regular exercise  PATIENT GOALS Pt's goals are to not trip over my foot and to lessen the spasms in L upper thigh  OBJECTIVE:    TODAY'S TREATMENT: 07/21/2022 Activity Comments  5x:  15.13 sec Improved from 19.66 sec  TUG:  10.22 sec TUG cognitive:  9.66   MiniBESTest:  25/28 Improved from 16/28  Discussed/practiced functional movements in daily activities associated with standing PWR! Moves exercise   Gait velocity:  10.10 sec= 3.25 ft/sec        [facility] PT Assessment - 07/21/22 0001       Standardized Balance Assessment   Standardized Balance Assessment Mini-BESTest      Mini-BESTest   Sit To Stand Normal: Comes to stand without use of hands and stabilizes independently.    Rise to Toes Normal: Stable for 3 s with maximum height.    Stand on one leg (left) Moderate: < 20 s   14.44, 10.84   Stand on one leg (right) Moderate: < 20 s   8.94, 6.53   Stand on one leg - lowest score 1    Compensatory Stepping Correction - Forward Normal: Recovers independently with a single, large step (second realignement is allowed).    Compensatory Stepping Correction - Backward Moderate: More than one step is required to recover equilibrium    Compensatory Stepping Correction - Left Lateral Normal: Recovers independently with 1 step (crossover or lateral OK)    Compensatory Stepping Correction - Right Lateral Normal: Recovers independently with 1 step (crossover or lateral OK)    Stepping Corredtion Lateral - lowest score 2    Stance - Feet  together, eyes open, firm surface  Normal: 30s    Stance - Feet together, eyes closed, foam surface  Normal: 30s    Incline - Eyes Closed Normal: Stands independently 30s and aligns with gravity    Change in Gait Speed Normal: Significantly changes walkling speed without imbalance    Walk with head turns - Horizontal Normal: performs head turns with no change in gait speed and good balance    Walk with pivot turns Normal: Turns with feet close FAST (< 3 steps) with good balance.    Step over obstacles Moderate: Steps over box but touches box OR displays cautious behavior by slowing gait.    Timed UP & GO with Dual Task Normal: No noticeable change in sitting, standing or walking while backward counting when compared to TUG without    Mini-BEST total score 25               PATIENT EDUCATION: Education details: POC and progress towards goals; plans to continue HEP, community fitness-Tai Chi.  Discussed holding chart open, but holding visits for 30 days and then d/c if pt has no further needs.  Pt in agreement with follow-up screen in 6-9 months. Person educated: Patient Education method: Explanation Education comprehension: verbalized understanding   Access Code: [email or web address] URL: [email or web address] Date: 06/30/2022>07/14/2022 Prepared by: [facility]  Program Notes Wall bumps:  Stand  with your back about 4-6 inches from a wall.  Stick your bottom out to touch the wall (your head will come forward), then bring your hips forward to your start position.   Repeat  Exercises - Standing Lumbar Spine Flexion Stretch Counter  - 1-2 x daily - 7 x weekly - 1 sets - 3 reps - 15 sec hold - Seated Hamstring Stretch  - 1-2 x daily - 7 x weekly - 1 sets - 3 reps - 30 sec hold - Staggered Stance Forward Backward Weight Shift with Counter Support  - 1 x daily - 7 x weekly - 2 sets - 10 reps - Alternating Step forward-Balance Reaction  - 1 x daily -  5 x weekly - 2 sets - 10 reps - Alternating Step Backward with Support  - 1 x daily - 5 x weekly - 2 sets - 10 reps *PWR! Moves standing/Sitting/Modified [place]! Up, Rock, Step, 2 sets of 10, at counter, 2x/day  ------------------------------------------------------------------------------ (Measures below from evaluation findings): DIAGNOSTIC FINDINGS: NA at this time-awaiting brain MRI and DaT scan  COGNITION: Overall cognitive status: Within functional limits for tasks assessed   SENSATION: WFL to light touch   MUSCLE TONE: WFL to passive ROM BLEs   POSTURE: No Significant postural limitations  LOWER EXTREMITY ROM:   WFL, however some limitations with twitching motion RLE with attempts at full knee extension  Active  Right Eval Left Eval  Hip flexion    Hip extension    Hip abduction    Hip adduction    Hip internal rotation    Hip external rotation    Knee flexion    Knee extension    Ankle dorsiflexion    Ankle plantarflexion    Ankle inversion    Ankle eversion     (Blank rows = not tested)  LOWER EXTREMITY MMT:    MMT Right Eval Left Eval  Hip flexion 4 5  Hip extension    Hip abduction    Hip adduction    Hip internal rotation    Hip external rotation    Knee flexion 4+ 5  Knee extension 4 5  Ankle dorsiflexion 5 5  Ankle plantarflexion    Ankle inversion    Ankle eversion    (Blank rows = not tested)   TRANSFERS: Assistive device utilized: None  Sit to stand: SBA  Pt has hands on knees and has increased forward lean, increased time to push up to stand Stand to sit: SBA  GAIT: Gait pattern:  fasiculations, spasm/tremor in RLE with fatigue, step through pattern, decreased arm swing- Right, decreased arm swing- Left, decreased step length- Right, and poor foot clearance- Right Distance walked: 40 ft x 4 reps Assistive device utilized: None Level of assistance: Modified independence and SBA Comments: Pt c/o some pain in R  hip  FUNCTIONAL TESTs:  5 times sit to stand: 39.97 sec starting with hands at chest, then hands on knees Timed up and go (TUG): 17.31 sec TUG cognitive:  13.96 sec MiniBESTest:  16/28 (scores <22/28 indicate increased fall risk)  [facility] PT Assessment - 07/21/22 0001       Standardized Balance Assessment   Standardized Balance Assessment Mini-BESTest      Mini-BESTest   Sit To Stand Normal: Comes to stand without use of hands and stabilizes independently.    Rise to Toes Normal: Stable for 3 s with maximum height.    Stand on one leg (left) Moderate: < 20 s  14.44, 10.84   Stand on one leg (right) Moderate: < 20 s   8.94, 6.53   Stand on one leg - lowest score 1    Compensatory Stepping Correction - Forward Normal: Recovers independently with a single, large step (second realignement is allowed).    Compensatory Stepping Correction - Backward Moderate: More than one step is required to recover equilibrium    Compensatory Stepping Correction - Left Lateral Normal: Recovers independently with 1 step (crossover or lateral OK)    Compensatory Stepping Correction - Right Lateral Normal: Recovers independently with 1 step (crossover or lateral OK)    Stepping Corredtion Lateral - lowest score 2    Stance - Feet together, eyes open, firm surface  Normal: 30s    Stance - Feet together, eyes closed, foam surface  Normal: 30s    Incline - Eyes Closed Normal: Stands independently 30s and aligns with gravity    Change in Gait Speed Normal: Significantly changes walkling speed without imbalance    Walk with head turns - Horizontal Normal: performs head turns with no change in gait speed and good balance    Walk with pivot turns Normal: Turns with feet close FAST (< 3 steps) with good balance.    Step over obstacles Moderate: Steps over box but touches box OR displays cautious behavior by slowing gait.    Timed UP & GO with Dual Task Normal: No noticeable change in sitting, standing or walking while  backward counting when compared to TUG without    Mini-BEST total score 25                PATIENT EDUCATION: Education details: Eval results, POC Person educated: Patient Education method: Explanation Education comprehension: verbalized understanding   HOME EXERCISE PROGRAM: Not yet initiated    GOALS: Goals reviewed with patient? Yes  SHORT TERM GOALS: Target date: 07/24/2022  Pt will be independent with HEP for improved strength, balance, gait. Baseline: Goal status: GOAL MET  2.  Pt will improve 5x sit<>stand to less than or equal to 30 sec to demonstrate improved functional strength and transfer efficiency. Baseline: 39.97 sec>19.66 sec 07/17/2022 Goal status: GOAL MET  3.  Pt will perform posterior push and release and anterior push and release test in 2 steps or less for improved balance recovery. Baseline: multiple steps, would fall if unaided in posterior direction>1 step in anterior and 1 step in posterior position 07/17/2022 Goal status: GOAL MET  LONG TERM GOALS: Target date: 08/07/2022  Pt will be independent with HEP for improved strength, balance, transfers, and gait. Baseline: demo understanding of PWR! Moves  Goal status: GOAL MET  2.  Pt will improve 5x sit<>stand to less than or equal to 20 sec to demonstrate improved functional strength and transfer efficiency. Baseline: 39.97 sec Goal status: GOAL MET 07/17/2022  3.  Pt will improve TUG score to less than or equal to 13.5  sec for decreased fall risk. Baseline: 17.31>11.06 sec 07/17/2022 Goal status: GOAL MET  4.  Pt will improve MiniBESTest score to at least 22/28 to decrease fall risk. Baseline: 16/28> 25/28 07/21/2022 Goal status:GOAL MET  5.  Pt will verbalize understanding of local Parkinson's disease community resources.  Baseline: information provided Goal status: GOAL MET, 07/21/2022    ASSESSMENT:  CLINICAL IMPRESSION: Assessed remaining LTGs today and reassessed 5x  sit<>stand and TUG measures today, with more regular sneakers on compared to her favorite shoes from last visit.  Pt continues to demo improved overall functional  mobility measures with improved 5x sit<>stand, TUG scores and MiniBESTest scores compared to evaluation.  She has no incidence of tremuluous movement patterns in PT session today.  She has demonstrated through the course of therapy improved balance reactions and postural stability on compliant surfaces.  She has met 5 of 5 LTGs.  She is performing PWR! Moves consistently at home and [place] in addition to her other stretching exercise routines.  She appears to be appropriate for discharge, but she wants to hold chart open for 30 days in the event she has balance changes during that time to return to PT.  Otherwise, will plan to discharge formally at that time.  Recommend return PT screen in 6-9 months.   OBJECTIVE IMPAIRMENTS Abnormal gait, decreased balance, decreased mobility, difficulty walking, and decreased strength.   ACTIVITY LIMITATIONS standing, transfers, and locomotion level  PARTICIPATION LIMITATIONS: community activity  PERSONAL FACTORS 3+ comorbidities: see above for PMH, but complex orthopedic history and pain  are also affecting patient's functional outcome. Also, pt has further brain scan, blood work testing to be completed.  REHAB POTENTIAL: Good  CLINICAL DECISION MAKING: Evolving/moderate complexity  EVALUATION COMPLEXITY: Moderate  PLAN: PT FREQUENCY: 2x/week  PT DURATION: 6 weeks plus eval  PLANNED INTERVENTIONS: Therapeutic exercises, Therapeutic activity, Neuromuscular re-education, Balance training, Gait training, Patient/Family education, and Self Care  PLAN FOR NEXT SESSION: Hold chart open for 30 days; then if pt has not needed to return, plan for discharge at that time.  Plan for return PT screen in 6-9 months.  Frazier Butt., PT 07/21/2022, 5:51 PM   [facility] at [facility] [address] [place], [place], [place] [address] Phone # [phone] Fax # [phone]

## 2022-07-23 ENCOUNTER — Ambulatory Visit: Payer: Medicare Other | Admitting: Neurology

## 2022-07-23 ENCOUNTER — Ambulatory Visit: Payer: Medicare Other | Admitting: Physical Therapy

## 2022-07-28 DIAGNOSIS — F4329 Adjustment disorder with other symptoms: Secondary | ICD-10-CM | POA: Diagnosis not present

## 2022-08-05 DIAGNOSIS — M654 Radial styloid tenosynovitis [de Quervain]: Secondary | ICD-10-CM | POA: Diagnosis not present

## 2022-08-09 NOTE — Therapy (Deleted)
OUTPATIENT PHYSICAL THERAPY NEURO TREATMENT NOTE  Patient Name: Felicia Cain MRN: [id number] DOB:08-Oct-1955, 66 y.o., female Today's Date: 07/21/2022   PCP: Lajean Manes, MD REFERRING PROVIDER: Lezlie Octave, MD       PT End of Session - 07/21/22 0857     Visit Number 8    Number of Visits 13   BF Neuro POC   Date for PT Re-Evaluation 08/07/22    Authorization Type [facility]    Authorization - Visit Number 13   total with [facility] as of 07/21/22; 5 previously used at [place] upon [facility] eval 06/24/2022   Progress Note Due on Visit --   completed 07/09/2022 (due to combined visits from [place] [facility] and [place])   PT Start Time [id number]   PT starting late from previous patient   PT Stop Time 0929    PT Time Calculation (min) 33 min    Activity Tolerance Patient tolerated treatment well    Behavior During Therapy [facility] for tasks assessed/performed                Past Medical History:  Diagnosis Date   Anxiety    Arthritis    "back" (01/25/2017)   Brachial neuritis    neuropathy   Chronic lower back pain    DCIS (ductal carcinoma in situ)    "left side?"   Esophageal stricture    stricture with dysphasia   Fibromyalgia    GERD (gastroesophageal reflux disease)    H/O Doppler ultrasound 2006   see scanned study   H/O echocardiogram 2004, 2013   Hepatitis A 1961   "epidemic in my city"   History of cardiac monitoring 2006   History of nuclear stress test 2004   see scanned study   Migraine    "in the past; cycle related" (01/25/2017)   Myasthenia gravis in crisis ([place]) 04/2014   respiratory crisis   NAION (non-arteritic anterior ischemic optic neuropathy), right    Near syncope    Neuromuscular disorder (HCC)    Neuropathy    Optic neuritis    ischaemic optic neuritis, non arteric   Optic neuropathy    PONV (postoperative nausea and vomiting)    Posterior optic neuritis    Ptosis of eyelid    Sleep apnea    "I wear Moses  device; I don't wear CPAP" (01/25/2017)   Spinal stenosis    Past Surgical History:  Procedure Laterality Date   AUGMENTATION MAMMAPLASTY     2000, after breast cancer surgery,redone 2010   BACK SURGERY     BREAST BIOPSY     BREAST CAPSULECTOMY WITH IMPLANT EXCHANGE Bilateral 09/28/2018   Procedure: REMOVAL OF BILATERAL BREAST IMPLANTS WITH CAPSULECTOMIES AND REPLACEMENTS OF IMPLANTS;  Surgeon: Wallace Going, DO;  Location: [place];  Service: Plastics;  Laterality: Bilateral;   CARPAL TUNNEL RELEASE Bilateral    CESAREAN SECTION  1989   ESOPHAGOGASTRODUODENOSCOPY (EGD) WITH ESOPHAGEAL DILATION     HIP PINNING,CANNULATED Right 01/26/2017   Procedure: CANNULATED SCREWS/RIGHT HIP PINNING;  Surgeon: Mcarthur Rossetti, MD;  Location: [place];  Service: Orthopedics;  Laterality: Right;   INNER EAR SURGERY Right 1995   LUMBAR LAMINECTOMY Right 1999   [name]   MASTECTOMY Bilateral    Patient Active Problem List   Diagnosis Date Noted   Pain in left elbow 10/21/2021   Pain in right elbow 10/21/2021   Tremor of right hand 07/23/2021   Titubation 07/23/2021   Weakness of both  lower extremities 01/20/2021   Gait instability 01/20/2021   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 ([place]) 09/20/2019   Peroneal neuropathy at knee, left 03/19/2019   Patellar contusion, left, sequela 02/09/2019   Rib pain on right side 11/24/2018   Acquired absence of both breasts 09/20/2018   Ruptured right breast implant 08/26/2018   History of reconstruction of both breasts 07/08/2018   Breast implant capsular contracture 07/08/2018   Breast pain 07/08/2018   Retrognathia 09/15/2017   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   Status post-operative repair of closed fracture of right hip 09/08/2017   Acute pain of right knee 05/24/2017   Hip fracture ([place]) 01/25/2017   Closed hip fracture, right, initial encounter ([place])    Preop examination     Fibromyalgia    Neuropathy    Corneal abrasion    Nausea    Fall    Seronegative myasthenia gravis ([place]) 07/19/2015   Acute exacerbation of chronic low back pain 07/19/2015   Leukocytosis 07/19/2015   Myasthenia gravis in remission ([place]) 10/23/2014   OSA on CPAP 10/23/2014   Hypersomnia, persistent 10/23/2014   Hypoxemia 08/14/2014   Myasthenia gravis with exacerbation ([place]) 08/14/2014   Myasthenia gravis with exacerbation, ocular ([place]) 12/08/2012   Right shoulder pain 08/02/2012   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   History of optic neuritis 06/27/2012   Rosacea 06/27/2012   S/P laminectomy 06/27/2012   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   S/P bilateral mastectomy 06/26/2012   BRCA negative 06/26/2012   GERD (gastroesophageal reflux disease) 06/26/2012   Menopause 06/26/2012   Ankle pain 06/02/2012    ONSET DATE: 06/17/2022 (MD referral)  REFERRING DIAG: G20.A1 (ICD-10-CM) - Parkinson's disease without dyskinesia, without mention of fluctuations  THERAPY DIAG:  Unsteadiness on feet  Other abnormalities of gait and mobility  Muscle weakness (generalized)  Rationale for Evaluation and Treatment Rehabilitation  SUBJECTIVE:                                                                                                                                                                                              SUBJECTIVE STATEMENT: Sometimes turning in the kitchen is hard.   Pt accompanied by: self  PERTINENT HISTORY: Parkinson's dx 01/2022 (not on medication); PMH includes cancer, Myasthenia gravis, lumbar lami/discectomy.  Dequervain's R hand, psoriatic arthritis, R hip surgery due to hip fracture  PAIN:  Are you having pain? Yes: NPRS scale: 4-5/10 Pain location: generalized Pain description: achy Aggravating factors: woke up with pain Relieving factors: movement/stretching.     PRECAUTIONS: Fall and Other: wearing R wrist brace  after injections  (followed by Dr. Fredna Dow)  WEIGHT BEARING RESTRICTIONS No  FALLS: Has patient fallen in last 6 months? No and near fall in late September  LIVING ENVIRONMENT: Lives with: lives with their family and lives with mother Lives in: House/apartment Stairs: Yes: External: 3 steps; on right going up Has following equipment at home: Single point cane and Walker - 2 wheeled  PLOF: Independent and Vocation/Vocational requirements: part-time job; does yoga and other regular exercise  PATIENT GOALS Pt's goals are to not trip over my foot and to lessen the spasms in L upper thigh  OBJECTIVE:    TODAY'S TREATMENT: 07/21/2022 Activity Comments  5x:  15.13 sec Improved from 19.66 sec  TUG:  10.22 sec TUG cognitive:  9.66   MiniBESTest:  25/28 Improved from 16/28  Discussed/practiced functional movements in daily activities associated with standing PWR! Moves exercise   Gait velocity:  10.10 sec= 3.25 ft/sec        [facility] PT Assessment - 07/21/22 0001       Standardized Balance Assessment   Standardized Balance Assessment Mini-BESTest      Mini-BESTest   Sit To Stand Normal: Comes to stand without use of hands and stabilizes independently.    Rise to Toes Normal: Stable for 3 s with maximum height.    Stand on one leg (left) Moderate: < 20 s   14.44, 10.84   Stand on one leg (right) Moderate: < 20 s   8.94, 6.53   Stand on one leg - lowest score 1    Compensatory Stepping Correction - Forward Normal: Recovers independently with a single, large step (second realignement is allowed).    Compensatory Stepping Correction - Backward Moderate: More than one step is required to recover equilibrium    Compensatory Stepping Correction - Left Lateral Normal: Recovers independently with 1 step (crossover or lateral OK)    Compensatory Stepping Correction - Right Lateral Normal: Recovers independently with 1 step (crossover or lateral OK)    Stepping Corredtion Lateral - lowest score 2    Stance - Feet  together, eyes open, firm surface  Normal: 30s    Stance - Feet together, eyes closed, foam surface  Normal: 30s    Incline - Eyes Closed Normal: Stands independently 30s and aligns with gravity    Change in Gait Speed Normal: Significantly changes walkling speed without imbalance    Walk with head turns - Horizontal Normal: performs head turns with no change in gait speed and good balance    Walk with pivot turns Normal: Turns with feet close FAST (< 3 steps) with good balance.    Step over obstacles Moderate: Steps over box but touches box OR displays cautious behavior by slowing gait.    Timed UP & GO with Dual Task Normal: No noticeable change in sitting, standing or walking while backward counting when compared to TUG without    Mini-BEST total score 25               PATIENT EDUCATION: Education details: POC and progress towards goals; plans to continue HEP, community fitness-Tai Chi.  Discussed holding chart open, but holding visits for 30 days and then d/c if pt has no further needs.  Pt in agreement with follow-up screen in 6-9 months. Person educated: Patient Education method: Explanation Education comprehension: verbalized understanding   Access Code: [email or web address] URL: [email or web address] Date: 06/30/2022>07/14/2022 Prepared by: [facility]  Program Notes Wall bumps:  Stand  with your back about 4-6 inches from a wall.  Stick your bottom out to touch the wall (your head will come forward), then bring your hips forward to your start position.   Repeat  Exercises - Standing Lumbar Spine Flexion Stretch Counter  - 1-2 x daily - 7 x weekly - 1 sets - 3 reps - 15 sec hold - Seated Hamstring Stretch  - 1-2 x daily - 7 x weekly - 1 sets - 3 reps - 30 sec hold - Staggered Stance Forward Backward Weight Shift with Counter Support  - 1 x daily - 7 x weekly - 2 sets - 10 reps - Alternating Step forward-Balance Reaction  - 1 x daily -  5 x weekly - 2 sets - 10 reps - Alternating Step Backward with Support  - 1 x daily - 5 x weekly - 2 sets - 10 reps *PWR! Moves standing/Sitting/Modified [place]! Up, Rock, Step, 2 sets of 10, at counter, 2x/day  ------------------------------------------------------------------------------ (Measures below from evaluation findings): DIAGNOSTIC FINDINGS: NA at this time-awaiting brain MRI and DaT scan  COGNITION: Overall cognitive status: Within functional limits for tasks assessed   SENSATION: WFL to light touch   MUSCLE TONE: WFL to passive ROM BLEs   POSTURE: No Significant postural limitations  LOWER EXTREMITY ROM:   WFL, however some limitations with twitching motion RLE with attempts at full knee extension  Active  Right Eval Left Eval  Hip flexion    Hip extension    Hip abduction    Hip adduction    Hip internal rotation    Hip external rotation    Knee flexion    Knee extension    Ankle dorsiflexion    Ankle plantarflexion    Ankle inversion    Ankle eversion     (Blank rows = not tested)  LOWER EXTREMITY MMT:    MMT Right Eval Left Eval  Hip flexion 4 5  Hip extension    Hip abduction    Hip adduction    Hip internal rotation    Hip external rotation    Knee flexion 4+ 5  Knee extension 4 5  Ankle dorsiflexion 5 5  Ankle plantarflexion    Ankle inversion    Ankle eversion    (Blank rows = not tested)   TRANSFERS: Assistive device utilized: None  Sit to stand: SBA  Pt has hands on knees and has increased forward lean, increased time to push up to stand Stand to sit: SBA  GAIT: Gait pattern:  fasiculations, spasm/tremor in RLE with fatigue, step through pattern, decreased arm swing- Right, decreased arm swing- Left, decreased step length- Right, and poor foot clearance- Right Distance walked: 40 ft x 4 reps Assistive device utilized: None Level of assistance: Modified independence and SBA Comments: Pt c/o some pain in R  hip  FUNCTIONAL TESTs:  5 times sit to stand: 39.97 sec starting with hands at chest, then hands on knees Timed up and go (TUG): 17.31 sec TUG cognitive:  13.96 sec MiniBESTest:  16/28 (scores <22/28 indicate increased fall risk)  [facility] PT Assessment - 07/21/22 0001       Standardized Balance Assessment   Standardized Balance Assessment Mini-BESTest      Mini-BESTest   Sit To Stand Normal: Comes to stand without use of hands and stabilizes independently.    Rise to Toes Normal: Stable for 3 s with maximum height.    Stand on one leg (left) Moderate: < 20 s  14.44, 10.84   Stand on one leg (right) Moderate: < 20 s   8.94, 6.53   Stand on one leg - lowest score 1    Compensatory Stepping Correction - Forward Normal: Recovers independently with a single, large step (second realignement is allowed).    Compensatory Stepping Correction - Backward Moderate: More than one step is required to recover equilibrium    Compensatory Stepping Correction - Left Lateral Normal: Recovers independently with 1 step (crossover or lateral OK)    Compensatory Stepping Correction - Right Lateral Normal: Recovers independently with 1 step (crossover or lateral OK)    Stepping Corredtion Lateral - lowest score 2    Stance - Feet together, eyes open, firm surface  Normal: 30s    Stance - Feet together, eyes closed, foam surface  Normal: 30s    Incline - Eyes Closed Normal: Stands independently 30s and aligns with gravity    Change in Gait Speed Normal: Significantly changes walkling speed without imbalance    Walk with head turns - Horizontal Normal: performs head turns with no change in gait speed and good balance    Walk with pivot turns Normal: Turns with feet close FAST (< 3 steps) with good balance.    Step over obstacles Moderate: Steps over box but touches box OR displays cautious behavior by slowing gait.    Timed UP & GO with Dual Task Normal: No noticeable change in sitting, standing or walking while  backward counting when compared to TUG without    Mini-BEST total score 25                PATIENT EDUCATION: Education details: Eval results, POC Person educated: Patient Education method: Explanation Education comprehension: verbalized understanding   HOME EXERCISE PROGRAM: Not yet initiated    GOALS: Goals reviewed with patient? Yes  SHORT TERM GOALS: Target date: 07/24/2022  Pt will be independent with HEP for improved strength, balance, gait. Baseline: Goal status: GOAL MET  2.  Pt will improve 5x sit<>stand to less than or equal to 30 sec to demonstrate improved functional strength and transfer efficiency. Baseline: 39.97 sec>19.66 sec 07/17/2022 Goal status: GOAL MET  3.  Pt will perform posterior push and release and anterior push and release test in 2 steps or less for improved balance recovery. Baseline: multiple steps, would fall if unaided in posterior direction>1 step in anterior and 1 step in posterior position 07/17/2022 Goal status: GOAL MET  LONG TERM GOALS: Target date: 08/07/2022  Pt will be independent with HEP for improved strength, balance, transfers, and gait. Baseline: demo understanding of PWR! Moves  Goal status: GOAL MET  2.  Pt will improve 5x sit<>stand to less than or equal to 20 sec to demonstrate improved functional strength and transfer efficiency. Baseline: 39.97 sec Goal status: GOAL MET 07/17/2022  3.  Pt will improve TUG score to less than or equal to 13.5  sec for decreased fall risk. Baseline: 17.31>11.06 sec 07/17/2022 Goal status: GOAL MET  4.  Pt will improve MiniBESTest score to at least 22/28 to decrease fall risk. Baseline: 16/28> 25/28 07/21/2022 Goal status:GOAL MET  5.  Pt will verbalize understanding of local Parkinson's disease community resources.  Baseline: information provided Goal status: GOAL MET, 07/21/2022    ASSESSMENT:  CLINICAL IMPRESSION: Assessed remaining LTGs today and reassessed 5x  sit<>stand and TUG measures today, with more regular sneakers on compared to her favorite shoes from last visit.  Pt continues to demo improved overall functional  mobility measures with improved 5x sit<>stand, TUG scores and MiniBESTest scores compared to evaluation.  She has no incidence of tremuluous movement patterns in PT session today.  She has demonstrated through the course of therapy improved balance reactions and postural stability on compliant surfaces.  She has met 5 of 5 LTGs.  She is performing PWR! Moves consistently at home and [place] in addition to her other stretching exercise routines.  She appears to be appropriate for discharge, but she wants to hold chart open for 30 days in the event she has balance changes during that time to return to PT.  Otherwise, will plan to discharge formally at that time.  Recommend return PT screen in 6-9 months.   OBJECTIVE IMPAIRMENTS Abnormal gait, decreased balance, decreased mobility, difficulty walking, and decreased strength.   ACTIVITY LIMITATIONS standing, transfers, and locomotion level  PARTICIPATION LIMITATIONS: community activity  PERSONAL FACTORS 3+ comorbidities: see above for PMH, but complex orthopedic history and pain  are also affecting patient's functional outcome. Also, pt has further brain scan, blood work testing to be completed.  REHAB POTENTIAL: Good  CLINICAL DECISION MAKING: Evolving/moderate complexity  EVALUATION COMPLEXITY: Moderate  PLAN: PT FREQUENCY: 2x/week  PT DURATION: 6 weeks plus eval  PLANNED INTERVENTIONS: Therapeutic exercises, Therapeutic activity, Neuromuscular re-education, Balance training, Gait training, Patient/Family education, and Self Care  PLAN FOR NEXT SESSION: Hold chart open for 30 days; then if pt has not needed to return, plan for discharge at that time.  Plan for return PT screen in 6-9 months.  Cortez Steelman Tharon Aquas) [place] MPT

## 2022-08-10 ENCOUNTER — Ambulatory Visit (HOSPITAL_BASED_OUTPATIENT_CLINIC_OR_DEPARTMENT_OTHER): Payer: PRIVATE HEALTH INSURANCE | Admitting: Physical Therapy

## 2022-08-11 DIAGNOSIS — R52 Pain, unspecified: Secondary | ICD-10-CM | POA: Diagnosis not present

## 2022-08-12 DIAGNOSIS — N952 Postmenopausal atrophic vaginitis: Secondary | ICD-10-CM | POA: Diagnosis not present

## 2022-08-12 DIAGNOSIS — R350 Frequency of micturition: Secondary | ICD-10-CM | POA: Diagnosis not present

## 2022-08-12 DIAGNOSIS — N3941 Urge incontinence: Secondary | ICD-10-CM | POA: Diagnosis not present

## 2022-08-12 DIAGNOSIS — R3915 Urgency of urination: Secondary | ICD-10-CM | POA: Diagnosis not present

## 2022-08-17 DIAGNOSIS — F4329 Adjustment disorder with other symptoms: Secondary | ICD-10-CM | POA: Diagnosis not present

## 2022-08-18 DIAGNOSIS — M654 Radial styloid tenosynovitis [de Quervain]: Secondary | ICD-10-CM | POA: Diagnosis not present

## 2022-08-18 DIAGNOSIS — M25641 Stiffness of right hand, not elsewhere classified: Secondary | ICD-10-CM | POA: Diagnosis not present

## 2022-08-18 DIAGNOSIS — R52 Pain, unspecified: Secondary | ICD-10-CM | POA: Diagnosis not present

## 2022-08-25 DIAGNOSIS — M654 Radial styloid tenosynovitis [de Quervain]: Secondary | ICD-10-CM | POA: Diagnosis not present

## 2022-08-25 DIAGNOSIS — M25641 Stiffness of right hand, not elsewhere classified: Secondary | ICD-10-CM | POA: Diagnosis not present

## 2022-08-25 DIAGNOSIS — R52 Pain, unspecified: Secondary | ICD-10-CM | POA: Diagnosis not present

## 2022-09-01 DIAGNOSIS — F4329 Adjustment disorder with other symptoms: Secondary | ICD-10-CM | POA: Diagnosis not present

## 2022-09-11 DIAGNOSIS — F4329 Adjustment disorder with other symptoms: Secondary | ICD-10-CM | POA: Diagnosis not present

## 2022-09-15 DIAGNOSIS — M654 Radial styloid tenosynovitis [de Quervain]: Secondary | ICD-10-CM | POA: Diagnosis not present

## 2022-09-15 DIAGNOSIS — R52 Pain, unspecified: Secondary | ICD-10-CM | POA: Diagnosis not present

## 2022-09-15 DIAGNOSIS — M25641 Stiffness of right hand, not elsewhere classified: Secondary | ICD-10-CM | POA: Diagnosis not present

## 2022-09-16 DIAGNOSIS — M654 Radial styloid tenosynovitis [de Quervain]: Secondary | ICD-10-CM | POA: Diagnosis not present

## 2022-09-16 DIAGNOSIS — L661 Lichen planopilaris: Secondary | ICD-10-CM | POA: Diagnosis not present

## 2022-09-16 DIAGNOSIS — Z79899 Other long term (current) drug therapy: Secondary | ICD-10-CM | POA: Diagnosis not present

## 2022-09-16 DIAGNOSIS — L409 Psoriasis, unspecified: Secondary | ICD-10-CM | POA: Diagnosis not present

## 2022-09-16 DIAGNOSIS — M791 Myalgia, unspecified site: Secondary | ICD-10-CM | POA: Diagnosis not present

## 2022-09-16 DIAGNOSIS — M1990 Unspecified osteoarthritis, unspecified site: Secondary | ICD-10-CM | POA: Diagnosis not present

## 2022-09-16 DIAGNOSIS — I7300 Raynaud's syndrome without gangrene: Secondary | ICD-10-CM | POA: Diagnosis not present

## 2022-09-16 DIAGNOSIS — K219 Gastro-esophageal reflux disease without esophagitis: Secondary | ICD-10-CM | POA: Diagnosis not present

## 2022-09-16 DIAGNOSIS — M7701 Medial epicondylitis, right elbow: Secondary | ICD-10-CM | POA: Diagnosis not present

## 2022-09-16 DIAGNOSIS — L4050 Arthropathic psoriasis, unspecified: Secondary | ICD-10-CM | POA: Diagnosis not present

## 2022-09-17 DIAGNOSIS — M542 Cervicalgia: Secondary | ICD-10-CM | POA: Diagnosis not present

## 2022-09-17 DIAGNOSIS — M9901 Segmental and somatic dysfunction of cervical region: Secondary | ICD-10-CM | POA: Diagnosis not present

## 2022-09-17 DIAGNOSIS — M9903 Segmental and somatic dysfunction of lumbar region: Secondary | ICD-10-CM | POA: Diagnosis not present

## 2022-09-17 DIAGNOSIS — M9904 Segmental and somatic dysfunction of sacral region: Secondary | ICD-10-CM | POA: Diagnosis not present

## 2022-09-17 DIAGNOSIS — M9905 Segmental and somatic dysfunction of pelvic region: Secondary | ICD-10-CM | POA: Diagnosis not present

## 2022-09-17 DIAGNOSIS — M25551 Pain in right hip: Secondary | ICD-10-CM | POA: Diagnosis not present

## 2022-09-17 DIAGNOSIS — M9906 Segmental and somatic dysfunction of lower extremity: Secondary | ICD-10-CM | POA: Diagnosis not present

## 2022-09-17 DIAGNOSIS — M79604 Pain in right leg: Secondary | ICD-10-CM | POA: Diagnosis not present

## 2022-09-17 DIAGNOSIS — M545 Low back pain, unspecified: Secondary | ICD-10-CM | POA: Diagnosis not present

## 2022-09-22 DIAGNOSIS — M62838 Other muscle spasm: Secondary | ICD-10-CM | POA: Diagnosis not present

## 2022-09-22 DIAGNOSIS — M6289 Other specified disorders of muscle: Secondary | ICD-10-CM | POA: Diagnosis not present

## 2022-09-22 DIAGNOSIS — N3941 Urge incontinence: Secondary | ICD-10-CM | POA: Diagnosis not present

## 2022-09-22 DIAGNOSIS — M6281 Muscle weakness (generalized): Secondary | ICD-10-CM | POA: Diagnosis not present

## 2022-09-23 ENCOUNTER — Other Ambulatory Visit (HOSPITAL_BASED_OUTPATIENT_CLINIC_OR_DEPARTMENT_OTHER): Payer: Self-pay

## 2022-09-23 DIAGNOSIS — M5451 Vertebrogenic low back pain: Secondary | ICD-10-CM | POA: Diagnosis not present

## 2022-09-23 DIAGNOSIS — Z23 Encounter for immunization: Secondary | ICD-10-CM | POA: Diagnosis not present

## 2022-09-23 DIAGNOSIS — S39012S Strain of muscle, fascia and tendon of lower back, sequela: Secondary | ICD-10-CM | POA: Diagnosis not present

## 2022-09-23 DIAGNOSIS — F4329 Adjustment disorder with other symptoms: Secondary | ICD-10-CM | POA: Diagnosis not present

## 2022-09-23 MED ORDER — COMIRNATY 30 MCG/0.3ML IM SUSY
PREFILLED_SYRINGE | INTRAMUSCULAR | 0 refills | Status: DC
  Filled 2022-09-23: qty 0.3, 1d supply, fill #0

## 2022-09-24 ENCOUNTER — Ambulatory Visit
Admission: RE | Admit: 2022-09-24 | Discharge: 2022-09-24 | Disposition: A | Discharge status: Home / Self Care | Payer: Medicare Other | Source: Ambulatory Visit | Attending: Obstetrics and Gynecology | Admitting: Obstetrics and Gynecology

## 2022-09-24 DIAGNOSIS — Z13820 Encounter for screening for osteoporosis: Secondary | ICD-10-CM

## 2022-09-28 DIAGNOSIS — G20A1 Parkinson's disease without dyskinesia, without mention of fluctuations: Secondary | ICD-10-CM | POA: Diagnosis not present

## 2022-09-28 DIAGNOSIS — F419 Anxiety disorder, unspecified: Secondary | ICD-10-CM | POA: Diagnosis not present

## 2022-09-30 ENCOUNTER — Ambulatory Visit (HOSPITAL_BASED_OUTPATIENT_CLINIC_OR_DEPARTMENT_OTHER): Payer: Medicare Other | Attending: Sports Medicine | Admitting: Physical Therapy

## 2022-09-30 ENCOUNTER — Other Ambulatory Visit: Payer: Self-pay

## 2022-09-30 DIAGNOSIS — M25641 Stiffness of right hand, not elsewhere classified: Secondary | ICD-10-CM | POA: Diagnosis not present

## 2022-09-30 DIAGNOSIS — M5451 Vertebrogenic low back pain: Secondary | ICD-10-CM | POA: Diagnosis not present

## 2022-09-30 DIAGNOSIS — R52 Pain, unspecified: Secondary | ICD-10-CM | POA: Diagnosis not present

## 2022-09-30 DIAGNOSIS — S39012S Strain of muscle, fascia and tendon of lower back, sequela: Secondary | ICD-10-CM | POA: Diagnosis not present

## 2022-09-30 DIAGNOSIS — M5459 Other low back pain: Secondary | ICD-10-CM | POA: Diagnosis not present

## 2022-09-30 DIAGNOSIS — M654 Radial styloid tenosynovitis [de Quervain]: Secondary | ICD-10-CM | POA: Diagnosis not present

## 2022-09-30 NOTE — Therapy (Signed)
OUTPATIENT PHYSICAL THERAPY THORACOLUMBAR EVALUATION   Patient Name: Felicia Cain MRN: [id number] DOB:1955/12/28, 67 y.o., female Today's Date: 10/01/2022  END OF SESSION:  PT End of Session - 10/01/22 0813     Visit Number 1    Number of Visits 4    Date for PT Re-Evaluation 10/28/22    Authorization Type mcr    PT Start Time 1122    PT Stop Time 1152    PT Time Calculation (min) 30 min    Activity Tolerance Patient tolerated treatment well    Behavior During Therapy WFL for tasks assessed/performed             Past Medical History:  Diagnosis Date   Anxiety    Arthritis    "back" (01/25/2017)   Brachial neuritis    neuropathy   Chronic lower back pain    DCIS (ductal carcinoma in situ)    "left side?"   Esophageal stricture    stricture with dysphasia   Fibromyalgia    GERD (gastroesophageal reflux disease)    H/O Doppler ultrasound 2006   see scanned study   H/O echocardiogram 2004, 2013   Hepatitis A 1961   [place]"   History of cardiac monitoring 2006   History of nuclear stress test 2004   see scanned study   Migraine    "in the past; cycle related" (01/25/2017)   Myasthenia gravis in crisis ([place]) 04/2014   respiratory crisis   NAION (non-arteritic anterior ischemic optic neuropathy), right    Near syncope    Neuromuscular disorder (HCC)    Neuropathy    Optic neuritis    ischaemic optic neuritis, non arteric   Optic neuropathy    PONV (postoperative nausea and vomiting)    Posterior optic neuritis    Ptosis of eyelid    Sleep apnea    "I wear Moses device; I don't wear CPAP" (01/25/2017)   Spinal stenosis    Past Surgical History:  Procedure Laterality Date   AUGMENTATION MAMMAPLASTY     2000, after breast cancer surgery,redone 2010   BACK SURGERY     BREAST BIOPSY     BREAST CAPSULECTOMY WITH IMPLANT EXCHANGE Bilateral 09/28/2018   Procedure: REMOVAL OF BILATERAL BREAST IMPLANTS WITH CAPSULECTOMIES AND REPLACEMENTS OF  IMPLANTS;  Surgeon: Wallace Going, DO;  Location: [facility];  Service: Plastics;  Laterality: Bilateral;   CARPAL TUNNEL RELEASE Bilateral    CESAREAN SECTION  1989   ESOPHAGOGASTRODUODENOSCOPY (EGD) WITH ESOPHAGEAL DILATION     HIP PINNING,CANNULATED Right 01/26/2017   Procedure: CANNULATED SCREWS/RIGHT HIP PINNING;  Surgeon: Mcarthur Rossetti, MD;  Location: [facility];  Service: Orthopedics;  Laterality: Right;   INNER EAR SURGERY Right 1995   LUMBAR LAMINECTOMY Right 1999   [name]   MASTECTOMY Bilateral    Patient Active Problem List   Diagnosis Date Noted   Pain in left elbow 10/21/2021   Pain in right elbow 10/21/2021   Tremor of right hand 07/23/2021   Titubation 07/23/2021   Weakness of both lower extremities 01/20/2021   Gait instability 01/20/2021   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 ([place]) 09/20/2019   Peroneal neuropathy at knee, left 03/19/2019   Patellar contusion, left, sequela 02/09/2019   Rib pain on right side 11/24/2018   Acquired absence of both breasts 09/20/2018   Ruptured right breast implant 08/26/2018   History of reconstruction of both breasts 07/08/2018   Breast implant capsular  contracture 07/08/2018   Breast pain 07/08/2018   Retrognathia 09/15/2017   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   Status post-operative repair of closed fracture of right hip 09/08/2017   Acute pain of right knee 05/24/2017   Hip fracture ([place]) 01/25/2017   Closed hip fracture, right, initial encounter ([place])    Preop examination    Fibromyalgia    Neuropathy    Corneal abrasion    Nausea    Fall    Seronegative myasthenia gravis ([place]) 07/19/2015   Acute exacerbation of chronic low back pain 07/19/2015   Leukocytosis 07/19/2015   Myasthenia gravis in remission ([place]) 10/23/2014   OSA on CPAP 10/23/2014   Hypersomnia, persistent 10/23/2014   Hypoxemia 08/14/2014   Myasthenia gravis with exacerbation  ([place]) 08/14/2014   Myasthenia gravis with exacerbation, ocular ([place]) 12/08/2012   Right shoulder pain 08/02/2012   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   History of optic neuritis 06/27/2012   Rosacea 06/27/2012   S/P laminectomy 06/27/2012   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   S/P bilateral mastectomy 06/26/2012   BRCA negative 06/26/2012   GERD (gastroesophageal reflux disease) 06/26/2012   Menopause 06/26/2012   Ankle pain 06/02/2012    PCP: Christiane Ha Stoneking  REFERRING PROVIDER:   Gerda Diss, DO    REFERRING DIAG:  M54.50 (ICD-10-CM) - Low back pain, unspecified  R26.81 (ICD-10-CM) - Unsteadiness on feet    Rationale for Evaluation and Treatment: Rehabilitation  THERAPY DIAG:  Other low back pain - Plan: PT plan of care cert/re-cert  ONSET DATE: >5 yrs  SUBJECTIVE:                                                                                                                                                                                           SUBJECTIVE STATEMENT: I do yoga 3 x week; 1 x week Tia chia, pilates, walking 30 mins on treadmill each day and foundation training.  Just finished OPPT for Parkinsons diagnosis.  Felt really good last Time in aquatic had to stop for a hand injury.  Amb back for back pain. I do 2 hours of exercise a day. Parkinsons rigidiy in thoracic spine, across LB. Have a hard time climbing stair.  PERTINENT HISTORY:  Parkinson's dx 01/2022 (not on medication); PMH includes cancer, Myasthenia gravis, lumbar lami/discectomy.  Dequervain's R hand, psoriatic arthritis, R hip surgery due to hip fracture   PAIN:  Are you having pain? Yes: NPRS scale: 4/10 Pain location: R buttock and hip area Aggravating factors: stretching Relieving factors: aquatics, ice/heat  PRECAUTIONS: Fall  WEIGHT BEARING RESTRICTIONS: No  FALLS:  Has  patient fallen in last 6 months? No  LIVING ENVIRONMENT: Lives with: lives with their family Lives  in: House/apartment Stairs: Yes: External: 3 steps; on left going up Has following equipment at home: Single point cane  OCCUPATION: Retired  PLOF: part-time job; does yoga and other regular exercise   PATIENT GOALS: decrease pain  NEXT MD VISIT: not scheduled  OBJECTIVE:   DIAGNOSTIC FINDINGS:  Nothing recent   COGNITION: Overall cognitive status: Within functional limits for tasks assessed     SENSATION: WFL  MUSCLE LENGTH: Hamstrings: Right 90 deg; Left 90 deg  POSTURE: No Significant postural limitations    LUMBAR ROM:   WFL with min pain at end range  LOWER EXTREMITY ROM:     WFL  LOWER EXTREMITY MMT:    MMT Right eval Left eval  Hip flexion 4+ 5  Hip extension    Hip abduction    Hip adduction    Hip internal rotation    Hip external rotation    Knee flexion 4+ 5  Knee extension 4+ 5  Ankle dorsiflexion 5 5  Ankle plantarflexion    Ankle inversion    Ankle eversion     (Blank rows = not tested)  LUMBAR SPECIAL TESTS:  Straight leg raise test: Negative  FUNCTIONAL TESTS:  5 times sit to stand: 13   Tug 12s GAIT: Distance walked: 500 ft Assistive device utilized: None Level of assistance: Complete Independence Comments: spasm/tremor in RLE with fatigue, step through pattern, decreased arm swing- Right, decreased arm swing- Left, decreased step length- Right, and poor foot clearance- Righ   TODAY'S TREATMENT:                                                                                                                              evaluation   PATIENT EDUCATION:  Education details: Discussed eval findings, rehab rationale and POC and patient is in agreement Person educated: Patient Education method: Explanation Education comprehension: verbalized understanding  HOME EXERCISE PROGRAM: [id number] Recent HEP assigned at [facility]  Aquatic HEP to be established instructed and laminated  ASSESSMENT:  CLINICAL IMPRESSION: Patient is a 67  y.o. f who was seen today for physical therapy evaluation and treatment for LBP and gait instability.  She has recently [facility].  Presents in [place] with goals of decreased back pain.  She has a significant PmHx as listed above and she works daily completing many forms of exercise to manage all chronic conditions. She had been seen here at [facility] prior to being seen at [facility], discontinuing due to having hand surgery. She has completed and successfully met all goals at [facility].  She  will benefit from a short episode in aquatics to instruct her on an aquatic HEP. She is instructed to gain access to a pool to be able to follow up with HEP. She is indep with land based program assigned by prior therapist as  well as mutliple other forms of exercises she completes for an average of 2 hours daily.  OBJECTIVE IMPAIRMENTS: Abnormal gait, decreased balance, decreased coordination, and pain.   ACTIVITY LIMITATIONS: standing and transfers  PARTICIPATION LIMITATIONS: community activity and yard work  PERSONAL FACTORS: Past/current experiences and Time since onset of injury/illness/exacerbation are also affecting patient's functional outcome.   REHAB POTENTIAL: Good  CLINICAL DECISION MAKING: Stable/uncomplicated  EVALUATION COMPLEXITY: Low   GOALS: Goals reviewed with patient? Yes  SHORT TERM GOALS: Target date: 10/28/22  Pt will be indep with aquatic HEP for long time management of chronic condition Baseline:no aquatic program Goal status: INITIAL  2.  Pt will gain access to pool in order to complete HEP Baseline: no access Goal status: INITIAL  3.  Pt will tolerate aquatic therapy sessions without increase in pain Baseline: TBA Goal status: INITIAL  4.  Pt will report a decrease in overall pain to <4/10  to demonstrate positive response to intervention Baseline: 4/10 Pt normal pain level Goal status: INITIAL    LONG TERM GOALS: Not appropriate at  this time  PLAN:  PT FREQUENCY: 1x/week  PT DURATION: 4 weeks  PLANNED INTERVENTIONS: Therapeutic exercises, Therapeutic activity, Neuromuscular re-education, Balance training, Gait training, Patient/Family education, Self Care, Joint mobilization, Stair training, Aquatic Therapy, Dry Needling, Cryotherapy, Moist heat, Taping, Traction, Ionotophoresis '4mg'$ /ml Dexamethasone, Manual therapy, and Re-evaluation.  PLAN FOR NEXT SESSION: Aquatic therapy for instruction on HEP for core strengthening   Denton Meek, PTMPT 10/01/2022, 8:15 AM

## 2022-10-01 ENCOUNTER — Encounter (HOSPITAL_BASED_OUTPATIENT_CLINIC_OR_DEPARTMENT_OTHER): Payer: Self-pay | Admitting: Physical Therapy

## 2022-10-07 ENCOUNTER — Other Ambulatory Visit: Payer: Self-pay | Admitting: Neurology

## 2022-10-07 DIAGNOSIS — S39012S Strain of muscle, fascia and tendon of lower back, sequela: Secondary | ICD-10-CM | POA: Diagnosis not present

## 2022-10-07 DIAGNOSIS — M5451 Vertebrogenic low back pain: Secondary | ICD-10-CM | POA: Diagnosis not present

## 2022-10-09 ENCOUNTER — Ambulatory Visit (HOSPITAL_BASED_OUTPATIENT_CLINIC_OR_DEPARTMENT_OTHER): Payer: Medicare Other | Attending: Sports Medicine | Admitting: Physical Therapy

## 2022-10-09 ENCOUNTER — Encounter (HOSPITAL_BASED_OUTPATIENT_CLINIC_OR_DEPARTMENT_OTHER): Payer: Self-pay | Admitting: Physical Therapy

## 2022-10-09 DIAGNOSIS — R2689 Other abnormalities of gait and mobility: Secondary | ICD-10-CM | POA: Insufficient documentation

## 2022-10-09 DIAGNOSIS — R2681 Unsteadiness on feet: Secondary | ICD-10-CM | POA: Diagnosis not present

## 2022-10-09 DIAGNOSIS — M5459 Other low back pain: Secondary | ICD-10-CM | POA: Diagnosis not present

## 2022-10-09 DIAGNOSIS — M6281 Muscle weakness (generalized): Secondary | ICD-10-CM | POA: Diagnosis not present

## 2022-10-09 NOTE — Therapy (Signed)
OUTPATIENT PHYSICAL THERAPY THORACOLUMBAR EVALUATION   Patient Name: Felicia Cain MRN: [id number] DOB:12/25/1955, 67 y.o., female Today's Date: 10/09/2022  END OF SESSION:  PT End of Session - 10/09/22 1140     Visit Number 2    Number of Visits 4    Date for PT Re-Evaluation 10/28/22    Authorization Type mcr    PT Start Time 0950    PT Stop Time 1030    PT Time Calculation (min) 40 min    Activity Tolerance Patient tolerated treatment well    Behavior During Therapy WFL for tasks assessed/performed             Past Medical History:  Diagnosis Date   Anxiety    Arthritis    "back" (01/25/2017)   Brachial neuritis    neuropathy   Chronic lower back pain    DCIS (ductal carcinoma in situ)    "left side?"   Esophageal stricture    stricture with dysphasia   Fibromyalgia    GERD (gastroesophageal reflux disease)    H/O Doppler ultrasound 2006   see scanned study   H/O echocardiogram 2004, 2013   Hepatitis A 1961   [place]"   History of cardiac monitoring 2006   History of nuclear stress test 2004   see scanned study   Migraine    "in the past; cycle related" (01/25/2017)   Myasthenia gravis in crisis ([place]) 04/2014   respiratory crisis   NAION (non-arteritic anterior ischemic optic neuropathy), right    Near syncope    Neuromuscular disorder (HCC)    Neuropathy    Optic neuritis    ischaemic optic neuritis, non arteric   Optic neuropathy    PONV (postoperative nausea and vomiting)    Posterior optic neuritis    Ptosis of eyelid    Sleep apnea    "I wear Moses device; I don't wear CPAP" (01/25/2017)   Spinal stenosis    Past Surgical History:  Procedure Laterality Date   AUGMENTATION MAMMAPLASTY     2000, after breast cancer surgery,redone 2010   BACK SURGERY     BREAST BIOPSY     BREAST CAPSULECTOMY WITH IMPLANT EXCHANGE Bilateral 09/28/2018   Procedure: REMOVAL OF BILATERAL BREAST IMPLANTS WITH CAPSULECTOMIES AND REPLACEMENTS OF  IMPLANTS;  Surgeon: Wallace Going, DO;  Location: [place];  Service: Plastics;  Laterality: Bilateral;   CARPAL TUNNEL RELEASE Bilateral    CESAREAN SECTION  1989   ESOPHAGOGASTRODUODENOSCOPY (EGD) WITH ESOPHAGEAL DILATION     HIP PINNING,CANNULATED Right 01/26/2017   Procedure: CANNULATED SCREWS/RIGHT HIP PINNING;  Surgeon: Mcarthur Rossetti, MD;  Location: [facility];  Service: Orthopedics;  Laterality: Right;   INNER EAR SURGERY Right 1995   LUMBAR LAMINECTOMY Right 1999   [name]   MASTECTOMY Bilateral    Patient Active Problem List   Diagnosis Date Noted   Pain in left elbow 10/21/2021   Pain in right elbow 10/21/2021   Tremor of right hand 07/23/2021   Titubation 07/23/2021   Weakness of both lower extremities 01/20/2021   Gait instability 01/20/2021   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 ([place]) 09/20/2019   Peroneal neuropathy at knee, left 03/19/2019   Patellar contusion, left, sequela 02/09/2019   Rib pain on right side 11/24/2018   Acquired absence of both breasts 09/20/2018   Ruptured right breast implant 08/26/2018   History of reconstruction of both breasts 07/08/2018   Breast implant capsular  contracture 07/08/2018   Breast pain 07/08/2018   Retrognathia 09/15/2017   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   Status post-operative repair of closed fracture of right hip 09/08/2017   Acute pain of right knee 05/24/2017   Hip fracture ([place]) 01/25/2017   Closed hip fracture, right, initial encounter ([place])    Preop examination    Fibromyalgia    Neuropathy    Corneal abrasion    Nausea    Fall    Seronegative myasthenia gravis ([place]) 07/19/2015   Acute exacerbation of chronic low back pain 07/19/2015   Leukocytosis 07/19/2015   Myasthenia gravis in remission ([place]) 10/23/2014   OSA on CPAP 10/23/2014   Hypersomnia, persistent 10/23/2014   Hypoxemia 08/14/2014   Myasthenia gravis with exacerbation  ([place]) 08/14/2014   Myasthenia gravis with exacerbation, ocular ([place]) 12/08/2012   Right shoulder pain 08/02/2012   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   History of optic neuritis 06/27/2012   Rosacea 06/27/2012   S/P laminectomy 06/27/2012   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   S/P bilateral mastectomy 06/26/2012   BRCA negative 06/26/2012   GERD (gastroesophageal reflux disease) 06/26/2012   Menopause 06/26/2012   Ankle pain 06/02/2012    PCP: Christiane Ha Stoneking  REFERRING PROVIDER:   Gerda Diss, DO    REFERRING DIAG:  M54.50 (ICD-10-CM) - Low back pain, unspecified  R26.81 (ICD-10-CM) - Unsteadiness on feet    Rationale for Evaluation and Treatment: Rehabilitation  THERAPY DIAG:  Other low back pain  Unsteadiness on feet  Other abnormalities of gait and mobility  ONSET DATE: >5 yrs  SUBJECTIVE:                                                                                                                                                                                           SUBJECTIVE STATEMENT: Feeling ok.  Have to get used to the water again.  I did get a membership to [employer]   I do yoga 3 x week; 1 x week Tia chia, pilates, walking 30 mins on treadmill each day and foundation training.  Just finished OPPT for Parkinsons diagnosis.  Felt really good last Time in aquatic had to stop for a hand injury.  Amb back for back pain. I do 2 hours of exercise a day. Parkinsons rigidiy in thoracic spine, across LB. Have a hard time climbing stair.  PERTINENT HISTORY:  Parkinson's dx 01/2022 (not on medication); PMH includes cancer, Myasthenia gravis, lumbar lami/discectomy.  Dequervain's R hand, psoriatic arthritis, R hip surgery due to hip fracture   PAIN:  Are you having pain? Yes: NPRS scale: 4/10 Pain  location: R buttock and hip area Aggravating factors: stretching Relieving factors: aquatics, ice/heat  PRECAUTIONS: Fall  WEIGHT BEARING RESTRICTIONS:  No  FALLS:  Has patient fallen in last 6 months? No  LIVING ENVIRONMENT: Lives with: lives with their family Lives in: House/apartment Stairs: Yes: External: 3 steps; on left going up Has following equipment at home: Single point cane  OCCUPATION: Retired  PLOF: part-time job; does yoga and other regular exercise   PATIENT GOALS: decrease pain  NEXT MD VISIT: not scheduled  OBJECTIVE:   DIAGNOSTIC FINDINGS:  Nothing recent   COGNITION: Overall cognitive status: Within functional limits for tasks assessed     SENSATION: WFL  MUSCLE LENGTH: Hamstrings: Right 90 deg; Left 90 deg  POSTURE: No Significant postural limitations    LUMBAR ROM:   WFL with min pain at end range  LOWER EXTREMITY ROM:     WFL  LOWER EXTREMITY MMT:    MMT Right eval Left eval  Hip flexion 4+ 5  Hip extension    Hip abduction    Hip adduction    Hip internal rotation    Hip external rotation    Knee flexion 4+ 5  Knee extension 4+ 5  Ankle dorsiflexion 5 5  Ankle plantarflexion    Ankle inversion    Ankle eversion     (Blank rows = not tested)  LUMBAR SPECIAL TESTS:  Straight leg raise test: Negative  FUNCTIONAL TESTS:  5 times sit to stand: 13   Tug 12s GAIT: Distance walked: 500 ft Assistive device utilized: None Level of assistance: Complete Independence Comments: spasm/tremor in RLE with fatigue, step through pattern, decreased arm swing- Right, decreased arm swing- Left, decreased step length- Right, and poor foot clearance- Righ   TODAY'S TREATMENT:                                                                                                                               Pt seen for aquatic therapy today.  Treatment took place in water 3.25-4.5 ft in depth at the [facility]. Temp of water was 92.  Pt entered/exited the pool via stairs independently with bilat rail.   *UE support white barbell: forward, back and side stepping *Standing: UE  support in gutter then on barbell pushed up against wall:df; pf; add/abd; hip extension; 1/2  diamonds; curtsy squats; mini squats x 10-12  - L stretch *UE support on barbell pushed up against wall: SLS R/L *1 foam buoy carry: forward, back and side stepping. Instruction on abdominal bracing for added core engagement and to improve balance (Pt challenges with balance)   PATIENT EDUCATION:  Education details: Discussed eval findings, rehab rationale and POC and patient is in agreement Person educated: Patient Education method: Explanation Education comprehension: verbalized understanding  HOME EXERCISE PROGRAM: [id number] Recent HEP assigned at [facility] to be established instructed and laminated  Aquatics Added 10/09/22 Denton Meek MPT  Access Code: [email or web address]  URL: [email or web address] Date: 10/09/2022   Exercises - Standing 'L' Stretch at Counter  - 1 x daily - 7 x weekly - 3 sets - 10 reps - Kettlebell Suitcase Carry  - 1 x daily - 7 x weekly - 3 sets - 10 reps - Side Stepping  - 1 x daily - 7 x weekly - 3 sets - 10 reps - Standing Hip Flexion Extension at [employer]  - 1 x daily - 7 x weekly - 3 sets - 10 reps - Standing March at [facility]  - 1 x daily - 7 x weekly - 3 sets - 10 reps - Heel Toe Raises at [place]  - 1 x daily - 7 x weekly - 3 sets - 10 reps - Single Leg Stance at Pool Wall  - 1 x daily - 7 x weekly - 3 sets - 10 reps - Braided Sidestepping with Arms Out  - 1 x daily - 7 x weekly - 3 sets - 10 reps - Sit to Stand  - 1 x daily - 7 x weekly - 3 sets - 10 reps - Standing Hip Internal and External Rotation  - 1 x daily - 7 x weekly - 3 sets - 10 reps - Lateral Single Leg Lunge Jumps  - 1 x daily - 7 x weekly - 3 sets - 10 reps  ASSESSMENT:  CLINICAL IMPRESSION: Directed pt through laminated exercises program completing with instruction ~ 3/4 of program.  She requires VC and demonstration for proper execution. Instructed along way on ways to  increase and decrease difficulty. RUE discomfort holding to wall, tolerated ue support on white hand buoy pushed up against wall.  She has now a membership to [employer] and has begun going to aerobics classes.  She is issued her aquatic HEP with instructions to bring it back next session to continue with the instruction.  She VU.   Patient is a 67 y.o. f who was seen today for physical therapy evaluation and treatment for LBP and gait instability.  She has recently [facility].  Presents in [place] with goals of decreased back pain.  She has a significant PmHx as listed above and she works daily completing many forms of exercise to manage all chronic conditions. She had been seen here at [facility] prior to being seen at [facility], discontinuing due to having hand surgery. She has completed and successfully met all goals at [facility].  She  will benefit from a short episode in aquatics to instruct her on an aquatic HEP. She is instructed to gain access to a pool to be able to follow up with HEP. She is indep with land based program assigned by prior therapist as well as mutliple other forms of exercises she completes for an average of 2 hours daily.  OBJECTIVE IMPAIRMENTS: Abnormal gait, decreased balance, decreased coordination, and pain.   ACTIVITY LIMITATIONS: standing and transfers  PARTICIPATION LIMITATIONS: community activity and yard work  PERSONAL FACTORS: Past/current experiences and Time since onset of injury/illness/exacerbation are also affecting patient's functional outcome.   REHAB POTENTIAL: Good  CLINICAL DECISION MAKING: Stable/uncomplicated  EVALUATION COMPLEXITY: Low   GOALS: Goals reviewed with patient? Yes  SHORT TERM GOALS: Target date: 10/28/22  Pt will be indep with aquatic HEP for long time management of chronic condition Baseline:no aquatic program Goal status: INITIAL  2.  Pt will gain access to pool in order to complete HEP Baseline: no  access  Goal status: Met 10/09/22  3.  Pt will tolerate aquatic therapy sessions without increase in pain Baseline: TBA Goal status: INITIAL  4.  Pt will report a decrease in overall pain to <4/10  to demonstrate positive response to intervention Baseline: 4/10 Pt normal pain level Goal status: INITIAL    LONG TERM GOALS: Not appropriate at this time  PLAN:  PT FREQUENCY: 1x/week  PT DURATION: 4 weeks  PLANNED INTERVENTIONS: Therapeutic exercises, Therapeutic activity, Neuromuscular re-education, Balance training, Gait training, Patient/Family education, Self Care, Joint mobilization, Stair training, Aquatic Therapy, Dry Needling, Cryotherapy, Moist heat, Taping, Traction, Ionotophoresis '4mg'$ /ml Dexamethasone, Manual therapy, and Re-evaluation.  PLAN FOR NEXT SESSION: Aquatic therapy for instruction on HEP for core strengthening   Denton Meek, PTMPT 10/09/2022, 11:41 AM

## 2022-10-12 DIAGNOSIS — N3941 Urge incontinence: Secondary | ICD-10-CM | POA: Diagnosis not present

## 2022-10-12 DIAGNOSIS — M62838 Other muscle spasm: Secondary | ICD-10-CM | POA: Diagnosis not present

## 2022-10-12 DIAGNOSIS — M6289 Other specified disorders of muscle: Secondary | ICD-10-CM | POA: Diagnosis not present

## 2022-10-12 DIAGNOSIS — M6281 Muscle weakness (generalized): Secondary | ICD-10-CM | POA: Diagnosis not present

## 2022-10-14 ENCOUNTER — Encounter (HOSPITAL_BASED_OUTPATIENT_CLINIC_OR_DEPARTMENT_OTHER): Payer: Self-pay | Admitting: Physical Therapy

## 2022-10-14 ENCOUNTER — Ambulatory Visit (HOSPITAL_BASED_OUTPATIENT_CLINIC_OR_DEPARTMENT_OTHER): Payer: Medicare Other | Admitting: Physical Therapy

## 2022-10-14 DIAGNOSIS — M6281 Muscle weakness (generalized): Secondary | ICD-10-CM

## 2022-10-14 DIAGNOSIS — R2689 Other abnormalities of gait and mobility: Secondary | ICD-10-CM

## 2022-10-14 DIAGNOSIS — M5459 Other low back pain: Secondary | ICD-10-CM

## 2022-10-14 DIAGNOSIS — R2681 Unsteadiness on feet: Secondary | ICD-10-CM | POA: Diagnosis not present

## 2022-10-14 NOTE — Therapy (Signed)
OUTPATIENT PHYSICAL THERAPY THORACOLUMBAR EVALUATION   Patient Name: Felicia Cain MRN: [id number] DOB:[birth year], 67 y.o., female Today's Date: 10/14/2022  END OF SESSION:  PT End of Session - 10/14/22 1308     Visit Number 3    Number of Visits 4    Date for PT Re-Evaluation 10/28/22    Authorization Type MCR    PT Start Time 4098    PT Stop Time 1330    PT Time Calculation (min) 42 min             Past Medical History:  Diagnosis Date   Anxiety    Arthritis    "back" (01/25/2017)   Brachial neuritis    neuropathy   Chronic lower back pain    DCIS (ductal carcinoma in situ)    "left side?"   Esophageal stricture    stricture with dysphasia   Fibromyalgia    GERD (gastroesophageal reflux disease)    H/O Doppler ultrasound 2006   see scanned study   H/O echocardiogram 2004, 2013   Hepatitis A 1961   [place]"   History of cardiac monitoring 2006   History of nuclear stress test 2004   see scanned study   Migraine    "in the past; cycle related" (01/25/2017)   Myasthenia gravis in crisis ([place]) 04/2014   respiratory crisis   NAION (non-arteritic anterior ischemic optic neuropathy), right    Near syncope    Neuromuscular disorder (HCC)    Neuropathy    Optic neuritis    ischaemic optic neuritis, non arteric   Optic neuropathy    PONV (postoperative nausea and vomiting)    Posterior optic neuritis    Ptosis of eyelid    Sleep apnea    "I wear Moses device; I don't wear CPAP" (01/25/2017)   Spinal stenosis    Past Surgical History:  Procedure Laterality Date   AUGMENTATION MAMMAPLASTY     2000, after breast cancer surgery,redone 2010   BACK SURGERY     BREAST BIOPSY     BREAST CAPSULECTOMY WITH IMPLANT EXCHANGE Bilateral 09/28/2018   Procedure: REMOVAL OF BILATERAL BREAST IMPLANTS WITH CAPSULECTOMIES AND REPLACEMENTS OF IMPLANTS;  Surgeon: Wallace Going, DO;  Location: [facility];  Service: Plastics;  Laterality:  Bilateral;   CARPAL TUNNEL RELEASE Bilateral    CESAREAN SECTION  1989   ESOPHAGOGASTRODUODENOSCOPY (EGD) WITH ESOPHAGEAL DILATION     HIP PINNING,CANNULATED Right 01/26/2017   Procedure: CANNULATED SCREWS/RIGHT HIP PINNING;  Surgeon: Mcarthur Rossetti, MD;  Location: [facility];  Service: Orthopedics;  Laterality: Right;   INNER EAR SURGERY Right 1995   LUMBAR LAMINECTOMY Right 1999   [name]   MASTECTOMY Bilateral    Patient Active Problem List   Diagnosis Date Noted   Pain in left elbow 10/21/2021   Pain in right elbow 10/21/2021   Tremor of right hand 07/23/2021   Titubation 07/23/2021   Weakness of both lower extremities 01/20/2021   Gait instability 01/20/2021   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 ([place]) 09/20/2019   Peroneal neuropathy at knee, left 03/19/2019   Patellar contusion, left, sequela 02/09/2019   Rib pain on right side 11/24/2018   Acquired absence of both breasts 09/20/2018   Ruptured right breast implant 08/26/2018   History of reconstruction of both breasts 07/08/2018   Breast implant capsular contracture 07/08/2018   Breast pain 07/08/2018   Retrognathia 09/15/2017   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  Status post-operative repair of closed fracture of right hip 09/08/2017   Acute pain of right knee 05/24/2017   Hip fracture ([place]) 01/25/2017   Closed hip fracture, right, initial encounter ([place])    Preop examination    Fibromyalgia    Neuropathy    Corneal abrasion    Nausea    Fall    Seronegative myasthenia gravis ([place]) 07/19/2015   Acute exacerbation of chronic low back pain 07/19/2015   Leukocytosis 07/19/2015   Myasthenia gravis in remission ([place]) 10/23/2014   OSA on CPAP 10/23/2014   Hypersomnia, persistent 10/23/2014   Hypoxemia 08/14/2014   Myasthenia gravis with exacerbation ([place]) 08/14/2014   Myasthenia gravis with exacerbation, ocular ([place]) 12/08/2012   Right shoulder pain 08/02/2012    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   History of optic neuritis 06/27/2012   Rosacea 06/27/2012   S/P laminectomy 06/27/2012   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   S/P bilateral mastectomy 06/26/2012   BRCA negative 06/26/2012   GERD (gastroesophageal reflux disease) 06/26/2012   Menopause 06/26/2012   Ankle pain 06/02/2012    PCP: Christiane Ha Stoneking  REFERRING PROVIDER:   Gerda Diss, DO    REFERRING DIAG:  M54.50 (ICD-10-CM) - Low back pain, unspecified  R26.81 (ICD-10-CM) - Unsteadiness on feet    Rationale for Evaluation and Treatment: Rehabilitation  THERAPY DIAG:  Other low back pain  Unsteadiness on feet  Other abnormalities of gait and mobility  Muscle weakness (generalized)  ONSET DATE: >5 yrs  SUBJECTIVE:                                                                                                                                                                                           SUBJECTIVE STATEMENT: Feeling ok today.  She has not visited pool yet; has had family in town visiting.   PERTINENT HISTORY:  Parkinson's dx 01/2022 (not on medication); PMH includes cancer, Myasthenia gravis, lumbar lami/discectomy.  Dequervain's R hand, psoriatic arthritis, R hip surgery due to hip fracture   PAIN:  Are you having pain? No : NPRS scale: not rated /10 Pain location:  Aggravating factors: stretching Relieving factors: aquatics, ice/heat  PRECAUTIONS: Fall  WEIGHT BEARING RESTRICTIONS: No  FALLS:  Has patient fallen in last 6 months? No  LIVING ENVIRONMENT: Lives with: lives with their family Lives in: House/apartment Stairs: Yes: External: 3 steps; on left going up Has following equipment at home: Single point cane  OCCUPATION: Retired  PLOF: part-time job; does yoga and other regular exercise   PATIENT GOALS: decrease pain  NEXT MD VISIT: not scheduled  OBJECTIVE:   DIAGNOSTIC FINDINGS:  Nothing  recent   COGNITION: Overall cognitive status: Within functional limits for tasks assessed     SENSATION: WFL  MUSCLE LENGTH: Hamstrings: Right 90 deg; Left 90 deg  POSTURE: No Significant postural limitations    LUMBAR ROM:   WFL with min pain at end range  LOWER EXTREMITY ROM:     WFL  LOWER EXTREMITY MMT:    MMT Right eval Left eval  Hip flexion 4+ 5  Hip extension    Hip abduction    Hip adduction    Hip internal rotation    Hip external rotation    Knee flexion 4+ 5  Knee extension 4+ 5  Ankle dorsiflexion 5 5  Ankle plantarflexion    Ankle inversion    Ankle eversion     (Blank rows = not tested)  LUMBAR SPECIAL TESTS:  Straight leg raise test: Negative  FUNCTIONAL TESTS:  5 times sit to stand: 13   Tug 12s GAIT: Distance walked: 500 ft Assistive device utilized: None Level of assistance: Complete Independence Comments: spasm/tremor in RLE with fatigue, step through pattern, decreased arm swing- Right, decreased arm swing- Left, decreased step length- Right, and poor foot clearance- Righ   TODAY'S TREATMENT:                                                                                                                               Pt seen for aquatic therapy today.  Treatment took place in water 3.5 ft in depth at the [facility]. Temp of water was 92.  Pt entered/exited the pool via stairs independently with bilat rail.   *UE support white barbell: forward, side stepping * L stretch at wall x 10s x 3 reps  * holding wall:  weight shifts forward/backward for toe ROM; Heel / toe raises; SLS with intermittent support x 3 reps;  single leg clam x 10  * holding rainbow hand floats at side walking forward, repeated with single at side (hurt Lt wrist) * braiding holding wall - > without UE support * marching with reciprocal arm swing * STS at bench x 10; seated at bench - ankle circles and inv/eversion of bilat ankles   * side stepping  with arm abdct/ add;  high knee marching forward/ backward   * holding wall intermittently:  hip abdct/ addct x 15;  hip ext x 15; hip flex (ext knee) x 15; curtsy lunges x 10  * return to walking (unsupported) forward/ backward with reciprocal arm swing  PATIENT EDUCATION:  Education details: HEP, aquatic progressions/ modifications  Person educated: Patient Education method: Explanation Education comprehension: verbalized understanding  HOME EXERCISE PROGRAM: [email or web address] Recent HEP assigned at OP  Aquatic HEP Access Code: [email or web address] URL: [email or web address] Date: 10/14/2022 Prepared by: [place] Exercises - Staggered Stance Forward Backward Weight Shift with Counter Support  - 1 x daily - 7 x weekly - 2 sets - 10 reps -  Standing 'L' Stretch at Counter  - 1 x daily - 7 x weekly - 2-3 reps - 10 seconds  hold - Side Stepping  - 1 x daily - 7 x weekly - 3 sets - Standing Hip Flexion Extension at [employer]  - 1 x daily - 7 x weekly - 2 sets - 10 reps - Heel Toe Raises at [place]  - 1 x daily - 7 x weekly - 2 sets - 10 reps - Single Leg Stance at Pool Wall  - 1 x daily - 7 x weekly - 1 sets - 3 reps - 15-30 seconds  hold - Standing Hip Abduction  - 1 x daily - 7 x weekly - 1-2 sets - 10 reps - Single Leg Stance Clamshell  - 1 x daily - 7 x weekly - 1-2 sets - 10 reps - Braided Sidestepping with Arms Out  - 1 x daily - 7 x weekly - 3 sets - Sit to Stand  - 1 x daily - 7 x weekly - 1-2 sets - 10 reps - Skater Lunges  - 1 x daily - 7 x weekly - 10 reps - Walking March  - 1 x daily - 7 x weekly - 3 sets  ASSESSMENT:  CLINICAL IMPRESSION: Directed pt through laminated exercises program completing with instruction in entire program.  She requires VC and demonstration for proper execution. Pt initially guarded with gait in water; with increased time she became more relaxed and was able to complete exercises without UE on wall or floatation  device. Plan to spend next visit instructing pt in [place] for variation on strengthening.  She was issued updated aquatic HEP.  Pt will have one last visit to finalize HEP then will continue independently, per PT instructions.  No increase in pain while in the water.   Progressing well towards goals   OBJECTIVE IMPAIRMENTS: Abnormal gait, decreased balance, decreased coordination, and pain.   ACTIVITY LIMITATIONS: standing and transfers  PARTICIPATION LIMITATIONS: community activity and yard work  PERSONAL FACTORS: Past/current experiences and Time since onset of injury/illness/exacerbation are also affecting patient's functional outcome.   REHAB POTENTIAL: Good  CLINICAL DECISION MAKING: Stable/uncomplicated  EVALUATION COMPLEXITY: Low   GOALS: Goals reviewed with patient? Yes  SHORT TERM GOALS: Target date: 10/28/22  Pt will be indep with aquatic HEP for long time management of chronic condition Baseline:no aquatic program Goal status: INITIAL  2.  Pt will gain access to pool in order to complete HEP Baseline: no access Goal status: Met 10/09/22  3.  Pt will tolerate aquatic therapy sessions without increase in pain Baseline: TBA Goal status: INITIAL  4.  Pt will report a decrease in overall pain to <4/10  to demonstrate positive response to intervention Baseline: 4/10 Pt normal pain level Goal status: INITIAL    LONG TERM GOALS: Not appropriate at this time  PLAN:  PT FREQUENCY: 1x/week  PT DURATION: 4 weeks  PLANNED INTERVENTIONS: Therapeutic exercises, Therapeutic activity, Neuromuscular re-education, Balance training, Gait training, Patient/Family education, Self Care, Joint mobilization, Stair training, Aquatic Therapy, Dry Needling, Cryotherapy, Moist heat, Taping, Traction, Ionotophoresis '4mg'$ /ml Dexamethasone, Manual therapy, and Re-evaluation.  PLAN FOR NEXT SESSION: advance HEP and d/c.   Kerin Perna, PTA 10/14/22 2:00 PM [facility] [place], [place], [address] Phone: [phone]   Fax:  [phone]

## 2022-10-15 ENCOUNTER — Other Ambulatory Visit (HOSPITAL_BASED_OUTPATIENT_CLINIC_OR_DEPARTMENT_OTHER): Payer: Self-pay

## 2022-10-15 DIAGNOSIS — M9903 Segmental and somatic dysfunction of lumbar region: Secondary | ICD-10-CM | POA: Diagnosis not present

## 2022-10-15 DIAGNOSIS — M79652 Pain in left thigh: Secondary | ICD-10-CM | POA: Diagnosis not present

## 2022-10-15 DIAGNOSIS — M4306 Spondylolysis, lumbar region: Secondary | ICD-10-CM | POA: Diagnosis not present

## 2022-10-15 DIAGNOSIS — M25552 Pain in left hip: Secondary | ICD-10-CM | POA: Diagnosis not present

## 2022-10-15 DIAGNOSIS — M9905 Segmental and somatic dysfunction of pelvic region: Secondary | ICD-10-CM | POA: Diagnosis not present

## 2022-10-15 DIAGNOSIS — M9906 Segmental and somatic dysfunction of lower extremity: Secondary | ICD-10-CM | POA: Diagnosis not present

## 2022-10-15 DIAGNOSIS — M9904 Segmental and somatic dysfunction of sacral region: Secondary | ICD-10-CM | POA: Diagnosis not present

## 2022-10-15 DIAGNOSIS — M79605 Pain in left leg: Secondary | ICD-10-CM | POA: Diagnosis not present

## 2022-10-15 MED ORDER — AREXVY 120 MCG/0.5ML IM SUSR
INTRAMUSCULAR | 0 refills | Status: DC
  Filled 2022-10-15: qty 0.5, 1d supply, fill #0

## 2022-10-22 ENCOUNTER — Ambulatory Visit (HOSPITAL_BASED_OUTPATIENT_CLINIC_OR_DEPARTMENT_OTHER): Payer: Medicare Other | Admitting: Physical Therapy

## 2022-10-22 DIAGNOSIS — R2689 Other abnormalities of gait and mobility: Secondary | ICD-10-CM | POA: Diagnosis not present

## 2022-10-22 DIAGNOSIS — R2681 Unsteadiness on feet: Secondary | ICD-10-CM

## 2022-10-22 DIAGNOSIS — M6281 Muscle weakness (generalized): Secondary | ICD-10-CM

## 2022-10-22 DIAGNOSIS — M5459 Other low back pain: Secondary | ICD-10-CM | POA: Diagnosis not present

## 2022-10-22 NOTE — Therapy (Addendum)
OUTPATIENT PHYSICAL THERAPY THORACOLUMBAR TREATMENT  PHYSICAL THERAPY DISCHARGE SUMMARY  Visits from Start of Care: 4  Current functional level related to goals / functional outcomes: indep   Remaining deficits: Chronic pain   Education / Equipment: Management of condition/HEP   Patient agrees to discharge. Patient goals were met. Patient is being discharged due to meeting the stated rehab goals.  Patient Name: Felicia Cain MRN: [id number] DOB:1955/09/10, 67 y.o., female Today's Date: 10/22/2022  END OF SESSION:  PT End of Session - 10/22/22 0827     Visit Number 4    Number of Visits 4    Date for PT Re-Evaluation 10/28/22    Authorization Type MCR    PT Start Time 0818    PT Stop Time 0856    PT Time Calculation (min) 38 min    Activity Tolerance Patient tolerated treatment well    Behavior During Therapy [facility] for tasks assessed/performed             Past Medical History:  Diagnosis Date   Anxiety    Arthritis    "back" (01/25/2017)   Brachial neuritis    neuropathy   Chronic lower back pain    DCIS (ductal carcinoma in situ)    "left side?"   Esophageal stricture    stricture with dysphasia   Fibromyalgia    GERD (gastroesophageal reflux disease)    H/O Doppler ultrasound 2006   see scanned study   H/O echocardiogram 2004, 2013   Hepatitis A 1961   [place]   History of cardiac monitoring 2006   History of nuclear stress test 2004   see scanned study   Migraine    "in the past; cycle related" (01/25/2017)   Myasthenia gravis in crisis (HCC) 04/2014   respiratory crisis   NAION (non-arteritic anterior ischemic optic neuropathy), right    Near syncope    Neuromuscular disorder (HCC)    Neuropathy    Optic neuritis    ischaemic optic neuritis, non arteric   Optic neuropathy    PONV (postoperative nausea and vomiting)    Posterior optic neuritis    Ptosis of eyelid    Sleep apnea    "I wear Moses device; I don't wear CPAP"  (01/25/2017)   Spinal stenosis    Past Surgical History:  Procedure Laterality Date   AUGMENTATION MAMMAPLASTY     2000, after breast cancer surgery,redone 2010   BACK SURGERY     BREAST BIOPSY     BREAST CAPSULECTOMY WITH IMPLANT EXCHANGE Bilateral 09/28/2018   Procedure: REMOVAL OF BILATERAL BREAST IMPLANTS WITH CAPSULECTOMIES AND REPLACEMENTS OF IMPLANTS;  Surgeon: Peggye Form, DO;  Location: [facility];  Service: Plastics;  Laterality: Bilateral;   CARPAL TUNNEL RELEASE Bilateral    CESAREAN SECTION  1989   ESOPHAGOGASTRODUODENOSCOPY (EGD) WITH ESOPHAGEAL DILATION     HIP PINNING,CANNULATED Right 01/26/2017   Procedure: CANNULATED SCREWS/RIGHT HIP PINNING;  Surgeon: Kathryne Hitch, MD;  Location: [facility];  Service: Orthopedics;  Laterality: Right;   INNER EAR SURGERY Right 1995   LUMBAR LAMINECTOMY Right 1999   Dr Venetia Maxon   MASTECTOMY Bilateral    Patient Active Problem List   Diagnosis Date Noted   Pain in left elbow 10/21/2021   Pain in right elbow 10/21/2021   Tremor of right hand 07/23/2021   Titubation 07/23/2021   Weakness of both lower extremities 01/20/2021   Gait instability 01/20/2021   Rheumatoid arthritis involving both wrists with  negative rheumatoid factor (HCC) 09/20/2019   Peroneal neuropathy at knee, left 03/19/2019   Patellar contusion, left, sequela 02/09/2019   Rib pain on right side 11/24/2018   Acquired absence of both breasts 09/20/2018   Ruptured right breast implant 08/26/2018   History of reconstruction of both breasts 07/08/2018   Breast implant capsular contracture 07/08/2018   Breast pain 07/08/2018   Retrognathia 09/15/2017   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   Status post-operative repair of closed fracture of right hip 09/08/2017   Acute pain of right knee 05/24/2017   Hip fracture (HCC) 01/25/2017   Closed hip fracture, right, initial encounter ([facility]CC)    Preop examination    Fibromyalgia    Neuropathy     Corneal abrasion    Nausea    Fall    Seronegative myasthenia gravis (HCC) 07/19/2015   Acute exacerbation of chronic low back pain 07/19/2015   Leukocytosis 07/19/2015   Myasthenia gravis in remission (HCC) 10/23/2014   OSA on CPAP 10/23/2014   Hypersomnia, persistent 10/23/2014   Hypoxemia 08/14/2014   Myasthenia gravis with exacerbation (HCC) 08/14/2014   Myasthenia gravis with exacerbation, ocular (HCC) 12/08/2012   Right shoulder pain 08/02/2012   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   History of optic neuritis 06/27/2012   Rosacea 06/27/2012   S/P laminectomy 06/27/2012   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   S/P bilateral mastectomy 06/26/2012   BRCA negative 06/26/2012   GERD (gastroesophageal reflux disease) 06/26/2012   Menopause 06/26/2012   Ankle pain 06/02/2012    PCP: Ann Maki Stoneking  REFERRING PROVIDER:   Andrena Mews, DO    REFERRING DIAG:  M54.50 (ICD-10-CM) - Low back pain, unspecified  R26.81 (ICD-10-CM) - Unsteadiness on feet    Rationale for Evaluation and Treatment: Rehabilitation  THERAPY DIAG:  Other low back pain  Unsteadiness on feet  Other abnormalities of gait and mobility  Muscle weakness (generalized)  ONSET DATE: >5 yrs  SUBJECTIVE:                                                                                                                                                                                           SUBJECTIVE STATEMENT: She has not visited pool yet; has had family in town visiting.  Plans to come to pool in the early mornings.   PERTINENT HISTORY:  Parkinson's dx 01/2022 (not on medication); PMH includes cancer, Myasthenia gravis, lumbar lami/discectomy.  Dequervain's R hand, psoriatic arthritis, R hip surgery due to hip fracture   PAIN:  Are you having pain? yes : NPRS scale: 3 /10 Pain location: lower back and Rt ankle  Aggravating factors: ? Relieving factors: aquatics, ice/heat  PRECAUTIONS:  Fall  WEIGHT BEARING RESTRICTIONS: No  FALLS:  Has patient fallen in last 6 months? No  LIVING ENVIRONMENT: Lives with: lives with their family Lives in: House/apartment Stairs: Yes: External: 3 steps; on left going up Has following equipment at home: Single point cane  OCCUPATION: Retired  PLOF: part-time job; does yoga and other regular exercise   PATIENT GOALS: decrease pain  NEXT MD VISIT: not scheduled  OBJECTIVE:   DIAGNOSTIC FINDINGS:  Nothing recent   COGNITION: Overall cognitive status: Within functional limits for tasks assessed     SENSATION: WFL  MUSCLE LENGTH: Hamstrings: Right 90 deg; Left 90 deg  POSTURE: No Significant postural limitations    LUMBAR ROM:   WFL with min pain at end range  LOWER EXTREMITY ROM:     WFL  LOWER EXTREMITY MMT:    MMT Right eval Left eval  Hip flexion 4+ 5  Hip extension    Hip abduction    Hip adduction    Hip internal rotation    Hip external rotation    Knee flexion 4+ 5  Knee extension 4+ 5  Ankle dorsiflexion 5 5  Ankle plantarflexion    Ankle inversion    Ankle eversion     (Blank rows = not tested)  LUMBAR SPECIAL TESTS:  Straight leg raise test: Negative  FUNCTIONAL TESTS:  5 times sit to stand: 13   Tug 12s GAIT: Distance walked: 500 ft Assistive device utilized: None Level of assistance: Complete Independence Comments: spasm/tremor in RLE with fatigue, step through pattern, decreased arm swing- Right, decreased arm swing- Left, decreased step length- Right, and poor foot clearance- Righ   TODAY'S TREATMENT:                                                                                                                               Pt seen for aquatic therapy today.  Treatment took place in water 3.5 ft in depth at the [facility] pool. Temp of water was 92.  Pt entered/exited the pool via stairs independently with bilat rail.   * L stretch at wall x 10s x 3 reps  * holding  wall:  Hip ext x 10; Hip abdct x 10; single leg clam x 5; braiding R/L; leg swings into flex * Holding noodle:  forward walk and marching; backward gait;  heel/toe raises x 10 * holding rainbow hand floats: side stepping with arm addct/abdct   * holding wall: curtsy lunges with cues for technique;  SLS with intermittent UE on wall to steady * seated at bench - ankle circles and inv/eversion of bilat ankles; STS at bench x 5; STS on 3rd step from top x 5  *  Ai Chi: contemplating, floating, uplifting, enclosing, folding, soothing, gathering, freeing, shifting (challenge!), accepting, accepting with grace (x1); flowing, reflecting    PATIENT EDUCATION:  Education details: HEP, aquatic progressions/ modifications  Person educated: Patient Education method: Explanation Education comprehension: verbalized understanding  HOME EXERCISE PROGRAM: [email or web address] Recent HEP assigned at OP  Aquatic HEP Access Code: [email or web address] URL: [email or web address] Date: 10/14/2022 Prepared by: [facility] - [address] Exercises - Staggered Stance Forward Backward Weight Shift with Counter Support  - 1 x daily - 7 x weekly - 2 sets - 10 reps - Standing 'L' Stretch at Counter  - 1 x daily - 7 x weekly - 2-3 reps - 10 seconds  hold - Side Stepping  - 1 x daily - 7 x weekly - 3 sets - Standing Hip Flexion Extension at [employer]  - 1 x daily - 7 x weekly - 2 sets - 10 reps - Heel Toe Raises at Pool Wall  - 1 x daily - 7 x weekly - 2 sets - 10 reps - Single Leg Stance at Pool Wall  - 1 x daily - 7 x weekly - 1 sets - 3 reps - 15-30 seconds  hold - Standing Hip Abduction  - 1 x daily - 7 x weekly - 1-2 sets - 10 reps - Single Leg Stance Clamshell  - 1 x daily - 7 x weekly - 1-2 sets - 10 reps - Braided Sidestepping with Arms Out  - 1 x daily - 7 x weekly - 3 sets - Sit to Stand  - 1 x daily - 7 x weekly - 1-2 sets - 10 reps - Skater Lunges  - 1 x daily - 7 x weekly - 10 reps - Walking  March  - 1 x daily - 7 x weekly - 3 sets  ASSESSMENT:  CLINICAL IMPRESSION: Pt directed through laminated HEP (including Ai Chi).  Pt tolerated all exercises well without increase in pain.  She required minor cues for direction of new Ai Chi exercises.  Pt has met all goals and is pleased with level of function.     OBJECTIVE IMPAIRMENTS: Abnormal gait, decreased balance, decreased coordination, and pain.   ACTIVITY LIMITATIONS: standing and transfers  PARTICIPATION LIMITATIONS: community activity and yard work  PERSONAL FACTORS: Past/current experiences and Time since onset of injury/illness/exacerbation are also affecting patient's functional outcome.   REHAB POTENTIAL: Good  CLINICAL DECISION MAKING: Stable/uncomplicated  EVALUATION COMPLEXITY: Low   GOALS: Goals reviewed with patient? Yes  SHORT TERM GOALS: Target date: 10/28/22  Pt will be indep with aquatic HEP for long time management of chronic condition Baseline:no aquatic program Goal status:MET - 10/22/22  2.  Pt will gain access to pool in order to complete HEP Baseline: no access Goal status: Met 10/09/22  3.  Pt will tolerate aquatic therapy sessions without increase in pain Baseline: TBA Goal status: MET -10/22/22  4.  Pt will report a decrease in overall pain to <4/10  to demonstrate positive response to intervention Baseline:  Goal status: MET - 10/22/22    LONG TERM GOALS: Not appropriate at this time  PLAN:  spoke with supervising PT; will d/c today.   PT FREQUENCY: 1x/week  PT DURATION: 4 weeks  PLANNED INTERVENTIONS: Therapeutic exercises, Therapeutic activity, Neuromuscular re-education, Balance training, Gait training, Patient/Family education, Self Care, Joint mobilization, Stair training, Aquatic Therapy, Dry Needling, Cryotherapy, Moist heat, Taping, Traction, Ionotophoresis 4mg /ml Dexamethasone, Manual therapy, and Re-evaluation.  PLAN FOR NEXT SESSION:  Mayer Camel, PTA 10/22/22  8:28 AM [facility] [address] [place], [place], [address] Phone: [phone]   Fax:  [phone]   Addend Corrie Dandy Tomma Lightning)  [place] MPT 02/03/23 1242p

## 2022-10-26 DIAGNOSIS — M62838 Other muscle spasm: Secondary | ICD-10-CM | POA: Diagnosis not present

## 2022-10-26 DIAGNOSIS — N3941 Urge incontinence: Secondary | ICD-10-CM | POA: Diagnosis not present

## 2022-10-26 DIAGNOSIS — M6281 Muscle weakness (generalized): Secondary | ICD-10-CM | POA: Diagnosis not present

## 2022-10-26 DIAGNOSIS — M6289 Other specified disorders of muscle: Secondary | ICD-10-CM | POA: Diagnosis not present

## 2022-10-28 ENCOUNTER — Ambulatory Visit (HOSPITAL_BASED_OUTPATIENT_CLINIC_OR_DEPARTMENT_OTHER): Payer: PRIVATE HEALTH INSURANCE | Admitting: Physical Therapy

## 2022-11-04 ENCOUNTER — Ambulatory Visit (HOSPITAL_BASED_OUTPATIENT_CLINIC_OR_DEPARTMENT_OTHER): Payer: PRIVATE HEALTH INSURANCE | Admitting: Physical Therapy

## 2022-11-11 ENCOUNTER — Encounter: Payer: Self-pay | Admitting: Podiatry

## 2022-11-11 ENCOUNTER — Ambulatory Visit (INDEPENDENT_AMBULATORY_CARE_PROVIDER_SITE_OTHER): Payer: Medicare Other | Admitting: Podiatry

## 2022-11-11 DIAGNOSIS — B351 Tinea unguium: Secondary | ICD-10-CM

## 2022-11-11 DIAGNOSIS — I7300 Raynaud's syndrome without gangrene: Secondary | ICD-10-CM

## 2022-11-11 NOTE — Progress Notes (Signed)
Subjective:   Patient ID: Felicia Cain, female   DOB: 67 y.o.   MRN: [id number]   HPI Patient presents concerned about big toenail thickness and the fact she has psoriasis.  States that she has tried Lamisil without success also has orthotics she like recovered   Review of Systems  All other systems reviewed and are negative.       Objective:  Physical Exam Vitals and nursing note reviewed.  Constitutional:      Appearance: She is well-developed.  Pulmonary:     Effort: Pulmonary effort is normal.  Musculoskeletal:        General: Normal range of motion.  Skin:    General: Skin is warm.  Neurological:     Mental Status: She is alert.     Neurovascular status found to be intact muscle strength adequate range of motion adequate with nail thickness both feet of a mild nature with irritation more distal which appears to be more in skin which may be due to psoriasis with somewhat chronic foot problems and structural changes.  Good digital perfusion fusion     Assessment:  Patient has a lot of concerns associated with the nails thickness and other pathology     Plan:  H&P reviewed spent a great deal time going over with her the types of structure she has and I do not recommend different procedures on this I would rather see if it would get better conservatively and begin soaks and continue Kerasal.  We Argun to get several pair of orthotics recovered I reviewed that process with her

## 2022-11-12 ENCOUNTER — Ambulatory Visit (HOSPITAL_BASED_OUTPATIENT_CLINIC_OR_DEPARTMENT_OTHER): Payer: PRIVATE HEALTH INSURANCE | Admitting: Physical Therapy

## 2022-11-18 ENCOUNTER — Ambulatory Visit (HOSPITAL_BASED_OUTPATIENT_CLINIC_OR_DEPARTMENT_OTHER): Payer: PRIVATE HEALTH INSURANCE | Admitting: Physical Therapy

## 2022-11-19 ENCOUNTER — Ambulatory Visit (HOSPITAL_BASED_OUTPATIENT_CLINIC_OR_DEPARTMENT_OTHER): Payer: PRIVATE HEALTH INSURANCE | Admitting: Physical Therapy

## 2022-11-23 ENCOUNTER — Other Ambulatory Visit (HOSPITAL_COMMUNITY): Payer: Self-pay

## 2022-11-23 DIAGNOSIS — Q666 Other congenital valgus deformities of feet: Secondary | ICD-10-CM

## 2022-11-25 ENCOUNTER — Ambulatory Visit (HOSPITAL_BASED_OUTPATIENT_CLINIC_OR_DEPARTMENT_OTHER): Payer: PRIVATE HEALTH INSURANCE | Admitting: Physical Therapy

## 2022-11-25 DIAGNOSIS — H0288A Meibomian gland dysfunction right eye, upper and lower eyelids: Secondary | ICD-10-CM | POA: Diagnosis not present

## 2022-11-25 DIAGNOSIS — H1013 Acute atopic conjunctivitis, bilateral: Secondary | ICD-10-CM | POA: Diagnosis not present

## 2022-11-25 DIAGNOSIS — H5713 Ocular pain, bilateral: Secondary | ICD-10-CM | POA: Diagnosis not present

## 2022-11-25 DIAGNOSIS — H11823 Conjunctivochalasis, bilateral: Secondary | ICD-10-CM | POA: Diagnosis not present

## 2022-11-25 DIAGNOSIS — H0288B Meibomian gland dysfunction left eye, upper and lower eyelids: Secondary | ICD-10-CM | POA: Diagnosis not present

## 2022-12-10 DIAGNOSIS — M9908 Segmental and somatic dysfunction of rib cage: Secondary | ICD-10-CM | POA: Diagnosis not present

## 2022-12-10 DIAGNOSIS — E559 Vitamin D deficiency, unspecified: Secondary | ICD-10-CM | POA: Diagnosis not present

## 2022-12-10 DIAGNOSIS — I7300 Raynaud's syndrome without gangrene: Secondary | ICD-10-CM | POA: Diagnosis not present

## 2022-12-10 DIAGNOSIS — M25551 Pain in right hip: Secondary | ICD-10-CM | POA: Diagnosis not present

## 2022-12-10 DIAGNOSIS — Z79899 Other long term (current) drug therapy: Secondary | ICD-10-CM | POA: Diagnosis not present

## 2022-12-10 DIAGNOSIS — R269 Unspecified abnormalities of gait and mobility: Secondary | ICD-10-CM | POA: Diagnosis not present

## 2022-12-10 DIAGNOSIS — M25571 Pain in right ankle and joints of right foot: Secondary | ICD-10-CM | POA: Diagnosis not present

## 2022-12-10 DIAGNOSIS — M542 Cervicalgia: Secondary | ICD-10-CM | POA: Diagnosis not present

## 2022-12-10 DIAGNOSIS — M25572 Pain in left ankle and joints of left foot: Secondary | ICD-10-CM | POA: Diagnosis not present

## 2022-12-10 DIAGNOSIS — K219 Gastro-esophageal reflux disease without esophagitis: Secondary | ICD-10-CM | POA: Diagnosis not present

## 2022-12-10 DIAGNOSIS — M810 Age-related osteoporosis without current pathological fracture: Secondary | ICD-10-CM | POA: Diagnosis not present

## 2022-12-10 DIAGNOSIS — R251 Tremor, unspecified: Secondary | ICD-10-CM | POA: Diagnosis not present

## 2022-12-10 DIAGNOSIS — E7800 Pure hypercholesterolemia, unspecified: Secondary | ICD-10-CM | POA: Diagnosis not present

## 2022-12-10 DIAGNOSIS — M9903 Segmental and somatic dysfunction of lumbar region: Secondary | ICD-10-CM | POA: Diagnosis not present

## 2022-12-10 DIAGNOSIS — G629 Polyneuropathy, unspecified: Secondary | ICD-10-CM | POA: Diagnosis not present

## 2022-12-10 DIAGNOSIS — M9904 Segmental and somatic dysfunction of sacral region: Secondary | ICD-10-CM | POA: Diagnosis not present

## 2022-12-10 DIAGNOSIS — M9905 Segmental and somatic dysfunction of pelvic region: Secondary | ICD-10-CM | POA: Diagnosis not present

## 2022-12-10 DIAGNOSIS — Z0000 Encounter for general adult medical examination without abnormal findings: Secondary | ICD-10-CM | POA: Diagnosis not present

## 2022-12-10 DIAGNOSIS — M0609 Rheumatoid arthritis without rheumatoid factor, multiple sites: Secondary | ICD-10-CM | POA: Diagnosis not present

## 2022-12-10 DIAGNOSIS — M9902 Segmental and somatic dysfunction of thoracic region: Secondary | ICD-10-CM | POA: Diagnosis not present

## 2022-12-10 DIAGNOSIS — E162 Hypoglycemia, unspecified: Secondary | ICD-10-CM | POA: Diagnosis not present

## 2022-12-10 DIAGNOSIS — M9907 Segmental and somatic dysfunction of upper extremity: Secondary | ICD-10-CM | POA: Diagnosis not present

## 2022-12-10 DIAGNOSIS — M9906 Segmental and somatic dysfunction of lower extremity: Secondary | ICD-10-CM | POA: Diagnosis not present

## 2022-12-15 ENCOUNTER — Other Ambulatory Visit: Payer: Self-pay | Admitting: Neurology

## 2022-12-23 DIAGNOSIS — L03032 Cellulitis of left toe: Secondary | ICD-10-CM | POA: Diagnosis not present

## 2022-12-23 DIAGNOSIS — L719 Rosacea, unspecified: Secondary | ICD-10-CM | POA: Diagnosis not present

## 2022-12-23 DIAGNOSIS — L661 Lichen planopilaris: Secondary | ICD-10-CM | POA: Diagnosis not present

## 2022-12-23 DIAGNOSIS — L309 Dermatitis, unspecified: Secondary | ICD-10-CM | POA: Diagnosis not present

## 2022-12-23 DIAGNOSIS — L859 Epidermal thickening, unspecified: Secondary | ICD-10-CM | POA: Diagnosis not present

## 2022-12-30 DIAGNOSIS — M9904 Segmental and somatic dysfunction of sacral region: Secondary | ICD-10-CM | POA: Diagnosis not present

## 2022-12-30 DIAGNOSIS — M9905 Segmental and somatic dysfunction of pelvic region: Secondary | ICD-10-CM | POA: Diagnosis not present

## 2022-12-30 DIAGNOSIS — M9902 Segmental and somatic dysfunction of thoracic region: Secondary | ICD-10-CM | POA: Diagnosis not present

## 2022-12-30 DIAGNOSIS — M9906 Segmental and somatic dysfunction of lower extremity: Secondary | ICD-10-CM | POA: Diagnosis not present

## 2022-12-30 DIAGNOSIS — M5451 Vertebrogenic low back pain: Secondary | ICD-10-CM | POA: Diagnosis not present

## 2022-12-30 DIAGNOSIS — M6281 Muscle weakness (generalized): Secondary | ICD-10-CM | POA: Diagnosis not present

## 2022-12-30 DIAGNOSIS — M25552 Pain in left hip: Secondary | ICD-10-CM | POA: Diagnosis not present

## 2022-12-30 DIAGNOSIS — M25551 Pain in right hip: Secondary | ICD-10-CM | POA: Diagnosis not present

## 2022-12-30 DIAGNOSIS — R42 Dizziness and giddiness: Secondary | ICD-10-CM | POA: Diagnosis not present

## 2022-12-30 DIAGNOSIS — M9903 Segmental and somatic dysfunction of lumbar region: Secondary | ICD-10-CM | POA: Diagnosis not present

## 2022-12-31 DIAGNOSIS — H0288A Meibomian gland dysfunction right eye, upper and lower eyelids: Secondary | ICD-10-CM | POA: Diagnosis not present

## 2022-12-31 DIAGNOSIS — H47291 Other optic atrophy, right eye: Secondary | ICD-10-CM | POA: Diagnosis not present

## 2022-12-31 DIAGNOSIS — L718 Other rosacea: Secondary | ICD-10-CM | POA: Diagnosis not present

## 2022-12-31 DIAGNOSIS — H40002 Preglaucoma, unspecified, left eye: Secondary | ICD-10-CM | POA: Diagnosis not present

## 2022-12-31 DIAGNOSIS — H40001 Preglaucoma, unspecified, right eye: Secondary | ICD-10-CM | POA: Diagnosis not present

## 2022-12-31 DIAGNOSIS — H11823 Conjunctivochalasis, bilateral: Secondary | ICD-10-CM | POA: Diagnosis not present

## 2022-12-31 DIAGNOSIS — H0288B Meibomian gland dysfunction left eye, upper and lower eyelids: Secondary | ICD-10-CM | POA: Diagnosis not present

## 2023-01-05 DIAGNOSIS — H11821 Conjunctivochalasis, right eye: Secondary | ICD-10-CM | POA: Diagnosis not present

## 2023-01-11 DIAGNOSIS — M791 Myalgia, unspecified site: Secondary | ICD-10-CM | POA: Diagnosis not present

## 2023-01-11 DIAGNOSIS — M1990 Unspecified osteoarthritis, unspecified site: Secondary | ICD-10-CM | POA: Diagnosis not present

## 2023-01-11 DIAGNOSIS — M7701 Medial epicondylitis, right elbow: Secondary | ICD-10-CM | POA: Diagnosis not present

## 2023-01-11 DIAGNOSIS — Z79899 Other long term (current) drug therapy: Secondary | ICD-10-CM | POA: Diagnosis not present

## 2023-01-11 DIAGNOSIS — I7300 Raynaud's syndrome without gangrene: Secondary | ICD-10-CM | POA: Diagnosis not present

## 2023-01-11 DIAGNOSIS — L661 Lichen planopilaris: Secondary | ICD-10-CM | POA: Diagnosis not present

## 2023-01-11 DIAGNOSIS — K219 Gastro-esophageal reflux disease without esophagitis: Secondary | ICD-10-CM | POA: Diagnosis not present

## 2023-01-11 DIAGNOSIS — L409 Psoriasis, unspecified: Secondary | ICD-10-CM | POA: Diagnosis not present

## 2023-01-11 DIAGNOSIS — M654 Radial styloid tenosynovitis [de Quervain]: Secondary | ICD-10-CM | POA: Diagnosis not present

## 2023-01-11 DIAGNOSIS — L4050 Arthropathic psoriasis, unspecified: Secondary | ICD-10-CM | POA: Diagnosis not present

## 2023-01-19 DIAGNOSIS — H11821 Conjunctivochalasis, right eye: Secondary | ICD-10-CM | POA: Diagnosis not present

## 2023-01-25 ENCOUNTER — Other Ambulatory Visit: Payer: Self-pay | Admitting: Neurology

## 2023-01-25 MED ORDER — MODAFINIL 100 MG PO TABS: 100.0000 mg | ORAL_TABLET | Freq: Every day | ORAL | 2 refills | Status: DC

## 2023-01-25 NOTE — Telephone Encounter (Signed)
[facility] checked, last filled 12/02/22 30tabs. Last seen 06/09/23, upcoming 06/14/23. Refill is appropriate.

## 2023-01-25 NOTE — Telephone Encounter (Signed)
Pt stated she need a new prescription sent to   [id number]   for modafinil (PROVIGIL) 100 MG tablet.

## 2023-01-28 DIAGNOSIS — M9900 Segmental and somatic dysfunction of head region: Secondary | ICD-10-CM | POA: Diagnosis not present

## 2023-01-28 DIAGNOSIS — S060X0A Concussion without loss of consciousness, initial encounter: Secondary | ICD-10-CM | POA: Diagnosis not present

## 2023-01-28 DIAGNOSIS — M9908 Segmental and somatic dysfunction of rib cage: Secondary | ICD-10-CM | POA: Diagnosis not present

## 2023-01-28 DIAGNOSIS — M542 Cervicalgia: Secondary | ICD-10-CM | POA: Diagnosis not present

## 2023-01-28 DIAGNOSIS — M7912 Myalgia of auxiliary muscles, head and neck: Secondary | ICD-10-CM | POA: Diagnosis not present

## 2023-01-28 DIAGNOSIS — M9907 Segmental and somatic dysfunction of upper extremity: Secondary | ICD-10-CM | POA: Diagnosis not present

## 2023-01-28 DIAGNOSIS — M9901 Segmental and somatic dysfunction of cervical region: Secondary | ICD-10-CM | POA: Diagnosis not present

## 2023-01-28 DIAGNOSIS — R531 Weakness: Secondary | ICD-10-CM | POA: Diagnosis not present

## 2023-02-04 DIAGNOSIS — M654 Radial styloid tenosynovitis [de Quervain]: Secondary | ICD-10-CM | POA: Diagnosis not present

## 2023-02-17 DIAGNOSIS — M542 Cervicalgia: Secondary | ICD-10-CM | POA: Diagnosis not present

## 2023-02-17 DIAGNOSIS — R42 Dizziness and giddiness: Secondary | ICD-10-CM | POA: Diagnosis not present

## 2023-02-17 DIAGNOSIS — M9906 Segmental and somatic dysfunction of lower extremity: Secondary | ICD-10-CM | POA: Diagnosis not present

## 2023-02-17 DIAGNOSIS — M9901 Segmental and somatic dysfunction of cervical region: Secondary | ICD-10-CM | POA: Diagnosis not present

## 2023-02-17 DIAGNOSIS — M5412 Radiculopathy, cervical region: Secondary | ICD-10-CM | POA: Diagnosis not present

## 2023-02-17 DIAGNOSIS — M25572 Pain in left ankle and joints of left foot: Secondary | ICD-10-CM | POA: Diagnosis not present

## 2023-02-17 DIAGNOSIS — M9902 Segmental and somatic dysfunction of thoracic region: Secondary | ICD-10-CM | POA: Diagnosis not present

## 2023-02-18 ENCOUNTER — Other Ambulatory Visit: Payer: Self-pay | Admitting: Sports Medicine

## 2023-02-18 DIAGNOSIS — M79672 Pain in left foot: Secondary | ICD-10-CM

## 2023-03-05 DIAGNOSIS — M9906 Segmental and somatic dysfunction of lower extremity: Secondary | ICD-10-CM | POA: Diagnosis not present

## 2023-03-05 DIAGNOSIS — M5412 Radiculopathy, cervical region: Secondary | ICD-10-CM | POA: Diagnosis not present

## 2023-03-05 DIAGNOSIS — M9905 Segmental and somatic dysfunction of pelvic region: Secondary | ICD-10-CM | POA: Diagnosis not present

## 2023-03-05 DIAGNOSIS — M7912 Myalgia of auxiliary muscles, head and neck: Secondary | ICD-10-CM | POA: Diagnosis not present

## 2023-03-05 DIAGNOSIS — S060X0D Concussion without loss of consciousness, subsequent encounter: Secondary | ICD-10-CM | POA: Diagnosis not present

## 2023-03-05 DIAGNOSIS — M9903 Segmental and somatic dysfunction of lumbar region: Secondary | ICD-10-CM | POA: Diagnosis not present

## 2023-03-05 DIAGNOSIS — M9908 Segmental and somatic dysfunction of rib cage: Secondary | ICD-10-CM | POA: Diagnosis not present

## 2023-03-05 DIAGNOSIS — M9904 Segmental and somatic dysfunction of sacral region: Secondary | ICD-10-CM | POA: Diagnosis not present

## 2023-03-05 DIAGNOSIS — M542 Cervicalgia: Secondary | ICD-10-CM | POA: Diagnosis not present

## 2023-03-05 DIAGNOSIS — R2681 Unsteadiness on feet: Secondary | ICD-10-CM | POA: Diagnosis not present

## 2023-03-05 DIAGNOSIS — M25572 Pain in left ankle and joints of left foot: Secondary | ICD-10-CM | POA: Diagnosis not present

## 2023-03-05 DIAGNOSIS — M545 Low back pain, unspecified: Secondary | ICD-10-CM | POA: Diagnosis not present

## 2023-03-09 DIAGNOSIS — M25531 Pain in right wrist: Secondary | ICD-10-CM | POA: Diagnosis not present

## 2023-03-09 DIAGNOSIS — M180 Bilateral primary osteoarthritis of first carpometacarpal joints: Secondary | ICD-10-CM | POA: Diagnosis not present

## 2023-03-09 DIAGNOSIS — M654 Radial styloid tenosynovitis [de Quervain]: Secondary | ICD-10-CM | POA: Diagnosis not present

## 2023-03-09 DIAGNOSIS — M25532 Pain in left wrist: Secondary | ICD-10-CM | POA: Diagnosis not present

## 2023-03-15 DIAGNOSIS — M9908 Segmental and somatic dysfunction of rib cage: Secondary | ICD-10-CM | POA: Diagnosis not present

## 2023-03-15 DIAGNOSIS — M9903 Segmental and somatic dysfunction of lumbar region: Secondary | ICD-10-CM | POA: Diagnosis not present

## 2023-03-15 DIAGNOSIS — M546 Pain in thoracic spine: Secondary | ICD-10-CM | POA: Diagnosis not present

## 2023-03-15 DIAGNOSIS — M545 Low back pain, unspecified: Secondary | ICD-10-CM | POA: Diagnosis not present

## 2023-03-16 DIAGNOSIS — H11822 Conjunctivochalasis, left eye: Secondary | ICD-10-CM | POA: Diagnosis not present

## 2023-03-18 ENCOUNTER — Ambulatory Visit: Payer: Medicare Other | Admitting: Physical Therapy

## 2023-03-30 DIAGNOSIS — H11822 Conjunctivochalasis, left eye: Secondary | ICD-10-CM | POA: Diagnosis not present

## 2023-03-30 DIAGNOSIS — H47291 Other optic atrophy, right eye: Secondary | ICD-10-CM | POA: Diagnosis not present

## 2023-03-31 DIAGNOSIS — M25472 Effusion, left ankle: Secondary | ICD-10-CM | POA: Diagnosis not present

## 2023-03-31 DIAGNOSIS — M898X7 Other specified disorders of bone, ankle and foot: Secondary | ICD-10-CM | POA: Diagnosis not present

## 2023-03-31 DIAGNOSIS — S92155A Nondisplaced avulsion fracture (chip fracture) of left talus, initial encounter for closed fracture: Secondary | ICD-10-CM | POA: Diagnosis not present

## 2023-03-31 DIAGNOSIS — R600 Localized edema: Secondary | ICD-10-CM | POA: Diagnosis not present

## 2023-04-06 DIAGNOSIS — Z853 Personal history of malignant neoplasm of breast: Secondary | ICD-10-CM | POA: Diagnosis not present

## 2023-04-06 DIAGNOSIS — Z9013 Acquired absence of bilateral breasts and nipples: Secondary | ICD-10-CM | POA: Diagnosis not present

## 2023-04-06 DIAGNOSIS — M810 Age-related osteoporosis without current pathological fracture: Secondary | ICD-10-CM | POA: Diagnosis not present

## 2023-04-06 DIAGNOSIS — Z01419 Encounter for gynecological examination (general) (routine) without abnormal findings: Secondary | ICD-10-CM | POA: Diagnosis not present

## 2023-04-06 DIAGNOSIS — G20A1 Parkinson's disease without dyskinesia, without mention of fluctuations: Secondary | ICD-10-CM | POA: Diagnosis not present

## 2023-04-06 DIAGNOSIS — I10 Essential (primary) hypertension: Secondary | ICD-10-CM | POA: Diagnosis not present

## 2023-04-07 ENCOUNTER — Other Ambulatory Visit: Payer: Self-pay | Admitting: Sports Medicine

## 2023-04-07 DIAGNOSIS — M25572 Pain in left ankle and joints of left foot: Secondary | ICD-10-CM | POA: Diagnosis not present

## 2023-04-07 DIAGNOSIS — M79672 Pain in left foot: Secondary | ICD-10-CM | POA: Diagnosis not present

## 2023-04-07 DIAGNOSIS — M9900 Segmental and somatic dysfunction of head region: Secondary | ICD-10-CM | POA: Diagnosis not present

## 2023-04-07 DIAGNOSIS — R42 Dizziness and giddiness: Secondary | ICD-10-CM | POA: Diagnosis not present

## 2023-04-07 DIAGNOSIS — R2681 Unsteadiness on feet: Secondary | ICD-10-CM | POA: Diagnosis not present

## 2023-04-07 DIAGNOSIS — H5500 Unspecified nystagmus: Secondary | ICD-10-CM | POA: Diagnosis not present

## 2023-04-07 DIAGNOSIS — M542 Cervicalgia: Secondary | ICD-10-CM | POA: Diagnosis not present

## 2023-04-07 DIAGNOSIS — M9908 Segmental and somatic dysfunction of rib cage: Secondary | ICD-10-CM | POA: Diagnosis not present

## 2023-04-07 DIAGNOSIS — R531 Weakness: Secondary | ICD-10-CM | POA: Diagnosis not present

## 2023-04-07 DIAGNOSIS — M9901 Segmental and somatic dysfunction of cervical region: Secondary | ICD-10-CM | POA: Diagnosis not present

## 2023-04-13 ENCOUNTER — Ambulatory Visit: Payer: Medicare Other | Attending: Psychiatry

## 2023-04-13 DIAGNOSIS — R2681 Unsteadiness on feet: Secondary | ICD-10-CM | POA: Diagnosis not present

## 2023-04-13 DIAGNOSIS — M542 Cervicalgia: Secondary | ICD-10-CM | POA: Insufficient documentation

## 2023-04-13 DIAGNOSIS — R42 Dizziness and giddiness: Secondary | ICD-10-CM | POA: Diagnosis not present

## 2023-04-13 NOTE — Therapy (Unsigned)
OUTPATIENT PHYSICAL THERAPY VESTIBULAR EVALUATION     Patient Name: Felicia Cain MRN: [id number] DOB:[birth year], 67 y.o., female Today's Date: 04/14/2023  END OF SESSION:  PT End of Session - 04/13/23 1534     Visit Number 1    Number of Visits 13    Date for PT Re-Evaluation 05/26/23    Authorization Type Medicare/Medicaid    Progress Note Due on Visit 10    PT Start Time 1530    PT Stop Time 1615    PT Time Calculation (min) 45 min             Past Medical History:  Diagnosis Date   Anxiety    Arthritis    "back" (01/25/2017)   Brachial neuritis    neuropathy   Chronic lower back pain    DCIS (ductal carcinoma in situ)    "left side?"   Esophageal stricture    stricture with dysphasia   Fibromyalgia    GERD (gastroesophageal reflux disease)    H/O Doppler ultrasound 2006   see scanned study   H/O echocardiogram 2004, 2013   Hepatitis A 1961   [place]epidemic in my city"   History of cardiac monitoring 2006   History of nuclear stress test 2004   see scanned study   Migraine    "in the past; cycle related" (01/25/2017)   Myasthenia gravis in crisis (HCC) 04/2014   respiratory crisis   NAION (non-arteritic anterior ischemic optic neuropathy), right    Near syncope    Neuromuscular disorder (HCC)    Neuropathy    Optic neuritis    ischaemic optic neuritis, non arteric   Optic neuropathy    PONV (postoperative nausea and vomiting)    Posterior optic neuritis    Ptosis of eyelid    Sleep apnea    "I wear Moses device; I don't wear CPAP" (01/25/2017)   Spinal stenosis    Past Surgical History:  Procedure Laterality Date   AUGMENTATION MAMMAPLASTY     2000, after breast cancer surgery,redone 2010   BACK SURGERY     BREAST BIOPSY     BREAST CAPSULECTOMY WITH IMPLANT EXCHANGE Bilateral 09/28/2018   Procedure: REMOVAL OF BILATERAL BREAST IMPLANTS WITH CAPSULECTOMIES AND REPLACEMENTS OF IMPLANTS;  Surgeon: Peggye Form, DO;  Location: [facility];  Service: Plastics;  Laterality: Bilateral;   CARPAL TUNNEL RELEASE Bilateral    CESAREAN SECTION  1989   ESOPHAGOGASTRODUODENOSCOPY (EGD) WITH ESOPHAGEAL DILATION     HIP PINNING,CANNULATED Right 01/26/2017   Procedure: CANNULATED SCREWS/RIGHT HIP PINNING;  Surgeon: Kathryne Hitch, MD;  Location: [facility];  Service: Orthopedics;  Laterality: Right;   INNER EAR SURGERY Right 1995   LUMBAR LAMINECTOMY Right 1999   Dr Venetia Maxon   MASTECTOMY Bilateral    Patient Active Problem List   Diagnosis Date Noted   Pain in left elbow 10/21/2021   Pain in right elbow 10/21/2021   Tremor of right hand 07/23/2021   Titubation 07/23/2021   Weakness of both lower extremities 01/20/2021   Gait instability 01/20/2021   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/20/2019   Peroneal neuropathy at knee, left 03/19/2019   Patellar contusion, left, sequela 02/09/2019   Rib pain on right side 11/24/2018   Acquired absence of both breasts 09/20/2018   Ruptured right breast implant 08/26/2018   History of reconstruction of both breasts 07/08/2018   Breast implant capsular contracture 07/08/2018   Breast pain 07/08/2018  Retrognathia 09/15/2017   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   Status post-operative repair of closed fracture of right hip 09/08/2017   Acute pain of right knee 05/24/2017   Hip fracture (HCC) 01/25/2017   Closed hip fracture, right, initial encounter (HCC)    Preop examination    Fibromyalgia    Neuropathy    Corneal abrasion    Nausea    Fall    Seronegative myasthenia gravis (HCC) 07/19/2015   Acute exacerbation of chronic low back pain 07/19/2015   Leukocytosis 07/19/2015   Myasthenia gravis in remission (HCC) 10/23/2014   OSA on CPAP 10/23/2014   Hypersomnia, persistent 10/23/2014   Hypoxemia 08/14/2014   Myasthenia gravis with exacerbation (HCC) 08/14/2014   Myasthenia gravis with exacerbation, ocular (HCC)  12/08/2012   Right shoulder pain 08/02/2012   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   History of optic neuritis 06/27/2012   Rosacea 06/27/2012   S/P laminectomy 06/27/2012   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 I am supposed to get an MRI but this office called me first." Pt reports recent fall in end of May and suffering a concussion when she fell backwards and struck posterior of head.  Pt notes that current symptoms cause dizziness/nausea with head and position changes.  Notes driving is problematic and having difficulty with depth perception.  Pt reports issues with right eye with 85% visual impairment.   Pt has been seeing Dr. Berline Chough for concussion mgmt via manual/manipulative therapy.  Pt reports no medication mgmt Pt accompanied by: self  PERTINENT HISTORY: Had right perilymph fistula rupture and repair in the right ear back in the 90's, PD, hx of femur fx and ORIF,   PAIN:  Are you having pain? Yes: NPRS scale: 8/10 Pain location: posterior /neck-subocciptial Pain description: shooting Aggravating factors: movement, more to the left Relieving factors: immobility  PRECAUTIONS: None  RED FLAGS: None   WEIGHT BEARING RESTRICTIONS: No  FALLS: Has patient fallen in last 6 months? Yes. Number of falls 1  LIVING ENVIRONMENT: Lives with: lives with their family, lives with elderly  mother Lives in: House/apartment Stairs: No Has following equipment at home: Single point cane, rarely  PLOF: Independent  PATIENT GOALS:   OBJECTIVE:   DIAGNOSTIC FINDINGS: pt reports upcoming MRI of head and neck  COGNITION: Overall cognitive status: Within functional limits for tasks assessed   SENSATION: WFL  EDEMA:    MUSCLE TONE:  NT  DTRs:  NT  POSTURE:  No Significant postural limitations  Cervical ROM:    Active A/PROM (deg) eval  Flexion 40  Extension 40  Right lateral flexion 40  Left lateral flexion 40  Right rotation 45  Left rotation 40  (Blank rows = not tested)  +Spurling's sign LUE>RUE  STRENGTH: NT  LOWER EXTREMITY MMT:    BED MOBILITY:  indep  TRANSFERS: Assistive device utilized: None  Sit to stand: Modified independence Stand to sit:  Modified independence Chair to chair: Modified independence Floor:  NT Currently wearing left low calf rocker boot due to ankle issues   GAIT: Gait pattern:  altered due to low calf rocker boot, pt reports LE derangement from hx of fracture and malpositioning Distance walked:  Assistive device utilized: None Level of assistance: Complete Independence and Modified independence Comments:    PATIENT SURVEYS:  FOTO 53 (predicted 61)  VESTIBULAR ASSESSMENT:  GENERAL OBSERVATION: wears progressive lense   SYMPTOM BEHAVIOR:  Subjective history: backwards fall end of May striking posterior head  Non-Vestibular symptoms: neck pain, headaches, and tinnitus  Type of dizziness: Unsteady with head/body turns, Lightheadedness/Faint, and nausea  Frequency: daily  Duration: daily/constant with varying intensity  Aggravating factors: Induced by position change: rolling to the left, supine to sit, and sit to stand, Induced by motion: looking up at the ceiling, turning head quickly, driving, and activity in general, Worse outside or in busy environment, and change of positions  Relieving factors: slow  movements and c-spine manipulation  Progression of symptoms: unchanged  OCULOMOTOR EXAM:  Ocular Alignment: normal  Ocular ROM: No Limitations  Spontaneous Nystagmus: absent  Gaze-Induced Nystagmus: absent  Smooth Pursuits: saccades--more prominent with tracking from left to right  Saccades: hypermetric/overshoots  Convergence/Divergence: 7 cm  ---possibly observed several instances of downward/vertical nystagmus with smooth pursuits testing    VESTIBULAR - OCULAR REFLEX:   Slow VOR: Comment: horizontal provoking of symptoms (nausea, heavy head) vertical not so problematic   VOR Cancellation: Corrective Saccades  Head-Impulse Test: HIT Right: positive HIT Left: negative  Dynamic Visual Acuity: Not able to be assessed   POSITIONAL TESTING: Right Dix-Hallpike: no nystagmus Left Dix-Hallpike: no nystagmus Right Roll Test: no nystagmus Left Roll Test: no nystagmus  MOTION SENSITIVITY:  Motion Sensitivity Quotient Intensity: 0 = none, 1 = Lightheaded, 2 = Mild, 3 = Moderate, 4 = Severe, 5 = Vomiting  Intensity  1. Sitting to supine   2. Supine to L side   3. Supine to R side   4. Supine to sitting   5. L Hallpike-Dix   6. Up from L    7. R Hallpike-Dix   8. Up from R    9. Sitting, head tipped to L knee   10. Head up from L knee   11. Sitting, head tipped to R knee   12. Head up from R knee   13. Sitting head turns x5   14.Sitting head nods x5   15. In stance, 180 turn to L    16. In stance, 180 turn to R     OTHOSTATICS: not done  FUNCTIONAL GAIT: Functional gait assessment: TBD M-CTSIB: TBD    PATIENT EDUCATION: Education details: discussion of PT services regarding concussion mgmt and relevant activities Person educated: Patient Education method: Explanation Education comprehension: verbalized understanding  HOME EXERCISE PROGRAM:  GOALS: Goals reviewed with patient? Yes  SHORT TERM GOALS: Target date: 05/04/2023    The patient will be independent  with HEP for gaze adaptation, habituation, balance, and general mobility. Baseline: Goal status: INITIAL  2.  Patient to report neck pain not exceeding 4/10 with activities to improve recovery and enable greater degree of post-concussion therapeutic activities/exercise Baseline: 8/10 Goal status: INITIAL    LONG TERM GOALS: Target date: 05/26/2023    Independent with advanced HEP for concussion-specific and general activity performance per her PLOF Baseline:  Goal status: INITIAL  2.  VOMS assessment not to provoke symptoms > 2/10 to manifest improved activity  tolerance Baseline: TBD Goal status: INITIAL  3.  Demo improved postural stability per mild sway condition 4 M-CTSIB for greater safety during ADL Baseline: TBD Goal status: INITIAL  4.  Demo improved cervical rotation to 50 degrees left/right for improved comfort/safety during driving Baseline: 40-98 deg Goal status: INITIAL    ASSESSMENT:  CLINICAL IMPRESSION: Patient is a 67 y.o. lady who was seen today for physical therapy evaluation and treatment for dizziness and neck pain following fall and striking head.  Vestibular assessment remarkable for abnormal oculomotor exam, positional and motion sensitivity, neck pain and limited ROM, and general decline in functional activity tolerance due to nature of symptoms.  Patient would benefit from PT services to address deficits and limitations to facilitate greater activity tolerance and return to regular level of activity.  OBJECTIVE IMPAIRMENTS: Abnormal gait, decreased activity tolerance, decreased balance, difficulty walking, decreased ROM, dizziness, increased muscle spasms, and pain.   ACTIVITY LIMITATIONS: bending, sleeping, reach over head, and locomotion level  PARTICIPATION LIMITATIONS: meal prep, cleaning, laundry, driving, shopping, and community activity  PERSONAL FACTORS: Time since onset of injury/illness/exacerbation and 1-2 comorbidities: PMH  are also  affecting patient's functional outcome.   REHAB POTENTIAL: Good  CLINICAL DECISION MAKING: Evolving/moderate complexity  EVALUATION COMPLEXITY: Moderate   PLAN:  PT FREQUENCY: 1-2x/week  PT DURATION: 6 weeks  PLANNED INTERVENTIONS: Therapeutic exercises, Therapeutic activity, Neuromuscular re-education, Balance training, Gait training, Patient/Family education, Self Care, Joint mobilization, Stair training, Vestibular training, Canalith repositioning, DME instructions, Aquatic Therapy, Dry Needling, Electrical stimulation, Spinal mobilization, Cryotherapy, Moist heat, Taping, Traction, Ultrasound, and Manual therapy  PLAN FOR NEXT SESSION: VOMS, M-CTSIB, FGA if walking is more feasible (fracture boot)   2:44 PM, 04/14/23 M. Shary Decamp, PT, DPT Physical Therapist- [place] Office Number: [phone]

## 2023-04-16 DIAGNOSIS — I739 Peripheral vascular disease, unspecified: Secondary | ICD-10-CM | POA: Diagnosis not present

## 2023-04-20 DIAGNOSIS — G20B2 Parkinson's disease with dyskinesia, with fluctuations: Secondary | ICD-10-CM | POA: Diagnosis not present

## 2023-04-20 DIAGNOSIS — R2681 Unsteadiness on feet: Secondary | ICD-10-CM | POA: Diagnosis not present

## 2023-04-20 DIAGNOSIS — M79672 Pain in left foot: Secondary | ICD-10-CM | POA: Diagnosis not present

## 2023-04-20 DIAGNOSIS — M9906 Segmental and somatic dysfunction of lower extremity: Secondary | ICD-10-CM | POA: Diagnosis not present

## 2023-04-20 DIAGNOSIS — M9901 Segmental and somatic dysfunction of cervical region: Secondary | ICD-10-CM | POA: Diagnosis not present

## 2023-04-20 DIAGNOSIS — M79605 Pain in left leg: Secondary | ICD-10-CM | POA: Diagnosis not present

## 2023-04-22 DIAGNOSIS — L409 Psoriasis, unspecified: Secondary | ICD-10-CM | POA: Diagnosis not present

## 2023-04-22 DIAGNOSIS — L820 Inflamed seborrheic keratosis: Secondary | ICD-10-CM | POA: Diagnosis not present

## 2023-04-22 DIAGNOSIS — L570 Actinic keratosis: Secondary | ICD-10-CM | POA: Diagnosis not present

## 2023-04-28 ENCOUNTER — Ambulatory Visit: Payer: PRIVATE HEALTH INSURANCE | Admitting: Sports Medicine

## 2023-05-05 DIAGNOSIS — N952 Postmenopausal atrophic vaginitis: Secondary | ICD-10-CM | POA: Diagnosis not present

## 2023-05-05 DIAGNOSIS — R350 Frequency of micturition: Secondary | ICD-10-CM | POA: Diagnosis not present

## 2023-05-05 DIAGNOSIS — N3941 Urge incontinence: Secondary | ICD-10-CM | POA: Diagnosis not present

## 2023-05-07 ENCOUNTER — Other Ambulatory Visit (HOSPITAL_BASED_OUTPATIENT_CLINIC_OR_DEPARTMENT_OTHER): Payer: Self-pay

## 2023-05-07 DIAGNOSIS — Z23 Encounter for immunization: Secondary | ICD-10-CM | POA: Diagnosis not present

## 2023-05-07 MED ORDER — FLUAD 0.5 ML IM SUSY
0.5000 mL | PREFILLED_SYRINGE | Freq: Once | INTRAMUSCULAR | 0 refills | Status: AC
  Filled 2023-05-07 – 2023-05-12 (×2): qty 0.5, 1d supply, fill #0

## 2023-05-12 ENCOUNTER — Other Ambulatory Visit (HOSPITAL_BASED_OUTPATIENT_CLINIC_OR_DEPARTMENT_OTHER): Payer: Self-pay

## 2023-05-12 DIAGNOSIS — Z23 Encounter for immunization: Secondary | ICD-10-CM | POA: Diagnosis not present

## 2023-05-17 ENCOUNTER — Other Ambulatory Visit (HOSPITAL_BASED_OUTPATIENT_CLINIC_OR_DEPARTMENT_OTHER): Payer: Self-pay

## 2023-05-31 ENCOUNTER — Ambulatory Visit
Admission: RE | Admit: 2023-05-31 | Discharge: 2023-05-31 | Disposition: A | Discharge status: Home / Self Care | Payer: Medicare Other | Source: Ambulatory Visit | Attending: Internal Medicine | Admitting: Internal Medicine

## 2023-05-31 ENCOUNTER — Other Ambulatory Visit: Payer: Self-pay | Admitting: Internal Medicine

## 2023-05-31 DIAGNOSIS — J40 Bronchitis, not specified as acute or chronic: Secondary | ICD-10-CM | POA: Diagnosis not present

## 2023-05-31 DIAGNOSIS — E559 Vitamin D deficiency, unspecified: Secondary | ICD-10-CM | POA: Diagnosis not present

## 2023-05-31 DIAGNOSIS — Z8616 Personal history of COVID-19: Secondary | ICD-10-CM | POA: Diagnosis not present

## 2023-05-31 DIAGNOSIS — R0602 Shortness of breath: Secondary | ICD-10-CM | POA: Diagnosis not present

## 2023-05-31 DIAGNOSIS — U071 COVID-19: Secondary | ICD-10-CM | POA: Diagnosis not present

## 2023-05-31 DIAGNOSIS — R059 Cough, unspecified: Secondary | ICD-10-CM | POA: Diagnosis not present

## 2023-05-31 DIAGNOSIS — D849 Immunodeficiency, unspecified: Secondary | ICD-10-CM | POA: Diagnosis not present

## 2023-06-14 ENCOUNTER — Ambulatory Visit (INDEPENDENT_AMBULATORY_CARE_PROVIDER_SITE_OTHER): Payer: Medicare Other | Admitting: Neurology

## 2023-06-14 ENCOUNTER — Encounter: Payer: Self-pay | Admitting: Neurology

## 2023-06-14 VITALS — BP 110/69 | HR 72 | Ht 66.0 in | Wt 154.0 lb

## 2023-06-14 DIAGNOSIS — Z8669 Personal history of other diseases of the nervous system and sense organs: Secondary | ICD-10-CM | POA: Diagnosis not present

## 2023-06-14 DIAGNOSIS — G4730 Sleep apnea, unspecified: Secondary | ICD-10-CM | POA: Diagnosis not present

## 2023-06-14 MED ORDER — PYRIDOSTIGMINE BROMIDE 60 MG PO TABS: 30.0000 mg | ORAL_TABLET | Freq: Three times a day (TID) | ORAL | 5 refills | Status: DC

## 2023-06-14 NOTE — Progress Notes (Signed)
Provider:  Melvyn Novas, MD  Primary Care Physician:  Merlene Laughter, MD (Inactive) [address] [place] [address]     Referring Provider: Stoneking, Hal, Md [address] [place],  [place] [address]          Chief Complaint according to patient   Patient presents with:     Seronegative  MG Patient (Initial Visit)           HISTORY OF PRESENT ILLNESS:  Felicia Cain is a 67 y.o. female patient who is here for revisit 06/14/2023 for MG follow up. . OSA on CPAP>  Chief concern according to patient :    Rm 1, here alone Pt is here for yearly follow up on myasthenia gravis. Pt states she is doing well overall. No new concerns.      06-08-2022: Uses new CPAP RESMED and felt the Respironics was friendlier to use.    Dr. Si Raider here for compliance with new PAP therapy: She has been highly compliant user of CPAP 28 out of 30 days with an average use at time of 6 hours 36 minutes her  RESMED AutoSet is set between 4 and 12 cmH2O with 3 cm expiratory pressure relief, the residual AHI is 2.4/h which is a good resolution.   The 95th percentile for this patient is 10.9 so it very close to 11 cmH2O pressure air leak is minimal 12 L/min and the residual apneas are equal amounts of obstructive and central. She d/c magnesium after she felt it affected her breathing strength. I reviewed her medications.   Epworth score is 7/ 24 points, FSS at 28/ 63 points.    Rv 07-23-2021, Dr. Si Raider  is now 67 years-old, retired. Her CPAP data were accessed through her phone, and he is compliant. AHI is under 5. Average pressure 9.8 L/min. She feels less sleepy. She lives with her mother , who turned, 63 and has begun to have dementia, her maternal uncle is 43. Fibromyalgia has been calm. Sero-negative myasthenia was always our working  diagnosis, but Dr Milagros Evener initiated a single fiber EMG and that returned negative . She reports a right handed  tremor,  dominant hand , no cogwheeling, but loss of grip strength. She made Dr Bess Harvest  aware- he did not think she has MG. She has seen improving symptoms after single fiber EMG- an effect of dry needles, and she felt 14 days much better- and no ptosis.  Dr Berline Chough , DO, sports medicine, orthopedist, he has been working on osteopathic adjustments.        Review of Systems: Out of a complete 14 system review, the patient complains of only the following symptoms, and all other reviewed systems are negative.:  Fatigue, sleepiness , snoring, Actinic keratosis.  High fall risk.     How likely are you to doze in the following situations: 0 = not likely, 1 = slight chance, 2 = moderate chance, 3 = high chance   Sitting and Reading? Watching Television? Sitting inactive in a public place (theater or meeting)? As a passenger in a car for an hour without a break? Lying down in the afternoon when circumstances permit? Sitting and talking to someone? Sitting quietly after lunch without alcohol? In a car, while stopped for a few minutes in traffic?   Total = 7/ 24 points   FSS endorsed at 40/ 63 points.   Social History  Socioeconomic History   Marital status: Widowed    Spouse name: Not on file   Number of children: Not on file   Years of education: Not on file   Highest education level: Not on file  Occupational History   Not on file  Tobacco Use   Smoking status: Never   Smokeless tobacco: Never  Vaping Use   Vaping status: Never Used  Substance and Sexual Activity   Alcohol use: No   Drug use: No   Sexual activity: Not Currently  Other Topics Concern   Not on file  Social History Narrative   Not on file   Social Determinants of Health   Financial Resource Strain: Not on file  Food Insecurity: Not on file  Transportation Needs: Not on file  Physical Activity: Not on file  Stress: Not on file  Social Connections: Unknown (02/23/2022)   Received from [facility], [facility]    Social Network    Social Network: Not on file    Family History  Problem Relation Age of Onset   Stroke Mother    Hypertension Mother    Early death Father    Cancer Father 75       sarcoma   Parkinson's disease Maternal Aunt    Cancer Maternal Uncle    Cancer Paternal Grandmother        breast    Past Medical History:  Diagnosis Date   Anxiety    Arthritis    "back" (01/25/2017)   Brachial neuritis    neuropathy   Chronic lower back pain    DCIS (ductal carcinoma in situ)    "left side?"   Esophageal stricture    stricture with dysphasia   Fibromyalgia    GERD (gastroesophageal reflux disease)    H/O Doppler ultrasound 2006   see scanned study   H/O echocardiogram 2004, 2013   Hepatitis A 1961   [place]"   History of cardiac monitoring 2006   History of nuclear stress test 2004   see scanned study   Migraine    "in the past; cycle related" (01/25/2017)   Myasthenia gravis in crisis (HCC) 04/2014   respiratory crisis   NAION (non-arteritic anterior ischemic optic neuropathy), right    Near syncope    Neuromuscular disorder (HCC)    Neuropathy    Optic neuritis    ischaemic optic neuritis, non arteric   Optic neuropathy    PONV (postoperative nausea and vomiting)    Posterior optic neuritis    Ptosis of eyelid    Sleep apnea    "I wear Moses device; I don't wear CPAP" (01/25/2017)   Spinal stenosis     Past Surgical History:  Procedure Laterality Date   AUGMENTATION MAMMAPLASTY     2000, after breast cancer surgery,redone 2010   BACK SURGERY     BREAST BIOPSY     BREAST CAPSULECTOMY WITH IMPLANT EXCHANGE Bilateral 09/28/2018   Procedure: REMOVAL OF BILATERAL BREAST IMPLANTS WITH CAPSULECTOMIES AND REPLACEMENTS OF IMPLANTS;  Surgeon: Peggye Form, DO;  Location: [facility];  Service: Plastics;  Laterality: Bilateral;   CARPAL TUNNEL RELEASE Bilateral    CESAREAN SECTION  1989   ESOPHAGOGASTRODUODENOSCOPY (EGD) WITH  ESOPHAGEAL DILATION     HIP PINNING,CANNULATED Right 01/26/2017   Procedure: CANNULATED SCREWS/RIGHT HIP PINNING;  Surgeon: Kathryne Hitch, MD;  Location: [facility] OR;  Service: Orthopedics;  Laterality: Right;   INNER EAR SURGERY Right 1995   LUMBAR LAMINECTOMY  Right 1999   Dr Venetia Maxon   MASTECTOMY Bilateral      Current Outpatient Medications on File Prior to Visit  Medication Sig Dispense Refill   aspirin 81 MG chewable tablet Chew 81 mg by mouth daily.      CALCIUM-VITAMIN D PO Take 750 mg by mouth in the morning and at bedtime.     cholecalciferol (VITAMIN D) 1000 UNITS tablet Take 5,000 Units by mouth daily.      clobetasol (OLUX) 0.05 % topical foam Apply 1 application externally once a day for 7 days then m-f only 50 g 2   clobetasol (TEMOVATE) 0.05 % external solution Apply 1 Application topically 3 (three) times a week. FOAM     cycloSPORINE, PF, (CEQUA) 0.09 % SOLN Place 1 drop into both eyes 2 times daily. 180 each 3   erythromycin ophthalmic ointment Place into both eyes nightly. 10.5 g 3   finasteride (PROSCAR) 5 MG tablet Take 1/2 tablet by mouth once daily 15 tablet 3   gabapentin (NEURONTIN) 300 MG capsule TAKE TWO CAPSULES BY MOUTH TWICE DAILY 360 capsule 1   hydrocortisone 2.5 % ointment Apply topically twice a day Monday through Friday only (Patient taking differently: Apply topically. Once a day) 60 g 0   modafinil (PROVIGIL) 100 MG tablet Take 1 tablet by mouth daily. 30 tablet 2   Multiple Vitamins-Minerals (PRESERVISION AREDS 2) CAPS Take 1 capsule by mouth in the morning and at bedtime.     pyridostigmine (MESTINON) 60 MG tablet Take 1/2 tablet (30 mg total) by mouth 3 times daily. (Patient taking differently: Take 30 mg by mouth 3 (three) times daily. Prn) 30 tablet 5   RSV vaccine recomb adjuvanted (AREXVY) 120 MCG/0.5ML injection Inject into the muscle. 0.5 mL 0   Thiamine HCl (VITAMIN B-1) 250 MG tablet Take 250 mg by mouth daily.     tretinoin (RETIN-A) 0.1 %  cream Apply to the face once daily in the evening 20 g 5   triamcinolone ointment (KENALOG) 0.1 % Apply topically to the feet 2 times a day Monday thru Friday as needed 100 g 0   COVID-19 mRNA vaccine 2023-2024 (COMIRNATY) syringe Inject into the muscle. (Patient not taking: Reported on 06/14/2023) 0.3 mL 0   No current facility-administered medications on file prior to visit.    Allergies  Allergen Reactions   Other Other (See Comments) and Rash    blisters  Blistering on site, can only use paper tape or surgical.   Tape Rash and Other (See Comments)     Blistering on site, can only use paper tape or surgical.   Adderall [Amphetamine-Dextroamphetamine] Other (See Comments)    headache   Latex Itching and Swelling     DIAGNOSTIC DATA (LABS, IMAGING, TESTING) - I reviewed patient records, labs, notes, testing and imaging myself where available.  Lab Results  Component Value Date   WBC 9.2 02/05/2017   HGB 10.3 (L) 02/05/2017   HCT 31.4 (L) 02/05/2017   MCV 90.5 02/05/2017   PLT 434 (H) 02/05/2017      Component Value Date/Time   NA 139 02/05/2017 1652   K 4.4 02/05/2017 1652   CL 104 02/05/2017 1652   CO2 26 02/05/2017 1652   GLUCOSE 98 02/05/2017 1652   BUN 22 (H) 02/05/2017 1652   CREATININE 0.50 02/20/2020 1608   CREATININE 0.58 09/25/2013 1024   CALCIUM 9.2 02/05/2017 1652   PROT 7.2 07/19/2015 0734   ALBUMIN 4.0 07/19/2015 0734  AST 35 07/19/2015 0734   ALT 27 07/19/2015 0734   ALKPHOS 62 07/19/2015 0734   BILITOT 0.8 07/19/2015 0734   GFRNONAA >60 02/05/2017 1652   GFRAA >60 02/05/2017 1652   Lab Results  Component Value Date   CHOL 188 11/01/2013   HDL 74 11/01/2013   LDLCALC 105 (H) 11/01/2013   TRIG 46 11/01/2013   CHOLHDL 2.5 11/01/2013   No results found for: "HGBA1C" No results found for: "VITAMINB12" Lab Results  Component Value Date   TSH 0.533 09/25/2013    Patient with diagnosed Myasthenia and excessive daytime sleepiness, recent  diagnosis of rheumatoid arthritis by X ray. On CPAP for OSA.12-13-2019; I  have the pleasure of seeing Dr. Si Raider today ,who is now followed by Dr. Florinda Marker as her primary care physician.  I have followed her for obstructive sleep apnea on CPAP she also has a history of seronegative myasthenia gravis.   Over the last year she had been evaluated and diagnosed with rheumatoid arthritis.  She had several procedures including a fall related hip fracture that could be repaired.  She still has a gait abnormality and she feels that her right leg is almost inverted inverted.  She is using a a flex CPAP machine from [employer], her PE average pressure is 9.5 cmH2O the mean pressure is 7.2 cmH2O she has been an 87% compliant user and there were only a few days under 4 hours of CPAP use.  She does have an average AHI of 7.1 and feels that the nasal pillow mask is again bothering her which also seems to be related to the air blowing into he eyes. I will offer her a bella swift nasal pillow. [employer].   PHYSICAL EXAM:  Today's Vitals   06/14/23 1421  BP: 110/69  Pulse: 72  Weight: 154 lb (69.9 kg)  Height: 5\' 6"  (1.676 m)   Body mass index is 24.86 kg/m.   Wt Readings from Last 3 Encounters:  06/14/23 154 lb (69.9 kg)  06/08/22 152 lb (68.9 kg)  08/05/21 150 lb (68 kg)     Ht Readings from Last 3 Encounters:  06/14/23 5\' 6"  (1.676 m)  06/08/22 5\' 6"  (1.676 m)  08/05/21 5\' 6"  (1.676 m)      Patient is awake, alert & oriented to person, place & time.  Head:  Normocephalic. Ears, Nose, Throat:  Mallampati 2, severe garde 3 retrognathia. Small bridge of the nose.  Neck: circumference 14 ". Respiratory:  Lungs clear throughout to auscultation.    Chronic cough - productive in AM . No wheezing.   Cardiovascular:  No carotid artery bruits. Heart is regular rate / rhythm / no murmurs.  Skin:  No bruising, no rash.  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  Neurologic Exam: Mental Status: appears  depressed, fatigued and is in general discomfort, pain- is well oriented, thought content appropriate.  Speech fluent without evidence of aphasia. Able to follow 3 step commands without difficulty.   Cranial Nerves: II-Disc is pale . Pupils are equal reactive to light , today without ptosis.  Visual field restricted in the lower outer quadrant of the right eye field .  Conjugate eye movements symmetric facial sensation is described as a feeling of weakness or heaviness, predominantly in the right face and a slightly lower nasal labial fold and from angle of the mouth on the right side .-normal gag reflex. -bilateral shoulder shrug is intact ,midline tongue extension- no fasciculation.    Motor:  Muscle tone showed no rigidity and no cogwheeling rigidity- but a right hand tremor at rest and with action, low amplitude. She feels she lost muscle mass in both calfs.  Normal ROM .   Weakness of hip adduction , not flexion and not abduction. 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  She walks slightly on the lateral edge of her fot, a bist of abasia may be present  Grip weakness.  Coordination ; finger to nose is slower but without tremor or dysmetria, ataxia.  Sensory: Pinprick and light touch intact throughout, bilaterally. Deep Tendon Reflexes: 1 plus  and symmetric throughout. Plantars: Downgoing bilaterally. Low amplitude tremor- right hand, but normal finger-to-nose. Test -  Testing Romberg was negative today.        ASSESSMENT AND PLAN 67 y.o. year old female  here with concerns about parkinson plus.   Just had contracted Covid on her way back from [place], [place]   She feels again more fatigued but continued to use CPAP- travel CPAP, rigid, stiff, clumsy and has trouble to sense her point of gravity. Reading is more difficult.  Titubation and right hand tremor at rest and with action,    Tremor was reported  years ago when  she worked at [employer].   She reports this with action.  Reports paroxysmal large amplitude( fast) movements, I have not witnessed yet. Overshooting movements make it hard to write.  I know of no diagnosis to explain thi, but we will observe her yearly    If she can tolerate Accupuncture and / or Accupressure-massage  I like for her to utilize it.  She felt better in term of  muscle tone, spasms, tremor and remarkably , she reported improvements  for ptosis.     1) Adduction at the hip is weaker than abduction. I like for her to continue warm water aquatherapy with resistance bands and pool noodles.   Fall prevention discussed.   2) I feel no cogwheeling and no rigor, but agree that there is limited proximal muscle strength and core strength. She feels everything takes a her much longer and makes her more tired.   3)refilled mestinon .  4) continue CPAP>    I plan to follow up either personally or through our NP within 12 months.   I would like to thank Merlene Laughter, MD (Inactive) and Stoneking, Hal, Md [address] [place],  [place] [address] for allowing me to meet with and to take care of this pleasant patient.   CC: I will share my notes with Dr. Pete Glatter   After spending a total time of  35  minutes face to face and additional time for physical and neurologic examination, review of laboratory studies,  personal review of imaging studies, reports and results of other testing and review of referral information / records as far as provided in visit,   Electronically signed by: Melvyn Novas, MD 06/14/2023 2:49 PM  [name] Board certified by The [employer] of Sleep Medicine and Diplomate of the [employer] of Sleep Medicine. Board certified In Neurology through the ABPN, Fellow of the [employer] of Neu[employer].

## 2023-06-16 DIAGNOSIS — R350 Frequency of micturition: Secondary | ICD-10-CM | POA: Diagnosis not present

## 2023-06-16 DIAGNOSIS — N3941 Urge incontinence: Secondary | ICD-10-CM | POA: Diagnosis not present

## 2023-06-22 ENCOUNTER — Other Ambulatory Visit (HOSPITAL_BASED_OUTPATIENT_CLINIC_OR_DEPARTMENT_OTHER): Payer: Self-pay

## 2023-06-22 DIAGNOSIS — L409 Psoriasis, unspecified: Secondary | ICD-10-CM | POA: Diagnosis not present

## 2023-06-22 DIAGNOSIS — K219 Gastro-esophageal reflux disease without esophagitis: Secondary | ICD-10-CM | POA: Diagnosis not present

## 2023-06-22 DIAGNOSIS — M7701 Medial epicondylitis, right elbow: Secondary | ICD-10-CM | POA: Diagnosis not present

## 2023-06-22 DIAGNOSIS — I7300 Raynaud's syndrome without gangrene: Secondary | ICD-10-CM | POA: Diagnosis not present

## 2023-06-22 DIAGNOSIS — Z79899 Other long term (current) drug therapy: Secondary | ICD-10-CM | POA: Diagnosis not present

## 2023-06-22 DIAGNOSIS — M1990 Unspecified osteoarthritis, unspecified site: Secondary | ICD-10-CM | POA: Diagnosis not present

## 2023-06-22 DIAGNOSIS — L4050 Arthropathic psoriasis, unspecified: Secondary | ICD-10-CM | POA: Diagnosis not present

## 2023-06-22 DIAGNOSIS — M791 Myalgia, unspecified site: Secondary | ICD-10-CM | POA: Diagnosis not present

## 2023-06-25 DIAGNOSIS — L309 Dermatitis, unspecified: Secondary | ICD-10-CM | POA: Diagnosis not present

## 2023-06-25 DIAGNOSIS — L03019 Cellulitis of unspecified finger: Secondary | ICD-10-CM | POA: Diagnosis not present

## 2023-06-30 DIAGNOSIS — R7989 Other specified abnormal findings of blood chemistry: Secondary | ICD-10-CM | POA: Diagnosis not present

## 2023-06-30 DIAGNOSIS — R946 Abnormal results of thyroid function studies: Secondary | ICD-10-CM | POA: Diagnosis not present

## 2023-07-01 ENCOUNTER — Other Ambulatory Visit (HOSPITAL_COMMUNITY): Payer: Self-pay | Admitting: Internal Medicine

## 2023-07-01 DIAGNOSIS — R7989 Other specified abnormal findings of blood chemistry: Secondary | ICD-10-CM

## 2023-07-01 DIAGNOSIS — R0602 Shortness of breath: Secondary | ICD-10-CM

## 2023-07-08 ENCOUNTER — Ambulatory Visit (HOSPITAL_BASED_OUTPATIENT_CLINIC_OR_DEPARTMENT_OTHER)
Admission: RE | Admit: 2023-07-08 | Discharge: 2023-07-08 | Disposition: A | Discharge status: Home / Self Care | Payer: Medicare Other | Source: Ambulatory Visit | Attending: Internal Medicine | Admitting: Internal Medicine

## 2023-07-08 DIAGNOSIS — H11822 Conjunctivochalasis, left eye: Secondary | ICD-10-CM | POA: Diagnosis not present

## 2023-07-08 DIAGNOSIS — R0602 Shortness of breath: Secondary | ICD-10-CM | POA: Insufficient documentation

## 2023-07-08 DIAGNOSIS — H0288A Meibomian gland dysfunction right eye, upper and lower eyelids: Secondary | ICD-10-CM | POA: Diagnosis not present

## 2023-07-08 DIAGNOSIS — R7989 Other specified abnormal findings of blood chemistry: Secondary | ICD-10-CM | POA: Diagnosis not present

## 2023-07-08 DIAGNOSIS — H40002 Preglaucoma, unspecified, left eye: Secondary | ICD-10-CM | POA: Diagnosis not present

## 2023-07-08 DIAGNOSIS — H47291 Other optic atrophy, right eye: Secondary | ICD-10-CM | POA: Diagnosis not present

## 2023-07-08 DIAGNOSIS — H0288B Meibomian gland dysfunction left eye, upper and lower eyelids: Secondary | ICD-10-CM | POA: Diagnosis not present

## 2023-07-08 MED ORDER — IOHEXOL 350 MG/ML SOLN
100.0000 mL | Freq: Once | INTRAVENOUS | Status: AC | PRN
  Administered 2023-07-08: 100 mL via INTRAVENOUS

## 2023-07-09 ENCOUNTER — Other Ambulatory Visit: Payer: Self-pay | Admitting: Medical Genetics

## 2023-07-09 ENCOUNTER — Inpatient Hospital Stay
Admission: RE | Admit: 2023-07-09 | Discharge: 2023-07-09 | Disposition: A | Discharge status: Home / Self Care | Payer: Self-pay | Source: Ambulatory Visit | Attending: Internal Medicine | Admitting: Internal Medicine

## 2023-07-09 ENCOUNTER — Other Ambulatory Visit (HOSPITAL_BASED_OUTPATIENT_CLINIC_OR_DEPARTMENT_OTHER): Payer: Self-pay

## 2023-07-09 DIAGNOSIS — R52 Pain, unspecified: Secondary | ICD-10-CM

## 2023-07-09 DIAGNOSIS — Z006 Encounter for examination for normal comparison and control in clinical research program: Secondary | ICD-10-CM

## 2023-07-23 ENCOUNTER — Other Ambulatory Visit: Payer: Self-pay | Admitting: Neurology

## 2023-07-23 DIAGNOSIS — R519 Headache, unspecified: Secondary | ICD-10-CM | POA: Diagnosis not present

## 2023-07-23 DIAGNOSIS — U099 Post covid-19 condition, unspecified: Secondary | ICD-10-CM | POA: Diagnosis not present

## 2023-07-23 DIAGNOSIS — M6281 Muscle weakness (generalized): Secondary | ICD-10-CM | POA: Diagnosis not present

## 2023-07-23 DIAGNOSIS — M25512 Pain in left shoulder: Secondary | ICD-10-CM | POA: Diagnosis not present

## 2023-07-23 DIAGNOSIS — M542 Cervicalgia: Secondary | ICD-10-CM | POA: Diagnosis not present

## 2023-07-23 DIAGNOSIS — M9908 Segmental and somatic dysfunction of rib cage: Secondary | ICD-10-CM | POA: Diagnosis not present

## 2023-07-23 DIAGNOSIS — M545 Low back pain, unspecified: Secondary | ICD-10-CM | POA: Diagnosis not present

## 2023-07-23 DIAGNOSIS — M9901 Segmental and somatic dysfunction of cervical region: Secondary | ICD-10-CM | POA: Diagnosis not present

## 2023-07-23 DIAGNOSIS — R0602 Shortness of breath: Secondary | ICD-10-CM | POA: Diagnosis not present

## 2023-07-23 DIAGNOSIS — M549 Dorsalgia, unspecified: Secondary | ICD-10-CM | POA: Diagnosis not present

## 2023-07-23 DIAGNOSIS — M9902 Segmental and somatic dysfunction of thoracic region: Secondary | ICD-10-CM | POA: Diagnosis not present

## 2023-07-29 DIAGNOSIS — G20A1 Parkinson's disease without dyskinesia, without mention of fluctuations: Secondary | ICD-10-CM | POA: Diagnosis not present

## 2023-08-01 NOTE — Progress Notes (Unsigned)
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 Covid 05/2023   Dyspnea:  treadmill  with lowest sats = 94%  Cough: dry daytime / no noct cough  Sleep: bed is flat/ 2 pillows  SABA use: pulmocort dpi prn  cough   No obvious day to day or daytime pattern/variability or assoc excess/ purulent sputum or mucus plugs or hemoptysis or cp or chest tightness, subjective wheeze or overt sinus or hb symptoms.    Also denies any obvious fluctuation of symptoms with weather or environmental changes or other aggravating or alleviating factors except as outlined above   No unusual exposure hx or h/o childhood pna/ asthma or knowledge of premature birth.  Current Allergies, Complete Past Medical History, Past Surgical History, Family History, and Social History were reviewed in [employer] record.  ROS  The following are not active complaints unless bolded Hoarseness, sore throat, dysphagia, dental problems, itching, sneezing,  nasal congestion or discharge of excess mucus or purulent secretions, ear ache,   fever, chills, sweats, unintended wt loss or wt gain, classically pleuritic or exertional cp,  orthopnea pnd or arm/hand swelling  or leg swelling, presyncope, palpitations, abdominal pain, anorexia, nausea, vomiting, diarrhea  or change in bowel habits or change in bladder habits, change in stools or change in urine, dysuria, hematuria,  rash, arthralgias, visual complaints, headache,  numbness, weakness or ataxia or problems with walking or coordination,  change in mood or  memory.             Outpatient Medications Prior to Visit  Medication Sig Dispense Refill   aspirin 81 MG chewable tablet Chew 81 mg by mouth daily.      CALCIUM-VITAMIN D PO Take 750 mg by mouth in the morning and at bedtime.     cholecalciferol (VITAMIN D) 1000 UNITS tablet Take 5,000 Units by mouth daily.      clobetasol (OLUX) 0.05 % topical foam Apply 1 application externally once a day for 7 days then m-f only 50 g 2   clobetasol (TEMOVATE) 0.05 % external solution Apply 1 Application topically 3 (three) times a week. FOAM     COVID-19 mRNA vaccine 2023-2024 (COMIRNATY) syringe Inject into the muscle. 0.3 mL 0   cycloSPORINE, PF, (CEQUA) 0.09 % SOLN Place 1 drop into both eyes 2 times daily. 180 each 3   erythromycin ophthalmic ointment Place into both eyes nightly. 10.5 g 3   finasteride (PROSCAR) 5 MG tablet Take 1/2 tablet by mouth once daily 15 tablet 3   gabapentin (NEURONTIN) 300 MG capsule TAKE TWO CAPSULES BY MOUTH TWICE DAILY 360 capsule 1   hydrocortisone 2.5 % ointment Apply topically twice a day Monday through Friday only (Patient taking differently: Apply topically. Once a day) 60 g 0   modafinil (PROVIGIL) 100 MG tablet Take 1 tablet by mouth daily. 30 tablet 2   Multiple  Vitamins-Minerals (PRESERVISION AREDS 2) CAPS Take 1 capsule by mouth in the morning and at bedtime.     pyridostigmine (MESTINON) 60 MG tablet Take 0.5 tablets (30 mg total) by mouth 3 (three) times daily. 90 tablet 5   RSV vaccine recomb adjuvanted (AREXVY) 120 MCG/0.5ML injection Inject into the muscle. 0.5 mL 0   Thiamine HCl (VITAMIN B-1) 250 MG tablet Take 250 mg by mouth daily.     tretinoin (RETIN-A) 0.1 % cream APPLY TO FACE ONCE A DAY IN THE EVENING 20 g 5   triamcinolone ointment (KENALOG) 0.1 % Apply topically to the feet 2 times a day Monday thru Friday as needed 100 g 0   No facility-administered  medications prior to visit.    Past Medical History:  Diagnosis Date   Anxiety    Arthritis    "back" (01/25/2017)   Brachial neuritis    neuropathy   Chronic lower back pain    DCIS (ductal carcinoma in situ)    "left side?"   Esophageal stricture    stricture with dysphasia   Fibromyalgia    GERD (gastroesophageal reflux disease)    H/O Doppler ultrasound 2006   see scanned study   H/O echocardiogram 2004, 2013   Hepatitis A 1961   [place]"   History of cardiac monitoring 2006   History of nuclear stress test 2004   see scanned study   Migraine    "in the past; cycle related" (01/25/2017)   Myasthenia gravis in crisis (HCC) 04/2014   respiratory crisis   NAION (non-arteritic anterior ischemic optic neuropathy), right    Near syncope    Neuromuscular disorder (HCC)    Neuropathy    Optic neuritis    ischaemic optic neuritis, non arteric   Optic neuropathy    PONV (postoperative nausea and vomiting)    Posterior optic neuritis    Ptosis of eyelid    Sleep apnea    "I wear Moses device; I don't wear CPAP" (01/25/2017)   Spinal stenosis       Objective:     BP 112/77 (BP Location: Left Arm, Cuff Size: Normal)   Pulse 80   Temp 97.8 F (36.6 C) (Oral)   Ht 5\' 6"  (1.676 m)   Wt 155 lb 12.8 oz (70.7 kg)   SpO2 98%   BMI 25.15 kg/m   SpO2: 98 % RA  Amb wf nad    HEENT : Oropharynx  clear      NECK :  without  apparent JVD/ palpable Nodes/TM    LUNGS: no acc muscle use,  Nl contour chest which is clear to A and P bilaterally with with occ dry coughing with deep breath   CV:  RRR  no s3 or murmur or increase in P2, and no edema   ABD:  soft and nontender with nl inspiratory excursion in the supine position. No bruits or organomegaly appreciated   MS:  Nl gait/ ext warm without deformities Or obvious joint restrictions  calf tenderness, cyanosis or clubbing    SKIN: warm and dry without lesions    NEURO:  alert, approp, nl sensorium with   no motor or cerebellar deficits apparent.     I personally reviewed images and agree with radiology impression as follows:   Chest CTa    07/08/23 1. No evidence of significant pulmonary embolus. 2. No evidence of active pulmonary disease.       Assessment   No problem-specific Assessment &  Plan notes found for this encounter.     Sandrea Hughs, MD 08/03/2023

## 2023-08-03 ENCOUNTER — Encounter: Payer: Self-pay | Admitting: Internal Medicine

## 2023-08-03 ENCOUNTER — Ambulatory Visit: Payer: Medicare Other | Admitting: Internal Medicine

## 2023-08-03 VITALS — BP 112/77 | HR 80 | Temp 97.8°F | Ht 66.0 in | Wt 155.8 lb

## 2023-08-03 DIAGNOSIS — R053 Chronic cough: Secondary | ICD-10-CM | POA: Insufficient documentation

## 2023-08-03 NOTE — Assessment & Plan Note (Signed)
Never smoker - s/p covid mid sept 2024 with nl CTa chest  07/08/23   Reassured not unusual to have mild residual cough and sob p covid and would not rec additional f/u but rec stay active and use otc for cough and acid suppression to prevent a cyclical mechanism and return here prn if not back to baseline p xmas holidays, sooner if needed           Each maintenance medication was reviewed in detail including emphasizing most importantly the difference between maintenance and prns and under what circumstances the prns are to be triggered using an action plan format where appropriate.  Total time for H and P, chart review, counseling, and generating customized AVS unique to this office visit / same day charting = 30 min new pt eval

## 2023-08-03 NOTE — Patient Instructions (Signed)
Pulmonary follow up is as needed

## 2023-08-04 DIAGNOSIS — M810 Age-related osteoporosis without current pathological fracture: Secondary | ICD-10-CM | POA: Diagnosis not present

## 2023-08-17 DIAGNOSIS — M9903 Segmental and somatic dysfunction of lumbar region: Secondary | ICD-10-CM | POA: Diagnosis not present

## 2023-08-17 DIAGNOSIS — M9901 Segmental and somatic dysfunction of cervical region: Secondary | ICD-10-CM | POA: Diagnosis not present

## 2023-08-17 DIAGNOSIS — R519 Headache, unspecified: Secondary | ICD-10-CM | POA: Diagnosis not present

## 2023-08-17 DIAGNOSIS — G20B2 Parkinson's disease with dyskinesia, with fluctuations: Secondary | ICD-10-CM | POA: Diagnosis not present

## 2023-08-17 DIAGNOSIS — M9904 Segmental and somatic dysfunction of sacral region: Secondary | ICD-10-CM | POA: Diagnosis not present

## 2023-08-17 DIAGNOSIS — M9906 Segmental and somatic dysfunction of lower extremity: Secondary | ICD-10-CM | POA: Diagnosis not present

## 2023-08-17 DIAGNOSIS — M9905 Segmental and somatic dysfunction of pelvic region: Secondary | ICD-10-CM | POA: Diagnosis not present

## 2023-08-17 DIAGNOSIS — M542 Cervicalgia: Secondary | ICD-10-CM | POA: Diagnosis not present

## 2023-08-17 DIAGNOSIS — M9902 Segmental and somatic dysfunction of thoracic region: Secondary | ICD-10-CM | POA: Diagnosis not present

## 2023-08-17 DIAGNOSIS — M79662 Pain in left lower leg: Secondary | ICD-10-CM | POA: Diagnosis not present

## 2023-08-17 DIAGNOSIS — M79652 Pain in left thigh: Secondary | ICD-10-CM | POA: Diagnosis not present

## 2023-08-18 DIAGNOSIS — N952 Postmenopausal atrophic vaginitis: Secondary | ICD-10-CM | POA: Diagnosis not present

## 2023-08-18 DIAGNOSIS — N393 Stress incontinence (female) (male): Secondary | ICD-10-CM | POA: Diagnosis not present

## 2023-08-18 DIAGNOSIS — N3281 Overactive bladder: Secondary | ICD-10-CM | POA: Diagnosis not present

## 2023-08-18 DIAGNOSIS — G20A1 Parkinson's disease without dyskinesia, without mention of fluctuations: Secondary | ICD-10-CM | POA: Diagnosis not present

## 2023-08-18 DIAGNOSIS — M810 Age-related osteoporosis without current pathological fracture: Secondary | ICD-10-CM | POA: Diagnosis not present

## 2023-08-19 DIAGNOSIS — K648 Other hemorrhoids: Secondary | ICD-10-CM | POA: Diagnosis not present

## 2023-08-19 DIAGNOSIS — K5730 Diverticulosis of large intestine without perforation or abscess without bleeding: Secondary | ICD-10-CM | POA: Diagnosis not present

## 2023-08-19 DIAGNOSIS — K219 Gastro-esophageal reflux disease without esophagitis: Secondary | ICD-10-CM | POA: Diagnosis not present

## 2023-08-19 DIAGNOSIS — R0789 Other chest pain: Secondary | ICD-10-CM | POA: Diagnosis not present

## 2023-08-25 DIAGNOSIS — M654 Radial styloid tenosynovitis [de Quervain]: Secondary | ICD-10-CM | POA: Diagnosis not present

## 2023-10-05 ENCOUNTER — Other Ambulatory Visit: Payer: Self-pay | Admitting: Sports Medicine

## 2023-10-05 ENCOUNTER — Ambulatory Visit
Admission: RE | Admit: 2023-10-05 | Discharge: 2023-10-05 | Disposition: A | Discharge status: Home / Self Care | Payer: Medicare Other | Source: Ambulatory Visit | Attending: Sports Medicine | Admitting: Sports Medicine

## 2023-10-05 DIAGNOSIS — G8929 Other chronic pain: Secondary | ICD-10-CM

## 2023-11-24 ENCOUNTER — Other Ambulatory Visit: Payer: Self-pay | Admitting: Orthopedic Surgery

## 2023-11-29 ENCOUNTER — Encounter (HOSPITAL_BASED_OUTPATIENT_CLINIC_OR_DEPARTMENT_OTHER): Payer: Self-pay | Admitting: Orthopedic Surgery

## 2023-11-30 ENCOUNTER — Other Ambulatory Visit: Payer: Self-pay | Admitting: Neurology

## 2023-12-02 ENCOUNTER — Other Ambulatory Visit: Payer: Self-pay

## 2023-12-02 ENCOUNTER — Encounter (HOSPITAL_BASED_OUTPATIENT_CLINIC_OR_DEPARTMENT_OTHER)
Admission: RE | Disposition: A | Discharge status: Home / Self Care | Payer: Self-pay | Source: Home / Self Care | Attending: Orthopedic Surgery

## 2023-12-02 ENCOUNTER — Ambulatory Visit (HOSPITAL_BASED_OUTPATIENT_CLINIC_OR_DEPARTMENT_OTHER): Payer: Self-pay | Admitting: Anesthesiology

## 2023-12-02 ENCOUNTER — Encounter (HOSPITAL_BASED_OUTPATIENT_CLINIC_OR_DEPARTMENT_OTHER): Payer: Self-pay | Admitting: Orthopedic Surgery

## 2023-12-02 ENCOUNTER — Ambulatory Visit (HOSPITAL_BASED_OUTPATIENT_CLINIC_OR_DEPARTMENT_OTHER)
Admission: RE | Admit: 2023-12-02 | Discharge: 2023-12-02 | Disposition: A | Discharge status: Home / Self Care | Payer: Self-pay | Attending: Orthopedic Surgery | Admitting: Orthopedic Surgery

## 2023-12-02 DIAGNOSIS — M654 Radial styloid tenosynovitis [de Quervain]: Secondary | ICD-10-CM | POA: Insufficient documentation

## 2023-12-02 DIAGNOSIS — G7000 Myasthenia gravis without (acute) exacerbation: Secondary | ICD-10-CM | POA: Insufficient documentation

## 2023-12-02 DIAGNOSIS — G4730 Sleep apnea, unspecified: Secondary | ICD-10-CM | POA: Diagnosis not present

## 2023-12-02 DIAGNOSIS — Z01818 Encounter for other preprocedural examination: Secondary | ICD-10-CM

## 2023-12-02 HISTORY — PX: DORSAL COMPARTMENT RELEASE: SHX5039

## 2023-12-02 HISTORY — DX: Myasthenia gravis without (acute) exacerbation: G70.00

## 2023-12-02 SURGERY — RELEASE, FIRST DORSAL COMPARTMENT, HAND
Anesthesia: General | Site: Hand | Laterality: Right

## 2023-12-02 MED ORDER — DEXAMETHASONE SODIUM PHOSPHATE 10 MG/ML IJ SOLN
INTRAMUSCULAR | Status: DC | PRN
  Administered 2023-12-02: 10 mg via INTRAVENOUS

## 2023-12-02 MED ORDER — PROPOFOL 10 MG/ML IV BOLUS
INTRAVENOUS | Status: DC | PRN
  Administered 2023-12-02: 150 mg via INTRAVENOUS
  Administered 2023-12-02: 175 ug/kg/min via INTRAVENOUS

## 2023-12-02 MED ORDER — BUPIVACAINE HCL (PF) 0.25 % IJ SOLN
INTRAMUSCULAR | Status: DC | PRN
  Administered 2023-12-02: 9 mL

## 2023-12-02 MED ORDER — PHENYLEPHRINE 80 MCG/ML (10ML) SYRINGE FOR IV PUSH (FOR BLOOD PRESSURE SUPPORT)
PREFILLED_SYRINGE | INTRAVENOUS | Status: AC
  Filled 2023-12-02: qty 10

## 2023-12-02 MED ORDER — SUCCINYLCHOLINE CHLORIDE 200 MG/10ML IV SOSY
PREFILLED_SYRINGE | INTRAVENOUS | Status: AC
  Filled 2023-12-02: qty 20

## 2023-12-02 MED ORDER — LIDOCAINE 2% (20 MG/ML) 5 ML SYRINGE
INTRAMUSCULAR | Status: DC | PRN
  Administered 2023-12-02: 60 mg via INTRAVENOUS

## 2023-12-02 MED ORDER — OXYCODONE HCL 5 MG PO TABS: 5.0000 mg | ORAL_TABLET | Freq: Once | ORAL | Status: DC | PRN

## 2023-12-02 MED ORDER — LACTATED RINGERS IV SOLN: INTRAVENOUS | Status: DC

## 2023-12-02 MED ORDER — FENTANYL CITRATE (PF) 100 MCG/2ML IJ SOLN
INTRAMUSCULAR | Status: DC | PRN
  Administered 2023-12-02: 50 ug via INTRAVENOUS

## 2023-12-02 MED ORDER — LIDOCAINE 2% (20 MG/ML) 5 ML SYRINGE
INTRAMUSCULAR | Status: AC
  Filled 2023-12-02: qty 5

## 2023-12-02 MED ORDER — OXYCODONE-ACETAMINOPHEN 5-325 MG PO TABS: 1.0000 | ORAL_TABLET | Freq: Four times a day (QID) | ORAL | 0 refills | Status: AC | PRN

## 2023-12-02 MED ORDER — 0.9 % SODIUM CHLORIDE (POUR BTL) OPTIME
TOPICAL | Status: DC | PRN
  Administered 2023-12-02: 200 mL

## 2023-12-02 MED ORDER — ONDANSETRON HCL 4 MG/2ML IJ SOLN
INTRAMUSCULAR | Status: DC | PRN
  Administered 2023-12-02: 4 mg via INTRAVENOUS

## 2023-12-02 MED ORDER — ONDANSETRON HCL 4 MG/2ML IJ SOLN
INTRAMUSCULAR | Status: AC
  Filled 2023-12-02: qty 8

## 2023-12-02 MED ORDER — OXYCODONE HCL 5 MG/5ML PO SOLN: 5.0000 mg | Freq: Once | ORAL | Status: DC | PRN

## 2023-12-02 MED ORDER — ACETAMINOPHEN 500 MG PO TABS
1000.0000 mg | ORAL_TABLET | Freq: Once | ORAL | Status: AC
  Administered 2023-12-02: 1000 mg via ORAL

## 2023-12-02 MED ORDER — EPHEDRINE 5 MG/ML INJ
INTRAVENOUS | Status: AC
  Filled 2023-12-02: qty 5

## 2023-12-02 MED ORDER — AMISULPRIDE (ANTIEMETIC) 5 MG/2ML IV SOLN: 10.0000 mg | Freq: Once | INTRAVENOUS | Status: DC | PRN

## 2023-12-02 MED ORDER — FENTANYL CITRATE (PF) 100 MCG/2ML IJ SOLN: 25.0000 ug | INTRAMUSCULAR | Status: DC | PRN

## 2023-12-02 MED ORDER — PROPOFOL 500 MG/50ML IV EMUL
INTRAVENOUS | Status: AC
  Filled 2023-12-02: qty 50

## 2023-12-02 MED ORDER — CEFAZOLIN SODIUM-DEXTROSE 2-4 GM/100ML-% IV SOLN
2.0000 g | INTRAVENOUS | Status: AC
Start: 2023-12-02 — End: 2023-12-02
  Administered 2023-12-02: 2 g via INTRAVENOUS

## 2023-12-02 MED ORDER — CEFAZOLIN SODIUM-DEXTROSE 2-4 GM/100ML-% IV SOLN
INTRAVENOUS | Status: AC
  Filled 2023-12-02: qty 100

## 2023-12-02 MED ORDER — FENTANYL CITRATE (PF) 100 MCG/2ML IJ SOLN
INTRAMUSCULAR | Status: AC
  Filled 2023-12-02: qty 2

## 2023-12-02 MED ORDER — DEXMEDETOMIDINE HCL IN NACL 80 MCG/20ML IV SOLN
INTRAVENOUS | Status: AC
  Filled 2023-12-02: qty 40

## 2023-12-02 MED ORDER — DEXAMETHASONE SODIUM PHOSPHATE 10 MG/ML IJ SOLN
INTRAMUSCULAR | Status: AC
  Filled 2023-12-02: qty 2

## 2023-12-02 MED ORDER — ACETAMINOPHEN 500 MG PO TABS
ORAL_TABLET | ORAL | Status: AC
  Filled 2023-12-02: qty 2

## 2023-12-02 MED ORDER — SODIUM CHLORIDE (PF) 0.9 % IJ SOLN
INTRAMUSCULAR | Status: AC
Start: 2023-12-02 — End: ?
  Filled 2023-12-02: qty 10

## 2023-12-02 SURGICAL SUPPLY — 32 items
BLADE MINI RND TIP GREEN BEAV (BLADE) IMPLANT
BLADE SURG 15 STRL LF DISP TIS (BLADE) ×2 IMPLANT
BNDG ELASTIC 3INX 5YD STR LF (GAUZE/BANDAGES/DRESSINGS) IMPLANT
BNDG ESMARK 4X9 LF (GAUZE/BANDAGES/DRESSINGS) IMPLANT
BNDG GAUZE DERMACEA FLUFF 4 (GAUZE/BANDAGES/DRESSINGS) ×1 IMPLANT
CHLORAPREP W/TINT 26 (MISCELLANEOUS) ×1 IMPLANT
CORD BIPOLAR FORCEPS 12FT (ELECTRODE) ×1 IMPLANT
COVER BACK TABLE 60X90IN (DRAPES) ×1 IMPLANT
COVER MAYO STAND STRL (DRAPES) ×1 IMPLANT
CUFF TOURN SGL QUICK 18X4 (TOURNIQUET CUFF) ×1 IMPLANT
DRAPE EXTREMITY T 121X128X90 (DISPOSABLE) ×1 IMPLANT
DRAPE SURG 17X23 STRL (DRAPES) ×1 IMPLANT
GAUZE PAD ABD 8X10 STRL (GAUZE/BANDAGES/DRESSINGS) ×1 IMPLANT
GAUZE SPONGE 4X4 12PLY STRL (GAUZE/BANDAGES/DRESSINGS) ×1 IMPLANT
GAUZE XEROFORM 1X8 LF (GAUZE/BANDAGES/DRESSINGS) ×1 IMPLANT
GLOVE BIO SURGEON STRL SZ7.5 (GLOVE) ×1 IMPLANT
GLOVE BIOGEL PI IND STRL 7.0 (GLOVE) IMPLANT
GLOVE BIOGEL PI IND STRL 8 (GLOVE) ×1 IMPLANT
GLOVE SURG SS PI 7.5 STRL IVOR (GLOVE) IMPLANT
GOWN STRL REUS W/ TWL LRG LVL3 (GOWN DISPOSABLE) ×1 IMPLANT
GOWN STRL REUS W/TWL XL LVL3 (GOWN DISPOSABLE) ×1 IMPLANT
NDL HYPO 25X1 1.5 SAFETY (NEEDLE) ×1 IMPLANT
NEEDLE HYPO 25X1 1.5 SAFETY (NEEDLE) ×1 IMPLANT
NS IRRIG 1000ML POUR BTL (IV SOLUTION) ×1 IMPLANT
PACK BASIN DAY SURGERY FS (CUSTOM PROCEDURE TRAY) ×1 IMPLANT
PADDING CAST ABS COTTON 4X4 ST (CAST SUPPLIES) ×1 IMPLANT
STOCKINETTE 4X48 STRL (DRAPES) ×1 IMPLANT
SUT ETHILON 4 0 PS 2 18 (SUTURE) ×1 IMPLANT
SYR BULB EAR ULCER 3OZ GRN STR (SYRINGE) ×1 IMPLANT
SYR CONTROL 10ML LL (SYRINGE) ×1 IMPLANT
TOWEL GREEN STERILE FF (TOWEL DISPOSABLE) ×1 IMPLANT
UNDERPAD 30X36 HEAVY ABSORB (UNDERPADS AND DIAPERS) ×1 IMPLANT

## 2023-12-02 NOTE — Discharge Instructions (Addendum)
[facility] Instructions Hand Surgery  Wound Care: Keep your hand elevated above the level of your heart.  Do not allow it to dangle by your side.  Keep the dressing dry and do not remove it unless your doctor advises you to do so.  He will usually change it at the time of your post-op visit.  Moving your fingers is advised to stimulate circulation but will depend on the site of your surgery.  If you have a splint applied, your doctor will advise you regarding movement.  Activity: Do not drive or operate machinery today.  Rest today and then you may return to your normal activity and work as indicated by your physician.  Diet:  Drink liquids today or eat a light diet.  You may resume a regular diet tomorrow.    General expectations: Pain for two to three days. Fingers may become slightly swollen.  Call your doctor if any of the following occur: Severe pain not relieved by pain medication. Elevated temperature. Dressing soaked with blood. Inability to move fingers. White or bluish color to fingers.  May take Tylenol after 6:20pm, if needed.    Post Anesthesia Home Care Instructions  Activity: Get plenty of rest for the remainder of the day. A responsible individual must stay with you for 24 hours following the procedure.  For the next 24 hours, DO NOT: -Drive a car -Advertising copywriter -Drink alcoholic beverages -Take any medication unless instructed by your physician -Make any legal decisions or sign important papers.  Meals: Start with liquid foods such as gelatin or soup. Progress to regular foods as tolerated. Avoid greasy, spicy, heavy foods. If nausea and/or vomiting occur, drink only clear liquids until the nausea and/or vomiting subsides. Call your physician if vomiting continues.  Special Instructions/Symptoms: Your throat may feel dry or sore from the anesthesia or the breathing tube placed in your throat during surgery. If this causes discomfort, gargle with warm salt  water. The discomfort should disappear within 24 hours.  If you had a scopolamine patch placed behind your ear for the management of post- operative nausea and/or vomiting:  1. The medication in the patch is effective for 72 hours, after which it should be removed.  Wrap patch in a tissue and discard in the trash. Wash hands thoroughly with soap and water. 2. You may remove the patch earlier than 72 hours if you experience unpleasant side effects which may include dry mouth, dizziness or visual disturbances. 3. Avoid touching the patch. Wash your hands with soap and water after contact with the patch.

## 2023-12-02 NOTE — Op Note (Signed)
 NAME: PIETRA ZULUAGA MEDICAL RECORD NO: [id number] DATE OF BIRTH: 1956/02/23 FACILITY: [facility] LOCATION: [facility] PHYSICIAN: Tami Ribas, MD   OPERATIVE REPORT   DATE OF PROCEDURE: 12/02/23    PREOPERATIVE DIAGNOSIS: Right de Quervain's tenosynovitis   POSTOPERATIVE DIAGNOSIS: Right de Quervain's tenosynovitis   PROCEDURE: Right first dorsal compartment release   SURGEON:  Betha Loa, M.D.   ASSISTANT: none   ANESTHESIA:   Total IV anesthesia   INTRAVENOUS FLUIDS:  Per anesthesia flow sheet.   ESTIMATED BLOOD LOSS:  Minimal.   COMPLICATIONS:  None.   SPECIMENS:  none   TOURNIQUET TIME:    Total Tourniquet Time Documented: Upper Arm (Right) - 10 minutes Total: Upper Arm (Right) - 10 minutes    DISPOSITION:  Stable to PACU.   INDICATIONS: 68 year old female with right de Quervain's tenosynovitis.  She has tried injections without lasting relief.  She wishes to have right first dorsal compartment release.  Risks, benefits and alternatives of surgery were discussed including the risks of blood loss, infection, damage to nerves, vessels, tendons, ligaments, bone for surgery, need for additional surgery, complications with wound healing, continued pain, stiffness, , recurrence.  She voiced understanding of these risks and elected to proceed.  OPERATIVE COURSE:  After being identified preoperatively by myself,  the patient and I agreed on the procedure and site of the procedure.  The surgical site was marked.  Surgical consent had been signed. Preoperative IV antibiotic prophylaxis was given. She was transferred to the operating room and placed on the operating table in supine position with the Right upper extremity on an arm board.  Total IV anesthesia was induced by the anesthesiologist.  Right upper extremity was prepped and draped in normal sterile orthopedic fashion.  A surgical pause was performed between the surgeons, anesthesia, and operating room  staff and all were in agreement as to the patient, procedure, and site of procedure.  Tourniquet at the proximal aspect of the extremity was inflated to 250 mmHg after exsanguination of the arm with an Esmarch bandage.  Incision was made over the first dorsal compartment at the radial side of the wrist.  This was carried in subcutaneous tissues by spreading technique.  Branches of the superficial branch of radial nerve were identified and protected throughout the case.  The APL tendon was identified.  The first dorsal compartment was sharply incised from distal to proximal under direct visualization.  There was inflammatory fluid within the compartment.  There was a separate compartment for the EPB tendon.  This was released as well.  The septum between the compartments was removed.  The area was visualized with septations were noted.  The wound was copiously irrigated with sterile saline closed with 4-0 nylon in a horizontal mattress fashion.  The wound was injected with quarter percent plain Marcaine to aid in postoperative analgesia.  It was dressed with sterile Xeroform 4 x 4's and an ABD used as a splint.  This was wrapped with Kerlix and Ace bandage.  The tourniquet was deflated at 10 minutes.  Fingertips were pink with brisk capillary refill after deflation of tourniquet.  The operative  drapes were broken down.  The patient was awoken from anesthesia safely.  She was transferred back to the stretcher and taken to PACU in stable condition.  I will see her back in the office in 1 week for postoperative followup.  I will give her a prescription for Percocet 5/325 1 tab PO q6 hours prn  pain, dispense # 10.   Betha Loa, MD Electronically signed, 12/02/23

## 2023-12-02 NOTE — Transfer of Care (Signed)
 Immediate Anesthesia Transfer of Care Note  Patient: Felicia Cain  Procedure(s) Performed: RELEASE, FIRST DORSAL COMPARTMENT, HAND (Right: Hand)  Patient Location: PACU  Anesthesia Type:General  Level of Consciousness: awake and patient cooperative  Airway & Oxygen Therapy: Patient Spontanous Breathing and Patient connected to face mask oxygen  Post-op Assessment: Report given to RN and Post -op Vital signs reviewed and stable  Post vital signs: Reviewed and stable  Last Vitals:  Vitals Value Taken Time  BP 114/68 12/02/23 1615  Temp    Pulse 73 12/02/23 1623  Resp 16 12/02/23 1623  SpO2 96 % 12/02/23 1623  Vitals shown include unfiled device data.  Last Pain:  Vitals:   12/02/23 1615  TempSrc:   PainSc: Asleep         Complications: No notable events documented.

## 2023-12-02 NOTE — Anesthesia Preprocedure Evaluation (Addendum)
 Anesthesia Evaluation  Patient identified by MRN, date of birth, ID band Patient awake    Reviewed: Allergy & Precautions, NPO status , Patient's Chart, lab work & pertinent test results  History of Anesthesia Complications (+) PONV and history of anesthetic complications  Airway Mallampati: I  TM Distance: >3 FB Neck ROM: Full    Dental no notable dental hx. 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 (once or twice a month), symptoms include ptosis and blurry vision Parkinsons negative neurological ROS     GI/Hepatic ,GERD  ,,(+) Hepatitis -, A  Endo/Other  negative endocrine ROS    Renal/GU negative Renal ROS  negative genitourinary   Musculoskeletal  (+) Arthritis ,  Fibromyalgia -  Abdominal   Peds  Hematology negative hematology ROS (+)   Anesthesia Other Findings   Reproductive/Obstetrics                             Anesthesia Physical Anesthesia Plan  ASA: 2  Anesthesia Plan: General   Post-op Pain Management: Tylenol PO (pre-op)*   Induction: Intravenous  PONV Risk Score and Plan: 4 or greater and Ondansetron, Dexamethasone and TIVA  Airway Management Planned: LMA  Additional Equipment:   Intra-op Plan:   Post-operative Plan: Extubation in OR  Informed Consent: I have reviewed the patients History and Physical, chart, labs and discussed the procedure including the risks, benefits and alternatives for the proposed anesthesia with the patient or authorized representative who has indicated his/her understanding and acceptance.     Dental advisory  given  Plan Discussed with: CRNA  Anesthesia Plan Comments:        Anesthesia Quick Evaluation

## 2023-12-02 NOTE — H&P (Signed)
 Felicia Cain is an 68 y.o. female.   Chief Complaint: dequervains HPI: 68 yo female with right wrist dequervains tenosynovitis.  She has tried non operative treatment measures without lasting relief.  She wishes to proceed with surgical first dorsal compartment release.  Allergies:  Allergies  Allergen Reactions   Other Other (See Comments) and Rash    blisters  Blistering on site, can only use paper tape or surgical.   Tape Rash and Other (See Comments)     Blistering on site, can only use paper tape or surgical.   Adderall [Amphetamine-Dextroamphetamine] Other (See Comments)    headache   Latex Itching and Swelling    Past Medical History:  Diagnosis Date   Anxiety    Arthritis    "back" (01/25/2017)   Brachial neuritis    neuropathy   Chronic lower back pain    DCIS (ductal carcinoma in situ)    "left side?"   Esophageal stricture    stricture with dysphasia   GERD (gastroesophageal reflux disease)    H/O Doppler ultrasound 2006   see scanned study   H/O echocardiogram 2004, 2013   Hepatitis A 1961   [place]epidemic [place] city"   History of cardiac monitoring 2006   History of nuclear stress test 2004   see scanned study   Myasthenia gravis (HCC)    occular   Myasthenia gravis in crisis (HCC) 04/2014   respiratory crisis   NAION (non-arteritic anterior ischemic optic neuropathy), right    Near syncope    Neuromuscular disorder (HCC)    Neuropathy    Optic neuritis    ischaemic optic neuritis, non arteric   Optic neuropathy    PONV (postoperative nausea and vomiting)    Posterior optic neuritis    Ptosis of eyelid    Sleep apnea    "I wear Moses device; I don't wear CPAP" (01/25/2017)   Spinal stenosis     Past Surgical History:  Procedure Laterality Date   AUGMENTATION MAMMAPLASTY     2000, after breast cancer surgery,redone 2010   BREAST BIOPSY     BREAST CAPSULECTOMY WITH IMPLANT EXCHANGE Bilateral 09/28/2018   Procedure: REMOVAL OF BILATERAL BREAST  IMPLANTS WITH CAPSULECTOMIES AND REPLACEMENTS OF IMPLANTS;  Surgeon: Peggye Form, DO;  Location: [facility];  Service: Plastics;  Laterality: Bilateral;   CARPAL TUNNEL RELEASE Bilateral    CESAREAN SECTION  1989   ESOPHAGOGASTRODUODENOSCOPY (EGD) WITH ESOPHAGEAL DILATION     HIP PINNING,CANNULATED Right 01/26/2017   Procedure: CANNULATED SCREWS/RIGHT HIP PINNING;  Surgeon: Kathryne Hitch, MD;  Location: [facility] OR;  Service: Orthopedics;  Laterality: Right;   INNER EAR SURGERY Right 1995   LUMBAR LAMINECTOMY Right 2000   Dr Venetia Maxon   MASTECTOMY Bilateral     Family History: Family History  Problem Relation Age of Onset   Stroke Mother    Hypertension Mother    Early death Father    Cancer Father 47       sarcoma   Parkinson's disease Maternal Aunt    Cancer Maternal Uncle    Cancer Paternal Grandmother        breast    Social History:   reports that she has never smoked. She has never used smokeless tobacco. She reports that she does not drink alcohol and does not use drugs.  Medications: Medications Prior to Admission  Medication Sig Dispense Refill   aspirin 81 MG chewable tablet Chew 81 mg by mouth daily.  b complex vitamins capsule Take 1 capsule by mouth daily.     CALCIUM-VITAMIN D PO Take 750 mg by mouth in the morning and at bedtime.     clobetasol (OLUX) 0.05 % topical foam Apply 1 application externally once a day for 7 days then m-f only 50 g 2   clobetasol (TEMOVATE) 0.05 % external solution Apply 1 Application topically 3 (three) times a week. FOAM     cycloSPORINE, PF, (CEQUA) 0.09 % SOLN Place 1 drop into both eyes 2 times daily. 180 each 3   doxycycline (MONODOX) 50 MG capsule Take 50 mg by mouth 2 (two) times daily.     erythromycin ophthalmic ointment Place into both eyes nightly. 10.5 g 3   finasteride (PROSCAR) 5 MG tablet Take 1/2 tablet by mouth once daily 15 tablet 3   gabapentin (NEURONTIN) 300 MG capsule TAKE TWO  CAPSULES BY MOUTH TWICE DAILY 360 capsule 0   hydrocortisone 2.5 % ointment Apply topically twice a day Monday through Friday only (Patient taking differently: Apply topically. Once a day) 60 g 0   Multiple Vitamins-Minerals (PRESERVISION AREDS 2) CAPS Take 1 capsule by mouth in the morning and at bedtime.     Omega-3 Fatty Acids (FISH OIL) 300 MG CAPS Take by mouth. 2000 mg/ per day     tretinoin (RETIN-A) 0.1 % cream APPLY TO FACE ONCE A DAY IN THE EVENING 20 g 5   pyridostigmine (MESTINON) 60 MG tablet Take 0.5 tablets (30 mg total) by mouth 3 (three) times daily. (Patient taking differently: Take 30 mg by mouth 3 (three) times daily. PRN) 90 tablet 5    No results found for this or any previous visit (from the past 48 hours).  No results found.    Blood pressure 101/84, pulse 73, temperature 97.9 F (36.6 C), temperature source Temporal, resp. rate 20, height 5\' 6"  (1.676 m), weight 68.5 kg, SpO2 100%.  General appearance: alert, cooperative, and appears stated age Head: Normocephalic, without obvious abnormality, atraumatic Neck: supple, symmetrical, trachea midline Extremities: Intact sensation and capillary refill all digits.  +epl/fpl/io.  No wounds.  Skin: Skin color, texture, turgor normal. No rashes or lesions Neurologic: Grossly normal Incision/Wound: none  Assessment/Plan Right dequervains tenosynovitis.  Non operative and operative treatment options have been discussed with the patient and patient wishes to proceed with operative treatment. Risks, benefits, and alternatives of surgery have been discussed and the patient agrees with the plan of care.   Betha Loa 12/02/2023, 2:38 PM

## 2023-12-02 NOTE — Anesthesia Procedure Notes (Signed)
 Procedure Name: LMA Insertion Date/Time: 12/02/2023 3:44 PM  Performed by: Yolanda Bonine, CRNAPre-anesthesia Checklist: Patient identified, Emergency Drugs available, Suction available, Patient being monitored and Timeout performed Patient Re-evaluated:Patient Re-evaluated prior to induction Oxygen Delivery Method: Circle system utilized Preoxygenation: Pre-oxygenation with 100% oxygen Induction Type: IV induction Ventilation: Mask ventilation without difficulty LMA: LMA with gastric port inserted LMA Size: 4.0 Number of attempts: 2 Placement Confirmation: positive ETCO2 Tube secured with: Tape Dental Injury: Teeth and Oropharynx as per pre-operative assessment

## 2023-12-03 ENCOUNTER — Encounter (HOSPITAL_BASED_OUTPATIENT_CLINIC_OR_DEPARTMENT_OTHER): Payer: Self-pay | Admitting: Orthopedic Surgery

## 2023-12-03 NOTE — Anesthesia Postprocedure Evaluation (Signed)
 Anesthesia Post Note  Patient: Felicia Cain  Procedure(s) Performed: RELEASE, FIRST DORSAL COMPARTMENT, HAND (Right: Hand)     Patient location during evaluation: PACU Anesthesia Type: General Level of consciousness: awake and alert Pain management: pain level controlled Vital Signs Assessment: post-procedure vital signs reviewed and stable Respiratory status: spontaneous breathing, nonlabored ventilation, respiratory function stable and patient connected to nasal cannula oxygen Cardiovascular status: blood pressure returned to baseline and stable Postop Assessment: no apparent nausea or vomiting Anesthetic complications: no  No notable events documented.  Last Vitals:  Vitals:   12/02/23 1630 12/02/23 1642  BP: 118/77 118/76  Pulse: 69 68  Resp: 19 18  Temp:  (!) 36.3 C  SpO2: 97% 98%    Last Pain:  Vitals:   12/02/23 1216  TempSrc: Temporal                 Abem Shaddix L Vivia Rosenburg

## 2023-12-06 DIAGNOSIS — Z01818 Encounter for other preprocedural examination: Secondary | ICD-10-CM

## 2024-01-04 ENCOUNTER — Other Ambulatory Visit: Payer: Self-pay | Admitting: Neurology

## 2024-01-04 DIAGNOSIS — G4733 Obstructive sleep apnea (adult) (pediatric): Secondary | ICD-10-CM

## 2024-01-04 DIAGNOSIS — G7000 Myasthenia gravis without (acute) exacerbation: Secondary | ICD-10-CM

## 2024-01-04 DIAGNOSIS — G4730 Sleep apnea, unspecified: Secondary | ICD-10-CM

## 2024-01-07 NOTE — Telephone Encounter (Signed)
 Last seen on 06/14/23 Follow up scheduled on 06/19/24

## 2024-01-12 ENCOUNTER — Telehealth: Payer: Self-pay | Admitting: Neurology

## 2024-01-13 ENCOUNTER — Ambulatory Visit: Payer: Medicare Other

## 2024-01-25 NOTE — Telephone Encounter (Signed)
 Error

## 2024-02-08 ENCOUNTER — Other Ambulatory Visit: Payer: Self-pay | Admitting: Sports Medicine

## 2024-02-08 ENCOUNTER — Ambulatory Visit
Admission: RE | Admit: 2024-02-08 | Discharge: 2024-02-08 | Disposition: A | Discharge status: Home / Self Care | Source: Ambulatory Visit | Attending: Sports Medicine | Admitting: Sports Medicine

## 2024-02-08 DIAGNOSIS — R0789 Other chest pain: Secondary | ICD-10-CM

## 2024-03-22 ENCOUNTER — Encounter: Payer: Self-pay | Admitting: Neurology

## 2024-03-22 NOTE — Progress Notes (Unsigned)
 Felicia Cain

## 2024-03-23 ENCOUNTER — Encounter: Payer: Self-pay | Admitting: Neurology

## 2024-03-23 ENCOUNTER — Telehealth: Admitting: Neurology

## 2024-03-23 DIAGNOSIS — G4730 Sleep apnea, unspecified: Secondary | ICD-10-CM

## 2024-03-23 DIAGNOSIS — G4733 Obstructive sleep apnea (adult) (pediatric): Secondary | ICD-10-CM

## 2024-03-23 NOTE — Patient Instructions (Signed)
 Osteoarthritis  Osteoarthritis is a type of arthritis. It refers to joint pain or joint disease. Osteoarthritis affects tissue that covers the ends of bones in joints (cartilage). Cartilage acts as a cushion between the bones and helps them move smoothly. Osteoarthritis occurs when cartilage in the joints gets worn down. Osteoarthritis is sometimes called wear and tear arthritis. Osteoarthritis is the most common form of arthritis. It often occurs in older people. It is a condition that gets worse over time. The joints most often affected by this condition are in the fingers, toes, hips, knees, and spine, including the neck and lower back. What are the causes? This condition is caused by the wearing down of cartilage that covers the ends of bones. What increases the risk? The following factors may make you more likely to develop this condition: Being age 33 or older. Obesity. Overuse of joints. Past injury of a joint. Past surgery on a joint. Family history of osteoarthritis. What are the signs or symptoms? The main symptoms of this condition are pain, swelling, and stiffness in the joint. Other symptoms may include: An enlarged joint. More pain and further damage caused by small pieces of bone or cartilage that break off and float inside of the joint. Small deposits of bone (osteophytes) that grow on the edges of the joint. A grating or scraping feeling inside the joint when you move it. Popping or creaking sounds when you move. Difficulty walking or exercising. An inability to grip items, twist your hand, or control the movements of your hands and fingers. How is this diagnosed? This condition may be diagnosed based on: Your medical history. A physical exam. Your symptoms. X-rays of the affected joints. Blood tests to rule out other types of arthritis. How is this treated? There is no cure for this condition, but treatment can help control pain and improve joint function. Treatment  may include a combination of therapies, such as: Pain relief techniques, such as: Applying heat and cold to the joint. Massage. A form of talk therapy called cognitive behavioral therapy (CBT). This therapy helps you set goals and follow up on the changes that you make. Medicines for pain and inflammation. The medicines can be taken by mouth or applied to the skin. They include: NSAIDs, such as ibuprofen. Prescription medicines. Strong anti-inflammatory medicines (corticosteroids). Certain nutritional supplements. A prescribed exercise program. You may work with a physical therapist. Assistive devices, such as a brace, wrap, splint, specialized glove, or cane. A weight control plan. Surgery, such as: An osteotomy. This is done to reposition the bones and relieve pain or to remove loose pieces of bone and cartilage. Joint replacement surgery. You may need this surgery if you have advanced osteoarthritis. Follow these instructions at home: Activity Rest your affected joints as told by your health care provider. Exercise as told by your provider. The provider may recommend specific types of exercise, such as: Strengthening exercises. These are done to strengthen the muscles that support joints affected by arthritis. Aerobic activities. These are exercises, such as brisk walking or water aerobics, that increase your heart rate. Range-of-motion activities. These help your joints move more easily. Balance and agility exercises. Managing pain, stiffness, and swelling     If told, apply heat to the affected area as often as told by your provider. Use the heat source that your provider recommends, such as a moist heat pack or a heating pad. If you have a removable assistive device, remove it as told by your provider. Place a  towel between your skin and the heat source. If your provider tells you to keep the assistive device on while you apply heat, place a towel between the assistive device and  the heat source. Leave the heat on for 20-30 minutes. If told, put ice on the affected area. If you have a removable assistive device, remove it as told by your provider. Put ice in a plastic bag. Place a towel between your skin and the bag. If your provider tells you to keep the assistive device on during icing, place a towel between the assistive device and the bag. Leave the ice on for 20 minutes, 2-3 times a day. If your skin turns bright red, remove the ice or heat right away to prevent skin damage. The risk of damage is higher if you cannot feel pain, heat, or cold. Move your fingers or toes often to reduce stiffness and swelling. Raise (elevate) the affected area above the level of your heart while you are sitting or lying down. General instructions Take over-the-counter and prescription medicines only as told by your provider. Maintain a healthy weight. Follow instructions from your provider for weight control. Do not use any products that contain nicotine or tobacco. These products include cigarettes, chewing tobacco, and vaping devices, such as e-cigarettes. If you need help quitting, ask your provider. Use assistive devices as told by your provider. Where to find more information [employer] of Arthritis and Musculoskeletal and Skin Diseases: niams.http://www.myers.net/ [employer] on Aging: BaseRingTones.pl American College of Rheumatology: rheumatology.org Contact a health care provider if: You have redness, swelling, or a feeling of warmth in a joint that gets worse. You have a fever along with joint or muscle aches. You develop a rash. You have trouble doing your normal activities. You have pain that gets worse and is not relieved by pain medicine. This information is not intended to replace advice given to you by your health care provider. Make sure you discuss any questions you have with your health care provider. Document Revised: 04/23/2022 Document Reviewed:  04/23/2022 Elsevier Patient Education  2024 [employer]Fatigue If you have fatigue, you feel tired all the time and have a lack of energy or a lack of motivation. Fatigue may make it difficult to start or complete tasks because of exhaustion. Occasional or mild fatigue is often a normal response to activity or life. However, long-term (chronic) or extreme fatigue may be a symptom of a medical condition such as: Depression. Not having enough red blood cells or hemoglobin in the blood (anemia). A problem with a small gland located in the lower front part of the neck (thyroid  disorder). Rheumatologic conditions. These are problems related to the body's defense system (immune system). Infections, especially certain viral infections. Fatigue can also lead to negative health outcomes over time. Follow these instructions at home: Medicines Take over-the-counter and prescription medicines only as told by your health care provider. Take a multivitamin if told by your health care provider. Do not use herbal or dietary supplements unless they are approved by your health care provider. Eating and drinking  Avoid heavy meals in the evening. Eat a well-balanced diet, which includes lean proteins, whole grains, plenty of fruits and vegetables, and low-fat dairy products. Avoid eating or drinking too many products with caffeine in them. Avoid alcohol. Drink enough fluid to keep your urine pale yellow. Activity  Exercise regularly, as told by your health care provider. Use or practice techniques to help you relax, such as yoga, tai chi,  meditation, or massage therapy. Lifestyle Change situations that cause you stress. Try to keep your work and personal schedules in balance. Do not use recreational or illegal drugs. General instructions Monitor your fatigue for any changes. Go to bed and get up at the same time every day. Avoid fatigue by pacing yourself during the day and getting enough sleep at  night. Maintain a healthy weight. Contact a health care provider if: Your fatigue does not get better. You have a fever. You suddenly lose or gain weight. You have headaches. You have trouble falling asleep or sleeping through the night. You feel angry, guilty, anxious, or sad. You have swelling in your legs or another part of your body. Get help right away if: You feel confused, feel like you might faint, or faint. Your vision is blurry or you have a severe headache. You have severe pain in your abdomen, your back, or the area between your waist and hips (pelvis). You have chest pain, shortness of breath, or an irregular or fast heartbeat. You are unable to urinate, or you urinate less than normal. You have abnormal bleeding from the rectum, nose, lungs, nipples, or, if you are female, the vagina. You vomit blood. You have thoughts about hurting yourself or others. These symptoms may be an emergency. Get help right away. Call 911. Do not wait to see if the symptoms will go away. Do not drive yourself to the hospital. Get help right away if you feel like you may hurt yourself or others, or have thoughts about taking your own life. Go to your nearest emergency room or: Call 911. Call the National Suicide Prevention Lifeline at 650-651-2584 or 988. This is open 24 hours a day. Text the Crisis Text Line at (650)195-5808. Summary If you have fatigue, you feel tired all the time and have a lack of energy or a lack of motivation. Fatigue may make it difficult to start or complete tasks because of exhaustion. Long-term (chronic) or extreme fatigue may be a symptom of a medical condition. Exercise regularly, as told by your health care provider. Change situations that cause you stress. Try to keep your work and personal schedules in balance. This information is not intended to replace advice given to you by your health care provider. Make sure you discuss any questions you have with your health  care provider. Document Revised: 06/16/2021 Document Reviewed: 06/16/2021 Elsevier Patient Education  2024 [employer].

## 2024-03-23 NOTE — Progress Notes (Addendum)
 Virtual Visit via Video Note  I connected with Felicia  S Cain on 03/23/24 at  9:30 AM EDT by a video enabled telemedicine application and verified that I am speaking with the correct person using two identifiers.  Location: Patient: at home  Provider: in te office    I discussed the limitations of evaluation and management by telemedicine and the availability of in person appointments. The patient expressed understanding and agreed to proceed.  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   also had long term pain after hip fracture and repair.   Sarcopenia.  Hand pain, stiffness, rigidity reported.   Exercise  has often helped but made her worse,   She has concentrated on isometrics.     Observations/Objective: reports improvement of sleepiness when using CPAP>      Assessment and Plan: OSA and CPAP- Rv in 12 months  Follow  with Dr Marci : [facility].  Parkinson's disease without dyskinesia or fluctuating manifestations (Primary Dx);  Hypophonia    Follow Up Instructions: No specific instructions.     I discussed the assessment and treatment plan with the patient. The patient was provided an opportunity to ask questions and all were answered. The patient agreed with the plan and demonstrated an understanding of the instructions.   The patient was advised to call back or seek an in-person evaluation if the symptoms worsen or if the condition fails to improve as anticipated.  I provided 20 minutes of non-face-to-face time during this encounter.   Dedra Gores, MD

## 2024-03-23 NOTE — Telephone Encounter (Signed)
 Pt called to confirm Dr Chalice will soon be logging on for her my chart vv

## 2024-04-14 ENCOUNTER — Encounter (INDEPENDENT_AMBULATORY_CARE_PROVIDER_SITE_OTHER): Payer: Self-pay

## 2024-05-30 ENCOUNTER — Institutional Professional Consult (permissible substitution) (INDEPENDENT_AMBULATORY_CARE_PROVIDER_SITE_OTHER): Admitting: Otolaryngology

## 2024-06-16 NOTE — Progress Notes (Signed)
 SABRA

## 2024-06-19 ENCOUNTER — Encounter (INDEPENDENT_AMBULATORY_CARE_PROVIDER_SITE_OTHER): Payer: Self-pay | Admitting: Otolaryngology

## 2024-06-19 ENCOUNTER — Ambulatory Visit (INDEPENDENT_AMBULATORY_CARE_PROVIDER_SITE_OTHER): Admitting: Otolaryngology

## 2024-06-19 ENCOUNTER — Encounter: Payer: Self-pay | Admitting: Neurology

## 2024-06-19 ENCOUNTER — Ambulatory Visit: Payer: Medicare Other | Admitting: Neurology

## 2024-06-19 VITALS — BP 104/69 | HR 73 | Temp 98.2°F

## 2024-06-19 VITALS — BP 116/70 | HR 67 | Ht 66.0 in | Wt 148.8 lb

## 2024-06-19 DIAGNOSIS — G4733 Obstructive sleep apnea (adult) (pediatric): Secondary | ICD-10-CM

## 2024-06-19 DIAGNOSIS — K219 Gastro-esophageal reflux disease without esophagitis: Secondary | ICD-10-CM

## 2024-06-19 DIAGNOSIS — J3089 Other allergic rhinitis: Secondary | ICD-10-CM

## 2024-06-19 DIAGNOSIS — R0981 Nasal congestion: Secondary | ICD-10-CM

## 2024-06-19 DIAGNOSIS — R1310 Dysphagia, unspecified: Secondary | ICD-10-CM

## 2024-06-19 DIAGNOSIS — J3489 Other specified disorders of nose and nasal sinuses: Secondary | ICD-10-CM

## 2024-06-19 DIAGNOSIS — G4730 Sleep apnea, unspecified: Secondary | ICD-10-CM

## 2024-06-19 DIAGNOSIS — J343 Hypertrophy of nasal turbinates: Secondary | ICD-10-CM

## 2024-06-19 DIAGNOSIS — R0982 Postnasal drip: Secondary | ICD-10-CM

## 2024-06-19 MED ORDER — PYRIDOSTIGMINE BROMIDE 60 MG PO TABS: 30.0000 mg | ORAL_TABLET | Freq: Three times a day (TID) | ORAL | 1 refills | Status: AC

## 2024-06-19 MED ORDER — FLUTICASONE PROPIONATE 50 MCG/ACT NA SUSP: 2.0000 | Freq: Two times a day (BID) | NASAL | 6 refills | Status: AC

## 2024-06-19 MED ORDER — MODAFINIL 100 MG PO TABS: 100.0000 mg | ORAL_TABLET | Freq: Every day | ORAL | 5 refills | Status: AC

## 2024-06-19 MED ORDER — LEVOCETIRIZINE DIHYDROCHLORIDE 5 MG PO TABS: 5.0000 mg | ORAL_TABLET | Freq: Every evening | ORAL | 3 refills | Status: AC
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
## 2024-06-19 NOTE — Patient Instructions (Signed)
 Glutathione supplementation is of great interest to me.

## 2024-06-19 NOTE — Patient Instructions (Signed)

## 2024-06-19 NOTE — Progress Notes (Signed)
 ENT CONSULT:  Reason for Consult: chronic dysphagia and chronic nasal obstruction   HPI: Discussed the use of AI scribe software for clinical note transcription with the patient, who gave verbal consent to proceed.  History of Present Illness Felicia  S Cain is a 68 year old female with hx of Parkinson's disease who presents with difficulty swallowing and regurgitation.  She has a long-standing issue with swallowing that has recently worsened, characterized by projectile vomiting of undigested food several hours after eating, particularly at night, without associated pain. There is a sensation of food staying in her esophagus, and she fears bending over as it may cause regurgitation. The regurgitated food is initially tasteless and sour, becoming bitter towards the end of the episode.  She has a history of esophageal issues, including a prominent aortic arch noted on a previous esophagram, which causes pills to feel stuck and painful to swallow. She has undergone esophagrams and endoscopies in the past, with dilatation procedures providing temporary relief. However, recent endoscopies have not shown strictures. Esophagram in the past showed GERD as well.   She has been on a juice diet recommended by specialists to improve her microbiome, following a stool microbiome test that indicated fat malabsorption and possible inflammation. Despite this, her symptoms have worsened. She has tried Creon without improvement.  She uses a CPAP nasal mask and experiences chronic nasal congestion, particularly in left nostril, which worsens during certain yoga poses. She has not been using any nasal sprays or allergy medications.  She is not on prescribed medication for Parkinson's disease but takes a medication which contains 120 mg of L-DOPA, three times a day. She has previously been on Humira for severe earache and has experienced internal shingles. No heartburn or discomfort associated with regurgitation.  Reports nasal congestion. Experiences painful swallowing and regurgitation of pills.  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    also had long term pain after hip fracture and repair.   Sarcopenia.  Hand pain, stiffness, rigidity reported.   Exercise  has often helped but made her worse,   She has concentrated on isometrics.     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   Esophageal stricture    stricture with dysphasia   GERD (gastroesophageal reflux disease)    H/O Doppler ultrasound 2006   see scanned study   H/O echocardiogram 2004, 2013   Hepatitis A 1961   [place]   History of cardiac monitoring 2006   History of nuclear stress test 2004   see scanned study   Myasthenia gravis (HCC)    occular   Myasthenia gravis in crisis (HCC) 04/2014   respiratory crisis   NAION (non-arteritic anterior ischemic optic neuropathy), right    Near syncope    Neuromuscular disorder (HCC)    Neuropathy    Optic neuritis    ischaemic optic neuritis, non arteric   Optic neuropathy    PONV (postoperative nausea and vomiting)    Posterior optic neuritis    Ptosis of eyelid  Sleep apnea    I wear Moses device; I don't wear CPAP (01/25/2017)   Spinal stenosis     Past Surgical History:  Procedure Laterality Date   AUGMENTATION MAMMAPLASTY     2000, after breast cancer surgery,redone 2010   BREAST BIOPSY     BREAST CAPSULECTOMY WITH IMPLANT EXCHANGE Bilateral 09/28/2018   Procedure: REMOVAL OF BILATERAL BREAST IMPLANTS WITH CAPSULECTOMIES AND REPLACEMENTS OF IMPLANTS;  Surgeon: Lowery Estefana RAMAN, DO;  Location: [facility];  Service: Plastics;  Laterality: Bilateral;   CARPAL TUNNEL RELEASE Bilateral    CESAREAN SECTION  1989   DORSAL COMPARTMENT RELEASE Right 12/02/2023   Procedure: RELEASE, FIRST DORSAL COMPARTMENT, HAND;  Surgeon: Murrell Drivers, MD;  Location: [facility];  Service: Orthopedics;  Laterality: Right;   ESOPHAGOGASTRODUODENOSCOPY (EGD) WITH ESOPHAGEAL DILATION     HIP PINNING,CANNULATED Right 01/26/2017   Procedure: CANNULATED SCREWS/RIGHT HIP PINNING;  Surgeon: Vernetta Lonni GRADE, MD;  Location: [facility];  Service: Orthopedics;  Laterality: Right;   INNER EAR SURGERY Right 1995   LUMBAR LAMINECTOMY Right 2000   Dr Unice   MASTECTOMY Bilateral     Family History  Problem Relation Age of Onset   Stroke Mother    Hypertension Mother    Early death Father    Cancer Father 31       sarcoma   Parkinson's disease Maternal Aunt    Cancer Maternal Uncle    Cancer Paternal Grandmother        breast    Social History:  reports that she has never smoked. She has never used smokeless tobacco. She reports that she does not drink alcohol and does not use drugs.  Allergies:  Allergies  Allergen Reactions   Other Other (See Comments) and Rash    blisters  Blistering on site, can only use paper tape or surgical.   Tape Rash and Other (See Comments)     Blistering on site, can only use paper tape or surgical.   Dermatitis Antigen Dermatitis    Blistering on site, can only use paper tape or surgical.,  Blistering on site, can only use paper tape or surgical.    Blistering on site, can only use paper tape or surgical.   Adderall [Amphetamine-Dextroamphetamine] Other (See Comments)    headache   Latex Itching and Swelling    Medications: I have reviewed the patient's current medications.  The PMH, PSH, Medications, Allergies, and SH were reviewed and updated.  ROS: Constitutional: Negative for fever, weight loss and weight  gain. Cardiovascular: Negative for chest pain and dyspnea on exertion. Respiratory: Is not experiencing shortness of breath at rest. Gastrointestinal: Negative for nausea and vomiting. Neurological: Negative for headaches. Psychiatric: The patient is not nervous/anxious  Blood pressure 104/69, pulse 73, temperature 98.2 F (36.8 C), SpO2 94%. There is no height or weight on file to calculate BMI.  PHYSICAL EXAM:  Exam: General: Well-developed, well-nourished Respiratory Respiratory effort: Equal inspiration and expiration without stridor Cardiovascular Peripheral Vascular: Warm extremities with equal color/perfusion Eyes: No nystagmus with equal extraocular motion bilaterally Neuro/Psych/Balance: Patient oriented to person, place, and time; Appropriate mood and affect; Gait is intact with no imbalance; Cranial nerves I-XII are intact Head and Face Inspection: Normocephalic and atraumatic without mass or lesion Palpation: Facial skeleton intact without bony stepoffs Salivary Glands: No mass or tenderness Facial Strength: Facial motility symmetric and full bilaterally ENT Pinna: External ear intact and fully developed External canal: Canal is patent with intact skin  Tympanic Membrane: Clear and mobile External Nose: No scar or anatomic deformity Internal Nose: Septum is straight. No polyp, or purulence. Mucosal edema and erythema present.  Bilateral inferior turbinate hypertrophy.  Lips, Teeth, and gums: Mucosa and teeth intact and viable TMJ: No pain to palpation with full mobility Oral cavity/oropharynx: No erythema or exudate, no lesions present Nasopharynx: No mass or lesion with intact mucosa Hypopharynx: Intact mucosa without pooling of secretions Larynx Glottic: Full true vocal cord mobility without lesion or mass Supraglottic: Normal appearing epiglottis and AE folds Interarytenoid Space: Moderate pachydermia&edema Subglottic Space: Patent without lesion or  edema Neck Neck and Trachea: Midline trachea without mass or lesion Thyroid : No mass or nodularity Lymphatics: No lymphadenopathy  Procedure: Preoperative diagnosis: dysphagia regurgitation   Postoperative diagnosis:   Same + GERD LPR  Procedure: Flexible fiberoptic laryngoscopy  Surgeon: Elena Larry, MD  Anesthesia: Topical lidocaine  and Afrin Complications: None Condition is stable throughout exam  Indications and consent:  The patient presents to the clinic with above symptoms. Indirect laryngoscopy view was incomplete. Thus it was recommended that they undergo a flexible fiberoptic laryngoscopy. All of the risks, benefits, and potential complications were reviewed with the patient preoperatively and verbal informed consent was obtained.  Procedure: The patient was seated upright in the clinic. Topical lidocaine  and Afrin were applied to the nasal cavity. After adequate anesthesia had occurred, I then proceeded to pass the flexible telescope into the nasal cavity. The nasal cavity was patent without rhinorrhea or polyp. The nasopharynx was also patent without mass or lesion. The base of tongue was visualized and was normal. There were no signs of pooling of secretions in the piriform sinuses. The true vocal folds were mobile bilaterally. There were no signs of glottic or supraglottic mucosal lesion or mass. There was moderate interarytenoid pachydermia and post cricoid edema. The telescope was then slowly withdrawn and the patient tolerated the procedure throughout.  .  Studies Reviewed: Esophagram 06/22/22 IMPRESSION: 1. Mild to moderate gastroesophageal reflux elicited. No hiatal hernia. 2. Otherwise normal esophagram. No evidence of esophageal mass or stricture. Normal esophageal motility.  Assessment/Plan: Encounter Diagnoses  Name Primary?   Dysphagia, unspecified type Yes   Nasal obstruction    Post-nasal drip    Environmental and seasonal allergies    Hypertrophy  of both inferior nasal turbinates    Chronic nasal congestion    Chronic GERD     Assessment and Plan Assessment & Plan Dysphagia with regurgitation Chronic dysphagia with regurgitation, worsened recently, sensation of food getting stuck mid esophagus. Projectile vomiting without pain and sensation of food retention in the esophagus. Differential includes esophageal spasm/esophageal dysphagia or GERD-related issues. She has EGD scheduled for 10/22. Last esophagram 2023, with GERD otherwise normal - Order modified barium swallow study with speech therapist. MBS + esophagram - Schedule follow-up after swallow study results.  Gastroesophageal reflux disease (GERD) GERD with reflux shown on previous esophagram. Symptoms include regurgitation and sensation of food sticking in the esophagus. Previous PPI use was ineffective for throat symptoms. GERD may contribute to esophageal spasms. -  Reflux Gourmet after meals - diet and lifestyle changes to minimize GERD - Refer to Jamie Koufman's blog for dietary and lifestyle modifications/reflux cook book  Chronic Nasal congestion Suspected environmental allergies  Chronic nasal congestion, possibly exacerbated by CPAP use. - Prescribed Flonase nasal spray. - Prescribed Xyzal for allergies. - will consider allergy testing in the future    Thank you for allowing me to participate in the care of  this patient. Please do not hesitate to contact me with any questions or concerns.   Elena Larry, MD Otolaryngology [facility] Phone: [phone] Fax: [phone]    06/19/2024, 11:57 AM

## 2024-06-19 NOTE — Addendum Note (Signed)
 Addended by: CHALICE SAUNAS on: 06/19/2024 03:53 PM   Modules accepted: Orders

## 2024-06-20 ENCOUNTER — Telehealth (HOSPITAL_COMMUNITY): Payer: Self-pay | Admitting: *Deleted

## 2024-06-20 NOTE — Telephone Encounter (Signed)
 Attempted to contact pt at [phone], left detailed VM and request for call back (AHARRIS)

## 2024-06-23 ENCOUNTER — Other Ambulatory Visit (HOSPITAL_COMMUNITY): Payer: Self-pay | Admitting: *Deleted

## 2024-06-23 DIAGNOSIS — R059 Cough, unspecified: Secondary | ICD-10-CM

## 2024-06-23 DIAGNOSIS — R1310 Dysphagia, unspecified: Secondary | ICD-10-CM

## 2024-06-27 ENCOUNTER — Telehealth (INDEPENDENT_AMBULATORY_CARE_PROVIDER_SITE_OTHER): Payer: Self-pay

## 2024-06-27 NOTE — Telephone Encounter (Signed)
 Called and spoke with patient informed patient that Dr Okey is no longer with office. Offered to r/s patient with another provider. Pt said that she didn't want to r/s her appt and that she would call back.

## 2024-06-28 LAB — HM COLONOSCOPY

## 2024-07-06 ENCOUNTER — Other Ambulatory Visit: Payer: Self-pay | Admitting: Medical Genetics

## 2024-07-06 DIAGNOSIS — Z006 Encounter for examination for normal comparison and control in clinical research program: Secondary | ICD-10-CM

## 2024-07-10 ENCOUNTER — Ambulatory Visit (HOSPITAL_COMMUNITY): Admission: RE | Admit: 2024-07-10 | Discharge: 2024-07-10 | Disposition: A | Source: Ambulatory Visit

## 2024-07-10 ENCOUNTER — Ambulatory Visit (HOSPITAL_COMMUNITY)
Admission: RE | Admit: 2024-07-10 | Discharge: 2024-07-10 | Disposition: A | Discharge status: Home / Self Care | Source: Ambulatory Visit | Attending: Otolaryngology | Admitting: Otolaryngology

## 2024-07-10 DIAGNOSIS — K219 Gastro-esophageal reflux disease without esophagitis: Secondary | ICD-10-CM | POA: Insufficient documentation

## 2024-07-10 DIAGNOSIS — G20A1 Parkinson's disease without dyskinesia, without mention of fluctuations: Secondary | ICD-10-CM | POA: Insufficient documentation

## 2024-07-10 DIAGNOSIS — K224 Dyskinesia of esophagus: Secondary | ICD-10-CM | POA: Diagnosis not present

## 2024-07-10 DIAGNOSIS — R09A2 Foreign body sensation, throat: Secondary | ICD-10-CM | POA: Diagnosis not present

## 2024-07-10 DIAGNOSIS — R1310 Dysphagia, unspecified: Secondary | ICD-10-CM

## 2024-07-10 DIAGNOSIS — R059 Cough, unspecified: Secondary | ICD-10-CM

## 2024-07-10 DIAGNOSIS — R1312 Dysphagia, oropharyngeal phase: Secondary | ICD-10-CM | POA: Diagnosis not present

## 2024-07-10 NOTE — Progress Notes (Signed)
 Modified Barium Swallow Study  Patient Details  Name: Felicia Cain  ARIONNE IAMS MRN: [id number] Date of Birth: [birth year]  Today's Date: 07/10/2024  Modified Barium Swallow completed.  Full report located under Chart Review in the Imaging Section.  History of Present Illness Patient is a 68 year old female with hx of Parkinson's disease who presents with difficulty swallowing and regurgitation per ENT consult note. She has a long standing issue with swallowing that has recently worsened, characterized by projectile vomiting of undigested food several hours after eating, particularly at night, without associated pain. She notes a sensation of food staying in her esophagus. Patient has a history of esophageal issues, including a prominent aortic arch noted on a previous esophagram, which causes patient's pills to feel stuck and painful to swallow. Patient has undergone esophagrams and endoscopies in the past, with dilatation procedures providing temporary relief. However, recent endoscopies have not shown strictures. Esophagram in the past showed GERD as well. Patient has been on a juice diet recommended by specialists to improve her microbiome. Despite this, her symptoms have worsened. Patient notes no heartburn or discomfort associated with regurgitation. She reports painful swallowing and regurgitation of pills. MBS ordered to rule out aspiration.   Clinical Impression (P) Patient presents with an oropharyngeal dysphagia per this MBS. Two instances of flash penetration to the vocal folds were observed with thin liquids but were ejected out  No aspiration was observed throughout the study. The penetration appeared to be due to patient's mistiming of laryngeal closure. Patient's oral impairments include a delayed swallow initiation to the valleculae. Patient's pharyngeal impairments include incomplete laryngeal vestibule closure and trace residuals on the valleculae. SLP not recommending any follow up.  DIGEST  Swallow Severity Rating*  Safety: 1  Efficiency: 1  Overall Pharyngeal Swallow Severity: 1 1: mild; 2: moderate; 3: severe; 4: profound  *The Dynamic Imaging Grade of Swallowing Toxicity is standardized for the head and neck cancer population, however, demonstrates promising clinical applications across populations to standardize the clinical rating of pharyngeal swallow safety and severity.   Swallow Evaluation Recommendations Recommendations: (P) PO diet PO Diet Recommendation: (P) Regular;Thin liquids (Level 0) Liquid Administration via: (P) Spoon;Cup;Straw Medication Administration: (P) Other (Comment) (As tolerated.) Supervision: (P) Patient able to self-feed Swallowing strategies  : (P) Slow rate;Small bites/sips Postural changes: (P) Position pt fully upright for meals;Stay upright 30-60 min after meals Oral care recommendations: (P) Oral care BID (2x/day)   Damien Hy  Graduate SLP Clinican

## 2024-08-21 ENCOUNTER — Ambulatory Visit (INDEPENDENT_AMBULATORY_CARE_PROVIDER_SITE_OTHER): Admitting: Otolaryngology

## 2024-08-22 ENCOUNTER — Other Ambulatory Visit: Payer: Self-pay | Admitting: Neurology

## 2024-08-22 ENCOUNTER — Ambulatory Visit (INDEPENDENT_AMBULATORY_CARE_PROVIDER_SITE_OTHER): Admitting: Otolaryngology

## 2024-08-23 ENCOUNTER — Other Ambulatory Visit: Payer: Self-pay | Admitting: Neurology

## 2024-08-23 MED ORDER — GABAPENTIN 300 MG PO CAPS: 600.0000 mg | ORAL_CAPSULE | Freq: Two times a day (BID) | ORAL | 1 refills | Status: AC

## 2024-09-21 ENCOUNTER — Other Ambulatory Visit: Payer: Self-pay | Admitting: Neurology

## 2024-09-22 NOTE — Telephone Encounter (Signed)
 Pt last seen 06-19-24, do you want to continue?

## 2025-06-25 ENCOUNTER — Ambulatory Visit: Admitting: Neurology
